# Patient Record
Sex: Female | Born: 1935 | Race: Black or African American | Hispanic: No | Marital: Single | Smoking: Never smoker
Health system: Southern US, Community
[De-identification: ages and names within clinical notes are randomized; demographics above are authoritative.]

## PROBLEM LIST (undated history)

## (undated) DIAGNOSIS — E1129 Type 2 diabetes mellitus with other diabetic kidney complication: Secondary | ICD-10-CM

## (undated) DIAGNOSIS — M1990 Unspecified osteoarthritis, unspecified site: Secondary | ICD-10-CM

## (undated) DIAGNOSIS — R0989 Other specified symptoms and signs involving the circulatory and respiratory systems: Secondary | ICD-10-CM

## (undated) DIAGNOSIS — R0600 Dyspnea, unspecified: Secondary | ICD-10-CM

## (undated) DIAGNOSIS — I2510 Atherosclerotic heart disease of native coronary artery without angina pectoris: Secondary | ICD-10-CM

## (undated) DIAGNOSIS — D631 Anemia in chronic kidney disease: Secondary | ICD-10-CM

## (undated) DIAGNOSIS — N189 Chronic kidney disease, unspecified: Secondary | ICD-10-CM

## (undated) DIAGNOSIS — G629 Polyneuropathy, unspecified: Secondary | ICD-10-CM

## (undated) DIAGNOSIS — R260 Ataxic gait: Secondary | ICD-10-CM

## (undated) DIAGNOSIS — I119 Hypertensive heart disease without heart failure: Secondary | ICD-10-CM

## (undated) DIAGNOSIS — F0390 Unspecified dementia without behavioral disturbance: Secondary | ICD-10-CM

## (undated) DIAGNOSIS — N186 End stage renal disease: Secondary | ICD-10-CM

## (undated) DIAGNOSIS — Z8489 Family history of other specified conditions: Secondary | ICD-10-CM

## (undated) DIAGNOSIS — I5032 Chronic diastolic (congestive) heart failure: Secondary | ICD-10-CM

## (undated) DIAGNOSIS — I10 Essential (primary) hypertension: Secondary | ICD-10-CM

## (undated) DIAGNOSIS — E785 Hyperlipidemia, unspecified: Secondary | ICD-10-CM

## (undated) DIAGNOSIS — Z992 Dependence on renal dialysis: Secondary | ICD-10-CM

## (undated) DIAGNOSIS — D509 Iron deficiency anemia, unspecified: Secondary | ICD-10-CM

## (undated) HISTORY — PX: EYE SURGERY: SHX253

## (undated) HISTORY — DX: Iron deficiency anemia, unspecified: D50.9

## (undated) HISTORY — DX: Essential (primary) hypertension: I10

## (undated) HISTORY — PX: CATARACT EXTRACTION: SUR2

## (undated) HISTORY — PX: FEMUR FRACTURE SURGERY: SHX633

## (undated) HISTORY — DX: Polyneuropathy, unspecified: G62.9

---

## 2001-04-16 ENCOUNTER — Ambulatory Visit (HOSPITAL_COMMUNITY): Admission: RE | Admit: 2001-04-16 | Discharge: 2001-04-16 | Payer: Self-pay | Admitting: Internal Medicine

## 2001-04-16 ENCOUNTER — Encounter: Payer: Self-pay | Admitting: Internal Medicine

## 2003-08-04 ENCOUNTER — Ambulatory Visit (HOSPITAL_COMMUNITY): Admission: RE | Admit: 2003-08-04 | Discharge: 2003-08-04 | Payer: Self-pay | Admitting: Internal Medicine

## 2004-01-03 ENCOUNTER — Ambulatory Visit (HOSPITAL_COMMUNITY): Admission: RE | Admit: 2004-01-03 | Discharge: 2004-01-03 | Payer: Self-pay | Admitting: Internal Medicine

## 2005-01-02 ENCOUNTER — Emergency Department (HOSPITAL_COMMUNITY): Admission: EM | Admit: 2005-01-02 | Discharge: 2005-01-02 | Payer: Self-pay | Admitting: Emergency Medicine

## 2005-05-08 ENCOUNTER — Ambulatory Visit (HOSPITAL_COMMUNITY): Admission: RE | Admit: 2005-05-08 | Discharge: 2005-05-08 | Payer: Self-pay | Admitting: Internal Medicine

## 2005-06-04 ENCOUNTER — Ambulatory Visit (HOSPITAL_COMMUNITY): Admission: RE | Admit: 2005-06-04 | Discharge: 2005-06-04 | Payer: Self-pay | Admitting: Internal Medicine

## 2005-09-05 ENCOUNTER — Ambulatory Visit (HOSPITAL_COMMUNITY): Admission: RE | Admit: 2005-09-05 | Discharge: 2005-09-05 | Payer: Self-pay | Admitting: Internal Medicine

## 2006-03-31 HISTORY — PX: CORONARY ARTERY BYPASS GRAFT: SHX141

## 2006-04-23 ENCOUNTER — Ambulatory Visit (HOSPITAL_COMMUNITY): Admission: RE | Admit: 2006-04-23 | Discharge: 2006-04-23 | Payer: Self-pay | Admitting: Internal Medicine

## 2006-08-03 ENCOUNTER — Emergency Department (HOSPITAL_COMMUNITY): Admission: EM | Admit: 2006-08-03 | Discharge: 2006-08-03 | Payer: Self-pay | Admitting: Emergency Medicine

## 2006-09-03 ENCOUNTER — Encounter (HOSPITAL_COMMUNITY): Admission: RE | Admit: 2006-09-03 | Discharge: 2006-10-03 | Payer: Self-pay | Admitting: Orthopaedic Surgery

## 2006-10-05 ENCOUNTER — Encounter (HOSPITAL_COMMUNITY): Admission: RE | Admit: 2006-10-05 | Discharge: 2006-11-04 | Payer: Self-pay | Admitting: Orthopaedic Surgery

## 2006-11-23 ENCOUNTER — Encounter (HOSPITAL_COMMUNITY): Admission: RE | Admit: 2006-11-23 | Discharge: 2006-12-23 | Payer: Self-pay | Admitting: Orthopaedic Surgery

## 2007-01-11 ENCOUNTER — Ambulatory Visit (HOSPITAL_COMMUNITY): Admission: RE | Admit: 2007-01-11 | Discharge: 2007-01-11 | Payer: Self-pay | Admitting: *Deleted

## 2007-01-20 ENCOUNTER — Ambulatory Visit: Payer: Self-pay | Admitting: Thoracic Surgery (Cardiothoracic Vascular Surgery)

## 2007-01-22 ENCOUNTER — Ambulatory Visit: Payer: Self-pay | Admitting: Surgery

## 2007-01-22 ENCOUNTER — Inpatient Hospital Stay (HOSPITAL_COMMUNITY): Admission: AD | Admit: 2007-01-22 | Discharge: 2007-01-26 | Payer: Self-pay | Admitting: *Deleted

## 2007-02-19 ENCOUNTER — Encounter: Admission: RE | Admit: 2007-02-19 | Discharge: 2007-02-19 | Payer: Self-pay | Admitting: Cardiothoracic Surgery

## 2007-02-19 ENCOUNTER — Ambulatory Visit: Payer: Self-pay | Admitting: Cardiothoracic Surgery

## 2007-03-01 ENCOUNTER — Encounter (HOSPITAL_COMMUNITY): Admission: RE | Admit: 2007-03-01 | Discharge: 2007-03-31 | Payer: Self-pay | Admitting: *Deleted

## 2007-03-15 ENCOUNTER — Ambulatory Visit (HOSPITAL_COMMUNITY): Admission: RE | Admit: 2007-03-15 | Discharge: 2007-03-15 | Payer: Self-pay | Admitting: Internal Medicine

## 2007-03-29 ENCOUNTER — Encounter (HOSPITAL_COMMUNITY): Admission: RE | Admit: 2007-03-29 | Discharge: 2007-03-31 | Payer: Self-pay | Admitting: Orthopaedic Surgery

## 2007-04-02 ENCOUNTER — Encounter (HOSPITAL_COMMUNITY): Admission: RE | Admit: 2007-04-02 | Discharge: 2007-05-02 | Payer: Self-pay | Admitting: *Deleted

## 2007-04-05 ENCOUNTER — Encounter (HOSPITAL_COMMUNITY): Admission: RE | Admit: 2007-04-05 | Discharge: 2007-05-05 | Payer: Self-pay | Admitting: Orthopaedic Surgery

## 2007-05-03 ENCOUNTER — Encounter (HOSPITAL_COMMUNITY): Admission: RE | Admit: 2007-05-03 | Discharge: 2007-06-02 | Payer: Self-pay | Admitting: *Deleted

## 2007-05-11 ENCOUNTER — Encounter (HOSPITAL_COMMUNITY): Admission: RE | Admit: 2007-05-11 | Discharge: 2007-06-10 | Payer: Self-pay | Admitting: Orthopaedic Surgery

## 2007-06-14 ENCOUNTER — Encounter (HOSPITAL_COMMUNITY): Admission: RE | Admit: 2007-06-14 | Discharge: 2007-07-14 | Payer: Self-pay | Admitting: Orthopaedic Surgery

## 2007-07-21 ENCOUNTER — Encounter (HOSPITAL_COMMUNITY): Admission: RE | Admit: 2007-07-21 | Discharge: 2007-08-20 | Payer: Self-pay | Admitting: Orthopaedic Surgery

## 2008-04-20 ENCOUNTER — Ambulatory Visit (HOSPITAL_COMMUNITY): Admission: RE | Admit: 2008-04-20 | Discharge: 2008-04-20 | Payer: Self-pay | Admitting: Internal Medicine

## 2008-06-12 ENCOUNTER — Ambulatory Visit (HOSPITAL_COMMUNITY): Admission: RE | Admit: 2008-06-12 | Discharge: 2008-06-12 | Payer: Self-pay | Admitting: Nephrology

## 2009-01-30 ENCOUNTER — Ambulatory Visit (HOSPITAL_COMMUNITY): Admission: RE | Admit: 2009-01-30 | Discharge: 2009-01-30 | Payer: Self-pay | Admitting: Cardiology

## 2009-07-23 ENCOUNTER — Ambulatory Visit (HOSPITAL_COMMUNITY): Admission: RE | Admit: 2009-07-23 | Discharge: 2009-07-23 | Payer: Self-pay | Admitting: Internal Medicine

## 2009-08-23 ENCOUNTER — Ambulatory Visit (HOSPITAL_COMMUNITY): Admission: RE | Admit: 2009-08-23 | Discharge: 2009-08-23 | Payer: Self-pay | Admitting: Internal Medicine

## 2009-09-06 ENCOUNTER — Ambulatory Visit (HOSPITAL_COMMUNITY): Payer: Self-pay | Admitting: Oncology

## 2009-09-06 ENCOUNTER — Encounter (HOSPITAL_COMMUNITY): Admission: RE | Admit: 2009-09-06 | Discharge: 2009-10-06 | Payer: Self-pay | Admitting: Oncology

## 2009-10-15 ENCOUNTER — Encounter (HOSPITAL_COMMUNITY): Admission: RE | Admit: 2009-10-15 | Discharge: 2009-11-14 | Payer: Self-pay | Admitting: Oncology

## 2009-10-22 ENCOUNTER — Ambulatory Visit (HOSPITAL_COMMUNITY): Payer: Self-pay | Admitting: Oncology

## 2009-12-12 ENCOUNTER — Ambulatory Visit (HOSPITAL_COMMUNITY): Payer: Self-pay | Admitting: Oncology

## 2010-01-02 ENCOUNTER — Encounter (HOSPITAL_COMMUNITY)
Admission: RE | Admit: 2010-01-02 | Discharge: 2010-02-01 | Payer: Self-pay | Source: Home / Self Care | Admitting: Oncology

## 2010-02-04 ENCOUNTER — Encounter (HOSPITAL_COMMUNITY)
Admission: RE | Admit: 2010-02-04 | Discharge: 2010-03-06 | Payer: Self-pay | Source: Home / Self Care | Admitting: Oncology

## 2010-02-04 ENCOUNTER — Ambulatory Visit (HOSPITAL_COMMUNITY): Payer: Self-pay | Admitting: Oncology

## 2010-02-18 ENCOUNTER — Encounter (HOSPITAL_COMMUNITY)
Admission: RE | Admit: 2010-02-18 | Discharge: 2010-03-20 | Payer: Self-pay | Source: Home / Self Care | Attending: Nephrology | Admitting: Nephrology

## 2010-02-18 ENCOUNTER — Encounter: Payer: Self-pay | Admitting: Cardiovascular Disease

## 2010-02-19 ENCOUNTER — Ambulatory Visit (HOSPITAL_COMMUNITY): Payer: Self-pay | Admitting: Nephrology

## 2010-02-20 ENCOUNTER — Encounter: Payer: Self-pay | Admitting: Cardiovascular Disease

## 2010-02-20 ENCOUNTER — Ambulatory Visit: Payer: Self-pay

## 2010-03-21 ENCOUNTER — Ambulatory Visit (HOSPITAL_COMMUNITY): Payer: Self-pay | Admitting: Oncology

## 2010-03-21 ENCOUNTER — Encounter (HOSPITAL_COMMUNITY)
Admission: RE | Admit: 2010-03-21 | Discharge: 2010-04-20 | Payer: Self-pay | Source: Home / Self Care | Attending: Oncology | Admitting: Oncology

## 2010-04-17 LAB — CBC
HCT: 32.6 % — ABNORMAL LOW (ref 36.0–46.0)
Hemoglobin: 10.5 g/dL — ABNORMAL LOW (ref 12.0–15.0)
MCH: 26.3 pg (ref 26.0–34.0)
MCHC: 32.2 g/dL (ref 30.0–36.0)
MCV: 81.7 fL (ref 78.0–100.0)
Platelets: 181 10*3/uL (ref 150–400)
RBC: 3.99 MIL/uL (ref 3.87–5.11)
RDW: 18.1 % — ABNORMAL HIGH (ref 11.5–15.5)
WBC: 5.4 10*3/uL (ref 4.0–10.5)

## 2010-04-21 ENCOUNTER — Encounter: Payer: Self-pay | Admitting: Internal Medicine

## 2010-04-29 ENCOUNTER — Encounter (HOSPITAL_COMMUNITY)
Admission: RE | Admit: 2010-04-29 | Discharge: 2010-04-30 | Payer: Self-pay | Source: Home / Self Care | Attending: Oncology | Admitting: Oncology

## 2010-04-30 NOTE — Miscellaneous (Signed)
Summary: Orders Update  Clinical Lists Changes  Problems: Added new problem of CHRONIC KIDNEY DISEASE UNSPECIFIED (ICD-585.9) Orders: Added new Test order of Renal Artery Duplex (Renal Artery Duplex) - Signed

## 2010-05-13 ENCOUNTER — Encounter (HOSPITAL_COMMUNITY): Payer: Medicare Other | Admitting: Oncology

## 2010-05-13 ENCOUNTER — Encounter (HOSPITAL_COMMUNITY): Payer: Medicare Other | Attending: Oncology

## 2010-05-13 DIAGNOSIS — I10 Essential (primary) hypertension: Secondary | ICD-10-CM | POA: Insufficient documentation

## 2010-05-13 DIAGNOSIS — E119 Type 2 diabetes mellitus without complications: Secondary | ICD-10-CM | POA: Insufficient documentation

## 2010-05-13 DIAGNOSIS — E7800 Pure hypercholesterolemia, unspecified: Secondary | ICD-10-CM | POA: Insufficient documentation

## 2010-05-13 DIAGNOSIS — D638 Anemia in other chronic diseases classified elsewhere: Secondary | ICD-10-CM

## 2010-05-13 DIAGNOSIS — Z79899 Other long term (current) drug therapy: Secondary | ICD-10-CM | POA: Insufficient documentation

## 2010-05-27 ENCOUNTER — Ambulatory Visit (HOSPITAL_COMMUNITY): Payer: Medicare Other

## 2010-05-27 DIAGNOSIS — E119 Type 2 diabetes mellitus without complications: Secondary | ICD-10-CM

## 2010-05-27 DIAGNOSIS — I10 Essential (primary) hypertension: Secondary | ICD-10-CM

## 2010-05-27 DIAGNOSIS — D638 Anemia in other chronic diseases classified elsewhere: Secondary | ICD-10-CM

## 2010-06-10 ENCOUNTER — Ambulatory Visit (HOSPITAL_COMMUNITY): Payer: Medicare Other

## 2010-06-10 DIAGNOSIS — E119 Type 2 diabetes mellitus without complications: Secondary | ICD-10-CM

## 2010-06-10 DIAGNOSIS — D638 Anemia in other chronic diseases classified elsewhere: Secondary | ICD-10-CM

## 2010-06-10 LAB — CBC
HCT: 30.4 % — ABNORMAL LOW (ref 36.0–46.0)
MCHC: 31.9 g/dL (ref 30.0–36.0)
MCV: 80.9 fL (ref 78.0–100.0)
RDW: 18 % — ABNORMAL HIGH (ref 11.5–15.5)

## 2010-06-11 LAB — CBC
MCH: 25.1 pg — ABNORMAL LOW (ref 26.0–34.0)
MCHC: 31.6 g/dL (ref 30.0–36.0)
Platelets: 195 10*3/uL (ref 150–400)
RBC: 3.88 MIL/uL (ref 3.87–5.11)
RDW: 19.7 % — ABNORMAL HIGH (ref 11.5–15.5)

## 2010-06-13 LAB — RETICULOCYTES
RBC.: 4.24 MIL/uL (ref 3.87–5.11)
Retic Count, Absolute: 63.6 10*3/uL (ref 19.0–186.0)
Retic Ct Pct: 1.5 % (ref 0.4–3.1)

## 2010-06-13 LAB — CBC
Platelets: 264 10*3/uL (ref 150–400)
RDW: 19.4 % — ABNORMAL HIGH (ref 11.5–15.5)
WBC: 6 10*3/uL (ref 4.0–10.5)

## 2010-06-14 LAB — RETICULOCYTES
RBC.: 3.87 MIL/uL (ref 3.87–5.11)
Retic Ct Pct: 0.9 % (ref 0.4–3.1)

## 2010-06-14 LAB — CBC
MCHC: 31.8 g/dL (ref 30.0–36.0)
RDW: 16.8 % — ABNORMAL HIGH (ref 11.5–15.5)

## 2010-06-15 LAB — CBC
MCH: 26.2 pg (ref 26.0–34.0)
MCHC: 31.7 g/dL (ref 30.0–36.0)
MCV: 82.6 fL (ref 78.0–100.0)
Platelets: 219 10*3/uL (ref 150–400)

## 2010-06-15 LAB — RETICULOCYTES: Retic Ct Pct: 1.3 % (ref 0.4–3.1)

## 2010-06-17 LAB — CBC
HCT: 26.7 % — ABNORMAL LOW (ref 36.0–46.0)
Hemoglobin: 8.7 g/dL — ABNORMAL LOW (ref 12.0–15.0)
MCHC: 32.5 g/dL (ref 30.0–36.0)
MCV: 82.8 fL (ref 78.0–100.0)
RBC: 3.23 MIL/uL — ABNORMAL LOW (ref 3.87–5.11)
RDW: 16.1 % — ABNORMAL HIGH (ref 11.5–15.5)

## 2010-06-17 LAB — COMPREHENSIVE METABOLIC PANEL
BUN: 21 mg/dL (ref 6–23)
CO2: 27 mEq/L (ref 19–32)
Calcium: 8.9 mg/dL (ref 8.4–10.5)
Creatinine, Ser: 1.56 mg/dL — ABNORMAL HIGH (ref 0.4–1.2)
GFR calc non Af Amer: 32 mL/min — ABNORMAL LOW (ref >60)
Glucose, Bld: 192 mg/dL — ABNORMAL HIGH (ref 70–99)
Sodium: 138 mEq/L (ref 135–145)
Total Protein: 6.5 g/dL (ref 6.0–8.3)

## 2010-06-17 LAB — PROTEIN ELECTROPHORESIS, SERUM
Alpha-1-Globulin: 5.1 % — ABNORMAL HIGH (ref 2.9–4.9)
Alpha-2-Globulin: 8.9 % (ref 7.1–11.8)
Beta Globulin: 7.5 % — ABNORMAL HIGH (ref 4.7–7.2)
M-Spike, %: NOT DETECTED g/dL
Total Protein ELP: 6.8 g/dL (ref 6.0–8.3)

## 2010-06-17 LAB — RETICULOCYTES
RBC.: 3.23 MIL/uL — ABNORMAL LOW (ref 3.87–5.11)
Retic Count, Absolute: 35.5 10*3/uL (ref 19.0–186.0)
Retic Ct Pct: 1.1 % (ref 0.4–3.1)

## 2010-06-17 LAB — CREATININE CLEARANCE, URINE, 24 HOUR
Collection Interval-CRCL: 24 hours
Creatinine Clearance: 71 mL/min — ABNORMAL LOW (ref 75–115)
Creatinine, 24H Ur: 1593 mg/d (ref 700–1800)
Creatinine: 1.56 mg/dL — ABNORMAL HIGH (ref 0.4–1.2)

## 2010-06-17 LAB — PROTEIN, URINE, 24 HOUR: Collection Interval-UPROT: 24 hours

## 2010-06-24 ENCOUNTER — Ambulatory Visit (HOSPITAL_COMMUNITY): Payer: Medicare Other

## 2010-07-08 ENCOUNTER — Encounter (HOSPITAL_COMMUNITY): Payer: Medicare Other

## 2010-07-08 ENCOUNTER — Encounter (HOSPITAL_COMMUNITY): Payer: Medicare Other | Attending: Oncology

## 2010-07-08 DIAGNOSIS — I10 Essential (primary) hypertension: Secondary | ICD-10-CM | POA: Insufficient documentation

## 2010-07-08 DIAGNOSIS — E7800 Pure hypercholesterolemia, unspecified: Secondary | ICD-10-CM | POA: Insufficient documentation

## 2010-07-08 DIAGNOSIS — Z79899 Other long term (current) drug therapy: Secondary | ICD-10-CM | POA: Insufficient documentation

## 2010-07-08 DIAGNOSIS — D638 Anemia in other chronic diseases classified elsewhere: Secondary | ICD-10-CM | POA: Insufficient documentation

## 2010-07-08 DIAGNOSIS — D649 Anemia, unspecified: Secondary | ICD-10-CM

## 2010-07-08 DIAGNOSIS — E119 Type 2 diabetes mellitus without complications: Secondary | ICD-10-CM | POA: Insufficient documentation

## 2010-08-05 ENCOUNTER — Other Ambulatory Visit (HOSPITAL_COMMUNITY): Payer: Medicare Other

## 2010-08-05 ENCOUNTER — Ambulatory Visit (HOSPITAL_COMMUNITY): Payer: Medicare Other

## 2010-08-13 NOTE — Assessment & Plan Note (Signed)
OFFICE VISIT   LLANA, ZHOU D  DOB:  Jan 14, 1931                                        February 19, 2007  CHART #:  [id number]   SUBJECTIVE:  The patient presents today for followup.  She is status  post coronary artery bypass grafting x5 on 01/23/2007, performed by Dr.  Roxan Hockey.  She has done well since her discharge home.  She has no  specific complaints today.  She is progressing with her activity and is  scheduled to start cardiac rehab the first week in December.  She has  seen Dr. Mathis Bud in followup, and no changes were made in her  medication.   PHYSICAL EXAMINATION:  Vital signs:  Blood pressure 145/81, pulse is 80,  respirations 18, O2 sat 97% on room air.  Sternal and leg incisions are  healing well without erythema or drainage.  Heart:  Regular rate and  rhythm without murmurs, rubs, or gallops.  Lungs:  Clear to  auscultation.  Extremities:  No edema.   Chest x-ray shows no significant abnormalities with resolved effusions.   ASSESSMENT AND PLAN:  The patient is status post coronary artery bypass  grafting on 01/23/2007 and is recovering nicely.  She was seen by Dr.  Prescott Gum today in followup, and has been counseled on continued sternal  precautions until the first of January.  She may drive and lift up to 15  pounds at this point.  She will followup in 2 to 3 months with Dr.  Mathis Bud as scheduled, and will see [place] back on a p.r.n. basis.   Ivin Poot, M.D.  Electronically Signed   GC/MEDQ  D:  02/19/2007  T:  02/20/2007  Job:  [id number]   cc:   [facility] Office  Mathis Bud, MD  Tesfaye D. Legrand Rams, MD

## 2010-08-13 NOTE — Discharge Summary (Signed)
[name], [name]             ACCOUNT NO.:  [id number]   MEDICAL RECORD NO.:  [id number]          PATIENT TYPE:  INP   LOCATION:  2017                         FACILITY:  [facility]   PHYSICIAN:  Revonda Standard. Roxan Hockey, M.D.DATE OF BIRTH:  12-20-1930   DATE OF ADMISSION:  01/20/2007  DATE OF DISCHARGE:  01/26/2007                               DISCHARGE SUMMARY   HISTORY OF PRESENT ILLNESS:  The patient is a 77 year old female with  multiple medical problems who has recently been evaluated by Dr.  Mathis Bud for coronary artery disease.  A year ago she had normal left  ventricular size and function on echocardiogram.  More recently, she had  a Persantine Myoview which revealed a high risk with mild to moderate  ischemia in the anterior anterolateral walls.  Ejection fraction was  diminished and calculated at 37%.  This was a significant change from  her previous scan.  She also is known to have peripheral artery disease  and has severe occlusive disease in the bilateral posterior tibial  arteries, and a high-grade stenosis of the left anterior tibial artery.  Due to these findings, she was recommended to proceed with cardiac  catheterization and was admitted this hospitalization for that  procedure.   ALLERGIES:  NO KNOWN ALLERGIES.   MEDICATIONS PRIOR TO ADMISSION:  Include the following:  1. Tramadol b.i.d. p.r.n.  2. Metformin 500 b.i.d.  3. Aspirin 81 mg daily.  4. Glipizide 2.5 mg daily.  5. Imodium p.r.n.  6. Aleve p.r.n.  7. Gabapentin 300 mg q.h.s. p.r.n.  8. Allegra 180 mg p.r.n.  9. Lovastatin 20 mg at bedtime.  10.Enablex 15 mg daily.  11.Motrin p.r.n.  12.Hyzaar 100/25 mg daily.   PAST MEDICAL HISTORY:  Includes:  1. Diabetes mellitus.  2. Hypertension.  3. Hyperlipidemia.  4. Peripheral neuropathy.  5. Peripheral atrial occlusive disease.  6. Extracranial cerebrovascular occlusive disease.   For family history, social history, review of systems, and physical  exam, please see the history and physical done at the time of admission.   HOSPITAL COURSE:  The patient was admitted.  She was taken to the  cardiac catheterization lab and found to have severe 3-vessel coronary  disease with preserved left ventricular systolic function.  Cardiac  surgical consultation was then obtained with Modesto Charon, M.D.,  who evaluated the patient and studies denies recommended proceeding with  surgical revascularization.   PROCEDURE:  On January 22, 2007, the patient was taken to the operating  room.  At which time she underwent the following procedure:  Coronary  artery bypass grafting x5.  The following grafts were placed:  1. Left internal mammary artery to the LAD.  2. Sequential saphenous vein graft to the obtuse marginal 1 and obtuse      marginal 2.  3. Saphenous vein graft to the diagonal coronary artery.  4. A saphenous vein graft to the distal right coronary artery.   The patient tolerated the procedure well and was taken to the surgical  intensive care unit in stable condition.   POSTOPERATIVE HOSPITAL COURSE:  The patient has overall done quite well.  She has maintained stable hemodynamics.  She has been neurologically  intact.  All routine lines, monitors, drainage devices were discontinued  in the standard fashion.  She initially was treated with the Glucomander  and Lantus insulin protocols.  She has subsequently been converted back  to her oral regimen with adequate control.  She has maintained normal  sinus rhythm without cardiac dysrhythmias or ectopy.  She does have a  moderate postoperative acute blood loss anemia.  Her most recent  hematocrit is 29% on January 25, 2007.  Electrolytes, BUN, and  creatinine are within normal limits.  She has required some potassium  supplementation.  Her most recent BUN and creatinine dated January 26, 2007 are 24 and 1.33 respectively.  Oxygen has been weaned and she  maintained adequate  saturations on room air.  Her incisions are all  healing well without evidence of infection.  She is tolerating cardiac  rehabilitation phase with modality using standard protocols.  Overall,  she is felt to be stable for discharge on today's date, January 26, 2007.   CONDITION ON DISCHARGE:  Stable and improving.   FINAL DIAGNOSES:  1. Severe 3-vessel coronary artery disease, now status post surgical      revascularization as described.  2. Diabetes mellitus.  3. Hypertension.  4. Hyperlipidemia.  5. Peripheral neuropathy.  6. Peripheral atrial occlusive disease.  7. Extracranial cerebrovascular occlusive disease.   INSTRUCTIONS:  The patient will receive written instructions regarding  medications, activity, diet, wound care, and follow up.  Follow up will  include Dr. Prescott Gum for Dr. Roxan Hockey on February 19, 2007.  Additionally, she is instructed to have an appointment at the cardiology  office in 2 weeks.   MEDICATIONS AT TIME OF DISCHARGE:  1. Glipizide XL 2.5 mg p.o. daily.  2. Metformin 500 mg p.o. twice daily.  3. Lovastatin 20 mg daily.  4. Gabapentin 300 mg daily.  5. Amlodipine 5 mg daily.  6. Detrol LA 4 mg daily.  7. Motrin IV p.r.n.  8. Glucose tablets p.r.n.  9. Aspirin 81 mg daily.  10.Hyzaar 50/12.5 mg daily.  11.Aleve 200 mg two tablets daily p.r.n.  12.Lopressor 25 mg twice daily.  13.Catapres patch 0.2 topically q.7 days.  14.Lasix 40 mg daily for 5 days.  15.K-Dur 40 mEq daily for 5 days.  16.For pain, oxycodone 5 mg one to two tablets every 4 to 6 hours as      needed.      [name], P.A.-C.      Revonda Standard Roxan Hockey, M.D.  Electronically Signed    WEG/MEDQ  D:  01/26/2007  T:  01/26/2007  Job:  [id number]   cc:   Tesfaye D. Legrand Rams, MD  Leslye Peer, MD

## 2010-08-13 NOTE — Op Note (Signed)
NAMENARDIA, GAJEWSKI             ACCOUNT NO.:  [id number]   MEDICAL RECORD NO.:  [id number]          PATIENT TYPE:  INP   LOCATION:  2314                         FACILITY:  [facility]   PHYSICIAN:  Leda Quail, M.D.        DATE OF BIRTH:  18-Jan-1931   DATE OF PROCEDURE:  01/22/2007  DATE OF DISCHARGE:                               OPERATIVE REPORT   Ms. Goe is an elderly female with a significant coronary artery  disease, scheduled for coronary artery bypass grafting.  TEE will be  used to assess left ventricular function as well as mitral valve  function during the coronary artery bypass grafting.   After induction of general anesthesia, the airway was secured with an  oral endotracheal tube.  An oral gastric tube was placed to drain  gastric contents and then removed.  The transesophageal transducer was  heavily lubricated, passed blindly down the oropharynx with no  resistance.  It remained in the neutral, unflexed position throughout  the procedure.  At the completion of the case the probe was removed.  There was no evidence oral or pharyngeal damage.  The prebypass  examination revealed the left ventricle to be overall slightly  thickened.  There was a global hypokinesis but no segmental defects  detected.  Left atrium was normal size.  The atrial appendage was clean.  The atrial septum was intact.  Mitral valve had two leaflets, opened and  closed appropriately, slightly myxomatous in appearance.  Color Doppler  did reveal a 1+ central mitral regurgitant pattern.  The aortic valve  had three leaflets.  No calcification was noted.  All leaflets were  noted to be opening and closing appropriately.  Color Doppler revealed  no aortic insufficiency.  The right heart exam was essentially normal  with Swan-Ganz catheter noted across the tricuspid valve, which was  exhibiting trace tricuspid insufficiency.  The patient was placed on  cardiopulmonary bypass and underwent coronary artery  bypass grafting at  the completion of the bypass period.  The left ventricle was noted to be  slightly sluggish.  This improved with time after bypass to the  prebypass state of just being generally globally hypokinetic.  Again,  post bypass there was no left ventricular segmental defect detected and  the valvular examinations were unchanged from the prebypass period.           ______________________________  Leda Quail, M.D.     LK/MEDQ  D:  01/22/2007  T:  01/23/2007  Job:  [id number]

## 2010-08-13 NOTE — Consult Note (Signed)
[name], CREPPEL             ACCOUNT NO.:  [id number]   MEDICAL RECORD NO.:  [id number]          PATIENT TYPE:  INP   LOCATION:  2314                         FACILITY:  [facility]   PHYSICIAN:  Revonda Standard. Roxan Hockey, M.D.DATE OF BIRTH:  [birth year]   DATE OF CONSULTATION:  01/21/2007  DATE OF DISCHARGE:                                 CONSULTATION   REASON FOR CONSULTATION:  Severe three-vessel coronary disease.   HISTORY OF PRESENT ILLNESS:  [name] is a 77 year old woman with  multiple cardiac risk factors including diabetes, hypertension,  hyperlipidemia and peripheral vascular disease.  She was recently having  exertional chest pain.  She described this is a dull, aching pain  substernally that occurred with exertion.  She also would get short of  breath when she had the chest pain, occasionally would be mildly sweaty  but no nausea or vomiting, did resolve with rest.  She had markedly cut  back her level of exertion and essentially avoided walking any  significant distance because of this discomfort, and she was referred to  Dr. Mliss Sax, and a Persantine Myoview scan was done.  It showed a  mild to moderate perfusion defect in the apical basal anterior mid  anterior and apical anterior walls as well as basal anterior lateral and  mid anterolateral walls.  Ejection fraction was 37% post stress.  The  patient today had cardiac catheterization which showed severe three-  vessel coronary disease with a multiple 90-95% stenoses in her LAD as  well as the takeoff of first diagonal 95% in obtuse marginal 1, total  occlusion of her circumflex with filling of her distal obtuse marginals  via right to left collaterals.  There also was a 95% proximal right  stenosis.  Proximal coronaries were heavily calcified.  Her ejection  fraction was normal with some mild apical hypokinesis but overall  ejection fraction of approximately 75%.  The patient currently is pain  free.   PAST  MEDICAL HISTORY:  Significant for:  1. Type 2 diabetes non-insulin-dependent.  2. Peripheral vascular disease.  3. Hypertension.  4. Hyperlipidemia.  5. Peripheral neuropathy.   Her current medications:  1. Ultram p.r.n.  2. Glipizide 2.5 mg p.o. daily.  3. Metformin 500 mg p.o. b.i.d.  4. Lovastatin 20 mg p.o. daily.  5. Gabapentin 300 mg every night.  6. Amlodipine 5 mg daily.  7. Detrol LA 4 mg daily.  8. Motrin IB 200 mg as needed.  9. Aspirin 81 mg daily.  10.Hyzaar 50/12.5 daily.  She also uses Aleve daily.   She has no known drug allergies.   FAMILY HISTORY:  Significant for hypertension and vascular disease.   SOCIAL HISTORY:  The patient lives alone, but she does have a nephew in  town who is very close her.  She does not have any other family in the  area.   REVIEW OF SYSTEMS:  Chest pain and shortness of breath on exertion.  She  does have three-pillow orthopnea which she has had for many years.  No  paroxysmal nocturnal dyspnea.  She has to urinate frequently.  She  does  not note any pain, discoloration of the urine or blood in her urine.  She has not noted any bloody or tarry stools or any significant  indigestion recently.  She denies cough.  All other systems are  negative.   PHYSICAL EXAMINATION:  Mr. Recalde is a 77 year old, African-American  female in no acute distress.  Her blood pressure is 165/80, pulse 72 and  regular, respirations are 16.  In general, she is well developed and well nourished and in no acute  distress.  NEUROLOGICALLY:  She is alert and oriented x3, appropriate and grossly  intact.  Her HEENT exam:  She has a soft tissue tumor on her forehead.  Good  dentition.  NECK:  Is supple without thyromegaly, adenopathy, bruits or jugular  venous distention.  CARDIAC EXAM:  Has a 2/6 systolic murmur.  Regular rate and rhythm and a  normal S1-S2.  Her lungs are clear with equal breath sounds bilaterally.  ABDOMEN:  Soft and nontender with  positive bowel sounds.  EXTREMITIES:  Without clubbing, cyanosis or edema.  She has 2+ radial  pulses bilaterally.  I am not able to palpate pedal pulses on either  side.  She has no significant peripheral edema.   LABORATORY DATA:  Cardiac catheterization as dictated.  Peripheral  artery angiography revealed significant stenoses with posterior tibial  occlusion bilaterally and significant anterior tibial and peroneal  disease bilaterally.  There was no significant disease above the knees.  She has carotid duplex on October 8 which showed 0-49% plaque in the  right internal carotid.  There was no plaque in the left internal  carotid.  Her white count was 6, hematocrit was 29 with a hemoglobin of  9.8, platelet count was 229.  PT 12.6, PTT 29.  BUN and creatinine 19  and 1.25.  Her creatinine was 1.22 on  September 08, 2005.  Urinalysis did  show small amount of glucose, trace bilirubin, small leukocyte esterase.  There were casts and white blood cells and many bacteria.  She has been  treated with Cipro for that.  We will repeat her urinalysis today.   IMPRESSION:  [name] is a 77 year old African-American female with  severe three-vessel disease with exertional chest pain and a markedly  positive Persantine Myoview.  Coronary artery bypass grafting is  indicated for survival benefit as well as relief of symptoms and  hopefully return to more active lifestyle.  She does have other issues,  a UTI which has been treated as well as anemia and a questionable lung  nodule on chest x-ray.  The anemia and possible lung nodule both can be  worked up postoperatively.  Neither would change her need for coronary  bypass grafting, and there is not sufficient time to correct the anemia  prior to surgery given the critical severity of her coronary lesions.  I  have discussed in detail with Ms. Bowlby and her nephew the  indications, risks, benefits and alternatives.  They understand that the  risks  include but are not limited to death, stroke, MI, DVT, PE,  bleeding, possible need for transfusions, infection as well as other  organ system dysfunction including respiratory, renal, lymphatic or GI  complications.  She understands and accepts these risks and agrees to  proceed.   We also discussed expected outcomes, expected length of stay and overall  recovery, and she will plan to go home with her nephew initially after  the surgery until she can be independent once  again.   Again, we will plan surgery first case tomorrow morning.      Revonda Standard Roxan Hockey, M.D.  Electronically Signed     SCH/MEDQ  D:  01/21/2007  T:  01/22/2007  Job:  [id number]   cc:   Leslye Peer, MD  Tesfaye D. Legrand Rams, MD

## 2010-08-13 NOTE — Op Note (Signed)
[name], [name]             ACCOUNT NO.:  [id number]   MEDICAL RECORD NO.:  [id number]          PATIENT TYPE:  INP   LOCATION:  2314                         FACILITY:  [facility]   PHYSICIAN:  Revonda Standard. Roxan Hockey, M.D.DATE OF BIRTH:  12/07/1930   DATE OF PROCEDURE:  01/22/2007  DATE OF DISCHARGE:                               OPERATIVE REPORT   PREOPERATIVE DIAGNOSIS:  Severe three-vessel disease with exertional  angina.   POSTOPERATIVE DIAGNOSIS:  Severe three-vessel disease with exertional  angina.   PROCEDURE:  Median sternotomy extracorporeal circulation coronary bypass  grafting x5, (left internal mammary artery to the left anterior  descending, saphenous vein graft to first diagonal, sequential saphenous  vein graft to obtuse marginal 1 and 2, saphenous vein graft distal right  coronary) and endoscopic vein harvest, both thighs.   SURGEON:  Revonda Standard. Roxan Hockey, M.D.   ASSISTANT:  Ivin Poot, M.D.   SECOND ASSISTANT:  [name], P.A.-C.   ANESTHESIA:  General.   FINDINGS:  Diffusely diseased vessels but all suitable for bypass  grafting.  Saphenous vein from the thighs of acceptable quality.  Below  the knee on the right saphenous vein was extremely small and not usable.  Transesophageal echocardiography revealed mild global hypokinesis with  1+ mitral insufficiency.  Both pre and post bypass.   CLINICAL NOTE:  [name] is a 77 year old woman with exertional  angina.  She had a positive Persantine Cardiolite.  She also has  significant cardiac risk factors including diabetes, hyperlipidemia,  hypertension, and peripheral arterial disease.  At catheterization she  had severe three-vessel coronary disease.  Her circumflex was totally  occluded to 90% stenoses in the LAD and 95% stenosis in the proximal  right coronary. Ejection fraction was well-preserved in contrast to her  ejection fraction by Persantine Cardiolite which was only 37%.  The  patient  was advised to undergo coronary bypass grafting.  Indications,  risks, benefits, and alternatives were discussed in detail with the  patient. Her nephew was present during the discussion. She understood,  accepted risks and agreed to proceed.   OPERATIVE NOTE:  [name] was brought to the preop holding area on  January 22, 2007.  There lines were placed by anesthesia for monitoring  arterial, central venous, and pulmonary arterial pressure.  Intravenous  antibiotics were administered.  She was taken to the operating room,  anesthetized and intubated.  A Foley catheter was placed.  Transesophageal echocardiography was performed which showed mild global  hypokinesis.  There was mild mitral insufficiency.  The chest, abdomen  and legs were prepped and draped in usual fashion.  Incision in the  medial aspect of the right leg at the level of the knee and the greater  saphenous vein was harvested endoscopically from the right groin to mid  calf.  Below-the-knee bridged incisions had to be made, but the vessel  became very small after removing the vessel from the leg and inspecting  the segment of saphenous vein below the knee was not usable conduit.  Therefore,  additional vein was harvested from the lower left thigh,  again  endoscopically.  Simultaneously a median sternotomy was performed  and there was sternal osteoporosis.  The left internal mammary artery  was harvested using standard technique. There was a good-quality vessel.  Five thousand units of heparin was administered during the vessel  harvest, the remaining full heparin dose was given prior to opening the  pericardium.   The pericardium was opened.  The ascending aorta was inspected.  There  was no evidence of atherosclerotic disease.  The aorta was cannulated  via concentric two Ethilon pledgeted pursestring sutures.  A dual stage  venous cannula was placed via pursestring suture in the atrial  appendage.  Cardiopulmonary  bypass instituted and the patient cooled to  32 degrees Celsius.  The coronary arteries were inspected and  anastomotic sites chosen.  The conduits were inspected and cut to  length.  A foam pad was placed in the pericardium to protect the left  phrenic nerve.  A temperature probe was placed in the myocardial septum  and a cardioplegic cannula placed in the ascending aorta.   The aorta was crossclamped, left ventricle was emptied via aortic root  vent.  Cardiac arrest was achieved with a combination of cold blood  cardioplegia and topical iced saline.  Total of 900 mL of cardioplegia  was administered.  Myocardial septal temperature felt at 9 degrees  Celsius.  The following distal anastomoses were performed:   First, a reverse saphenous vein graft was placed end-to-side to the  distal right coronary.  This was a 1.5 mm good quality target.  The vein  was of good quality.  The anastomosis was performed with running 7-0  Prolene suture.  There was excellent flow through the graft.  Cardioplegia was administered.  There was good hemostasis.   Next, a reverse saphenous vein graft sequentially to obtuse marginal 1  and 2.  Obtuse marginal 1 was an intramyocardial vessel, was identified  and arteriotomy was made.  There was plaque distal to the site of the  anastomosis.  The probe would not pass.  Therefore, the anastomosis was  not performed that site.  This area was oversewn.  The vessel was  dissected out more distally, opened and was a 1.5 mm vessel.  A side-to-  side anastomosis was performed at that site with a running 7-0 Prolene  suture.  End-to-side anastomosis was performed to obtuse marginal 2,  which was a 1.5 mm, diffusely diseased, fair-quality target and there  was good flow through the graft.  Cardioplegia was administered.  There  was good hemostasis.   Next, a reverse saphenous vein graft was placed end-to-side to the first  diagonal branch of the LAD.  This was also 1.5  mm diameter fair quality  target which was diffusely diseased with extensive plaquing. Vein graft  anastomosed end-to-side with running 7-0 Prolene suture.   Next, the left internal mammary artery was brought through a window in  the pericardium.  The distal end was beveled.  It was anastomosed end-to-  side to the distal LAD.  The LAD also had diffuse plaquing but a 1 mm  probe did pass to the apex.  The mammary was 2 mm good-quality conduit.  The anastomosis was performed with a running 8-0 Prolene suture.  At  completion of the mammary to LAD anastomosis, bulldog clamps briefly  removed to inspect for hemostasis.  Immediate rapid septal rewarming was  noted.  Bulldog clamps were placed.  The mammary pedicle was tacked to  the epicardial surface of  the heart with 6-0 Prolene sutures.   Additional cardioplegia was administered.  The vein grafts were cut to  length.  The proximal vein graft anastomoses then performed to 4.5 mm  punch aortotomies with running 6-0 Prolene sutures while under  crossclamp.  At completion of all proximal anastomoses, the patient was  placed in the Trendelenburg position.  Lidocaine was administered.  Bulldog clamp was again removed from the left mammary artery.  The  aortic root was de-aired and aortic crossclamp was removed.  Total  crossclamp time was 75 minutes.   The patient required a single defibrillation with 20 joules and was in  sinus bradycardia.  All proximal and distal anastomoses were inspected  for hemostasis and the epicardial pacing wires were placed on the right  ventricle and right atrium while the patient is being rewarmed.  When  she had rewarmed to a core temperature of 37 degrees Celsius, she was  weaned from cardiopulmonary bypass on the first attempt without  difficulty.  The total bypass time was 104 minutes.  The initial cardiac  index was greater than 2 liters per minute per meter squared.   Test dose protamine was administered  and was well tolerated.  The atrial  and aortic cannulae removed.  The remaining protamine was administered  without incident.  Chest irrigated with 1 L of warm normal saline and 1  gram of vancomycin.  Hemostasis was achieved.  Left pleural and two  mediastinal chest tubes were placed in separate subcostal incisions.  The pericardium was reapproximated with interrupted 3-0 silk sutures and  came together easily without tension.  Sternum was closed with  combination of single and double heavy gauge stainless steel wires.  Pectoralis fascia, subcutaneous tissue and skin were closed in standard  fashion.  All sponge, needle and instrument counts were correct at the  end of the procedure.  The patient did have a transient drop in her  cardiac output without other hemodynamic change after closure of the  chest.  Transesophageal echocardiography showed no significant wall  motion changes.  The patient responded to volume administration and  initiation of low-dose dopamine infusion.      Revonda Standard Roxan Hockey, M.D.  Electronically Signed     SCH/MEDQ  D:  01/22/2007  T:  01/24/2007  Job:  [id number]   cc:   Leslye Peer, MD  Tesfaye D. Legrand Rams, MD

## 2010-08-13 NOTE — Cardiovascular Report (Signed)
[name], [name]             ACCOUNT NO.:  [id number]   MEDICAL RECORD NO.:  [id number]          PATIENT TYPE:  AMB   LOCATION:  SDS                          FACILITY:  [facility]   PHYSICIAN:  Richard A. Rollene Fare, M.D.DATE OF BIRTH:  Mar 03, 1931   DATE OF PROCEDURE:  01/21/2007  DATE OF DISCHARGE:                            CARDIAC CATHETERIZATION   PROCEDURE:  Retrograde central aortic catheterization, selective  coronary angiography via Judkins technique, pre and post intracoronary  nitroglycerin administration, LV angiogram in RAO and LAO projection,  left subclavian subselective LIMA injection, abdominal aortic angiogram  with midstream PA projection, bilateral iliac angiography with the PA  projection, bilateral lower extremity runoff using DSA at step table  imaging, right CFA 6-French StarClose closure device successfully  deployed.   PROCEDURE:  The patient was brought to the second floor CP lab in a  postabsorptive state after 5 mg Valium p.o. premedication.  Baseline  creatinine was 1.25, BUN 19.  She was hydrated preoperatively.  UTI on  outpatient laboratory was noted with asymptomatic pyuria, and the  patient had a course of Cipro.  She was also noted to have significant  anemia with an H&H of 9.8/29.2 and normal platelet count.  Workup for  this is in progress.  She has a negative Hemoccult stool the a.m. of the  procedure, and there is no history of GI bleeding, although she said she  has had a chronic history of anemia.   Informed consent was obtained to proceed with combined coronary and  peripheral angiography.   INDICATIONS:  [name] is a 77 year old African American single woman  with no children who is a nonsmoker.  She does have a history of  hypertension, diabetes, hyperlipidemia, peripheral neuropathy,  peripheral arterial disease, and exertional chest discomfort.  Her  Persantine Myoview was felt to be high risk with anterior, anterolateral  ischemia, and EF dropped to 37%.  EF on 2-D echo was 53%, at rest August  of 2007 with no significant valvular disease.  She has a history of  bilateral claudication and peripheral neuropathy and ABIs of 10 in the  past, had been compatible with of posterior tibial occlusion  bilaterally.   Carotid Dopplers last year showed minor right carotid disease.  She has  been treated for hyperlipidemia, systemic hypertension, and has a normal  baseline EKG.  Her angina is typical substernal chest pain with exertion  associated with shortness of breath.  The patient lives alone but does  have a close friend near by.  She had been on Motrin as an outpatient  for arthritis.  Of note is the fact that  outpatient preoperative chest  x-ray showed a vague density on the lateral projection of the left lung  and some prominent hilar bilaterally without definite adenopathy.  It  was felt that repeat chest x-ray and possible CT should be followed up,  and this is in progress.   The patient was brought to the second floor PV lab in the postabsorptive  state after 5 mg Valium p.o.  She was hydrated preoperatively.  She was  given 2 mg  of Versed for sedation in the laboratory.  The right groin  was prepped and draped in the usual manner.  1% Xylocaine was used for  local anesthesia.  The RCFA was entered with single anterior puncture  using 18 thin-wall needle and a 6-French short sidearm sheath were  inserted without difficulty.  Diagnostic coronary angiography was done  with 6-French 4-cm taper preformed Cordis coronary and pigtail  catheters.  Intracoronary nitroglycerin was given through the left  coronary artery, 200 mcg, with repeat injections obtained.  LV angiogram  was done in the RAO and LAO projection.  25 mL and 14 mL per second, 20  mL 12 mL, respectively.  Pullback pressure in the CA was performed and  showed no gradient across the aortic valve.  Subselective LIMA and left  subclavian injection  was done with the right coronary catheter.  Abdominal aortic angiogram was then done in the midstream PA projection,  DSA at 25 mL, 20 mL per second.  A second injection was done above the  level of the iliac bifurcation.  Bilateral lower extremity runoff was  then done with DSA imaging at 70 mL, 7 mL per second, with step table  imaging down to the foot bilaterally.  Right iliac angiogram was done by  hand injection through the side-arm sheath when the catheter was  removed.  This demonstrated good puncture into the RCFA.  StarClose  nitinol clip closure was performed on RCFA successfully in the  laboratory.  The patient was transferred to the holding area for  postoperative care in stable condition.  She tolerated the procedure  well.  Glucophage was held coming into the hospital, and her Motrin is  being held.  Cozaar was held on the day of the procedure as well.   PRESSURES:  LV:  165/6; LVEDP 18 mmHg.   CA:  165/80 mmHg.   There was no gradient across the aortic valve on catheter pullback.   The LIMA is a large vessel, widely patent, and there was no subclavian  stenosis with good antegrade vertebral flow, although it is tortuous  proximally.   LV angiogram in the RAO projection showed mild hypokinesis of the  anteroapical and inferoapical areas that was fairly extensive, but wall  motion was preserved.  In the LAO projection, there was hypokinesis of  the antero and posteroapical segment.  There was no significant mitral  regurgitation.  EF was greater than 50%.  There was angiographic LVH  present.   The fluoroscopy demonstrated 2-3+ extensive eggshell calcification  throughout the proximal third of the LAD and 2+ calcification of the  proximal right coronary artery.  There was mild mitral annular  calcification.   The main left coronary was normal.   The left anterior descending had 40% narrowing very proximally in the  area of calcification with 70-80% narrowing at the  level of the first  diagonal branch involving its origin.  This was fairly focal with  decreased dye density.  There was a second lesion in the mid-LAD at the  area of small second diagonal branch that had 85% stenosis and another  area of 85-90% stenosis just beyond this at the junction of the distal  third of the LAD.  The LAD then coursed to the apex of the heart and  bifurcated.   The first diagonal was long, relatively thin but moderate size, with a  90% stenosis arising after the first septal perforator branch.  The  second diagonal was a  small vessel from the mid-LAD and had a 95%  proximal stenosis.   The circumflex artery gave off a large bifurcating OM1 that had a 95%  proximal stenosis and an 85% proximal/midstenosis before bifurcation.  The circumflex was then 100% occluded after the left atrial branch with  no significant antegrade flow.   The right coronary artery demonstrated 95% proximal stenosis beyond the  proximal bend and a tandem 75% stenosis.  This occurred beyond the large  acute marginal RV branch that supplied grade 3 collaterals to  trifurcating mid and distal circumflex.  The remainder of the right  coronary had irregularities with high-grade stenosis.  The PDA was  intact.  The bifurcating PLA was intact.   From the standpoint of her coronary disease, she has significant  calcific three-vessel disease with wall motion abnormalities but  preserved motion.  She appears to be a candidate for multivessel CABG in  this setting.   PERIPHERAL ANGIOGRAPHY:  Abdominal angiogram reveals patent celiac and  SMA axis.  The renal arteries are single and normal bilaterally.  The  infrarenal  abdominal aorta is his widely patent and smooth with no  aneurysm or stenosis.  The IMA was intact.   The common, external, and hypogastric arteries were widely patent,  smooth, and normal.  The SFA profunda junctions bilaterally were normal.   The right SFA had 50% narrowing in  the proximal third.  There was 40%  narrowing of the left SFA with irregularity but no significant stenosis  in a large vessel.  The popliteals were intact bilaterally.   The right posterior tibial was totally occluded.  The right peroneal was  intact with irregularities throughout.  The RAT had tandem 50% proximal,  90% mid, and segmental 95% mid to distal stenosis.   The left popliteal was intact.  The left posterior tibial was totally  occluded.  The LAD had 90, 95, 95 and 90% tandem stenoses from the  proximal third down to the distal third of the vessel.  The peroneal had  95% stenosis.  There was flow predominately through the LAT and peroneal  to the feet bilaterally with bilateral posterior tibial occlusion.   From the standpoint of her peripheral vascular disease, she has  extensive tibial disease.  There is no critical limb ischemia.  I would  recommend medical therapy including antiplatelet therapy and possible  Pletal therapy for this.  We recommend continuation of statins.  If she  does have multivessel CABG, she would be a candidate to add Plavix to  her regimen.  At present, we will continue aspirin.   We will obtain CVTS consultation for purpose of consideration for  multivessel CABG in this setting.  She will need x-ray of her chest to  rule out any hilar or left-sided masses.  She also needs further  evaluation of her significant anemia.  As mentioned, she has been on  nonsteroidal Motrin which we will hold, and laboratories are in  progress.  She may need associated GI and possible hematology  consultation.   CATHETERIZATION DIAGNOSES:  1. Exertional angina, typical.  2. Severe calcific three-vessel coronary artery disease, as outlined      above.  3. Segmental wall motion abnormalities, well-preserved ejection      fraction.  4. Peripheral arterial disease, high-grade bilateral tibial disease      with bilateral posterior tibial occlusion and high grade  tandem      bilateral anterior tibial stenoses.  5. Hyperlipidemia.  6.  Systemic hypertension.  7. Adult-onset diabetes mellitus.  8. Peripheral neuropathy.  9. Anemia, evaluation in progress.  10.Possible left lung lesion.      Richard A. Rollene Fare, M.D.  Electronically Signed     RAW/MEDQ  D:  01/21/2007  T:  01/22/2007  Job:  [id number]   cc:   Leslye Peer, MD  Tesfaye D. Legrand Rams, MD

## 2010-08-15 ENCOUNTER — Encounter (HOSPITAL_COMMUNITY): Payer: Medicare Other | Attending: Oncology

## 2010-08-15 ENCOUNTER — Encounter (HOSPITAL_COMMUNITY): Payer: Medicare Other

## 2010-08-15 ENCOUNTER — Other Ambulatory Visit (HOSPITAL_COMMUNITY): Payer: Self-pay | Admitting: Oncology

## 2010-08-15 DIAGNOSIS — E7800 Pure hypercholesterolemia, unspecified: Secondary | ICD-10-CM | POA: Insufficient documentation

## 2010-08-15 DIAGNOSIS — E119 Type 2 diabetes mellitus without complications: Secondary | ICD-10-CM | POA: Insufficient documentation

## 2010-08-15 DIAGNOSIS — Z79899 Other long term (current) drug therapy: Secondary | ICD-10-CM | POA: Insufficient documentation

## 2010-08-15 DIAGNOSIS — D638 Anemia in other chronic diseases classified elsewhere: Secondary | ICD-10-CM

## 2010-08-15 DIAGNOSIS — I10 Essential (primary) hypertension: Secondary | ICD-10-CM | POA: Insufficient documentation

## 2010-08-15 LAB — CBC
Platelets: 193 10*3/uL (ref 150–400)
RBC: 3.96 MIL/uL (ref 3.87–5.11)
RDW: 15.2 % (ref 11.5–15.5)
WBC: 6.5 10*3/uL (ref 4.0–10.5)

## 2010-09-12 ENCOUNTER — Encounter (HOSPITAL_COMMUNITY): Payer: Medicare Other

## 2010-09-12 ENCOUNTER — Encounter (HOSPITAL_COMMUNITY): Payer: Medicare Other | Attending: Oncology

## 2010-09-12 ENCOUNTER — Other Ambulatory Visit (HOSPITAL_COMMUNITY): Payer: Self-pay | Admitting: Oncology

## 2010-09-12 DIAGNOSIS — D638 Anemia in other chronic diseases classified elsewhere: Secondary | ICD-10-CM | POA: Insufficient documentation

## 2010-09-12 DIAGNOSIS — E119 Type 2 diabetes mellitus without complications: Secondary | ICD-10-CM | POA: Insufficient documentation

## 2010-09-12 DIAGNOSIS — I10 Essential (primary) hypertension: Secondary | ICD-10-CM | POA: Insufficient documentation

## 2010-09-12 DIAGNOSIS — Z79899 Other long term (current) drug therapy: Secondary | ICD-10-CM | POA: Insufficient documentation

## 2010-09-12 DIAGNOSIS — E7800 Pure hypercholesterolemia, unspecified: Secondary | ICD-10-CM | POA: Insufficient documentation

## 2010-09-12 LAB — CBC
HCT: 30 % — ABNORMAL LOW (ref 36.0–46.0)
Hemoglobin: 9.9 g/dL — ABNORMAL LOW (ref 12.0–15.0)
MCHC: 33 g/dL (ref 30.0–36.0)
WBC: 4.9 10*3/uL (ref 4.0–10.5)

## 2010-10-05 ENCOUNTER — Encounter (HOSPITAL_COMMUNITY): Payer: Self-pay | Admitting: Oncology

## 2010-10-05 ENCOUNTER — Other Ambulatory Visit (HOSPITAL_COMMUNITY): Payer: Self-pay | Admitting: Oncology

## 2010-10-05 DIAGNOSIS — D631 Anemia in chronic kidney disease: Secondary | ICD-10-CM

## 2010-10-05 HISTORY — DX: Anemia in chronic kidney disease: D63.1

## 2010-10-10 ENCOUNTER — Encounter (HOSPITAL_COMMUNITY): Payer: Medicare Other | Attending: Oncology

## 2010-10-10 DIAGNOSIS — D638 Anemia in other chronic diseases classified elsewhere: Secondary | ICD-10-CM

## 2010-10-10 DIAGNOSIS — N189 Chronic kidney disease, unspecified: Secondary | ICD-10-CM

## 2010-10-10 LAB — CBC
HCT: 30.8 % — ABNORMAL LOW (ref 36.0–46.0)
Hemoglobin: 10.3 g/dL — ABNORMAL LOW (ref 12.0–15.0)
MCH: 29.2 pg (ref 26.0–34.0)
MCV: 87.3 fL (ref 78.0–100.0)
RBC: 3.53 MIL/uL — ABNORMAL LOW (ref 3.87–5.11)
WBC: 5.4 10*3/uL (ref 4.0–10.5)

## 2010-10-10 MED ORDER — EPOETIN ALFA 20000 UNIT/ML IJ SOLN
INTRAMUSCULAR | Status: AC
  Filled 2010-10-10: qty 1

## 2010-10-10 MED ORDER — EPOETIN ALFA 40000 UNIT/ML IJ SOLN
INTRAMUSCULAR | Status: AC
  Filled 2010-10-10: qty 1

## 2010-10-10 MED ORDER — EPOETIN ALFA 10000 UNIT/ML IJ SOLN
60000.0000 [IU] | Freq: Once | INTRAMUSCULAR | Status: AC
  Administered 2010-10-10: 60000 [IU] via SUBCUTANEOUS
  Filled 2010-10-10: qty 6

## 2010-10-10 NOTE — Progress Notes (Signed)
[name] presented for labwork. Labs per MD order drawn via Peripheral Line 23 gauge needle inserted in rt ac   Procedure without incident Patient tolerated procedure well.

## 2010-10-10 NOTE — Progress Notes (Signed)
[name] presents today for injection per MD orders. Procrit 60000 units administered SQ in left Abdomen. Administration without incident. Patient tolerated well.

## 2010-10-10 NOTE — Progress Notes (Signed)
Addended by: Kurtis Bushman A on: 10/10/2010 12:32 PM   Modules accepted: Orders

## 2010-10-31 ENCOUNTER — Ambulatory Visit (HOSPITAL_COMMUNITY): Payer: Medicare Other

## 2010-11-04 ENCOUNTER — Encounter (HOSPITAL_COMMUNITY): Payer: Medicare Other | Attending: Oncology

## 2010-11-04 DIAGNOSIS — D638 Anemia in other chronic diseases classified elsewhere: Secondary | ICD-10-CM | POA: Insufficient documentation

## 2010-11-04 LAB — CBC
HCT: 31.8 % — ABNORMAL LOW (ref 36.0–46.0)
Hemoglobin: 10.3 g/dL — ABNORMAL LOW (ref 12.0–15.0)
MCHC: 32.4 g/dL (ref 30.0–36.0)
MCV: 87.4 fL (ref 78.0–100.0)
RDW: 13.5 % (ref 11.5–15.5)

## 2010-11-04 MED ORDER — EPOETIN ALFA 10000 UNIT/ML IJ SOLN
60000.0000 [IU] | Freq: Once | INTRAMUSCULAR | Status: DC
  Filled 2010-11-04: qty 6

## 2010-11-04 MED ORDER — EPOETIN ALFA 40000 UNIT/ML IJ SOLN
INTRAMUSCULAR | Status: AC
  Administered 2010-11-04: 40000 [IU]
  Filled 2010-11-04: qty 1

## 2010-11-04 MED ORDER — EPOETIN ALFA 20000 UNIT/ML IJ SOLN
INTRAMUSCULAR | Status: AC
  Administered 2010-11-04: 20000 [IU]
  Filled 2010-11-04: qty 1

## 2010-11-04 NOTE — Progress Notes (Signed)
[name] presents today for injection per MD orders. Procrit 60,000 units administered SQ in right Abdomen. Administration without incident. Patient tolerated well.

## 2010-11-04 NOTE — Progress Notes (Signed)
Scanned patient put it didn't work.

## 2010-11-11 ENCOUNTER — Encounter (HOSPITAL_COMMUNITY): Payer: Medicare Other | Admitting: Oncology

## 2010-11-11 ENCOUNTER — Ambulatory Visit (HOSPITAL_COMMUNITY): Payer: Medicare Other

## 2010-11-25 ENCOUNTER — Ambulatory Visit (HOSPITAL_COMMUNITY): Payer: Medicare Other

## 2010-12-10 ENCOUNTER — Encounter (HOSPITAL_COMMUNITY): Payer: Self-pay | Admitting: Oncology

## 2010-12-10 ENCOUNTER — Encounter (HOSPITAL_COMMUNITY): Payer: Medicare Other | Attending: Oncology | Admitting: Oncology

## 2010-12-10 DIAGNOSIS — C9200 Acute myeloblastic leukemia, not having achieved remission: Secondary | ICD-10-CM

## 2010-12-10 DIAGNOSIS — D638 Anemia in other chronic diseases classified elsewhere: Secondary | ICD-10-CM | POA: Insufficient documentation

## 2010-12-10 LAB — CBC
Hemoglobin: 10.4 g/dL — ABNORMAL LOW (ref 12.0–15.0)
RBC: 3.63 MIL/uL — ABNORMAL LOW (ref 3.87–5.11)
WBC: 7.5 10*3/uL (ref 4.0–10.5)

## 2010-12-10 MED ORDER — EPOETIN ALFA 10000 UNIT/ML IJ SOLN: 60000.0000 [IU] | Freq: Once | INTRAMUSCULAR | Status: DC

## 2010-12-10 MED ORDER — EPOETIN ALFA 40000 UNIT/ML IJ SOLN
INTRAMUSCULAR | Status: AC
  Administered 2010-12-10: 40000 [IU] via SUBCUTANEOUS
  Filled 2010-12-10: qty 1

## 2010-12-10 MED ORDER — EPOETIN ALFA 20000 UNIT/ML IJ SOLN
INTRAMUSCULAR | Status: AC
  Administered 2010-12-10: 20000 [IU] via SUBCUTANEOUS
  Filled 2010-12-10: qty 1

## 2010-12-10 NOTE — Progress Notes (Signed)
[name] presents today for injection per MD orders. Procrit 60,000 units  administered SQ in right and left upper abdomen in divided dosages. . Administration without incident. Patient tolerated well.

## 2010-12-10 NOTE — Progress Notes (Signed)
[name], [place] [address]  1. Anemia of chronic disease  CBC, CBC, epoetin alfa (EPOGEN,PROCRIT) injection 60,000 Units    CURRENT THERAPY:Procrit injection 60,000 Units every 21 days  INTERVAL HISTORY: [name] 77 y.o. female returns for  regular  visit for followup of anemia of chronic disease.  The patient denies any complaints today.  She reports that she recently went on vacation to [place], [place].  The patient explains that her appetite is strong.  She consumes at least three meals per day.  She admits that she has lost some weight be she associates that weight loss with water weight.  She admits to some blood on the toilet paper after a bowel movement occasionally.  She denies any blood in stool, black tarry stool, hemoptysis, cough, fevers, chills, night sweats, decrease in appetite, or urinary complaints.  She denies any abdominal pain or discomfort.  The patient explains that she is able to perform any task she wishes.  Her last mammogram was a Bi-rads 1.  She reports that she will have one performed in the near future again for screening purposes.  The patient notes at the end of our visit that she occasionally get dizzy and lightheaded during positional changes.  I spent time with the patient going over orthostatic hypotension, pathophysiology and etiology.  She understands that she must be cautious when changing positions.    I also spent time with the patient going over hemoglobin and its role in the body.    Past Medical History  Diagnosis Date  . Anemia of chronic disease 10/05/2010  . Diabetes mellitus   . Hypertension   . High cholesterol   . Neuropathy     has CHRONIC KIDNEY DISEASE UNSPECIFIED and Anemia of chronic disease on her problem list.      has no known allergies.  [name] had no medications administered during this visit.  Past Surgical History  Procedure Date  . Heart bypass surgery   . Left eye  surgery     Denies any headaches, dizziness, double vision, fevers, chills, night sweats, nausea, vomiting, diarrhea, constipation, chest pain, heart palpitations, shortness of breath, blood in stool, black tarry stool, urinary pain, urinary burning, urinary frequency, hematuria.   PHYSICAL EXAMINATION  ECOG PERFORMANCE STATUS: 1 - Symptomatic but completely ambulatory  Filed Vitals:   12/10/10 1020  BP: 159/66  Pulse: 85  Temp: 98.5 F (36.9 C)    GENERAL:alert, no distress, well nourished, well developed, comfortable, cooperative and smiling SKIN: skin color, texture, turgor are normal HEAD: Normocephalic EYES: normal EARS: External ears normal OROPHARYNX:mucous membranes are moist  NECK: trachea midline LYMPH:  no hepatosplenomegaly BREAST:not examined LUNGS: clear to auscultation and percussion HEART: regular rate & rhythm, no gallops, S1 normal and S2 normal, with a [id number] systolic ejection murmur heard best at the right and left sternal border. ABDOMEN:abdomen soft, non-tender, normal bowel sounds, no masses or organomegaly and no hepatosplenomegaly BACK: No CVA tenderness EXTREMITIES:less then 2 second capillary refill, no joint deformities, effusion, or inflammation, no skin discoloration, no clubbing, no cyanosis, positive findings:  edema 2+ pitting edema of LE B/L  NEURO: alert & oriented x 3 with fluent speech, no focal motor/sensory deficits, gait normal  LABORATORY DATA: CBC    Component Value Date/Time   WBC 5.6 11/04/2010 1155   RBC 3.64* 11/04/2010 1155   HGB 10.3* 11/04/2010 1155   HCT 31.8* 11/04/2010 1155   PLT 225 11/04/2010 1155  MCV 87.4 11/04/2010 1155   MCH 28.3 11/04/2010 1155   MCHC 32.4 11/04/2010 1155   RDW 13.5 11/04/2010 1155    ASSESSMENT:  1. Anemia of chronic disease, receiving Procrit 60,000 U SQ every 21 days 2. LE edema B/L 3. Orthostatic hypotension 4. Systolic ejection murmur, followed by cardiology   PLAN:  1. I personally reviewed and  went over laboratory results with the patient. 2. Patient education regarding the role of Hgb in the body. 3. Patient education regarding orthostatic hypotension 4. Lab work every 21 days with Procrit injections: CBC 5. Procrit 60,000 U SQ today and every 21 days 6. Return in 6 months for follow-up. 7. Mammogram in the near future. 8. Patient education regarding LE edema.  She knows to keep her feet elevated when she is resting.   All questions were answered. The patient knows to call the clinic with any problems, questions or concerns. We can certainly see the patient much sooner if necessary.  More than 50% of the time spent with the patient was utilized for counseling.  [name]

## 2010-12-10 NOTE — Patient Instructions (Signed)
[facility]  Discharge Instructions  RECOMMENDATIONS MADE BY THE CONSULTANT AND ANY TEST RESULTS WILL BE SENT TO YOUR REFERRING DOCTOR.   EXAM FINDINGS BY MD TODAY AND SIGNS AND SYMPTOMS TO REPORT TO CLINIC OR PRIMARY MD: WILL CONTINUE INJECTIONS LABS AND INJECTIONS EVERY 21 DAYS   MEDICATIONS PRESCRIBED: NONE   INSTRUCTIONS GIVEN AND DISCUSSED: Other : REPORT SHORTNESS OF BREATH , LOWER EXTREMITY SWELLING, ETC.   SPECIAL INSTRUCTIONS/FOLLOW-UP: Return to Clinic on [facility] and Other:  RETURN TO SEE PA IN 6 MONTHS   I acknowledge that I have been informed and understand all the instructions given to me and received a copy. I do not have any more questions at this time, but understand that I may call the Specialty Clinic at [facility] at [phone] during business hours should I have any further questions or need assistance in obtaining follow-up care.    __________________________________________  _____________  __________ Signature of Patient or Authorized Representative            Date                   Time    __________________________________________ Nurse's Signature

## 2010-12-30 ENCOUNTER — Encounter (HOSPITAL_COMMUNITY): Payer: Medicare Other | Attending: Oncology

## 2010-12-30 DIAGNOSIS — N189 Chronic kidney disease, unspecified: Secondary | ICD-10-CM | POA: Insufficient documentation

## 2010-12-30 DIAGNOSIS — D638 Anemia in other chronic diseases classified elsewhere: Secondary | ICD-10-CM

## 2010-12-30 DIAGNOSIS — D631 Anemia in chronic kidney disease: Secondary | ICD-10-CM | POA: Insufficient documentation

## 2010-12-30 LAB — CBC
HCT: 31.2 % — ABNORMAL LOW (ref 36.0–46.0)
Hemoglobin: 10.2 g/dL — ABNORMAL LOW (ref 12.0–15.0)
MCV: 87.4 fL (ref 78.0–100.0)
RBC: 3.57 MIL/uL — ABNORMAL LOW (ref 3.87–5.11)
WBC: 5 10*3/uL (ref 4.0–10.5)

## 2010-12-30 MED ORDER — EPOETIN ALFA 10000 UNIT/ML IJ SOLN: 60000.0000 [IU] | Freq: Once | INTRAMUSCULAR | Status: DC

## 2010-12-30 MED ORDER — EPOETIN ALFA 40000 UNIT/ML IJ SOLN
INTRAMUSCULAR | Status: AC
  Administered 2010-12-30: 60000 [IU] via SUBCUTANEOUS
  Filled 2010-12-30: qty 1

## 2010-12-30 MED ORDER — EPOETIN ALFA 20000 UNIT/ML IJ SOLN
INTRAMUSCULAR | Status: AC
  Filled 2010-12-30: qty 1

## 2010-12-30 NOTE — Progress Notes (Signed)
Vss.   Specimen obtained from right ac for cbc. Tolerated well.  Procrit 40,000 units given subcutaneously to lower right abd.

## 2011-01-08 LAB — POCT I-STAT 3, ART BLOOD GAS (G3+)
Acid-Base Excess: 2
Bicarbonate: 25.4 — ABNORMAL HIGH
Bicarbonate: 26.1 — ABNORMAL HIGH
O2 Saturation: 98
O2 Saturation: 98
Operator id: 3291
Patient temperature: 37.2
TCO2: 21
TCO2: 26
TCO2: 27
pCO2 arterial: 34.7 — ABNORMAL LOW
pCO2 arterial: 41
pH, Arterial: 7.412 — ABNORMAL HIGH
pH, Arterial: 7.507 — ABNORMAL HIGH
pO2, Arterial: 104 — ABNORMAL HIGH
pO2, Arterial: 258 — ABNORMAL HIGH
pO2, Arterial: 311 — ABNORMAL HIGH

## 2011-01-08 LAB — POCT I-STAT 4, (NA,K, GLUC, HGB,HCT)
Glucose, Bld: 142 — ABNORMAL HIGH
Glucose, Bld: 214 — ABNORMAL HIGH
HCT: 23 — ABNORMAL LOW
HCT: 25 — ABNORMAL LOW
Hemoglobin: 11.2 — ABNORMAL LOW
Hemoglobin: 6.8 — CL
Hemoglobin: 7.8 — CL
Hemoglobin: 8.5 — ABNORMAL LOW
Operator id: 128731
Operator id: 3291
Operator id: 3291
Operator id: 3291
Operator id: 3291
Potassium: 4.3
Sodium: 139
Sodium: 139
Sodium: 141

## 2011-01-08 LAB — BASIC METABOLIC PANEL
BUN: 13
BUN: 16
BUN: 18
BUN: 19
BUN: 23
BUN: 24 — ABNORMAL HIGH
CO2: 23
CO2: 26
CO2: 28
CO2: 29
Calcium: 7.1 — ABNORMAL LOW
Calcium: 8 — ABNORMAL LOW
Calcium: 8.1 — ABNORMAL LOW
Calcium: 8.2 — ABNORMAL LOW
Calcium: 8.3 — ABNORMAL LOW
Calcium: 8.7
Chloride: 104
Chloride: 109
Chloride: 109
Creatinine, Ser: 1.18
Creatinine, Ser: 1.35 — ABNORMAL HIGH
Creatinine, Ser: 1.38 — ABNORMAL HIGH
GFR calc Af Amer: 45 — ABNORMAL LOW
GFR calc Af Amer: 47 — ABNORMAL LOW
GFR calc Af Amer: 54 — ABNORMAL LOW
GFR calc non Af Amer: 37 — ABNORMAL LOW
GFR calc non Af Amer: 38 — ABNORMAL LOW
GFR calc non Af Amer: 39 — ABNORMAL LOW
GFR calc non Af Amer: 40 — ABNORMAL LOW
GFR calc non Af Amer: 45 — ABNORMAL LOW
GFR calc non Af Amer: 49 — ABNORMAL LOW
Glucose, Bld: 111 — ABNORMAL HIGH
Glucose, Bld: 141 — ABNORMAL HIGH
Glucose, Bld: 157 — ABNORMAL HIGH
Glucose, Bld: 170 — ABNORMAL HIGH
Glucose, Bld: 40 — ABNORMAL LOW
Glucose, Bld: 67 — ABNORMAL LOW
Potassium: 3.2 — ABNORMAL LOW
Potassium: 3.4 — ABNORMAL LOW
Potassium: 3.6
Potassium: 3.9
Potassium: 4.2
Potassium: 4.2
Sodium: 137
Sodium: 139
Sodium: 140
Sodium: 140

## 2011-01-08 LAB — CBC
HCT: 29 — ABNORMAL LOW
HCT: 30.8 — ABNORMAL LOW
HCT: 31.9 — ABNORMAL LOW
Hemoglobin: 10.3 — ABNORMAL LOW
Hemoglobin: 10.7 — ABNORMAL LOW
Hemoglobin: 11.1 — ABNORMAL LOW
Hemoglobin: 11.3 — ABNORMAL LOW
Hemoglobin: 9.4 — ABNORMAL LOW
Hemoglobin: 9.6 — ABNORMAL LOW
MCHC: 32.8
MCHC: 32.8
MCHC: 32.9
MCHC: 32.9
MCHC: 33
MCHC: 33.5
MCHC: 33.5
MCHC: 33.6
MCV: 85
MCV: 87.1
MCV: 88.7
Platelets: 101 — ABNORMAL LOW
Platelets: 106 — ABNORMAL LOW
Platelets: 83 — ABNORMAL LOW
Platelets: 96 — ABNORMAL LOW
RBC: 3.21 — ABNORMAL LOW
RBC: 3.33 — ABNORMAL LOW
RBC: 3.54 — ABNORMAL LOW
RBC: 3.7 — ABNORMAL LOW
RBC: 3.75 — ABNORMAL LOW
RBC: 3.9
RBC: 3.97
RDW: 15.8 — ABNORMAL HIGH
RDW: 15.9 — ABNORMAL HIGH
RDW: 16.2 — ABNORMAL HIGH
WBC: 11.6 — ABNORMAL HIGH
WBC: 14.8 — ABNORMAL HIGH
WBC: 6.5

## 2011-01-08 LAB — I-STAT EC8
Bicarbonate: 20.4
HCT: 33 — ABNORMAL LOW
Hemoglobin: 11.2 — ABNORMAL LOW
Operator id: 298181
Sodium: 141
TCO2: 21

## 2011-01-08 LAB — BLOOD GAS, ARTERIAL
Acid-Base Excess: 1.8
Patient temperature: 98.6
TCO2: 27.1
pCO2 arterial: 40.6

## 2011-01-08 LAB — IRON AND TIBC
Iron: 61
UIBC: 241

## 2011-01-08 LAB — TYPE AND SCREEN
ABO/RH(D): B POS
Antibody Screen: NEGATIVE

## 2011-01-08 LAB — URINALYSIS, ROUTINE W REFLEX MICROSCOPIC
Bilirubin Urine: NEGATIVE
Nitrite: NEGATIVE
Specific Gravity, Urine: 1.013
pH: 6

## 2011-01-08 LAB — ABO/RH: ABO/RH(D): B POS

## 2011-01-08 LAB — I-STAT 8, (EC8 V) (CONVERTED LAB)
BUN: 17
Bicarbonate: 21.8
Chloride: 107
Glucose, Bld: 147 — ABNORMAL HIGH
HCT: 33 — ABNORMAL LOW
Operator id: 148471
pCO2, Ven: 37.1 — ABNORMAL LOW
pH, Ven: 7.377 — ABNORMAL HIGH

## 2011-01-08 LAB — HEMOGLOBIN A1C
Hgb A1c MFr Bld: 6.7 — ABNORMAL HIGH
Mean Plasma Glucose: 161

## 2011-01-08 LAB — CREATININE, SERUM
Creatinine, Ser: 0.98
Creatinine, Ser: 1.43 — ABNORMAL HIGH
GFR calc Af Amer: 44 — ABNORMAL LOW
GFR calc non Af Amer: 56 — ABNORMAL LOW

## 2011-01-08 LAB — POCT I-STAT 3, VENOUS BLOOD GAS (G3P V)
O2 Saturation: 82
TCO2: 26
pCO2, Ven: 38.7 — ABNORMAL LOW
pO2, Ven: 46 — ABNORMAL HIGH

## 2011-01-08 LAB — APTT
aPTT: 30
aPTT: 39 — ABNORMAL HIGH

## 2011-01-08 LAB — OCCULT BLOOD X 1 CARD TO LAB, STOOL: Fecal Occult Bld: NEGATIVE

## 2011-01-08 LAB — MAGNESIUM
Magnesium: 1.7
Magnesium: 2.2

## 2011-01-08 LAB — DIFFERENTIAL
Basophils Relative: 1
Monocytes Absolute: 0.5
Monocytes Relative: 9
Neutro Abs: 3.4

## 2011-01-08 LAB — COMPREHENSIVE METABOLIC PANEL
BUN: 20
CO2: 28
Calcium: 8.8
Chloride: 104
Creatinine, Ser: 1.35 — ABNORMAL HIGH
GFR calc Af Amer: 47 — ABNORMAL LOW
GFR calc non Af Amer: 38 — ABNORMAL LOW
Glucose, Bld: 191 — ABNORMAL HIGH
Total Bilirubin: 0.4

## 2011-01-08 LAB — VITAMIN B12: Vitamin B-12: 324 (ref 211–911)

## 2011-01-08 LAB — HEMOGLOBIN AND HEMATOCRIT, BLOOD
HCT: 24.5 — ABNORMAL LOW
Hemoglobin: 8.1 — ABNORMAL LOW

## 2011-01-08 LAB — PROTIME-INR: INR: 1.7 — ABNORMAL HIGH

## 2011-01-08 LAB — CEA: CEA: 2.2

## 2011-01-08 LAB — RETICULOCYTES
RBC.: 3.84 — ABNORMAL LOW
Retic Count, Absolute: 30.7

## 2011-01-08 LAB — PLATELET COUNT: Platelets: 121 — ABNORMAL LOW

## 2011-01-20 ENCOUNTER — Encounter (HOSPITAL_BASED_OUTPATIENT_CLINIC_OR_DEPARTMENT_OTHER): Payer: Medicare Other

## 2011-01-20 DIAGNOSIS — D638 Anemia in other chronic diseases classified elsewhere: Secondary | ICD-10-CM

## 2011-01-20 LAB — CBC
MCHC: 32.7 g/dL (ref 30.0–36.0)
Platelets: 206 10*3/uL (ref 150–400)
RDW: 14 % (ref 11.5–15.5)
WBC: 5.1 10*3/uL (ref 4.0–10.5)

## 2011-01-20 MED ORDER — EPOETIN ALFA 40000 UNIT/ML IJ SOLN
INTRAMUSCULAR | Status: AC
  Administered 2011-01-20: 40000 [IU] via SUBCUTANEOUS
  Filled 2011-01-20: qty 1

## 2011-01-20 MED ORDER — EPOETIN ALFA 20000 UNIT/ML IJ SOLN
INTRAMUSCULAR | Status: AC
  Administered 2011-01-20: 20000 [IU] via SUBCUTANEOUS
  Filled 2011-01-20: qty 1

## 2011-01-20 NOTE — Progress Notes (Signed)
[name] presented for labwork. Labs per MD order drawn via Peripheral Line 25 gauge needle inserted in  Right arm Good blood return present. Procedure without incident.  Needle removed intact. Patient tolerated procedure well.

## 2011-01-20 NOTE — Progress Notes (Signed)
[name] presents today for injection per MD orders. Procrit 60000 units administered SQ in right Abdomen. Administration without incident. Patient tolerated well.

## 2011-02-10 ENCOUNTER — Encounter (HOSPITAL_COMMUNITY): Payer: Medicare Other | Attending: Oncology

## 2011-02-10 DIAGNOSIS — D638 Anemia in other chronic diseases classified elsewhere: Secondary | ICD-10-CM

## 2011-02-10 LAB — CBC
Hemoglobin: 9.8 g/dL — ABNORMAL LOW (ref 12.0–15.0)
Platelets: 201 10*3/uL (ref 150–400)
RBC: 3.41 MIL/uL — ABNORMAL LOW (ref 3.87–5.11)
WBC: 4.8 10*3/uL (ref 4.0–10.5)

## 2011-02-10 MED ORDER — EPOETIN ALFA 10000 UNIT/ML IJ SOLN
60000.0000 [IU] | Freq: Once | INTRAMUSCULAR | Status: DC
  Filled 2011-02-10: qty 6

## 2011-02-10 MED ORDER — EPOETIN ALFA 20000 UNIT/ML IJ SOLN
INTRAMUSCULAR | Status: AC
  Administered 2011-02-10: 20000 [IU]
  Filled 2011-02-10: qty 1

## 2011-02-10 MED ORDER — EPOETIN ALFA 40000 UNIT/ML IJ SOLN
INTRAMUSCULAR | Status: AC
  Administered 2011-02-10: 40000 [IU]
  Filled 2011-02-10: qty 1

## 2011-02-10 NOTE — Progress Notes (Signed)
[name] presented for labwork. Labs per MD order drawn via  vp to rt ac x 1 stick  Good blood return present. Procedure without incident.  Needle removed intact. Patient tolerated procedure well.

## 2011-02-10 NOTE — Progress Notes (Signed)
[name] presents today for injection per MD orders. Procrit 60,000 administered SQ in right Abdomen. Administration without incident. Patient tolerated well.

## 2011-03-03 ENCOUNTER — Encounter (HOSPITAL_COMMUNITY): Payer: Medicare Other | Attending: Oncology

## 2011-03-03 DIAGNOSIS — N189 Chronic kidney disease, unspecified: Secondary | ICD-10-CM | POA: Insufficient documentation

## 2011-03-03 DIAGNOSIS — D638 Anemia in other chronic diseases classified elsewhere: Secondary | ICD-10-CM

## 2011-03-03 LAB — CBC
HCT: 31.7 % — ABNORMAL LOW (ref 36.0–46.0)
Hemoglobin: 10.3 g/dL — ABNORMAL LOW (ref 12.0–15.0)
MCV: 87.6 fL (ref 78.0–100.0)
RBC: 3.62 MIL/uL — ABNORMAL LOW (ref 3.87–5.11)
RDW: 14.1 % (ref 11.5–15.5)
WBC: 5.8 10*3/uL (ref 4.0–10.5)

## 2011-03-03 MED ORDER — EPOETIN ALFA 10000 UNIT/ML IJ SOLN
60000.0000 [IU] | Freq: Once | INTRAMUSCULAR | Status: DC
  Filled 2011-03-03: qty 6

## 2011-03-03 MED ORDER — EPOETIN ALFA 20000 UNIT/ML IJ SOLN
INTRAMUSCULAR | Status: AC
  Filled 2011-03-03: qty 1

## 2011-03-03 MED ORDER — EPOETIN ALFA 40000 UNIT/ML IJ SOLN
INTRAMUSCULAR | Status: AC
  Administered 2011-03-03: 60000 [IU] via SUBCUTANEOUS
  Filled 2011-03-03: qty 1

## 2011-03-03 NOTE — Progress Notes (Signed)
Addended by: Phylliss Bob on: 03/03/2011 03:51 PM   Modules accepted: Orders

## 2011-03-03 NOTE — Progress Notes (Signed)
[name] presented for labwork. Labs per MD order drawn via Peripheral Line 25 gauge needle inserted in rt arm  Good blood return present. Procedure without incident.  Needle removed intact. Patient tolerated procedure well.

## 2011-03-03 NOTE — Progress Notes (Signed)
[name] presents today for injection per MD orders. Procrit 60000 units administered SQ in left Abdomen. Administration without incident. Patient tolerated well.

## 2011-03-21 ENCOUNTER — Ambulatory Visit (HOSPITAL_COMMUNITY): Payer: Medicare Other

## 2011-04-24 ENCOUNTER — Encounter (HOSPITAL_COMMUNITY): Payer: Medicare Other | Attending: Oncology

## 2011-04-24 DIAGNOSIS — N189 Chronic kidney disease, unspecified: Secondary | ICD-10-CM

## 2011-04-24 DIAGNOSIS — D638 Anemia in other chronic diseases classified elsewhere: Secondary | ICD-10-CM | POA: Insufficient documentation

## 2011-04-24 LAB — CBC
HCT: 28.9 % — ABNORMAL LOW (ref 36.0–46.0)
Hemoglobin: 9.6 g/dL — ABNORMAL LOW (ref 12.0–15.0)
MCH: 28 pg (ref 26.0–34.0)
MCV: 84.3 fL (ref 78.0–100.0)
RBC: 3.43 MIL/uL — ABNORMAL LOW (ref 3.87–5.11)

## 2011-04-24 LAB — DIFFERENTIAL
Eosinophils Absolute: 0.1 10*3/uL (ref 0.0–0.7)
Lymphs Abs: 1.2 10*3/uL (ref 0.7–4.0)
Monocytes Absolute: 0.3 10*3/uL (ref 0.1–1.0)
Monocytes Relative: 5 % (ref 3–12)
Neutrophils Relative %: 76 % (ref 43–77)

## 2011-04-24 MED ORDER — EPOETIN ALFA 40000 UNIT/ML IJ SOLN
INTRAMUSCULAR | Status: AC
  Administered 2011-04-24: 60000 [IU]
  Filled 2011-04-24: qty 2

## 2011-04-24 MED ORDER — EPOETIN ALFA 10000 UNIT/ML IJ SOLN
60000.0000 [IU] | Freq: Once | INTRAMUSCULAR | Status: DC
  Filled 2011-04-24: qty 6

## 2011-04-24 NOTE — Progress Notes (Signed)
Addended by: Josephina Shih A on: 04/24/2011 12:02 PM   Modules accepted: Orders

## 2011-04-24 NOTE — Progress Notes (Signed)
[name] presents today for injection per MD orders. Procrit 60,000units administered SQ in right Abdomen. Administration without incident. Patient tolerated well.

## 2011-05-26 ENCOUNTER — Encounter (HOSPITAL_COMMUNITY): Payer: Medicare Other | Attending: Oncology

## 2011-05-26 DIAGNOSIS — N189 Chronic kidney disease, unspecified: Secondary | ICD-10-CM

## 2011-05-26 DIAGNOSIS — D638 Anemia in other chronic diseases classified elsewhere: Secondary | ICD-10-CM

## 2011-05-26 MED ORDER — EPOETIN ALFA 20000 UNIT/ML IJ SOLN
INTRAMUSCULAR | Status: AC
  Administered 2011-05-26: 40000 [IU] via SUBCUTANEOUS
  Filled 2011-05-26: qty 1

## 2011-05-26 MED ORDER — EPOETIN ALFA 40000 UNIT/ML IJ SOLN
INTRAMUSCULAR | Status: AC
  Filled 2011-05-26: qty 1

## 2011-05-26 MED ORDER — EPOETIN ALFA 10000 UNIT/ML IJ SOLN
60000.0000 [IU] | Freq: Once | INTRAMUSCULAR | Status: DC
  Filled 2011-05-26: qty 6

## 2011-05-26 NOTE — Progress Notes (Signed)
[name] presents today for injection per MD orders. Procrit 60000 units administered SQ in right Abdomen. Administration without incident. Patient tolerated well.

## 2011-06-13 ENCOUNTER — Encounter (HOSPITAL_COMMUNITY): Payer: Self-pay | Admitting: Oncology

## 2011-06-13 ENCOUNTER — Encounter (HOSPITAL_COMMUNITY): Payer: Medicare Other | Attending: Oncology | Admitting: Oncology

## 2011-06-13 VITALS — BP 129/55 | HR 73 | Temp 98.3°F | Ht 64.0 in | Wt 166.5 lb

## 2011-06-13 DIAGNOSIS — N189 Chronic kidney disease, unspecified: Secondary | ICD-10-CM | POA: Insufficient documentation

## 2011-06-13 DIAGNOSIS — E119 Type 2 diabetes mellitus without complications: Secondary | ICD-10-CM

## 2011-06-13 DIAGNOSIS — D638 Anemia in other chronic diseases classified elsewhere: Secondary | ICD-10-CM

## 2011-06-13 LAB — CBC
MCH: 28.1 pg (ref 26.0–34.0)
MCHC: 32.4 g/dL (ref 30.0–36.0)
MCV: 86.8 fL (ref 78.0–100.0)
Platelets: 200 10*3/uL (ref 150–400)
RBC: 3.34 MIL/uL — ABNORMAL LOW (ref 3.87–5.11)

## 2011-06-13 MED ORDER — EPOETIN ALFA 10000 UNIT/ML IJ SOLN
60000.0000 [IU] | Freq: Once | INTRAMUSCULAR | Status: AC
  Administered 2011-06-13: 60000 [IU] via SUBCUTANEOUS
  Filled 2011-06-13: qty 6

## 2011-06-13 NOTE — Progress Notes (Signed)
[name] presented for labwork. Labs per MD order drawn via Peripheral Line 23 gauge needle inserted in right antecubital.  Good blood return present. Procedure without incident.  Needle removed intact. Patient tolerated procedure well.  [name] presents today for injection per MD orders. Procrit 20,000 units plus 40,000 units administered SQ in right Abdomen. Administration without incident. Patient tolerated well.

## 2011-06-13 NOTE — Patient Instructions (Signed)
[facility]  Discharge Instructions  RECOMMENDATIONS MADE BY THE CONSULTANT AND ANY TEST RESULTS WILL BE SENT TO YOUR REFERRING DOCTOR.   Lab work today. Procrit injection 60,000 units today. We will schedule lab work and Procrit injections every 21 days. We will have you come back in 6 months to see the doctor.   I acknowledge that I have been informed and understand all the instructions given to me and received a copy. I do not have any more questions at this time, but understand that I may call the Specialty Clinic at [facility] at [phone] during business hours should I have any further questions or need assistance in obtaining follow-up care.    __________________________________________  _____________  __________ Signature of Patient or Authorized Representative            Date                   Time    __________________________________________ Nurse's Signature

## 2011-06-13 NOTE — Progress Notes (Signed)
Stacey Fire, MD, MD [address] [place] [address]  1. CHRONIC KIDNEY DISEASE UNSPECIFIED   2. Anemia of chronic disease     CURRENT THERAPY: Procrit 60,000 units every 21 days  INTERVAL HISTORY: [name] 78 y.o. female returns for  regular  visit for followup of anemia of chronic disease.  The patient reports the she is having some left knee pain.  She explains that it has been bothering her for the past three weeks.  She is taking hydrocodone for pain, but it is not very effective.  She will follow-up with her PCP regarding the knee, she may require some fluid removal from the joint.   The patient reports that she has been experiencing some easy fatigability.  Our last Hgb was from 04/24/2011 when her hgb was 9.6.  We will check it today with a CBC.  I personally reviewed and went over laboratory results with the patient.  She will get her procrit injection today and CBC.  She will then have her Procrit injection every 21 days.    ROS: No TIA's or unusual headaches, no dysphagia.  No prolonged cough. No dyspnea or chest pain on exertion.  No abdominal pain, change in bowel habits, black or bloody stools.  No urinary tract symptoms.  No abnormal vaginal bleeding, discharge or unexpected pelvic pain. No new breast lumps, breast pain or nipple discharge.     Past Medical History  Diagnosis Date  . Anemia of chronic disease 10/05/2010  . Diabetes mellitus   . Hypertension   . High cholesterol   . Neuropathy     has CHRONIC KIDNEY DISEASE UNSPECIFIED and Anemia of chronic disease on her problem list.      has no known allergies.  [name] does not currently have medications on file.  Past Surgical History  Procedure Date  . Heart bypass surgery   . Left eye surgery     Denies any headaches, dizziness, double vision, fevers, chills, night sweats, nausea, vomiting, diarrhea, constipation, chest pain, heart palpitations, shortness of breath, blood in  stool, black tarry stool, urinary pain, urinary burning, urinary frequency, hematuria.   PHYSICAL EXAMINATION  ECOG PERFORMANCE STATUS: 1 - Symptomatic but completely ambulatory  Filed Vitals:   06/13/11 1000  BP: 129/55  Pulse: 73  Temp: 98.3 F (36.8 C)    GENERAL:alert, no distress, well nourished, well developed, comfortable, cooperative and smiling SKIN: skin color, texture, turgor are normal, no rashes or significant lesions HEAD: Normocephalic, No masses, lesions, tenderness or abnormalities EYES: haziness of eyes EARS: External ears normal OROPHARYNX:mucous membranes are moist  NECK: supple, trachea midline LYMPH:  no palpable lymphadenopathy BREAST:not examined LUNGS: clear to auscultation and percussion HEART: regular rate & rhythm, no gallops, S1 normal, S2 normal and 1/6 systolic ejection murmur heard best at left sternal border.  ABDOMEN:abdomen soft, non-tender and normal bowel sounds BACK: Back symmetric, no curvature. EXTREMITIES:less then 2 second capillary refill, no joint deformities, effusion, or inflammation, no skin discoloration, no clubbing, no cyanosis, positive findings:  edema 2+ B/L LE edema L>R  NEURO: alert & oriented x 3 with fluent speech, no focal motor/sensory deficits, gait normal   LABORATORY DATA: CBC    Component Value Date/Time   WBC 6.6 04/24/2011 1202   RBC 3.43* 04/24/2011 1202   HGB 9.6* 04/24/2011 1202   HCT 28.9* 04/24/2011 1202   PLT 215 04/24/2011 1202   MCV 84.3 04/24/2011 1202   MCH 28.0 04/24/2011 1202   MCHC  33.2 04/24/2011 1202   RDW 14.7 04/24/2011 1202   LYMPHSABS 1.2 04/24/2011 1202   MONOABS 0.3 04/24/2011 1202   EOSABS 0.1 04/24/2011 1202   BASOSABS 0.0 04/24/2011 1202     ASSESSMENT:  1. Anemia of chronic disease, receiving Procrit 60,000 U SQ every 21 days  2. LE edema B/L, L>R  3. Orthostatic hypotension  4. Systolic ejection murmur, followed by cardiology   PLAN:  1. Procrit injection today.  Supportive therapy  plan built to reflect 60,000 units of Procrit every 21 days. 2. Lab work today: CBC 3. Lab work with every other procrit injection: CBC 4. Follow-up with PCP regardng left knee pain.  She may have an effusion. 5. Procrit 60,000 units every 21 days. 6. Return in 6 months for follow-up.  All questions were answered. The patient knows to call the clinic with any problems, questions or concerns. We can certainly see the patient much sooner if necessary.  The patient and plan discussed with Everardo All, MD and he is in agreement with the aforementioned.   Dylan Monforte

## 2011-06-16 ENCOUNTER — Ambulatory Visit (HOSPITAL_COMMUNITY): Payer: Medicare Other

## 2011-07-04 ENCOUNTER — Ambulatory Visit (HOSPITAL_COMMUNITY): Payer: Medicare Other

## 2011-07-18 ENCOUNTER — Encounter (HOSPITAL_COMMUNITY): Payer: Self-pay

## 2011-07-18 ENCOUNTER — Ambulatory Visit (HOSPITAL_COMMUNITY)
Admission: RE | Admit: 2011-07-18 | Discharge: 2011-07-18 | Disposition: A | Discharge status: Home / Self Care | Payer: Medicare HMO | Source: Ambulatory Visit | Attending: Cardiovascular Disease | Admitting: Cardiovascular Disease

## 2011-07-18 ENCOUNTER — Other Ambulatory Visit (HOSPITAL_COMMUNITY): Payer: Self-pay | Admitting: Cardiovascular Disease

## 2011-07-18 ENCOUNTER — Encounter (HOSPITAL_COMMUNITY)
Admission: RE | Admit: 2011-07-18 | Discharge: 2011-07-18 | Disposition: A | Discharge status: Home / Self Care | Payer: Medicare HMO | Source: Ambulatory Visit | Attending: Cardiovascular Disease | Admitting: Cardiovascular Disease

## 2011-07-18 DIAGNOSIS — R7989 Other specified abnormal findings of blood chemistry: Secondary | ICD-10-CM

## 2011-07-18 DIAGNOSIS — R0602 Shortness of breath: Secondary | ICD-10-CM

## 2011-07-18 MED ORDER — TECHNETIUM TO 99M ALBUMIN AGGREGATED
3.0000 | Freq: Once | INTRAVENOUS | Status: AC | PRN
  Administered 2011-07-18: 3.3 via INTRAVENOUS

## 2011-09-01 ENCOUNTER — Other Ambulatory Visit (HOSPITAL_COMMUNITY): Payer: Self-pay | Admitting: Oncology

## 2011-12-15 ENCOUNTER — Ambulatory Visit (HOSPITAL_COMMUNITY): Payer: Medicare Other | Admitting: Oncology

## 2011-12-15 ENCOUNTER — Encounter (HOSPITAL_COMMUNITY): Payer: Self-pay | Admitting: Oncology

## 2012-06-11 ENCOUNTER — Other Ambulatory Visit (HOSPITAL_COMMUNITY): Payer: Self-pay | Admitting: Oncology

## 2012-08-12 ENCOUNTER — Encounter (INDEPENDENT_AMBULATORY_CARE_PROVIDER_SITE_OTHER): Payer: Medicare Other

## 2012-08-12 DIAGNOSIS — N184 Chronic kidney disease, stage 4 (severe): Secondary | ICD-10-CM

## 2012-08-18 ENCOUNTER — Encounter (HOSPITAL_COMMUNITY): Payer: Medicare Other | Attending: Nephrology

## 2012-08-18 ENCOUNTER — Other Ambulatory Visit: Payer: Self-pay | Admitting: *Deleted

## 2012-08-18 ENCOUNTER — Encounter (HOSPITAL_COMMUNITY): Payer: Self-pay

## 2012-08-18 VITALS — BP 175/48 | HR 74 | Temp 98.6°F | Resp 16

## 2012-08-18 DIAGNOSIS — D509 Iron deficiency anemia, unspecified: Secondary | ICD-10-CM

## 2012-08-18 HISTORY — DX: Iron deficiency anemia, unspecified: D50.9

## 2012-08-18 MED ORDER — SODIUM CHLORIDE 0.9 % IJ SOLN
10.0000 mL | INTRAMUSCULAR | Status: DC | PRN
  Administered 2012-08-18: 10 mL via INTRAVENOUS

## 2012-08-18 MED ORDER — CILOSTAZOL 100 MG PO TABS: 100.0000 mg | ORAL_TABLET | Freq: Two times a day (BID) | ORAL | Status: DC

## 2012-08-18 MED ORDER — ROSUVASTATIN CALCIUM 5 MG PO TABS: 5.0000 mg | ORAL_TABLET | Freq: Every day | ORAL | Status: DC

## 2012-08-18 MED ORDER — FERUMOXYTOL INJECTION 510 MG/17 ML
510.0000 mg | Freq: Once | INTRAVENOUS | Status: AC
  Administered 2012-08-18: 510 mg via INTRAVENOUS
  Filled 2012-08-18: qty 17

## 2012-08-18 MED ORDER — EZETIMIBE 10 MG PO TABS: 10.0000 mg | ORAL_TABLET | Freq: Every day | ORAL | Status: DC

## 2012-08-18 NOTE — Progress Notes (Signed)
Tolerated IV fereheme 510 mg in right ac well.

## 2012-08-25 ENCOUNTER — Other Ambulatory Visit (HOSPITAL_COMMUNITY): Payer: Self-pay | Admitting: Nephrology

## 2012-08-25 DIAGNOSIS — I701 Atherosclerosis of renal artery: Secondary | ICD-10-CM

## 2012-08-27 ENCOUNTER — Other Ambulatory Visit (HOSPITAL_COMMUNITY): Payer: Medicare Other

## 2012-09-01 ENCOUNTER — Ambulatory Visit (HOSPITAL_COMMUNITY): Payer: Medicare Other

## 2012-09-02 ENCOUNTER — Ambulatory Visit (HOSPITAL_COMMUNITY)
Admission: RE | Admit: 2012-09-02 | Discharge: 2012-09-02 | Disposition: A | Discharge status: Home / Self Care | Payer: Medicare Other | Source: Ambulatory Visit | Attending: Nephrology | Admitting: Nephrology

## 2012-09-02 DIAGNOSIS — N179 Acute kidney failure, unspecified: Secondary | ICD-10-CM | POA: Insufficient documentation

## 2012-09-02 DIAGNOSIS — M48061 Spinal stenosis, lumbar region without neurogenic claudication: Secondary | ICD-10-CM | POA: Insufficient documentation

## 2012-09-02 DIAGNOSIS — M51379 Other intervertebral disc degeneration, lumbosacral region without mention of lumbar back pain or lower extremity pain: Secondary | ICD-10-CM | POA: Insufficient documentation

## 2012-09-02 DIAGNOSIS — I517 Cardiomegaly: Secondary | ICD-10-CM | POA: Insufficient documentation

## 2012-09-02 DIAGNOSIS — M7989 Other specified soft tissue disorders: Secondary | ICD-10-CM | POA: Insufficient documentation

## 2012-09-02 DIAGNOSIS — M5137 Other intervertebral disc degeneration, lumbosacral region: Secondary | ICD-10-CM | POA: Insufficient documentation

## 2012-09-02 DIAGNOSIS — I701 Atherosclerosis of renal artery: Secondary | ICD-10-CM

## 2012-09-02 DIAGNOSIS — K829 Disease of gallbladder, unspecified: Secondary | ICD-10-CM | POA: Insufficient documentation

## 2012-09-17 ENCOUNTER — Ambulatory Visit: Payer: Medicare Other | Admitting: Internal Medicine

## 2012-09-22 ENCOUNTER — Ambulatory Visit: Payer: Medicare Other | Admitting: Internal Medicine

## 2012-09-22 ENCOUNTER — Other Ambulatory Visit: Payer: Self-pay | Admitting: *Deleted

## 2012-09-22 MED ORDER — ROSUVASTATIN CALCIUM 5 MG PO TABS: 5.0000 mg | ORAL_TABLET | Freq: Every day | ORAL | Status: DC

## 2012-09-22 MED ORDER — EZETIMIBE 10 MG PO TABS: 10.0000 mg | ORAL_TABLET | Freq: Every day | ORAL | Status: DC

## 2012-10-20 ENCOUNTER — Other Ambulatory Visit: Payer: Self-pay | Admitting: *Deleted

## 2012-10-20 MED ORDER — ROSUVASTATIN CALCIUM 5 MG PO TABS: 5.0000 mg | ORAL_TABLET | Freq: Every day | ORAL | Status: DC

## 2012-11-22 ENCOUNTER — Other Ambulatory Visit: Payer: Self-pay | Admitting: *Deleted

## 2012-12-23 ENCOUNTER — Other Ambulatory Visit: Payer: Self-pay | Admitting: *Deleted

## 2012-12-23 MED ORDER — CILOSTAZOL 100 MG PO TABS: ORAL_TABLET | ORAL | Status: DC

## 2012-12-23 NOTE — Telephone Encounter (Signed)
Rx was sent to pharmacy electronically. 

## 2013-01-01 ENCOUNTER — Emergency Department (HOSPITAL_COMMUNITY): Payer: Medicare Other

## 2013-01-01 ENCOUNTER — Encounter (HOSPITAL_COMMUNITY): Payer: Self-pay | Admitting: *Deleted

## 2013-01-01 ENCOUNTER — Other Ambulatory Visit: Payer: Self-pay

## 2013-01-01 ENCOUNTER — Inpatient Hospital Stay (HOSPITAL_COMMUNITY)
Admission: EM | Admit: 2013-01-01 | Discharge: 2013-01-05 | DRG: 684 | Disposition: A | Discharge status: Home / Self Care | Payer: Medicare Other | Attending: Internal Medicine | Admitting: Internal Medicine

## 2013-01-01 DIAGNOSIS — I10 Essential (primary) hypertension: Secondary | ICD-10-CM

## 2013-01-01 DIAGNOSIS — Z9181 History of falling: Secondary | ICD-10-CM

## 2013-01-01 DIAGNOSIS — N289 Disorder of kidney and ureter, unspecified: Secondary | ICD-10-CM

## 2013-01-01 DIAGNOSIS — M25561 Pain in right knee: Secondary | ICD-10-CM

## 2013-01-01 DIAGNOSIS — E559 Vitamin D deficiency, unspecified: Secondary | ICD-10-CM | POA: Diagnosis present

## 2013-01-01 DIAGNOSIS — N179 Acute kidney failure, unspecified: Principal | ICD-10-CM | POA: Diagnosis present

## 2013-01-01 DIAGNOSIS — Z833 Family history of diabetes mellitus: Secondary | ICD-10-CM

## 2013-01-01 DIAGNOSIS — E669 Obesity, unspecified: Secondary | ICD-10-CM | POA: Diagnosis present

## 2013-01-01 DIAGNOSIS — Z6825 Body mass index (BMI) 25.0-25.9, adult: Secondary | ICD-10-CM

## 2013-01-01 DIAGNOSIS — N183 Chronic kidney disease, stage 3 unspecified: Secondary | ICD-10-CM | POA: Diagnosis present

## 2013-01-01 DIAGNOSIS — M1990 Unspecified osteoarthritis, unspecified site: Secondary | ICD-10-CM

## 2013-01-01 DIAGNOSIS — E7800 Pure hypercholesterolemia, unspecified: Secondary | ICD-10-CM | POA: Diagnosis present

## 2013-01-01 DIAGNOSIS — G589 Mononeuropathy, unspecified: Secondary | ICD-10-CM | POA: Diagnosis present

## 2013-01-01 DIAGNOSIS — I129 Hypertensive chronic kidney disease with stage 1 through stage 4 chronic kidney disease, or unspecified chronic kidney disease: Secondary | ICD-10-CM | POA: Diagnosis present

## 2013-01-01 DIAGNOSIS — W19XXXA Unspecified fall, initial encounter: Secondary | ICD-10-CM | POA: Diagnosis present

## 2013-01-01 DIAGNOSIS — N189 Chronic kidney disease, unspecified: Secondary | ICD-10-CM

## 2013-01-01 DIAGNOSIS — D631 Anemia in chronic kidney disease: Secondary | ICD-10-CM | POA: Diagnosis present

## 2013-01-01 DIAGNOSIS — D649 Anemia, unspecified: Secondary | ICD-10-CM

## 2013-01-01 DIAGNOSIS — I2510 Atherosclerotic heart disease of native coronary artery without angina pectoris: Secondary | ICD-10-CM | POA: Diagnosis present

## 2013-01-01 DIAGNOSIS — D509 Iron deficiency anemia, unspecified: Secondary | ICD-10-CM | POA: Diagnosis present

## 2013-01-01 DIAGNOSIS — E875 Hyperkalemia: Secondary | ICD-10-CM | POA: Diagnosis present

## 2013-01-01 DIAGNOSIS — M25569 Pain in unspecified knee: Secondary | ICD-10-CM | POA: Diagnosis present

## 2013-01-01 DIAGNOSIS — N2581 Secondary hyperparathyroidism of renal origin: Secondary | ICD-10-CM | POA: Diagnosis present

## 2013-01-01 DIAGNOSIS — E119 Type 2 diabetes mellitus without complications: Secondary | ICD-10-CM | POA: Diagnosis present

## 2013-01-01 DIAGNOSIS — Z951 Presence of aortocoronary bypass graft: Secondary | ICD-10-CM

## 2013-01-01 DIAGNOSIS — E785 Hyperlipidemia, unspecified: Secondary | ICD-10-CM | POA: Diagnosis present

## 2013-01-01 DIAGNOSIS — Z993 Dependence on wheelchair: Secondary | ICD-10-CM

## 2013-01-01 HISTORY — DX: Unspecified osteoarthritis, unspecified site: M19.90

## 2013-01-01 LAB — COMPREHENSIVE METABOLIC PANEL
CO2: 20 mEq/L (ref 19–32)
Calcium: 8.8 mg/dL (ref 8.4–10.5)
Creatinine, Ser: 5.91 mg/dL — ABNORMAL HIGH (ref 0.50–1.10)
GFR calc Af Amer: 7 mL/min — ABNORMAL LOW (ref >90)
GFR calc non Af Amer: 6 mL/min — ABNORMAL LOW (ref >90)
Glucose, Bld: 134 mg/dL — ABNORMAL HIGH (ref 70–99)
Sodium: 133 mEq/L — ABNORMAL LOW (ref 135–145)
Total Protein: 6.6 g/dL (ref 6.0–8.3)

## 2013-01-01 LAB — HEPATIC FUNCTION PANEL
ALT: 17 U/L (ref 0–35)
AST: 16 U/L (ref 0–37)
Albumin: 3.2 g/dL — ABNORMAL LOW (ref 3.5–5.2)
Total Bilirubin: 0.2 mg/dL — ABNORMAL LOW (ref 0.3–1.2)

## 2013-01-01 LAB — CK: Total CK: 187 U/L — ABNORMAL HIGH (ref 7–177)

## 2013-01-01 LAB — CBC WITH DIFFERENTIAL/PLATELET
Basophils Relative: 0 % (ref 0–1)
HCT: 24.5 % — ABNORMAL LOW (ref 36.0–46.0)
Hemoglobin: 8.2 g/dL — ABNORMAL LOW (ref 12.0–15.0)
Lymphocytes Relative: 29 % (ref 12–46)
Lymphs Abs: 1.5 10*3/uL (ref 0.7–4.0)
Monocytes Absolute: 0.3 10*3/uL (ref 0.1–1.0)
Monocytes Relative: 6 % (ref 3–12)
Neutro Abs: 3.3 10*3/uL (ref 1.7–7.7)
Neutrophils Relative %: 62 % (ref 43–77)
RBC: 2.79 MIL/uL — ABNORMAL LOW (ref 3.87–5.11)
WBC: 5.3 10*3/uL (ref 4.0–10.5)

## 2013-01-01 LAB — URINE MICROSCOPIC-ADD ON

## 2013-01-01 LAB — GLUCOSE, CAPILLARY: Glucose-Capillary: 177 mg/dL — ABNORMAL HIGH (ref 70–99)

## 2013-01-01 LAB — URINALYSIS, ROUTINE W REFLEX MICROSCOPIC
Hgb urine dipstick: NEGATIVE
Specific Gravity, Urine: 1.015 (ref 1.005–1.030)
Urobilinogen, UA: 0.2 mg/dL (ref 0.0–1.0)
pH: 5.5 (ref 5.0–8.0)

## 2013-01-01 LAB — TROPONIN I: Troponin I: <0.3 ng/mL (ref <0.30)

## 2013-01-01 MED ORDER — ONDANSETRON HCL 4 MG/2ML IJ SOLN: 4.0000 mg | INTRAMUSCULAR | Status: DC | PRN

## 2013-01-01 MED ORDER — ASPIRIN EC 81 MG PO TBEC
81.0000 mg | DELAYED_RELEASE_TABLET | Freq: Every day | ORAL | Status: DC
  Administered 2013-01-01 – 2013-01-05 (×5): 81 mg via ORAL
  Filled 2013-01-01 (×5): qty 1

## 2013-01-01 MED ORDER — SORBITOL 70 % SOLN: 30.0000 mL | Freq: Every day | Status: DC | PRN

## 2013-01-01 MED ORDER — INSULIN ASPART 100 UNIT/ML ~~LOC~~ SOLN: 0.0000 [IU] | Freq: Every day | SUBCUTANEOUS | Status: DC

## 2013-01-01 MED ORDER — OXYCODONE HCL 5 MG PO TABS
5.0000 mg | ORAL_TABLET | ORAL | Status: DC | PRN
  Administered 2013-01-04: 5 mg via ORAL
  Filled 2013-01-01: qty 1

## 2013-01-01 MED ORDER — CILOSTAZOL 100 MG PO TABS
ORAL_TABLET | ORAL | Status: AC
  Filled 2013-01-01: qty 1

## 2013-01-01 MED ORDER — ATORVASTATIN CALCIUM 10 MG PO TABS
10.0000 mg | ORAL_TABLET | Freq: Every day | ORAL | Status: DC
  Administered 2013-01-02 – 2013-01-04 (×3): 10 mg via ORAL
  Filled 2013-01-01 (×3): qty 1

## 2013-01-01 MED ORDER — AMLODIPINE BESYLATE 5 MG PO TABS
10.0000 mg | ORAL_TABLET | Freq: Every morning | ORAL | Status: DC
  Administered 2013-01-02 – 2013-01-05 (×4): 10 mg via ORAL
  Filled 2013-01-01 (×4): qty 2

## 2013-01-01 MED ORDER — ACETAMINOPHEN 325 MG PO TABS
650.0000 mg | ORAL_TABLET | ORAL | Status: DC | PRN
  Filled 2013-01-01: qty 2

## 2013-01-01 MED ORDER — CILOSTAZOL 100 MG PO TABS
100.0000 mg | ORAL_TABLET | Freq: Two times a day (BID) | ORAL | Status: DC
  Administered 2013-01-02 – 2013-01-05 (×8): 100 mg via ORAL
  Filled 2013-01-01 (×8): qty 1

## 2013-01-01 MED ORDER — INSULIN ASPART 100 UNIT/ML ~~LOC~~ SOLN
0.0000 [IU] | Freq: Three times a day (TID) | SUBCUTANEOUS | Status: DC
  Administered 2013-01-02: 2 [IU] via SUBCUTANEOUS
  Administered 2013-01-03: 1 [IU] via SUBCUTANEOUS
  Administered 2013-01-03: 3 [IU] via SUBCUTANEOUS
  Administered 2013-01-04 – 2013-01-05 (×3): 1 [IU] via SUBCUTANEOUS

## 2013-01-01 MED ORDER — HEPARIN SODIUM (PORCINE) 5000 UNIT/ML IJ SOLN
5000.0000 [IU] | Freq: Three times a day (TID) | INTRAMUSCULAR | Status: DC
  Administered 2013-01-01 – 2013-01-05 (×11): 5000 [IU] via SUBCUTANEOUS
  Filled 2013-01-01 (×11): qty 1

## 2013-01-01 MED ORDER — GLIPIZIDE ER 5 MG PO TB24
5.0000 mg | ORAL_TABLET | Freq: Every day | ORAL | Status: DC
  Administered 2013-01-02 – 2013-01-05 (×4): 5 mg via ORAL
  Filled 2013-01-01 (×5): qty 1

## 2013-01-01 NOTE — ED Notes (Signed)
Family at bedside, discussed plan of care and updated to current status.  Family verbalized understanding of information given

## 2013-01-01 NOTE — ED Provider Notes (Signed)
CSN: [id number]     Arrival date & time 01/01/13  1409 History  This chart was scribed for Nat Christen, MD by Elby Beck, ED Scribe. This patient was seen in room APA12/APA12 and the patient's care was started at 3:49 PM.    Chief Complaint  Patient presents with  . Knee Pain  . Fall    The history is provided by the patient. No language interpreter was used.    HPI Comments: [name] is a 79 y.o. Female with a history of DM, HTN and anemia who presents to the Emergency Department complaining of constant, moderate right knee pain with associated swelling and bruising onset after a fall that occurred 6 days ago. She states that she had a second fall 4 days ago which worsened this pain. Pt states that she is able to walk without assistance at baseline. She believes she has had a staggered gait recently, making her prone to falls, due to experiencing dizziness from a suspected inner ear problem. Pt states that since the falls, she has been using a wheelchair. Relatives state that pt lives alone in an apartment. Relatives also state that pt's appetite has been decreased for over a month, and she has been losing an unknown amount of weight recently. Pt denies head injury, LOC or any other symptoms.  PCP- Dr. Rosita Fire   Past Medical History  Diagnosis Date  . Anemia of chronic disease 10/05/2010  . Diabetes mellitus   . Hypertension   . High cholesterol   . Neuropathy   . Anemia, iron deficiency 08/18/2012   Past Surgical History  Procedure Laterality Date  . Heart bypass surgery    . Left eye surgery     Family History  Problem Relation Age of Onset  . Diabetes Father   . Cancer Sister    History  Substance Use Topics  . Smoking status: Never Smoker   . Smokeless tobacco: Never Used  . Alcohol Use: Yes     Comment: in past drank brandy   OB History   Grav Para Term Preterm Abortions TAB SAB Ect Mult Living                 Review of Systems A complete 10  system review of systems was obtained and all systems are negative except as noted in the HPI and PMH.   Allergies  Review of patient's allergies indicates no known allergies.  Home Medications   Current Outpatient Rx  Name  Route  Sig  Dispense  Refill  . cilostazol (PLETAL) 100 MG tablet      Take 1 tablet (100mg ) by mouth twice daily. Please schedule office visit for future refills.   30 tablet   0     PATIENT NEED OFFICE VISIT   . enalapril (VASOTEC) 20 MG tablet   Oral   Take 20 mg by mouth 2 (two) times daily.           Marland Kitchen gabapentin (NEURONTIN) 300 MG capsule   Oral   Take 300 mg by mouth daily.           Marland Kitchen glucose 4 GM chewable tablet   Oral   Chew 16 g by mouth as needed.           . Multiple Vitamin (DAILY VITE) TABS   Oral   Take 1 tablet by mouth daily.         . rosuvastatin (CRESTOR) 5 MG tablet   Oral  Take 1 tablet (5 mg total) by mouth daily. Need appointment   30 tablet   0     PATIENT NEED OFFICE VISIT    Triage Vitals: BP 153/50  Pulse 78  Temp(Src) 98.1 F (36.7 C) (Oral)  Resp 18  Ht 5\' 6"  (1.676 m)  Wt 160 lb (72.576 kg)  BMI 25.84 kg/m2  SpO2 99%  Physical Exam  Nursing note and vitals reviewed. Constitutional: She is oriented to person, place, and time. She appears well-developed and well-nourished.  HENT:  Head: Normocephalic and atraumatic.  Eyes: Conjunctivae and EOM are normal. Pupils are equal, round, and reactive to light.  Neck: Normal range of motion. Neck supple.  Cardiovascular: Normal rate, regular rhythm and normal heart sounds.   Pulmonary/Chest: Effort normal and breath sounds normal.  Abdominal: Soft. Bowel sounds are normal.  Musculoskeletal: Normal range of motion.  Tenderness in right distal posterior thigh as well as the anterior proximal tibia.   Neurological: She is alert and oriented to person, place, and time.  Skin: Skin is warm and dry.  Psychiatric: She has a normal mood and affect.    ED  Course  Procedures (including critical care time)  DIAGNOSTIC STUDIES: Oxygen Saturation is 99% on RA, normal by my interpretation.    COORDINATION OF CARE: 3:56 PM- Discussed plan to obtain blood work, a urine sample, CXR and an X-ray of pt's right knee. Pt advised of plan for treatment and pt agrees.  Labs Review Labs Reviewed  COMPREHENSIVE METABOLIC PANEL - Abnormal; Notable for the following:    Sodium 133 (*)    Potassium 5.7 (*)    Glucose, Bld 134 (*)    BUN 106 (*)    Creatinine, Ser 5.91 (*)    Albumin 3.2 (*)    Total Bilirubin 0.2 (*)    GFR calc non Af Amer 6 (*)    GFR calc Af Amer 7 (*)    All other components within normal limits  CBC WITH DIFFERENTIAL - Abnormal; Notable for the following:    RBC 2.79 (*)    Hemoglobin 8.2 (*)    HCT 24.5 (*)    All other components within normal limits  URINALYSIS, ROUTINE W REFLEX MICROSCOPIC - Abnormal; Notable for the following:    Leukocytes, UA TRACE (*)    All other components within normal limits  URINE MICROSCOPIC-ADD ON - Abnormal; Notable for the following:    Squamous Epithelial / LPF MANY (*)    Bacteria, UA FEW (*)    All other components within normal limits  URINE CULTURE  TROPONIN I   Imaging Review Dg Chest 2 View  01/01/2013   CLINICAL DATA:  Shortness of breath and fall.  EXAM: CHEST - 2 VIEW  COMPARISON:  07/18/2011  FINDINGS: Stable mild cardiac enlargement status post prior CABG. There is no evidence of pulmonary edema, consolidation, pneumothorax, nodule or pleural fluid. The bony thorax shows no significant abnormalities.  IMPRESSION: No active disease.  Stable mild cardiomegaly.   Electronically Signed   By: Aletta Edouard M.D.   On: 01/01/2013 16:30   Dg Knee Complete 4 Views Right  01/01/2013   CLINICAL DATA:  Fall with right knee pain.  EXAM: RIGHT KNEE - COMPLETE 4+ VIEW  COMPARISON:  None.  FINDINGS: No acute fracture, dislocation or joint effusion is identified. Proliferative changes are seen  involving the patella. Chondrocalcinosis is seen involving medial and lateral joint spaces. Extensive vascular calcifications are identified related to diabetes. No  bony lesions or destruction are present.  IMPRESSION: No acute fracture. Degenerative changes are present.   Electronically Signed   By: Aletta Edouard M.D.   On: 01/01/2013 15:54    Date: 01/01/2013  Rate: 73  Rhythm: normal sinus rhythm  QRS Axis: normal  Intervals: normal  ST/T Wave abnormalities: normal  Conduction Disutrbances: none  Narrative Interpretation: unremarkable   MDM   1. Anemia   2. Right knee pain   3. Renal insufficiency    BUN and creatinine are now elevated along with a hemoglobin of 8.2      Admit to workup etiology of renal insufficiency and anemia  I personally performed the services described in this documentation, which was scribed in my presence. The recorded information has been reviewed and is accurate.    Nat Christen, MD 01/01/13 [phone]

## 2013-01-01 NOTE — H&P (Signed)
[name] History and Physical  [name]  [id number]  DOB: 1931/02/13   DOA: 01/01/2013   PCP:   Stacey Fire, MD   Chief Complaint:  Recurrent falls  HPI: [name] is a 79 y.o. female.   Elderly African American lady with a history, diabetes hypertension and of chronic kidney reports a history of chronic left current falls typically associated with her osteoarthritis, but fell while on vacation 6 days ago, hit her right knee, and has been having recurrent gait instability and  Pain since then. On returning to [place] her family insisted that she come to the emergency room for evaluation. Her creatinine was found to be markedly elevated above baseline.  She reports use of Aleve twice in the past 2 days but not prior to the onset of her falling.  She reports that she requested Aricept from her primary care physician over the past month because she felt her memory wasn't good enough  She is status post CABG in 2008  Rewiew of Systems:   All systems negative except as marked bold or noted in the HPI;  Constitutional:    malaise, fever and chills. ;  Eyes:   eye pain, redness and discharge. ;  ENMT:   ear pain, hoarseness, nasal congestion, sinus pressure and sore throat. ;  Cardiovascular:    chest pain, palpitations, diaphoresis, dyspnea on exertion and peripheral edema, episodic.  Respiratory:   cough, hemoptysis, wheezing and stridor. ;  Gastrointestinal:  nausea, vomiting, diarrhea, constipation, abdominal pain, melena, blood in stool, hematemesis, jaundice and rectal bleeding. unusual weight loss..   Genitourinary:    frequency, dysuria, incontinence,flank pain and hematuria; Musculoskeletal:   back pain with long standing and neck pain.  righ knee pain after fall; chrnoic left knee swelling;  chrnoic right shoulder pain Skin: .  pruritus, rash, abrasions, bruising and skin lesion.; ulcerations Neuro:    headache, lightheadedness past few days and  neck stiffness.  weakness, altered level of consciousness, altered mental status, extremity weakness, burning feet, involuntary movement, seizure and syncope.  Psych:    anxiety, depression, insomnia, tearfulness, panic attacks, hallucinations, paranoia, suicidal or homicidal ideation    Past Medical History  Diagnosis Date  . Anemia of chronic disease 10/05/2010  . Diabetes mellitus   . Hypertension   . High cholesterol   . Neuropathy   . Anemia, iron deficiency 08/18/2012  . Arthritis   . Chronic kidney disease   . Shortness of breath     Past Surgical History  Procedure Laterality Date  . Heart bypass surgery    . Left eye surgery    . Eye surgery      Medications:  HOME MEDS: Prior to Admission medications   Medication Sig Start Date End Date Taking? Authorizing Provider  amLODipine (NORVASC) 10 MG tablet Take 10 mg by mouth every morning. For BLOOD PRESSURE   Yes Historical Provider, MD  cilostazol (PLETAL) 100 MG tablet Take 100 mg by mouth 2 (two) times daily. For CIRCULATION or AS DIRECTED by your physician   Yes Historical Provider, MD  donepezil (ARICEPT) 10 MG tablet Take 10 mg by mouth at bedtime. For MEMORY   Yes Historical Provider, MD  enalapril (VASOTEC) 20 MG tablet Take 20 mg by mouth 2 (two) times daily. For BLOOD PRESSURE   Yes Historical Provider, MD  ferrous sulfate 325 (65 FE) MG tablet Take 325 mg by mouth 2 (two) times daily. As SUPPLEMENT   Yes Historical Provider, MD  gabapentin (NEURONTIN) 300 MG capsule Take 300 mg by mouth at bedtime. For NEUROPATHY/NERVE PAIN   Yes Historical Provider, MD  glipiZIDE (GLUCOTROL XL) 5 MG 24 hr tablet Take 5 mg by mouth daily. For BLOOD SUGARS/DIABETES   Yes Historical Provider, MD  HYDROcodone-acetaminophen (NORCO/VICODIN) 5-325 MG per tablet Take 1 tablet by mouth 2 (two) times daily. For PAIN   Yes Historical Provider, MD  linagliptin (TRADJENTA) 5 MG TABS tablet Take 5 mg by mouth every morning. For BLOOD  SUGARS/DIABETES   Yes Historical Provider, MD  Multiple Vitamin (DAILY VITE) TABS Take 1 tablet by mouth daily.   Yes Historical Provider, MD  rosuvastatin (CRESTOR) 5 MG tablet Take 5 mg by mouth every morning. For CHOLESTEROL   Yes Historical Provider, MD  torsemide (DEMADEX) 10 MG tablet Take 10-20 mg by mouth 2 (two) times daily. Take two tablets in the morning and one tablet at bedtime for FLUID   Yes Historical Provider, MD  glucose 4 GM chewable tablet Chew 16 g by mouth as needed for low blood sugar.     Historical Provider, MD     Allergies:  No Known Allergies  Social History:   reports that she has never smoked. She has never used smokeless tobacco. She reports that she does not drink alcohol or use illicit drugs.  Family History: Family History  Problem Relation Age of Onset  . Diabetes Father   . Cancer Sister   . Aneurysm Mother      Physical Exam: Filed Vitals:   01/01/13 1425 01/01/13 1716 01/01/13 2010  BP: 153/50 126/57 111/75  Pulse: 78 74 80  Temp: 98.1 F (36.7 C)  98.4 F (36.9 C)  TempSrc: Oral  Oral  Resp: 18 18 20   Height: 5\' 6"  (1.676 m)  5\' 6"  (1.676 m)  Weight: 72.576 kg (160 lb)  72 kg (158 lb 11.7 oz)  SpO2: 99% 100% 100%   Blood pressure 111/75, pulse 80, temperature 98.4 F (36.9 C), temperature source Oral, resp. rate 20, height 5\' 6"  (1.676 m), weight 72 kg (158 lb 11.7 oz), SpO2 100.00%. Body mass index is 25.63 kg/(m^2).   GEN:  Pleasant elderly African American lady lying bed, complaining that the bandage around her R. knee makes the pain worse ; cooperative with exam PSYCH:  alert and oriented x4;  neither anxious nor depressed; affect is appropriate. HEENT: Mucous membranes pink and anicteric; PERRLA; EOM intact; left corneal scar;  Breasts:: Not examined CHEST WALL: No tenderness; CHEST: Normal respiration, clear to auscultation bilaterally HEART: Regular rate and rhythm; high-pitched 3/6 systolic murmur left upper sternal  border ABDOMEN: Obese, soft non-tender; no masses, no organomegaly, normal abdominal bowel sounds;  Rectal Exam: Not done EXTREMITIES:  arthritic left knee with large effusion ; arthritic right knee with overlying bruise and abrasion;  no edema;  Genitalia: not examined PULSES: 2+ and symmetric SKIN: Normal hydration no rash or ulceration CNS: Cranial nerves 2-12 grossly intact no focal lateralizing neurologic deficit   Labs on Admission:  Basic Metabolic Panel:  Recent Labs Lab 01/01/13 1656  NA 133*  K 5.7*  CL 102  CO2 20  GLUCOSE 134*  BUN 106*  CREATININE 5.91*  CALCIUM 8.8   Liver Function Tests:  Recent Labs Lab 01/01/13 1656  AST 15  ALT 17  ALKPHOS 113  BILITOT 0.2*  PROT 6.6  ALBUMIN 3.2*   No results found for this basename: LIPASE, AMYLASE,  in the last 168 hours No results found  for this basename: AMMONIA,  in the last 168 hours CBC:  Recent Labs Lab 01/01/13 1656  WBC 5.3  NEUTROABS 3.3  HGB 8.2*  HCT 24.5*  MCV 87.8  PLT 167   Cardiac Enzymes:  Recent Labs Lab 01/01/13 1656  TROPONINI <0.30   BNP: No components found with this basename: POCBNP,  D-dimer: No components found with this basename: D-DIMER,  CBG: No results found for this basename: GLUCAP,  in the last 168 hours  Radiological Exams on Admission: Dg Chest 2 View  01/01/2013   CLINICAL DATA:  Shortness of breath and fall.  EXAM: CHEST - 2 VIEW  COMPARISON:  07/18/2011  FINDINGS: Stable mild cardiac enlargement status post prior CABG. There is no evidence of pulmonary edema, consolidation, pneumothorax, nodule or pleural fluid. The bony thorax shows no significant abnormalities.  IMPRESSION: No active disease.  Stable mild cardiomegaly.   Electronically Signed   By: Aletta Edouard M.D.   On: 01/01/2013 16:30   Dg Knee Complete 4 Views Right  01/01/2013   CLINICAL DATA:  Fall with right knee pain.  EXAM: RIGHT KNEE - COMPLETE 4+ VIEW  COMPARISON:  None.  FINDINGS: No acute  fracture, dislocation or joint effusion is identified. Proliferative changes are seen involving the patella. Chondrocalcinosis is seen involving medial and lateral joint spaces. Extensive vascular calcifications are identified related to diabetes. No bony lesions or destruction are present.  IMPRESSION: No acute fracture. Degenerative changes are present.   Electronically Signed   By: Aletta Edouard M.D.   On: 01/01/2013 15:54    EKG: Independently reviewed.  normal sinus rhythm no ST segment changes    Assessment/Plan   Active Problems:   Acute on chronic renal failure, hyperkalemia   Anemia in chronic renal disease   Osteoarthritis   Right knee pain   Diabetes mellitus, type 2   HTN (hypertension)   Other and unspecified hyperlipidemia   Hx of CABG   PLAN:  admit for hydration and evaluation by nephrologist in morn Sliding scale insulin diabetes Check serum B12 since it is borderline few years ago Pain management    Other plans as per orders.  Code Status:  full code  Family Communication:  Plans discuss with patientnofamily at bedside    Skippy [place]hef[place] Nocturnist [name] Pager [phone]   01/01/2013, 9:13 PM

## 2013-01-01 NOTE — ED Notes (Addendum)
[place] forward walking last Sunday onto R knee.  [place] again w/same mechanism of injury on Monday.  Has been more short of breath recently and reports having more falls at home.  Post patellar bruising and fluid collection w/tenderness to palpation.  Reports cramping in hands and legs.  Reports feeling like her blood sugar drops at night when she gets up and she feel disoriented.

## 2013-01-02 ENCOUNTER — Inpatient Hospital Stay (HOSPITAL_COMMUNITY): Payer: Medicare Other

## 2013-01-02 LAB — GLUCOSE, CAPILLARY
Glucose-Capillary: 114 mg/dL — ABNORMAL HIGH (ref 70–99)
Glucose-Capillary: 111 mg/dL — ABNORMAL HIGH (ref 70–99)

## 2013-01-02 LAB — VITAMIN B12: Vitamin B-12: 364 pg/mL (ref 211–911)

## 2013-01-02 LAB — CBC
HCT: 24.5 % — ABNORMAL LOW (ref 36.0–46.0)
Platelets: 180 10*3/uL (ref 150–400)
RDW: 14.2 % (ref 11.5–15.5)
WBC: 4.7 10*3/uL (ref 4.0–10.5)

## 2013-01-02 LAB — BASIC METABOLIC PANEL
BUN: 99 mg/dL — ABNORMAL HIGH (ref 6–23)
CO2: 21 mEq/L (ref 19–32)
Calcium: 9.1 mg/dL (ref 8.4–10.5)
Chloride: 108 mEq/L (ref 96–112)
Creatinine, Ser: 5.06 mg/dL — ABNORMAL HIGH (ref 0.50–1.10)
GFR calc Af Amer: 9 mL/min — ABNORMAL LOW (ref >90)
GFR calc non Af Amer: 7 mL/min — ABNORMAL LOW (ref >90)
Glucose, Bld: 123 mg/dL — ABNORMAL HIGH (ref 70–99)
Potassium: 6.2 mEq/L — ABNORMAL HIGH (ref 3.5–5.1)
Sodium: 140 mEq/L (ref 135–145)

## 2013-01-02 LAB — TSH: TSH: 0.332 u[IU]/mL — ABNORMAL LOW (ref 0.350–4.500)

## 2013-01-02 MED ORDER — SODIUM CHLORIDE 0.9 % IV SOLN
INTRAVENOUS | Status: DC
  Administered 2013-01-02 – 2013-01-05 (×8): via INTRAVENOUS

## 2013-01-02 MED ORDER — SODIUM POLYSTYRENE SULFONATE 15 GM/60ML PO SUSP
30.0000 g | Freq: Once | ORAL | Status: AC
  Administered 2013-01-02: 30 g via ORAL
  Filled 2013-01-02: qty 120

## 2013-01-02 NOTE — Progress Notes (Signed)
Hypoglycemic Event  CBG: 59  Treatment: 15 GM carbohydrate snack  Symptoms: None  Follow-up CBG: Time:1700 CBG Result:111  Possible Reasons for Event: medications and inadequate food intake  Comments/MD notified:    Oren Bracket L  Remember to initiate Hypoglycemia Order Set & complete

## 2013-01-02 NOTE — Progress Notes (Signed)
Pt.'s potassium went up to 6.2.  I notified Dr. Legrand Rams and he ordered stat telemetry, 12 lead EKG, and kaexylate.  EKG was taken, telemetry monitor was placed and CCMD was notified.  Kaexylate was given.  Will continue to monitor pt. Syliva Overman

## 2013-01-02 NOTE — Consult Note (Signed)
Reason for Consult: Hyperkalemia and worsening of renal failure Referring Physician: Dr. [name] is an 79 y.o. female.  HPI: She is a patient was history of hypertension, diabetes, coronary artery disease and history of chronic renal failure presently came with complaints of knee pain after falling down. Patient says that she was feeling somewhat weak and also had unstable gait for some time. About a week ago she fell down and had her knee. Since then she became wheelchair bound. Presently patient denies any nausea no vomiting and appetite is good. Patient also denies any difficulty breathing. Patient says that she has problem with her kidneys but doesn't remember the extent of her kidney failure.  Past Medical History  Diagnosis Date  . Anemia of chronic disease 10/05/2010  . Diabetes mellitus   . Hypertension   . High cholesterol   . Neuropathy   . Anemia, iron deficiency 08/18/2012  . Arthritis   . Chronic kidney disease   . Shortness of breath     Past Surgical History  Procedure Laterality Date  . Heart bypass surgery    . Left eye surgery    . Eye surgery    . Coronary artery bypass graft      Family History  Problem Relation Age of Onset  . Diabetes Father   . Cancer Sister   . Aneurysm Mother     Social History:  reports that she has never smoked. She has never used smokeless tobacco. She reports that she does not drink alcohol or use illicit drugs.  Allergies: No Known Allergies  Medications: I have reviewed the patient's current medications.  Results for orders placed during the hospital encounter of 01/01/13 (from the past 48 hour(s))  URINALYSIS, ROUTINE W REFLEX MICROSCOPIC     Status: Abnormal   Collection Time    01/01/13  3:00 PM      Result Value Range   Color, Urine YELLOW  YELLOW   APPearance CLEAR  CLEAR   Specific Gravity, Urine 1.015  1.005 - 1.030   pH 5.5  5.0 - 8.0   Glucose, UA NEGATIVE  NEGATIVE mg/dL   Hgb urine dipstick  NEGATIVE  NEGATIVE   Bilirubin Urine NEGATIVE  NEGATIVE   Ketones, ur NEGATIVE  NEGATIVE mg/dL   Protein, ur NEGATIVE  NEGATIVE mg/dL   Urobilinogen, UA 0.2  0.0 - 1.0 mg/dL   Nitrite NEGATIVE  NEGATIVE   Leukocytes, UA TRACE (*) NEGATIVE  URINE MICROSCOPIC-ADD ON     Status: Abnormal   Collection Time    01/01/13  3:00 PM      Result Value Range   Squamous Epithelial / LPF MANY (*) RARE   WBC, UA 3-6  <3 WBC/hpf   RBC / HPF 0-2  <3 RBC/hpf   Bacteria, UA FEW (*) RARE  COMPREHENSIVE METABOLIC PANEL     Status: Abnormal   Collection Time    01/01/13  4:56 PM      Result Value Range   Sodium 133 (*) 135 - 145 mEq/L   Potassium 5.7 (*) 3.5 - 5.1 mEq/L   Chloride 102  96 - 112 mEq/L   CO2 20  19 - 32 mEq/L   Glucose, Bld 134 (*) 70 - 99 mg/dL   BUN 106 (*) 6 - 23 mg/dL   Creatinine, Ser 5.91 (*) 0.50 - 1.10 mg/dL   Calcium 8.8  8.4 - 10.5 mg/dL   Total Protein 6.6  6.0 - 8.3 g/dL  Albumin 3.2 (*) 3.5 - 5.2 g/dL   AST 15  0 - 37 U/L   ALT 17  0 - 35 U/L   Alkaline Phosphatase 113  39 - 117 U/L   Total Bilirubin 0.2 (*) 0.3 - 1.2 mg/dL   GFR calc non Af Amer 6 (*) >90 mL/min   GFR calc Af Amer 7 (*) >90 mL/min   Comment: (NOTE)     The eGFR has been calculated using the CKD EPI equation.     This calculation has not been validated in all clinical situations.     eGFR's persistently <90 mL/min signify possible Chronic Kidney     Disease.  CBC WITH DIFFERENTIAL     Status: Abnormal   Collection Time    01/01/13  4:56 PM      Result Value Range   WBC 5.3  4.0 - 10.5 K/uL   RBC 2.79 (*) 3.87 - 5.11 MIL/uL   Hemoglobin 8.2 (*) 12.0 - 15.0 g/dL   HCT 24.5 (*) 36.0 - 46.0 %   MCV 87.8  78.0 - 100.0 fL   MCH 29.4  26.0 - 34.0 pg   MCHC 33.5  30.0 - 36.0 g/dL   RDW 14.1  11.5 - 15.5 %   Platelets 167  150 - 400 K/uL   Neutrophils Relative % 62  43 - 77 %   Neutro Abs 3.3  1.7 - 7.7 K/uL   Lymphocytes Relative 29  12 - 46 %   Lymphs Abs 1.5  0.7 - 4.0 K/uL   Monocytes  Relative 6  3 - 12 %   Monocytes Absolute 0.3  0.1 - 1.0 K/uL   Eosinophils Relative 3  0 - 5 %   Eosinophils Absolute 0.2  0.0 - 0.7 K/uL   Basophils Relative 0  0 - 1 %   Basophils Absolute 0.0  0.0 - 0.1 K/uL  TROPONIN I     Status: None   Collection Time    01/01/13  4:56 PM      Result Value Range   Troponin I <0.30  <0.30 ng/mL   Comment:            Due to the release kinetics of cTnI,     a negative result within the first hours     of the onset of symptoms does not rule out     myocardial infarction with certainty.     If myocardial infarction is still suspected,     repeat the test at appropriate intervals.  PHOSPHORUS     Status: Abnormal   Collection Time    01/01/13  4:56 PM      Result Value Range   Phosphorus 6.1 (*) 2.3 - 4.6 mg/dL  URIC ACID     Status: Abnormal   Collection Time    01/01/13  4:56 PM      Result Value Range   Uric Acid, Serum 7.8 (*) 2.4 - 7.0 mg/dL  CK     Status: Abnormal   Collection Time    01/01/13  4:56 PM      Result Value Range   Total CK 187 (*) 7 - 177 U/L  HEPATIC FUNCTION PANEL     Status: Abnormal   Collection Time    01/01/13  4:56 PM      Result Value Range   Total Protein 6.7  6.0 - 8.3 g/dL   Albumin 3.2 (*) 3.5 - 5.2 g/dL   AST 16  0 - 37 U/L   ALT 17  0 - 35 U/L   Alkaline Phosphatase 114  39 - 117 U/L   Total Bilirubin 0.2 (*) 0.3 - 1.2 mg/dL   Bilirubin, Direct 0.1  0.0 - 0.3 mg/dL   Indirect Bilirubin 0.1 (*) 0.3 - 0.9 mg/dL  MAGNESIUM     Status: None   Collection Time    01/01/13  4:56 PM      Result Value Range   Magnesium 2.2  1.5 - 2.5 mg/dL  GLUCOSE, CAPILLARY     Status: Abnormal   Collection Time    01/01/13 11:05 PM      Result Value Range   Glucose-Capillary 177 (*) 70 - 99 mg/dL  BASIC METABOLIC PANEL     Status: Abnormal   Collection Time    01/02/13  6:41 AM      Result Value Range   Sodium 140  135 - 145 mEq/L   Comment: DELTA CHECK NOTED   Potassium 6.2 (*) 3.5 - 5.1 mEq/L   Chloride  108  96 - 112 mEq/L   CO2 21  19 - 32 mEq/L   Glucose, Bld 123 (*) 70 - 99 mg/dL   BUN 99 (*) 6 - 23 mg/dL   Creatinine, Ser 5.06 (*) 0.50 - 1.10 mg/dL   Calcium 9.1  8.4 - 10.5 mg/dL   GFR calc non Af Amer 7 (*) >90 mL/min   GFR calc Af Amer 9 (*) >90 mL/min   Comment: (NOTE)     The eGFR has been calculated using the CKD EPI equation.     This calculation has not been validated in all clinical situations.     eGFR's persistently <90 mL/min signify possible Chronic Kidney     Disease.  CBC     Status: Abnormal   Collection Time    01/02/13  6:41 AM      Result Value Range   WBC 4.7  4.0 - 10.5 K/uL   RBC 2.77 (*) 3.87 - 5.11 MIL/uL   Hemoglobin 8.0 (*) 12.0 - 15.0 g/dL   HCT 24.5 (*) 36.0 - 46.0 %   MCV 88.4  78.0 - 100.0 fL   MCH 28.9  26.0 - 34.0 pg   MCHC 32.7  30.0 - 36.0 g/dL   RDW 14.2  11.5 - 15.5 %   Platelets 180  150 - 400 K/uL  GLUCOSE, CAPILLARY     Status: Abnormal   Collection Time    01/02/13  7:45 AM      Result Value Range   Glucose-Capillary 114 (*) 70 - 99 mg/dL   Comment 1 Notify RN     Comment 2 Documented in Chart      Dg Chest 2 View  01/01/2013   CLINICAL DATA:  Shortness of breath and fall.  EXAM: CHEST - 2 VIEW  COMPARISON:  07/18/2011  FINDINGS: Stable mild cardiac enlargement status post prior CABG. There is no evidence of pulmonary edema, consolidation, pneumothorax, nodule or pleural fluid. The bony thorax shows no significant abnormalities.  IMPRESSION: No active disease.  Stable mild cardiomegaly.   Electronically Signed   By: Aletta Edouard M.D.   On: 01/01/2013 16:30   Dg Knee Complete 4 Views Right  01/01/2013   CLINICAL DATA:  Fall with right knee pain.  EXAM: RIGHT KNEE - COMPLETE 4+ VIEW  COMPARISON:  None.  FINDINGS: No acute fracture, dislocation or joint effusion is identified. Proliferative changes are seen involving the  patella. Chondrocalcinosis is seen involving medial and lateral joint spaces. Extensive vascular calcifications are  identified related to diabetes. No bony lesions or destruction are present.  IMPRESSION: No acute fracture. Degenerative changes are present.   Electronically Signed   By: Aletta Edouard M.D.   On: 01/01/2013 15:54    Review of Systems  Constitutional: Positive for malaise/fatigue.  Respiratory: Negative for cough and shortness of breath.   Cardiovascular: Negative for orthopnea and leg swelling.  Gastrointestinal: Negative for nausea, vomiting and diarrhea.  Musculoskeletal: Positive for joint pain and falls.  Neurological: Positive for weakness. Negative for dizziness.   Blood pressure 125/41, pulse 70, temperature 98.4 F (36.9 C), temperature source Oral, resp. rate 16, height 5\' 6"  (1.676 m), weight 71.215 kg (157 lb), SpO2 100.00%. Physical Exam  Constitutional: She is oriented to person, place, and time.  Eyes: No scleral icterus.  Neck: No JVD present.  Cardiovascular: Normal rate, regular rhythm and normal heart sounds.   No murmur heard. Respiratory: She has no wheezes. She has no rales.  GI: She exhibits no distension. There is no tenderness. There is no rebound.  Musculoskeletal: She exhibits no edema.  Neurological: She is alert and oriented to person, place, and time.    Assessment/Plan: Problem #1 renal failure presently her acute on chronic her BUN and creatinine seems to be slightly better. Patient presently does not have any uremic sign and  symptoms. Problem #2 hyperkalemia. Her potassium is 6.2 seems to worsening. Problem #3 history of anemia. This could be iron deficiency and anemia of chronic disease. Problem #4 history of CAD  status post CABG Problem #5 history of right knee pain secondary to fall. Problem #6 history of diabetes Problem #7 history of osteoarthritis Problem #8 history of hypertension Plan: Increase her IV fluid to 135 cc per hour We'll give it to Kayoxalate 30 g one dose. We'll check her basic metabolic panel, phosphorus, intact PTH and 25  vitamin D. We'll check Iron studies. We'll check ultrasound of the kidneys. [name] S 01/02/2013, 9:30 AM

## 2013-01-02 NOTE — Progress Notes (Signed)
Subjective: Patient was admitted yesterday due to generalized weakness and recurrent fall. She was found to have acute on chronic renal failure with hyperkalemia.  Objective: Vital signs in last 24 hours: Temp:  [98.1 F (36.7 C)-98.4 F (36.9 C)] 98.4 F (36.9 C) (10/05 0500) Pulse Rate:  [70-80] 70 (10/05 0500) Resp:  [16-20] 16 (10/05 0500) BP: (111-153)/(41-75) 125/41 mmHg (10/05 0500) SpO2:  [99 %-100 %] 100 % (10/05 0500) Weight:  [71.215 kg (157 lb)-72.576 kg (160 lb)] 71.215 kg (157 lb) (10/05 0500) Weight change:  Last BM Date: 01/01/13  Intake/Output from previous day: 10/04 0701 - 10/05 0700 In: -  Out: 300 [Urine:300]  PHYSICAL EXAM General appearance: alert and slowed mentation Resp: clear to auscultation bilaterally Cardio: S1, S2 normal GI: soft, non-tender; bowel sounds normal; no masses,  no organomegaly Extremities: tenderness of the knees  Lab Results:    @labtest @ [facility] No results found for this basename: PHART, PCO2, PO2ART, TCO2, HCO3,  in the last 72 hours CULTURES No results found for this or any previous visit (from the past 240 hour(s)). Studies/Results: Dg Chest 2 View  01/01/2013   CLINICAL DATA:  Shortness of breath and fall.  EXAM: CHEST - 2 VIEW  COMPARISON:  07/18/2011  FINDINGS: Stable mild cardiac enlargement status post prior CABG. There is no evidence of pulmonary edema, consolidation, pneumothorax, nodule or pleural fluid. The bony thorax shows no significant abnormalities.  IMPRESSION: No active disease.  Stable mild cardiomegaly.   Electronically Signed   By: Aletta Edouard M.D.   On: 01/01/2013 16:30   Dg Knee Complete 4 Views Right  01/01/2013   CLINICAL DATA:  Fall with right knee pain.  EXAM: RIGHT KNEE - COMPLETE 4+ VIEW  COMPARISON:  None.  FINDINGS: No acute fracture, dislocation or joint effusion is identified. Proliferative changes are seen involving the patella. Chondrocalcinosis is seen involving medial and lateral joint  spaces. Extensive vascular calcifications are identified related to diabetes. No bony lesions or destruction are present.  IMPRESSION: No acute fracture. Degenerative changes are present.   Electronically Signed   By: Aletta Edouard M.D.   On: 01/01/2013 15:54    Medications: I have reviewed the patient's current medications.  Assesment: Active Problems:   Acute on chronic renal failure   Anemia in chronic renal disease   Osteoarthritis   Right knee pain   Diabetes mellitus, type 2   HTN (hypertension)   Other and unspecified hyperlipidemia   Hx of CABG    Plan: Medications reviewed Continue IV fluid Nephrology consult appreciated  Will monitor BMp    LOS: 1 day   Jenesa Foresta 01/02/2013, 9:51 AM

## 2013-01-03 LAB — PHOSPHORUS: Phosphorus: 4.4 mg/dL (ref 2.3–4.6)

## 2013-01-03 LAB — GLUCOSE, CAPILLARY
Glucose-Capillary: 124 mg/dL — ABNORMAL HIGH (ref 70–99)
Glucose-Capillary: 202 mg/dL — ABNORMAL HIGH (ref 70–99)
Glucose-Capillary: 117 mg/dL — ABNORMAL HIGH (ref 70–99)

## 2013-01-03 LAB — CBC
MCV: 87.8 fL (ref 78.0–100.0)
Platelets: 156 10*3/uL (ref 150–400)
RBC: 2.71 MIL/uL — ABNORMAL LOW (ref 3.87–5.11)
RDW: 14.2 % (ref 11.5–15.5)
WBC: 4.8 10*3/uL (ref 4.0–10.5)

## 2013-01-03 LAB — BASIC METABOLIC PANEL
BUN: 68 mg/dL — ABNORMAL HIGH (ref 6–23)
CO2: 21 mEq/L (ref 19–32)
Calcium: 8.6 mg/dL (ref 8.4–10.5)
GFR calc Af Amer: 14 mL/min — ABNORMAL LOW (ref >90)
GFR calc non Af Amer: 12 mL/min — ABNORMAL LOW (ref >90)
Sodium: 144 mEq/L (ref 135–145)

## 2013-01-03 LAB — PTH, INTACT AND CALCIUM: Calcium, Total (PTH): 8.6 mg/dL (ref 8.4–10.5)

## 2013-01-03 LAB — FERRITIN: Ferritin: 474 ng/mL — ABNORMAL HIGH (ref 10–291)

## 2013-01-03 LAB — URINE CULTURE

## 2013-01-03 LAB — VITAMIN D 25 HYDROXY (VIT D DEFICIENCY, FRACTURES): Vit D, 25-Hydroxy: 13 ng/mL — ABNORMAL LOW (ref 30–89)

## 2013-01-03 NOTE — Progress Notes (Signed)
Subjective: Interval History: has no complaint of nausea or vomiting. Patient presently denies any difficulty in breathing. Overall she says that she's feeling much better..  Objective: Vital signs in last 24 hours: Temp:  [98.2 F (36.8 C)-98.4 F (36.9 C)] 98.2 F (36.8 C) (10/06 0658) Pulse Rate:  [73-82] 76 (10/06 0658) Resp:  [17-19] 19 (10/06 0658) BP: (127-165)/(43-63) 134/62 mmHg (10/06 0658) SpO2:  [98 %-100 %] 100 % (10/06 0658) Weight change:   Intake/Output from previous day: 10/05 0701 - 10/06 0700 In: 3110 [P.O.:440; I.V.:2670] Out: 1200 [Urine:1200] Intake/Output this shift:    General appearance: alert, cooperative and no distress Resp: clear to auscultation bilaterally Cardio: regular rate and rhythm, S1, S2 normal, no murmur, click, rub or gallop GI: soft, non-tender; bowel sounds normal; no masses,  no organomegaly Extremities: extremities normal, atraumatic, no cyanosis or edema  Lab Results:  Recent Labs  01/02/13 0641 01/03/13 0631  WBC 4.7 4.8  HGB 8.0* 8.0*  HCT 24.5* 23.8*  PLT 180 156   BMET:  Recent Labs  01/02/13 0641 01/03/13 0631  NA 140 144  K 6.2* 4.6  CL 108 115*  CO2 21 21  GLUCOSE 123* 113*  BUN 99* 68*  CREATININE 5.06* 3.41*  CALCIUM 9.1 8.6   No results found for this basename: PTH,  in the last 72 hours Iron Studies: No results found for this basename: IRON, TIBC, TRANSFERRIN, FERRITIN,  in the last 72 hours  Studies/Results: Dg Chest 2 View  01/01/2013   CLINICAL DATA:  Shortness of breath and fall.  EXAM: CHEST - 2 VIEW  COMPARISON:  07/18/2011  FINDINGS: Stable mild cardiac enlargement status post prior CABG. There is no evidence of pulmonary edema, consolidation, pneumothorax, nodule or pleural fluid. The bony thorax shows no significant abnormalities.  IMPRESSION: No active disease.  Stable mild cardiomegaly.   Electronically Signed   By: Aletta Edouard M.D.   On: 01/01/2013 16:30   US Renal  01/02/2013    *RADIOLOGY REPORT*  Clinical Data: Worsening renal failure  RENAL/URINARY TRACT ULTRASOUND COMPLETE  Comparison:  Abdominal MRA - 09/02/2012  Findings:  Right Kidney:  Normal cortical thickness, echogenicity and size, measuring 8.7 cm in length.  No focal renal lesions.  No echogenic renal stones.  No urinary obstruction.  Left Kidney:  Normal cortical thickness, echogenicity and size, measuring 8.3 cm in length.  Note is made of an 0.9 x 0.9 x 0.9 cm anechoic lesion within the superior pole of the left kidney which are too small to accurately characterize though favored to represent a renal cyst.  No echogenic renal stones.  No urinary obstruction.  Bladder:  Normal given degree distension.  IMPRESSION: No explanation for patient's worsening renal failure. Specifically, no evidence of urinary obstruction.   Original Report Authenticated By: Jake Seats, MD   Dg Knee Complete 4 Views Right  01/01/2013   CLINICAL DATA:  Fall with right knee pain.  EXAM: RIGHT KNEE - COMPLETE 4+ VIEW  COMPARISON:  None.  FINDINGS: No acute fracture, dislocation or joint effusion is identified. Proliferative changes are seen involving the patella. Chondrocalcinosis is seen involving medial and lateral joint spaces. Extensive vascular calcifications are identified related to diabetes. No bony lesions or destruction are present.  IMPRESSION: No acute fracture. Degenerative changes are present.   Electronically Signed   By: Aletta Edouard M.D.   On: 01/01/2013 15:54    I have reviewed the patient's current medications.  Assessment/Plan: Problem #1 acute kidney  injury superimposed on chronic presently her BUN and creatinine seems to be improving. Patient doesn't have any uremic sinus symptoms. Her ultrasound showed bilaterally small kidneys. Presently patient seems to have underlying chronic renal failure at her baseline creatinine is not known. Problem #2 hyperkalemia her potassium is normal. Problem #3 hypertension her blood  pressure seems to be reasonably controlled Problem #4 history of diabetes  problem #5 anemia most likely iron deficiency anemia. Presently her vital studies are pending. Problem #6 history of coronary artery disease presently she is a symptomatic. Problem #7 metabolic bone disease her calcium and phosphorus is was in range. Problem #8 history of arthritis. Plan: We'll continue with hydration. Check her basic metabolic panel in the morning and follow her iron studies.    LOS: 2 days   Charnita Trudel S 01/03/2013,8:04 AM

## 2013-01-03 NOTE — Clinical Social Work Note (Signed)
CSW referred for transportation resources as it was reported pt had difficulty getting her medications. Pt indicates she lives in an apartment on second floor and has to go up and down stairs which is getting difficult for her. Her nephew is speaking with the complex to see about arranging a first floor apartment. She states [facility] brings her medications to her home. She no longer has a vehicle, so pt is having a harder time getting to MD appointments. CSW provided list of transportation services. Pt aware she can contact [facility] with several days notice in order to be picked up for appointments. No other needs reported. CSW will sign off.  Benay Pike, [place]

## 2013-01-03 NOTE — Evaluation (Signed)
Physical Therapy Evaluation Patient Details Name: [name] MRN: [id number] DOB: 11/23/1930 Today's Date: 01/03/2013 Time: [id number] PT Time Calculation (min): 38 min  PT Assessment / Plan / Recommendation History of Present Illness   Pt is a very active 79 yo female who normally uses a cane to ambulate.  She was on vacation and fell twice in two days.  She is now being referred to therapy to improve her safety in ambulation.  Clinical Impression  Pt demonstrated safety and I with ambulating with a rolling walker.  We will assess her ambulation with a cane and her ability to stair climb tomorrow.    PT Assessment  Patient needs continued PT services    Follow Up Recommendations  Home health PT    Does the patient have the potential to tolerate intense rehabilitation    N/A  Barriers to Discharge   Pt lives on the second floor of an apartment complex    Equipment Recommendations  Rolling walker with 5" wheels    Recommendations for Other Services   none  Frequency Min 5X/week    Precautions / Restrictions Precautions Precautions: Fall Precaution Comments: Pt states that she [place] and fell twice in two days while walking with her cane.  States the first time she blamed in on her shoe the second on the slick floor. Restrictions Weight Bearing Restrictions: No   Pertinent Vitals/Pain 0/10      Mobility  Bed Mobility Bed Mobility: Supine to Sit Supine to Sit: 7: Independent Transfers Transfers: Sit to Stand Sit to Stand: 7: Independent Ambulation/Gait Ambulation/Gait Assistance: 6: Modified independent (Device/Increase time) Ambulation Distance (Feet): 300 Feet Assistive device: Rolling walker Gait Pattern: Within Functional Limits Gait velocity: normal Stairs: No    Exercises General Exercises - Lower Extremity Gluteal Sets: Strengthening;Both;10 reps Long Arc Quad: Strengthening;10 reps;Both Hip ABduction/ADduction:  Strengthening;Both;10 reps   PT Diagnosis: Generalized weakness;Other (comment) (decreased balance)  PT Problem List: Decreased strength;Decreased activity tolerance PT Treatment Interventions: Stair training;Gait training;Therapeutic exercise     PT Goals(Current goals can be found in the care plan section) Acute Rehab PT Goals Patient Stated Goal: Go home and not to fall PT Goal Formulation: With patient Time For Goal Achievement: 01/05/13 Potential to Achieve Goals: Good  Visit Information  Last PT Received On: 01/03/13       Prior [place] expects to be discharged to:: Private residence Living Arrangements: Alone Type of Home: Apartment Home Access: Stairs to enter [employer] of Steps: second level apartment Entrance Stairs-Rails: Right Home Equipment: [place] - single point Prior Function Level of Independence: Independent with assistive device(s) Communication Communication: No difficulties Dominant Hand: Right    Cognition  Cognition Arousal/Alertness: Awake/alert Behavior During Therapy: WFL for tasks assessed/performed Overall Cognitive Status: Within Functional Limits for tasks assessed    Extremity/Trunk Assessment Lower Extremity Assessment Lower Extremity Assessment: LLE deficits/detail LLE Deficits / Details: hip generally 2+/5; knee 3+/5;    Balance Balance Balance Assessed: Yes Static Standing Balance Single Leg Stance - Right Leg: 0 Single Leg Stance - Left Leg: 0  End of Session PT - End of Session Equipment Utilized During Treatment: Gait belt Activity Tolerance: Patient tolerated treatment well Patient left: in chair;with call bell/phone within reach;with family/visitor present Nurse Communication: Mobility status  GP     RUSSELL,CINDY 01/03/2013, 12:36 PM

## 2013-01-03 NOTE — Progress Notes (Signed)
Utilization Review Complete  

## 2013-01-03 NOTE — Care Management Note (Addendum)
    Page 1 of 1   01/05/2013     9:30:31 AM   CARE MANAGEMENT NOTE 01/05/2013  Patient:  [name], [name]   Account Number:  [id number]  Date Initiated:  01/03/2013  Documentation initiated by:  Claretha Cooper  Subjective/Objective Assessment:   Pt states she lives alone. Pt states she worked with PT who indicated [facility] or outpt PT. She is concerned with stairs since she has to climb to 2nd floor. Will follow.     Action/Plan:   Anticipated DC Date:  01/05/2013   Anticipated DC Plan:  [place]  CM consult      Choice offered to / List presented to:             Status of service:  Completed, signed off Medicare Important Message given?  YES (If response is "NO", the following Medicare IM given date fields will be blank) Date Medicare IM given:  01/05/2013 Date Additional Medicare IM given:    Discharge Disposition:  [place]  Per UR Regulation:    If discussed at Long Length of Stay Meetings, dates discussed:    Comments:  01/05/13 Claretha Cooper RN BSN CM 8:50 Pt signed IM and it was explained. Copy left. Pt stated she did want to go to a facility but unsure which one. CSW made aware. At 9:10 nurse tech came to [facility] with daughter's name and telephone number. She was upset that her mother called her, said she had to go home today and justed signed something. CM reviewed IM with daughter. CM gave the telephone to [place], [place] who discussed placement with daughter. Daughter requested pt's RN to call with an update. CM advised her RN, Maretta Bees, that the daughter requested her to call.  01/04/13 Claretha Cooper RN BSN CM Pt considering SNF for [facility].CSW assisting with placement  01/03/13 1500 [place]

## 2013-01-03 NOTE — Progress Notes (Signed)
Inpatient Diabetes Program Recommendations  AACE/ADA: New Consensus Statement on Inpatient Glycemic Control (2013)  Target Ranges:  Prepandial:   less than 140 mg/dL      Peak postprandial:   less than 180 mg/dL (1-2 hours)      Critically ill patients:  140 - 180 mg/dL   Results for GRISSEL, PARSHALL (MRN ID:[id number]) as of 01/03/2013 09:26  Ref. Range 01/01/2013 23:05 01/02/2013 07:45 01/02/2013 11:45 01/02/2013 16:36 01/02/2013 17:16 01/02/2013 21:00 01/03/2013 07:49  Glucose-Capillary Latest Range: 70-99 mg/dL 177 (H) 114 (H) 170 (H) 59 (L) 111 (H) 147 (H) 124 (H)    Inpatient Diabetes Program Recommendations Oral Agents: Please consider discontinuing glipizide while inpatient.   Note: Patient experienced hypoglycemia yesterday with blood glucose of 59 mg/dl at 16:36. Please discontinue Glipizide while inpatient and continue to manage inpatient glycemic control with Novolog sensitive correction scale.  Will continue to follow.  Thanks, Barnie Alderman, RN, MSN, CCRN Diabetes Coordinator Inpatient Diabetes Program [phone] (Team Pager) [phone] ([facility] office) [phone] [place] [facility] office)

## 2013-01-03 NOTE — Progress Notes (Signed)
INITIAL NUTRITION ASSESSMENT  DOCUMENTATION CODES Per approved criteria  -Not Applicable   INTERVENTION: Recommend check Methylmalonic Acid and Homocysteine  NUTRITION DIAGNOSIS: None at this time   Goal: Pt to meet >/= 90% of their estimated nutrition needs   Monitor:  Po intake, labs and wt trends  Reason for Assessment: Malnutrition Screen Score=2  79 y.o. female   ASSESSMENT:  Pt lives alone. S/p fall during vacation and c/o right knee pain. Modest wt loss of 10#, 6% over past 18 months.Lives alone. Good appetite and po intake 75-100% meals.  She does not meet criteria for malnutrition at this time. BMP and Iron pending.   Vitamin B12 Results for JERIS, PALADINO (MRN ID:[id number]) as of 01/03/2013 12:01  Ref. Range 01/21/2007 11:20 01/01/2013 16:56  Vitamin B-12 Latest Range: 211-911 pg/mL 324 364   IVF's NS @135  ml/hr.  Patient Active Problem List   Diagnosis Date Noted  . Acute on chronic renal failure 01/01/2013  . Anemia in chronic renal disease 01/01/2013  . Osteoarthritis 01/01/2013  . Right knee pain 01/01/2013  . Diabetes mellitus, type 2 01/01/2013  . HTN (hypertension) 01/01/2013  . Other and unspecified hyperlipidemia 01/01/2013  . Hx of CABG 01/01/2013  . Anemia, iron deficiency 08/18/2012  . Anemia of chronic disease 10/05/2010  . CHRONIC KIDNEY DISEASE UNSPECIFIED 02/18/2010    Height: Ht Readings from Last 1 Encounters:  01/01/13 5\' 6"  (1.676 m)    Weight: Wt Readings from Last 1 Encounters:  01/02/13 157 lb (71.215 kg)    Ideal Body Weight: 130# (59 kg)  % Ideal Body Weight: 121%  Wt Readings from Last 10 Encounters:  01/02/13 157 lb (71.215 kg)  06/13/11 166 lb 8 oz (75.524 kg)  12/10/10 167 lb 6.4 oz (75.932 kg)    Usual Body Weight: 166-167#  % Usual Body Weight: 94%  BMI:  Body mass index is 25.35 kg/(m^2).overweight  Estimated Nutritional Needs: Kcal: 1475-1770 Protein: 70-80 gr Fluid: 1200 ml per MD  Skin: No issues  noted  Diet Order: Renal 80/90-2-05-02-1198 ml  (po- 50%)  EDUCATION NEEDS: -Education not appropriate at this time   Intake/Output Summary (Last 24 hours) at 01/03/13 1125 Last data filed at 01/03/13 0700  Gross per 24 hour  Intake   3110 ml  Output   1000 ml  Net   2110 ml    Last BM: 01/02/13  Labs:   Recent Labs Lab 01/01/13 1656 01/02/13 0641 01/03/13 0631  NA 133* 140 144  K 5.7* 6.2* 4.6  CL 102 108 115*  CO2 20 21 21   BUN 106* 99* 68*  CREATININE 5.91* 5.06* 3.41*  CALCIUM 8.8 9.1 8.6  MG 2.2  --   --   PHOS 6.1*  --  4.4  GLUCOSE 134* 123* 113*    CBG (last 3)   Recent Labs  01/02/13 1716 01/02/13 2100 01/03/13 0749  GLUCAP 111* 147* 124*    Scheduled Meds: . amLODipine  10 mg Oral q morning - 10a  . aspirin EC  81 mg Oral Daily  . atorvastatin  10 mg Oral q1800  . cilostazol  100 mg Oral BID  . glipiZIDE  5 mg Oral Q breakfast  . heparin  5,000 Units Subcutaneous Q8H  . insulin aspart  0-5 Units Subcutaneous QHS  . insulin aspart  0-9 Units Subcutaneous TID WC    Continuous Infusions: . sodium chloride 135 mL/hr at 01/03/13 [id number]    Past Medical History  Diagnosis Date  .  Anemia of chronic disease 10/05/2010  . Diabetes mellitus   . Hypertension   . High cholesterol   . Neuropathy   . Anemia, iron deficiency 08/18/2012  . Arthritis   . Chronic kidney disease   . Shortness of breath     Past Surgical History  Procedure Laterality Date  . Heart bypass surgery    . Left eye surgery    . Eye surgery    . Coronary artery bypass graft      Colman Cater MS,RD,LDN,CSG Office: [phone] Pager: [phone]

## 2013-01-03 NOTE — Progress Notes (Signed)
Subjective: Patient feels better. She is receiving Iv fluid and being followed by nephrology.  Objective: Vital signs in last 24 hours: Temp:  [98.2 F (36.8 C)-98.4 F (36.9 C)] 98.2 F (36.8 C) (10/06 0658) Pulse Rate:  [73-82] 76 (10/06 0658) Resp:  [17-19] 19 (10/06 0658) BP: (127-165)/(43-63) 134/62 mmHg (10/06 0658) SpO2:  [98 %-100 %] 100 % (10/06 0658) Weight change:  Last BM Date: 01/02/13  Intake/Output from previous day: 10/05 0701 - 10/06 0700 In: 3110 [P.O.:440; I.V.:2670] Out: 1200 [Urine:1200]  PHYSICAL EXAM General appearance: alert and slowed mentation Resp: clear to auscultation bilaterally Cardio: S1, S2 normal GI: soft, non-tender; bowel sounds normal; no masses,  no organomegaly Extremities: tenderness of the knees  Lab Results:    @labtest @ [facility] No results found for this basename: PHART, PCO2, PO2ART, TCO2, HCO3,  in the last 72 hours CULTURES Recent Results (from the past 240 hour(s))  URINE CULTURE     Status: None   Collection Time    01/01/13  3:00 PM      Result Value Range Status   Specimen Description URINE, CLEAN CATCH   Final   Special Requests NONE   Final   Culture  Setup Time     Final   Value: 01/01/2013 22:03     Performed at [place]     Final   Value: 45,000 COLONIES/ML     Performed at [facility]   Culture     Final   Value: [place]     Performed at [facility]   Report Status PENDING   Incomplete   Studies/Results: Dg Chest 2 View  01/01/2013   CLINICAL DATA:  Shortness of breath and fall.  EXAM: CHEST - 2 VIEW  COMPARISON:  07/18/2011  FINDINGS: Stable mild cardiac enlargement status post prior CABG. There is no evidence of pulmonary edema, consolidation, pneumothorax, nodule or pleural fluid. The bony thorax shows no significant abnormalities.  IMPRESSION: No active disease.  Stable mild cardiomegaly.   Electronically Signed   By: Aletta Edouard M.D.   On:  01/01/2013 16:30   US Renal  01/02/2013   *RADIOLOGY REPORT*  Clinical Data: Worsening renal failure  RENAL/URINARY TRACT ULTRASOUND COMPLETE  Comparison:  Abdominal MRA - 09/02/2012  Findings:  Right Kidney:  Normal cortical thickness, echogenicity and size, measuring 8.7 cm in length.  No focal renal lesions.  No echogenic renal stones.  No urinary obstruction.  Left Kidney:  Normal cortical thickness, echogenicity and size, measuring 8.3 cm in length.  Note is made of an 0.9 x 0.9 x 0.9 cm anechoic lesion within the superior pole of the left kidney which are too small to accurately characterize though favored to represent a renal cyst.  No echogenic renal stones.  No urinary obstruction.  Bladder:  Normal given degree distension.  IMPRESSION: No explanation for patient's worsening renal failure. Specifically, no evidence of urinary obstruction.   Original Report Authenticated By: Jake Seats, MD   Dg Knee Complete 4 Views Right  01/01/2013   CLINICAL DATA:  Fall with right knee pain.  EXAM: RIGHT KNEE - COMPLETE 4+ VIEW  COMPARISON:  None.  FINDINGS: No acute fracture, dislocation or joint effusion is identified. Proliferative changes are seen involving the patella. Chondrocalcinosis is seen involving medial and lateral joint spaces. Extensive vascular calcifications are identified related to diabetes. No bony lesions or destruction are present.  IMPRESSION: No acute fracture. Degenerative changes are  present.   Electronically Signed   By: Aletta Edouard M.D.   On: 01/01/2013 15:54    Medications: I have reviewed the patient's current medications.  Assesment: Active Problems:   Acute on chronic renal failure   Anemia in chronic renal disease   Osteoarthritis   Right knee pain   Diabetes mellitus, type 2   HTN (hypertension)   Other and unspecified hyperlipidemia   Hx of CABG    Plan: Medications reviewed Continue IV fluid Physical therapy Will monitor BMp    LOS: 2 days    [place] 01/03/2013, 7:55 AM

## 2013-01-03 NOTE — Clinical Documentation Improvement (Signed)
THIS DOCUMENT IS NOT A PERMANENT PART OF THE MEDICAL RECORD  Please update your documentation with the medical record to reflect your response to this query. If you need help knowing how to do this please call [phone].  01/03/13   Dear Dr. Fanta[facility],  A review of the patient medical record has revealed the following indicators.  Based on your clinical judgment, please clarify and document in a progress note and/or discharge summary the clinical condition associated with the following supporting information:  Admitted with acute and chronic renal failure Labs: BUN/CR/GFR: 10/4 = 106/5.91/6 10/5 = 99/5.06/7 10/6 = 68/3.41/12  Treatments Monitoring BMP I&O monitoring Daily weights    Possible Clinical Conditions?   CKD Stage I -  GFR > OR = 90 CKD Stage II - GFR 60-80 CKD Stage III - GFR 30-59 CKD Stage IV - GFR 15-29 CKD Stage V - GFR < 15 ESRD (End Stage Renal Disease) Other condition_____________ Cannot Clinically determine   You may use possible, probable, or suspect with inpatient documentation. possible, probable, suspected diagnoses MUST be documented at the time of discharge  Reviewed: Dr. Theador Hawthorne added CKD 3/4 to his note 10/8 0434  Thank You,  Estella Husk RN Clinical Documentation Specialist: [phone] [place]

## 2013-01-04 LAB — BASIC METABOLIC PANEL
BUN: 43 mg/dL — ABNORMAL HIGH (ref 6–23)
Creatinine, Ser: 2.51 mg/dL — ABNORMAL HIGH (ref 0.50–1.10)
GFR calc Af Amer: 20 mL/min — ABNORMAL LOW (ref >90)
GFR calc non Af Amer: 17 mL/min — ABNORMAL LOW (ref >90)
Glucose, Bld: 111 mg/dL — ABNORMAL HIGH (ref 70–99)

## 2013-01-04 LAB — GLUCOSE, CAPILLARY: Glucose-Capillary: 141 mg/dL — ABNORMAL HIGH (ref 70–99)

## 2013-01-04 MED ORDER — VITAMIN D (ERGOCALCIFEROL) 1.25 MG (50000 UNIT) PO CAPS
50000.0000 [IU] | ORAL_CAPSULE | ORAL | Status: DC
  Administered 2013-01-04: 50000 [IU] via ORAL
  Filled 2013-01-04: qty 1

## 2013-01-04 MED ORDER — DARBEPOETIN ALFA-POLYSORBATE 40 MCG/0.4ML IJ SOLN
20.0000 ug | INTRAMUSCULAR | Status: DC
  Administered 2013-01-04: 20 ug via SUBCUTANEOUS
  Filled 2013-01-04: qty 0.4

## 2013-01-04 MED ORDER — CALCITRIOL 0.25 MCG PO CAPS
0.5000 ug | ORAL_CAPSULE | Freq: Every day | ORAL | Status: DC
  Administered 2013-01-04 – 2013-01-05 (×2): 0.5 ug via ORAL
  Filled 2013-01-04 (×2): qty 2

## 2013-01-04 NOTE — Clinical Social Work Placement (Signed)
Clinical Social Work Department CLINICAL SOCIAL WORK PLACEMENT NOTE 01/04/2013  Patient:  [name], [name]  Account Number:  [id number] Admit date:  01/01/2013  Clinical Social Worker:  Benay Pike, LCSW  Date/time:  01/04/2013 01:55 PM  Clinical Social Work is seeking post-discharge placement for this patient at the following level of care:   [place]   (*CSW will update this form in Epic as items are completed)   01/04/2013  Patient/family provided with [facility] Department of Clinical Social Work's list of facilities offering this level of care within the geographic area requested by the patient (or if unable, by the patient's family).  01/04/2013  Patient/family informed of their freedom to choose among providers that offer the needed level of care, that participate in Medicare, Medicaid or managed care program needed by the patient, have an available bed and are willing to accept the patient.  01/04/2013  Patient/family informed of [facility]' ownership interest in [facility], as well as of the fact that they are under no obligation to receive care at this facility.  PASARR submitted to EDS on 01/04/2013 PASARR number received from EDS on 01/04/2013  FL2 transmitted to all facilities in geographic area requested by pt/family on  01/04/2013 FL2 transmitted to all facilities within larger geographic area on   Patient informed that his/her managed care company has contracts with or will negotiate with  certain facilities, including the following:     Patient/family informed of bed offers received:   Patient chooses bed at  Physician recommends and patient chooses bed at    Patient to be transferred to  on   Patient to be transferred to facility by   The following physician request were entered in Epic:   Additional Comments:  Benay Pike, [place]

## 2013-01-04 NOTE — Clinical Social Work Psychosocial (Signed)
Clinical Social Work Department BRIEF PSYCHOSOCIAL ASSESSMENT 01/04/2013  Patient:  [name], [name]     Account Number:  [id number]     Admit date:  01/01/2013  Clinical Social Worker:  Wyatt Haste  Date/Time:  01/04/2013 01:55 PM  Referred by:  CSW  Date Referred:  01/04/2013 Referred for  SNF Placement   Other Referral:   Interview type:  Patient Other interview type:    PSYCHOSOCIAL DATA Living Status:  ALONE Admitted from facility:   Level of care:   Primary support name:  Nephew Primary support relationship to patient:  FAMILY Degree of support available:   adequate per pt    CURRENT CONCERNS Current Concerns  Post-Acute Placement   Other Concerns:    SOCIAL WORK ASSESSMENT / PLAN CSW met with pt again to complete assessment as PT is now recommending SNF. Pt lives alone in a second story apartment. She reports she has difficulty on the steps some days and limits her leaving the apartment in order to avoid them. Her nephew will be back in town today and plans to speak with complex regarding moving to first floor. Pt was planning on going home, but did not do as well today with therapy due to weakness in knees. Pt is open to considering SNF. CSW discussed placement process, including copays. She is agreeable to SNF in [place] only at this point. SNF list provided.   Assessment/plan status:  Psychosocial Support/Ongoing Assessment of Needs Other assessment/ plan:   Information/referral to community resources:   SNF list    PATIENT'S/FAMILY'S RESPONSE TO PLAN OF CARE: Pt reports positive feelings regarding [facility] for rehab prior to returning home. CSW will initiate bed search and return with offers when available.       Benay Pike, [place]

## 2013-01-04 NOTE — Progress Notes (Signed)
Subjective: Interval History: has no complaint of nausea or vomiting. Patient presently denies any difficulty in breathing. She complains of feeling weak but no dizyness ir light headedness.  Objective: Vital signs in last 24 hours: Temp:  [97.7 F (36.5 C)-98.5 F (36.9 C)] 98.1 F (36.7 C) (10/07 0547) Pulse Rate:  [71-75] 71 (10/07 0547) Resp:  [18] 18 (10/07 0547) BP: (140-176)/(46-74) 176/74 mmHg (10/07 0552) SpO2:  [100 %] 100 % (10/07 0547) Weight:  [73.936 kg (163 lb)] 73.936 kg (163 lb) (10/07 0552) Weight change:   Intake/Output from previous day: 10/06 0701 - 10/07 0700 In: 3687 [P.O.:600; I.V.:3087] Out: 1575 [Urine:1575] Intake/Output this shift:    General appearance: alert, cooperative and no distress Resp: clear to auscultation bilaterally Cardio: regular rate and rhythm, S1, S2 normal, no murmur, click, rub or gallop GI: soft, non-tender; bowel sounds normal; no masses,  no organomegaly Extremities: extremities normal, atraumatic, no cyanosis or edema  Lab Results:  Recent Labs  01/02/13 0641 01/03/13 0631  WBC 4.7 4.8  HGB 8.0* 8.0*  HCT 24.5* 23.8*  PLT 180 156   BMET:   Recent Labs  01/03/13 0631 01/04/13 0630  NA 144 144  K 4.6 4.4  CL 115* 116*  CO2 21 21  GLUCOSE 113* 111*  BUN 68* 43*  CREATININE 3.41* 2.51*  CALCIUM 8.6 9.0    Recent Labs  01/02/13 1036  PTH 245.0*   Iron Studies:   Recent Labs  01/02/13 1959  IRON 112  TIBC 265  FERRITIN 474*    Studies/Results: US Renal  01/02/2013   *RADIOLOGY REPORT*  Clinical Data: Worsening renal failure  RENAL/URINARY TRACT ULTRASOUND COMPLETE  Comparison:  Abdominal MRA - 09/02/2012  Findings:  Right Kidney:  Normal cortical thickness, echogenicity and size, measuring 8.7 cm in length.  No focal renal lesions.  No echogenic renal stones.  No urinary obstruction.  Left Kidney:  Normal cortical thickness, echogenicity and size, measuring 8.3 cm in length.  Note is made of an 0.9 x  0.9 x 0.9 cm anechoic lesion within the superior pole of the left kidney which are too small to accurately characterize though favored to represent a renal cyst.  No echogenic renal stones.  No urinary obstruction.  Bladder:  Normal given degree distension.  IMPRESSION: No explanation for patient's worsening renal failure. Specifically, no evidence of urinary obstruction.   Original Report Authenticated By: Jake Seats, MD    I have reviewed the patient's current medications.  Assessment/Plan: Problem #1 acute kidney injury superimposed on chronic presently her BUN and creatinine is progresivly imkproving. Patient doesn't have any uremic sinus symptoms.  Problem #2 hyperkalemia her potassium is normal. Problem #3 hypertension her blood pressure seems to be reasonably controlled Problem #4 history of diabetes  problem #5 anemia most likely secondary to chronic diseases.his iron saturation and ferratin is normal Problem #6 history of coronary artery disease presently she is a symptomatic. Problem #7 metabolic bone disease her calcium and phosphorus is was in range; but patient with elvatted PTH and vitamin D deficiency Problem #8 history of arthritis. Plan: We'll continue with hydration.           We will start patient on Rocaltrol 0.5 microgram po once a day           Epogen 4000 [place] sq every 2 weeks           Ergocalciferol 50,000 IU po once a week  Basic metabolic panel in am    LOS: 3 days   Bea Duren S 01/04/2013,8:06 AM

## 2013-01-04 NOTE — Progress Notes (Signed)
Physical Therapy Treatment Patient Details Name: [name] MRN: [id number] DOB: 1930/09/17 Today's Date: 01/04/2013 Time: [id number] PT Time Calculation (min): 46 min  PT Assessment / Plan / Recommendation  History of Present Illness     PT Comments   Pt is displaying increased weakness in both knees this AM.  Her left knee is edemetous...quadriceps strength is 3+/5.  Pt is unable to climb steps at this point.  She lives on the 2nd floor of an apartment building (15 steps).  There is no elevator available.  I am quite concerned about her ability to manage at home at the time of d/c.  She has no assistance and her functional ability is lower than seen during evaluation yesterday.  It would be my recommendation that pt go to SNF at d/c to increase functional independence, strength and mobility to maximize ability to return home.  Follow Up Recommendations  SNF     Does the patient have the potential to tolerate intense rehabilitation     Barriers to Discharge        Equipment Recommendations       Recommendations for Other Services    Frequency     Progress towards PT Goals Progress towards PT goals: Not progressing toward goals - comment (increased weakness/instability)  Plan Discharge plan needs to be updated    Precautions / Restrictions     Pertinent Vitals/Pain     Mobility  Transfers Sit to Stand: 4: Min assist;With upper extremity assist;From bed Details for Transfer Assistance: pt needed assistance to shift weight anteriorly in order to stand...knee weakness inhibited stance Ambulation/Gait Ambulation/Gait Assistance: 4: Min assist Ambulation Distance (Feet): 110 Feet Assistive device: Rolling walker Gait Pattern: Decreased stance time - left;Decreased hip/knee flexion - left;Decreased hip/knee flexion - right;Trunk flexed Gait velocity: more labored gait today General Gait Details: an attempt was made to have pt ambulate with a cane, but she was quite unstable  due to general knee weakness (left worse than right)..we continued to do gait training with a walker, cues for increased thoracic extension Stairs: No (pt unable due to fatigue, knee weakness) Wheelchair Mobility Wheelchair Mobility: No    Exercises General Exercises - Lower Extremity Quad Sets: AROM;Both;10 reps;Seated Long Arc Quad: AROM;Both;10 reps;Seated   PT Diagnosis:    PT Problem List:   PT Treatment Interventions:     PT Goals (current goals can now be found in the care plan section)    Visit Information  Last PT Received On: 01/04/13    Subjective Data      Cognition  Cognition Arousal/Alertness: Awake/alert Behavior During Therapy: [facility] for tasks assessed/performed Overall Cognitive Status: Within Functional Limits for tasks assessed    Balance     End of Session PT - End of Session Equipment Utilized During Treatment: Gait belt Activity Tolerance: Patient limited by fatigue Patient left: in chair;with call bell/phone within reach   GP     Demetrios Isaacs L 01/04/2013, 12:00 PM

## 2013-01-04 NOTE — Progress Notes (Signed)
Subjective: Patient is improving. Her renal function gradually improving but remained very weak.  Objective: Vital signs in last 24 hours: Temp:  [97.7 F (36.5 C)-98.5 F (36.9 C)] 98.1 F (36.7 C) (10/07 0547) Pulse Rate:  [71-75] 71 (10/07 0547) Resp:  [18] 18 (10/07 0547) BP: (140-176)/(46-74) 176/74 mmHg (10/07 0552) SpO2:  [100 %] 100 % (10/07 0547) Weight:  [73.936 kg (163 lb)] 73.936 kg (163 lb) (10/07 0552) Weight change:  Last BM Date: 01/03/13  Intake/Output from previous day: 10/06 0701 - 10/07 0700 In: 3687 [P.O.:600; I.V.:3087] Out: 1575 [Urine:1575]  PHYSICAL EXAM General appearance: alert and slowed mentation Resp: clear to auscultation bilaterally Cardio: S1, S2 normal GI: soft, non-tender; bowel sounds normal; no masses,  no organomegaly Extremities: tenderness of the knees  Lab Results:    @labtest @ [facility] No results found for this basename: PHART, PCO2, PO2ART, TCO2, HCO3,  in the last 72 hours CULTURES Recent Results (from the past 240 hour(s))  URINE CULTURE     Status: None   Collection Time    01/01/13  3:00 PM      Result Value Range Status   Specimen Description URINE, CLEAN CATCH   Final   Special Requests NONE   Final   Culture  Setup Time     Final   Value: 01/01/2013 22:03     Performed at [place]     Final   Value: 45,000 COLONIES/ML     Performed at [facility]   Culture     Final   Value: CITROBACTER FREUNDII     Performed at [facility]   Report Status 01/03/2013 FINAL   Final   Organism ID, Bacteria CITROBACTER FREUNDII   Final   Studies/Results: US Renal  01/02/2013   *RADIOLOGY REPORT*  Clinical Data: Worsening renal failure  RENAL/URINARY TRACT ULTRASOUND COMPLETE  Comparison:  Abdominal MRA - 09/02/2012  Findings:  Right Kidney:  Normal cortical thickness, echogenicity and size, measuring 8.7 cm in length.  No focal renal lesions.  No echogenic renal stones.  No urinary  obstruction.  Left Kidney:  Normal cortical thickness, echogenicity and size, measuring 8.3 cm in length.  Note is made of an 0.9 x 0.9 x 0.9 cm anechoic lesion within the superior pole of the left kidney which are too small to accurately characterize though favored to represent a renal cyst.  No echogenic renal stones.  No urinary obstruction.  Bladder:  Normal given degree distension.  IMPRESSION: No explanation for patient's worsening renal failure. Specifically, no evidence of urinary obstruction.   Original Report Authenticated By: Jake Seats, MD    Medications: I have reviewed the patient's current medications.  Assesment: Active Problems:   Acute on chronic renal failure   Anemia in chronic renal disease   Osteoarthritis   Right knee pain   Diabetes mellitus, type 2   HTN (hypertension)   Other and unspecified hyperlipidemia   Hx of CABG    Plan: Medications reviewed Continue IV fluid Physical therapy Discussed with nephrologist and recommend to continue to rehydrate.    LOS: 3 days   Talha Iser 01/04/2013, 8:35 AM

## 2013-01-05 LAB — BASIC METABOLIC PANEL
BUN: 27 mg/dL — ABNORMAL HIGH (ref 6–23)
CO2: 19 mEq/L (ref 19–32)
Chloride: 114 mEq/L — ABNORMAL HIGH (ref 96–112)
Creatinine, Ser: 2.08 mg/dL — ABNORMAL HIGH (ref 0.50–1.10)
Glucose, Bld: 110 mg/dL — ABNORMAL HIGH (ref 70–99)
Potassium: 4.2 mEq/L (ref 3.5–5.1)

## 2013-01-05 LAB — GLUCOSE, CAPILLARY
Glucose-Capillary: 94 mg/dL (ref 70–99)
Glucose-Capillary: 148 mg/dL — ABNORMAL HIGH (ref 70–99)

## 2013-01-05 MED ORDER — HYDROCODONE-ACETAMINOPHEN 5-325 MG PO TABS: 1.0000 | ORAL_TABLET | Freq: Two times a day (BID) | ORAL | Status: DC

## 2013-01-05 MED ORDER — SODIUM BICARBONATE 650 MG PO TABS
650.0000 mg | ORAL_TABLET | Freq: Three times a day (TID) | ORAL | Status: DC
  Administered 2013-01-05: 650 mg via ORAL
  Filled 2013-01-05: qty 1

## 2013-01-05 MED ORDER — VITAMIN D (ERGOCALCIFEROL) 1.25 MG (50000 UNIT) PO CAPS: 50000.0000 [IU] | ORAL_CAPSULE | ORAL | Status: DC

## 2013-01-05 NOTE — Progress Notes (Signed)
[name]  MRN: ID:[id number]  DOB/AGE: 08-15-1930 79 y.o.  Primary Care Physician:FANTA,TESFAYE, MD  Admit date: 01/01/2013  Chief Complaint:  Chief Complaint  Patient presents with  . Knee Pain  . Fall    S-Pt presented on  01/01/2013 with  Chief Complaint  Patient presents with  . Knee Pain  . Fall  .    Pt today feels better  Meds . amLODipine  10 mg Oral q morning - 10a  . aspirin EC  81 mg Oral Daily  . atorvastatin  10 mg Oral q1800  . calcitRIOL  0.5 mcg Oral Daily  . cilostazol  100 mg Oral BID  . darbepoetin  20 mcg Subcutaneous Q7 days  . glipiZIDE  5 mg Oral Q breakfast  . heparin  5,000 Units Subcutaneous Q8H  . insulin aspart  0-5 Units Subcutaneous QHS  . insulin aspart  0-9 Units Subcutaneous TID WC  . Vitamin D (Ergocalciferol)  50,000 Units Oral Q7 days          Physical Exam: Vital signs in last 24 hours: Temp:  [98.2 F (36.8 C)-98.6 F (37 C)] 98.4 F (36.9 C) (10/08 0450) Pulse Rate:  [67-102] 102 (10/08 0500) Resp:  [18-24] 18 (10/08 0500) BP: (124-198)/(58-73) 164/71 mmHg (10/08 0926) SpO2:  [97 %-100 %] 100 % (10/08 0450) Weight:  [163 lb 2.3 oz (74 kg)] 163 lb 2.3 oz (74 kg) (10/08 0500) Weight change: 2.3 oz (0.064 kg) Last BM Date: 01/04/13  Intake/Output from previous day: 10/07 0701 - 10/08 0700 In: 2676.8 [P.O.:960; I.V.:1716.8] Out: 1650 [Urine:1650]     Physical Exam: General- pt is awake,alert, oriented to time place and person Resp- No acute REsp distress, CTA B/L NO Rhonchi CVS- S1S2 regular in rate and rhythm GIT- BS+, soft, NT, ND EXT- NO LE Edema, Cyanosis   Lab Results: CBC  Recent Labs  01/03/13 0631  WBC 4.8  HGB 8.0*  HCT 23.8*  PLT 156    BMET  Recent Labs  01/04/13 0630 01/05/13 0551  NA 144 141  K 4.4 4.2  CL 116* 114*  CO2 21 19  GLUCOSE 111* 110*  BUN 43* 27*  CREATININE 2.51* 2.08*  CALCIUM 9.0 8.7   Trend Creat 2014 5.91==>3.341==>2.51==>2.08 2011 1.5 2008  1.35     MICRO Recent Results (from the past 240 hour(s))  URINE CULTURE     Status: None   Collection Time    01/01/13  3:00 PM      Result Value Range Status   Specimen Description URINE, CLEAN CATCH   Final   Special Requests NONE   Final   Culture  Setup Time     Final   Value: 01/01/2013 22:03     Performed at [place]     Final   Value: 45,000 COLONIES/ML     Performed at [facility]   Culture     Final   Value: CITROBACTER FREUNDII     Performed at [facility]   Report Status 01/03/2013 FINAL   Final   Organism ID, Bacteria CITROBACTER FREUNDII   Final      Lab Results  Component Value Date   PTH 245.0* 01/02/2013   CALCIUM 8.7 01/05/2013   PHOS 4.4 01/03/2013       Impression: 1)Renal  AKI secondary to Prerenal/ ATN                AKI on CKD  AKI now improving               CKD stage 3/4.               CKD since 2008               CKD secondary to DM/ HTN/ Age asso Decline                Progression of CKD marked with AKI                 2)HTN Target Organ damage  CKD  Medication- On Calcium Channel Blockers    3)Anemia HGb  NOT at goal (9--11) On ESA  4)CKD Mineral-Bone Disorder PTH elevated. Secondary Hyperparathyroidism present. Phosphorus at goal.   5)DM - being followed By Primary team   6)Electrolytes Normokalemic NOrmonatremic   7)Acid base Co2 not  at goal     Plan:  Will reduce IVF Will start PO BIcarb Will follow Bmet       Gordana Kewley S 01/05/2013, 10:18 AM

## 2013-01-05 NOTE — Discharge Summary (Signed)
Physician Discharge Summary  Patient ID: [name] MRN: [id number] DOB/AGE: 77/15/1932 77 y.o. Primary Care Physician:Christalynn Boise, MD Admit date: 01/01/2013 Discharge date: 01/05/2013    Discharge Diagnoses:  Active Problems:   Acute on chronic renal failure   Anemia in chronic renal disease   Osteoarthritis   Right knee pain   Diabetes mellitus, type 2   HTN (hypertension)   Other and unspecified hyperlipidemia   Hx of CABG     Medication List    STOP taking these medications       ezetimibe 10 MG tablet  Commonly known as:  ZETIA     torsemide 10 MG tablet  Commonly known as:  DEMADEX      TAKE these medications       amLODipine 10 MG tablet  Commonly known as:  NORVASC  Take 10 mg by mouth every morning. For BLOOD PRESSURE     cilostazol 100 MG tablet  Commonly known as:  PLETAL  Take 100 mg by mouth 2 (two) times daily. For CIRCULATION or AS DIRECTED by your physician     DAILY VITE Tabs  Take 1 tablet by mouth daily.     donepezil 10 MG tablet  Commonly known as:  ARICEPT  Take 10 mg by mouth at bedtime. For MEMORY     enalapril 20 MG tablet  Commonly known as:  VASOTEC  Take 20 mg by mouth 2 (two) times daily. For BLOOD PRESSURE     ferrous sulfate 325 (65 FE) MG tablet  Take 325 mg by mouth 2 (two) times daily. As SUPPLEMENT     gabapentin 300 MG capsule  Commonly known as:  NEURONTIN  Take 300 mg by mouth at bedtime. For NEUROPATHY/NERVE PAIN     glipiZIDE 5 MG 24 hr tablet  Commonly known as:  GLUCOTROL XL  Take 5 mg by mouth daily. For BLOOD SUGARS/DIABETES     glucose 4 GM chewable tablet  Chew 16 g by mouth as needed for low blood sugar.     HYDROcodone-acetaminophen 5-325 MG per tablet  Commonly known as:  NORCO/VICODIN  Take 1 tablet by mouth 2 (two) times daily. For PAIN     linagliptin 5 MG Tabs tablet  Commonly known as:  TRADJENTA  Take 5 mg by mouth every morning. For BLOOD SUGARS/DIABETES     rosuvastatin 5 MG  tablet  Commonly known as:  CRESTOR  Take 5 mg by mouth every morning. For CHOLESTEROL     Vitamin D (Ergocalciferol) 50000 UNITS Caps capsule  Commonly known as:  DRISDOL  Take 1 capsule (50,000 Units total) by mouth every 7 (seven) days.        Discharged Condition: improved    Consults: Nephrology  Significant Diagnostic Studies: Dg Chest 2 View  01/01/2013   CLINICAL DATA:  Shortness of breath and fall.  EXAM: CHEST - 2 VIEW  COMPARISON:  07/18/2011  FINDINGS: Stable mild cardiac enlargement status post prior CABG. There is no evidence of pulmonary edema, consolidation, pneumothorax, nodule or pleural fluid. The bony thorax shows no significant abnormalities.  IMPRESSION: No active disease.  Stable mild cardiomegaly.   Electronically Signed   By: Aletta Edouard M.D.   On: 01/01/2013 16:30   US Renal  01/02/2013   *RADIOLOGY REPORT*  Clinical Data: Worsening renal failure  RENAL/URINARY TRACT ULTRASOUND COMPLETE  Comparison:  Abdominal MRA - 09/02/2012  Findings:  Right Kidney:  Normal cortical thickness, echogenicity and size, measuring 8.7 cm in length.  No focal renal lesions.  No echogenic renal stones.  No urinary obstruction.  Left Kidney:  Normal cortical thickness, echogenicity and size, measuring 8.3 cm in length.  Note is made of an 0.9 x 0.9 x 0.9 cm anechoic lesion within the superior pole of the left kidney which are too small to accurately characterize though favored to represent a renal cyst.  No echogenic renal stones.  No urinary obstruction.  Bladder:  Normal given degree distension.  IMPRESSION: No explanation for patient's worsening renal failure. Specifically, no evidence of urinary obstruction.   Original Report Authenticated By: Jake Seats, MD   Dg Knee Complete 4 Views Right  01/01/2013   CLINICAL DATA:  Fall with right knee pain.  EXAM: RIGHT KNEE - COMPLETE 4+ VIEW  COMPARISON:  None.  FINDINGS: No acute fracture, dislocation or joint effusion is identified.  Proliferative changes are seen involving the patella. Chondrocalcinosis is seen involving medial and lateral joint spaces. Extensive vascular calcifications are identified related to diabetes. No bony lesions or destruction are present.  IMPRESSION: No acute fracture. Degenerative changes are present.   Electronically Signed   By: Aletta Edouard M.D.   On: 01/01/2013 15:54    Lab Results: Basic Metabolic Panel:  Recent Labs  01/03/13 0631 01/04/13 0630 01/05/13 0551  NA 144 144 141  K 4.6 4.4 4.2  CL 115* 116* 114*  CO2 21 21 19   GLUCOSE 113* 111* 110*  BUN 68* 43* 27*  CREATININE 3.41* 2.51* 2.08*  CALCIUM 8.6 9.0 8.7  PHOS 4.4  --   --    Liver Function Tests: No results found for this basename: AST, ALT, ALKPHOS, BILITOT, PROT, ALBUMIN,  in the last 72 hours   CBC:  Recent Labs  01/03/13 0631  WBC 4.8  HGB 8.0*  HCT 23.8*  MCV 87.8  PLT 156    Recent Results (from the past 240 hour(s))  URINE CULTURE     Status: None   Collection Time    01/01/13  3:00 PM      Result Value Range Status   Specimen Description URINE, CLEAN CATCH   Final   Special Requests NONE   Final   Culture  Setup Time     Final   Value: 01/01/2013 22:03     Performed at [facility]     Final   Value: 45,000 COLONIES/ML     Performed at [facility]   Culture     Final   Value: CITROBACTER FREUNDII     Performed at [facility]   Report Status 01/03/2013 FINAL   Final   Organism ID, [place]   Final     Hospital Course:  This is a 79 years old female patient with history of multiple medical illnesses who was admitted due to accidental fall and generalized weakness. She was found to have acut on chronic renal failure. She was hydrated improved and discharged.  Discharge Exam: Blood pressure 198/73, pulse 102, temperature 98.4 F (36.9 C), temperature source Oral, resp. rate 18, height 5\' 6"  (1.676 m), weight 74 kg (163 lb  2.3 oz), SpO2 100.00%.   Disposition:  home        Follow-up Information   Follow up with [facility], MD In 1 week.   Specialty:  Internal Medicine   Contact information:   [place] [address] [phone]       Signed: Rosita Fire  01/05/2013,  7:25 AM

## 2013-01-05 NOTE — Progress Notes (Signed)
Pt being transported to [facility] by her nephew. All of her nephew's questions were answered, including follow up with nephrologist, appt was made. IV d/c without complications. Pt d/c via wheelchair, accompanied by NT and her family member. Marry Guan

## 2013-01-05 NOTE — Clinical Social Work Note (Signed)
Pt d/c today to SNF. CSW presented bed offers and pt chooses [facility]. Daughter, Holley Raring also aware. Pt's nephew is coming in from out of town today and will transport pt. D/C summary faxed.  Benay Pike, [place]

## 2013-01-05 NOTE — Clinical Social Work Placement (Signed)
Clinical Social Work Department CLINICAL SOCIAL WORK PLACEMENT NOTE 01/05/2013  Patient:  [name], [name]  Account Number:  [id number] Admit date:  01/01/2013  Clinical Social Worker:  Benay Pike, LCSW  Date/time:  01/04/2013 01:55 PM  Clinical Social Work is seeking post-discharge placement for this patient at the following level of care:   [place]   (*CSW will update this form in Epic as items are completed)   01/04/2013  Patient/family provided with [place] Department of Clinical Social Work's list of facilities offering this level of care within the geographic area requested by the patient (or if unable, by the patient's family).  01/04/2013  Patient/family informed of their freedom to choose among providers that offer the needed level of care, that participate in Medicare, Medicaid or managed care program needed by the patient, have an available bed and are willing to accept the patient.  01/04/2013  Patient/family informed of [facility]' ownership interest in [facility], as well as of the fact that they are under no obligation to receive care at this facility.  PASARR submitted to EDS on 01/04/2013 PASARR number received from EDS on 01/04/2013  FL2 transmitted to all facilities in geographic area requested by pt/family on  01/04/2013 FL2 transmitted to all facilities within larger geographic area on   Patient informed that his/her managed care company has contracts with or will negotiate with  certain facilities, including the following:     Patient/family informed of bed offers received:  01/05/2013 Patient chooses bed at [facility] Physician recommends and patient chooses bed at  [facility]  Patient to be transferred to [facility] on  01/05/2013 Patient to be transferred to facility by family  The following physician request were entered in Epic:   Additional Comments:  Benay Pike, [place]

## 2013-01-13 ENCOUNTER — Encounter (HOSPITAL_COMMUNITY)
Admission: RE | Admit: 2013-01-13 | Discharge: 2013-01-13 | Disposition: A | Discharge status: Home / Self Care | Payer: Medicare Other | Source: Ambulatory Visit | Attending: Internal Medicine | Admitting: Internal Medicine

## 2013-01-13 DIAGNOSIS — D649 Anemia, unspecified: Secondary | ICD-10-CM | POA: Insufficient documentation

## 2013-01-13 LAB — HEMOGLOBIN AND HEMATOCRIT, BLOOD
HCT: 22.7 % — ABNORMAL LOW (ref 36.0–46.0)
Hemoglobin: 7.5 g/dL — ABNORMAL LOW (ref 12.0–15.0)

## 2013-01-13 NOTE — Progress Notes (Signed)
Results for NOELENE, RINES (MRN ID:[id number]) as of 01/13/2013 10:58  Ref. Range 01/13/2013 10:15  Hemoglobin Latest Range: 12.0-15.0 g/dL 7.5 (L)  HCT Latest Range: 36.0-46.0 % 22.7 (L)

## 2013-01-14 ENCOUNTER — Encounter (HOSPITAL_COMMUNITY)
Admission: RE | Admit: 2013-01-14 | Discharge: 2013-01-14 | Disposition: A | Discharge status: Home / Self Care | Payer: Medicare Other | Source: Ambulatory Visit | Attending: Internal Medicine | Admitting: Internal Medicine

## 2013-01-14 MED ORDER — SODIUM CHLORIDE 0.9 % IV SOLN
INTRAVENOUS | Status: DC
  Administered 2013-01-14: 500 mL via INTRAVENOUS

## 2013-01-15 LAB — TYPE AND SCREEN
Antibody Screen: NEGATIVE
Unit division: 0

## 2013-03-14 ENCOUNTER — Other Ambulatory Visit (HOSPITAL_COMMUNITY): Payer: Self-pay | Admitting: Internal Medicine

## 2013-03-14 DIAGNOSIS — M7989 Other specified soft tissue disorders: Secondary | ICD-10-CM

## 2013-03-14 DIAGNOSIS — M79605 Pain in left leg: Secondary | ICD-10-CM

## 2013-03-15 ENCOUNTER — Ambulatory Visit (HOSPITAL_COMMUNITY): Payer: Medicare Other

## 2013-03-18 ENCOUNTER — Ambulatory Visit (HOSPITAL_COMMUNITY)
Admission: RE | Admit: 2013-03-18 | Discharge: 2013-03-18 | Disposition: A | Discharge status: Home / Self Care | Payer: Medicare Other | Source: Ambulatory Visit | Attending: Internal Medicine | Admitting: Internal Medicine

## 2013-03-18 DIAGNOSIS — M79605 Pain in left leg: Secondary | ICD-10-CM

## 2013-03-18 DIAGNOSIS — M7989 Other specified soft tissue disorders: Secondary | ICD-10-CM

## 2013-03-18 DIAGNOSIS — M79609 Pain in unspecified limb: Secondary | ICD-10-CM | POA: Insufficient documentation

## 2013-08-24 ENCOUNTER — Encounter (HOSPITAL_COMMUNITY): Payer: Self-pay | Admitting: Emergency Medicine

## 2013-08-24 ENCOUNTER — Inpatient Hospital Stay (HOSPITAL_COMMUNITY)
Admission: EM | Admit: 2013-08-24 | Discharge: 2013-08-27 | DRG: 683 | Disposition: A | Discharge status: Skilled Nursing Facility | Payer: Medicare Other | Attending: Internal Medicine | Admitting: Internal Medicine

## 2013-08-24 DIAGNOSIS — Z951 Presence of aortocoronary bypass graft: Secondary | ICD-10-CM

## 2013-08-24 DIAGNOSIS — N179 Acute kidney failure, unspecified: Principal | ICD-10-CM

## 2013-08-24 DIAGNOSIS — Z833 Family history of diabetes mellitus: Secondary | ICD-10-CM

## 2013-08-24 DIAGNOSIS — I739 Peripheral vascular disease, unspecified: Secondary | ICD-10-CM | POA: Diagnosis present

## 2013-08-24 DIAGNOSIS — N184 Chronic kidney disease, stage 4 (severe): Secondary | ICD-10-CM | POA: Diagnosis present

## 2013-08-24 DIAGNOSIS — E1165 Type 2 diabetes mellitus with hyperglycemia: Secondary | ICD-10-CM

## 2013-08-24 DIAGNOSIS — E875 Hyperkalemia: Secondary | ICD-10-CM

## 2013-08-24 DIAGNOSIS — D631 Anemia in chronic kidney disease: Secondary | ICD-10-CM

## 2013-08-24 DIAGNOSIS — E119 Type 2 diabetes mellitus without complications: Secondary | ICD-10-CM

## 2013-08-24 DIAGNOSIS — M129 Arthropathy, unspecified: Secondary | ICD-10-CM | POA: Diagnosis present

## 2013-08-24 DIAGNOSIS — N039 Chronic nephritic syndrome with unspecified morphologic changes: Secondary | ICD-10-CM

## 2013-08-24 DIAGNOSIS — I129 Hypertensive chronic kidney disease with stage 1 through stage 4 chronic kidney disease, or unspecified chronic kidney disease: Secondary | ICD-10-CM | POA: Diagnosis present

## 2013-08-24 DIAGNOSIS — E785 Hyperlipidemia, unspecified: Secondary | ICD-10-CM | POA: Diagnosis present

## 2013-08-24 DIAGNOSIS — N189 Chronic kidney disease, unspecified: Secondary | ICD-10-CM

## 2013-08-24 DIAGNOSIS — E7800 Pure hypercholesterolemia, unspecified: Secondary | ICD-10-CM | POA: Diagnosis present

## 2013-08-24 DIAGNOSIS — I509 Heart failure, unspecified: Secondary | ICD-10-CM | POA: Diagnosis present

## 2013-08-24 DIAGNOSIS — F0390 Unspecified dementia without behavioral disturbance: Secondary | ICD-10-CM

## 2013-08-24 DIAGNOSIS — N2581 Secondary hyperparathyroidism of renal origin: Secondary | ICD-10-CM | POA: Diagnosis present

## 2013-08-24 DIAGNOSIS — N390 Urinary tract infection, site not specified: Secondary | ICD-10-CM | POA: Diagnosis present

## 2013-08-24 DIAGNOSIS — I10 Essential (primary) hypertension: Secondary | ICD-10-CM

## 2013-08-24 DIAGNOSIS — Z79899 Other long term (current) drug therapy: Secondary | ICD-10-CM

## 2013-08-24 DIAGNOSIS — D509 Iron deficiency anemia, unspecified: Secondary | ICD-10-CM | POA: Diagnosis present

## 2013-08-24 DIAGNOSIS — IMO0001 Reserved for inherently not codable concepts without codable children: Secondary | ICD-10-CM | POA: Diagnosis present

## 2013-08-24 DIAGNOSIS — N19 Unspecified kidney failure: Secondary | ICD-10-CM

## 2013-08-24 DIAGNOSIS — E871 Hypo-osmolality and hyponatremia: Secondary | ICD-10-CM | POA: Diagnosis present

## 2013-08-24 DIAGNOSIS — I2510 Atherosclerotic heart disease of native coronary artery without angina pectoris: Secondary | ICD-10-CM | POA: Diagnosis present

## 2013-08-24 DIAGNOSIS — D696 Thrombocytopenia, unspecified: Secondary | ICD-10-CM | POA: Diagnosis present

## 2013-08-24 DIAGNOSIS — R296 Repeated falls: Secondary | ICD-10-CM

## 2013-08-24 DIAGNOSIS — Z9181 History of falling: Secondary | ICD-10-CM

## 2013-08-24 LAB — BASIC METABOLIC PANEL
BUN: 85 mg/dL — ABNORMAL HIGH (ref 6–23)
CHLORIDE: 96 meq/L (ref 96–112)
CO2: 23 mEq/L (ref 19–32)
Calcium: 8.7 mg/dL (ref 8.4–10.5)
Creatinine, Ser: 6.87 mg/dL — ABNORMAL HIGH (ref 0.50–1.10)
GFR calc Af Amer: 6 mL/min — ABNORMAL LOW (ref >90)
GFR, EST NON AFRICAN AMERICAN: 5 mL/min — AB (ref >90)
Glucose, Bld: 96 mg/dL (ref 70–99)
Potassium: 5.7 mEq/L — ABNORMAL HIGH (ref 3.7–5.3)
SODIUM: 135 meq/L — AB (ref 137–147)

## 2013-08-24 LAB — CBC WITH DIFFERENTIAL/PLATELET
Basophils Absolute: 0 10*3/uL (ref 0.0–0.1)
Basophils Relative: 0 % (ref 0–1)
EOS ABS: 0.1 10*3/uL (ref 0.0–0.7)
Eosinophils Relative: 1 % (ref 0–5)
HCT: 24.8 % — ABNORMAL LOW (ref 36.0–46.0)
HEMOGLOBIN: 8 g/dL — AB (ref 12.0–15.0)
Lymphocytes Relative: 9 % — ABNORMAL LOW (ref 12–46)
Lymphs Abs: 0.5 10*3/uL — ABNORMAL LOW (ref 0.7–4.0)
MCH: 29 pg (ref 26.0–34.0)
MCHC: 32.3 g/dL (ref 30.0–36.0)
MCV: 89.9 fL (ref 78.0–100.0)
Monocytes Absolute: 0.6 10*3/uL (ref 0.1–1.0)
Monocytes Relative: 10 % (ref 3–12)
NEUTROS PCT: 80 % — AB (ref 43–77)
Neutro Abs: 4.9 10*3/uL (ref 1.7–7.7)
PLATELETS: 147 10*3/uL — AB (ref 150–400)
RBC: 2.76 MIL/uL — ABNORMAL LOW (ref 3.87–5.11)
RDW: 13.6 % (ref 11.5–15.5)
WBC: 6.1 10*3/uL (ref 4.0–10.5)

## 2013-08-24 LAB — URIC ACID: URIC ACID, SERUM: 8.7 mg/dL — AB (ref 2.4–7.0)

## 2013-08-24 LAB — URINALYSIS, ROUTINE W REFLEX MICROSCOPIC
Bilirubin Urine: NEGATIVE
Glucose, UA: NEGATIVE mg/dL
HGB URINE DIPSTICK: NEGATIVE
Ketones, ur: NEGATIVE mg/dL
Nitrite: NEGATIVE
PH: 5.5 (ref 5.0–8.0)
Protein, ur: NEGATIVE mg/dL
SPECIFIC GRAVITY, URINE: 1.01 (ref 1.005–1.030)
Urobilinogen, UA: 0.2 mg/dL (ref 0.0–1.0)

## 2013-08-24 LAB — URINE MICROSCOPIC-ADD ON

## 2013-08-24 LAB — MAGNESIUM: MAGNESIUM: 2.3 mg/dL (ref 1.5–2.5)

## 2013-08-24 LAB — GLUCOSE, CAPILLARY: Glucose-Capillary: 151 mg/dL — ABNORMAL HIGH (ref 70–99)

## 2013-08-24 MED ORDER — ACETAMINOPHEN 325 MG PO TABS: 650.0000 mg | ORAL_TABLET | Freq: Four times a day (QID) | ORAL | Status: DC | PRN

## 2013-08-24 MED ORDER — INSULIN ASPART 100 UNIT/ML ~~LOC~~ SOLN: 0.0000 [IU] | Freq: Every day | SUBCUTANEOUS | Status: DC

## 2013-08-24 MED ORDER — ACETAMINOPHEN 650 MG RE SUPP: 650.0000 mg | Freq: Four times a day (QID) | RECTAL | Status: DC | PRN

## 2013-08-24 MED ORDER — INSULIN ASPART 100 UNIT/ML ~~LOC~~ SOLN: 0.0000 [IU] | Freq: Three times a day (TID) | SUBCUTANEOUS | Status: DC

## 2013-08-24 MED ORDER — PIOGLITAZONE HCL 15 MG PO TABS
30.0000 mg | ORAL_TABLET | Freq: Every day | ORAL | Status: DC
  Administered 2013-08-25: 30 mg via ORAL
  Filled 2013-08-24 (×3): qty 1

## 2013-08-24 MED ORDER — CILOSTAZOL 100 MG PO TABS
100.0000 mg | ORAL_TABLET | Freq: Two times a day (BID) | ORAL | Status: DC
  Administered 2013-08-24 – 2013-08-27 (×6): 100 mg via ORAL
  Filled 2013-08-24 (×10): qty 1

## 2013-08-24 MED ORDER — DONEPEZIL HCL 5 MG PO TABS
10.0000 mg | ORAL_TABLET | Freq: Every day | ORAL | Status: DC
Start: 2013-08-24 — End: 2013-08-27
  Administered 2013-08-24 – 2013-08-26 (×3): 10 mg via ORAL
  Filled 2013-08-24 (×3): qty 2

## 2013-08-24 MED ORDER — ONDANSETRON HCL 4 MG PO TABS: 4.0000 mg | ORAL_TABLET | Freq: Four times a day (QID) | ORAL | Status: DC | PRN

## 2013-08-24 MED ORDER — DEXTROSE 5 % IV SOLN
INTRAVENOUS | Status: AC
  Filled 2013-08-24: qty 10

## 2013-08-24 MED ORDER — SODIUM CHLORIDE 0.9 % IV SOLN
INTRAVENOUS | Status: DC
Start: 2013-08-24 — End: 2013-08-25
  Administered 2013-08-24 – 2013-08-25 (×2): via INTRAVENOUS

## 2013-08-24 MED ORDER — AMLODIPINE BESYLATE 5 MG PO TABS
10.0000 mg | ORAL_TABLET | Freq: Every morning | ORAL | Status: DC
  Administered 2013-08-25 – 2013-08-27 (×3): 10 mg via ORAL
  Filled 2013-08-24 (×3): qty 2

## 2013-08-24 MED ORDER — LINAGLIPTIN 5 MG PO TABS
5.0000 mg | ORAL_TABLET | Freq: Every morning | ORAL | Status: DC
  Administered 2013-08-25: 5 mg via ORAL
  Filled 2013-08-24: qty 1

## 2013-08-24 MED ORDER — ONDANSETRON HCL 4 MG/2ML IJ SOLN: 4.0000 mg | Freq: Four times a day (QID) | INTRAMUSCULAR | Status: DC | PRN

## 2013-08-24 MED ORDER — FERROUS SULFATE 325 (65 FE) MG PO TABS
325.0000 mg | ORAL_TABLET | Freq: Two times a day (BID) | ORAL | Status: DC
  Administered 2013-08-24 – 2013-08-26 (×5): 325 mg via ORAL
  Filled 2013-08-24 (×5): qty 1

## 2013-08-24 MED ORDER — SODIUM BICARBONATE 650 MG PO TABS
650.0000 mg | ORAL_TABLET | Freq: Two times a day (BID) | ORAL | Status: DC
  Administered 2013-08-24 – 2013-08-27 (×6): 650 mg via ORAL
  Filled 2013-08-24 (×6): qty 1

## 2013-08-24 MED ORDER — CILOSTAZOL 100 MG PO TABS
ORAL_TABLET | ORAL | Status: AC
  Filled 2013-08-24: qty 1

## 2013-08-24 MED ORDER — ACETAMINOPHEN-CODEINE #3 300-30 MG PO TABS: 1.0000 | ORAL_TABLET | Freq: Three times a day (TID) | ORAL | Status: DC | PRN

## 2013-08-24 MED ORDER — HEPARIN SODIUM (PORCINE) 5000 UNIT/ML IJ SOLN
5000.0000 [IU] | Freq: Three times a day (TID) | INTRAMUSCULAR | Status: DC
  Administered 2013-08-24 – 2013-08-27 (×10): 5000 [IU] via SUBCUTANEOUS
  Filled 2013-08-24 (×10): qty 1

## 2013-08-24 MED ORDER — CEFTRIAXONE SODIUM 1 G IJ SOLR
1.0000 g | INTRAMUSCULAR | Status: DC
  Administered 2013-08-24 – 2013-08-25 (×2): 1 g via INTRAVENOUS
  Filled 2013-08-24 (×2): qty 10

## 2013-08-24 MED ORDER — ATORVASTATIN CALCIUM 10 MG PO TABS
10.0000 mg | ORAL_TABLET | Freq: Every day | ORAL | Status: DC
  Administered 2013-08-24 – 2013-08-26 (×3): 10 mg via ORAL
  Filled 2013-08-24 (×3): qty 1

## 2013-08-24 MED ORDER — SODIUM CHLORIDE 0.9 % IJ SOLN
3.0000 mL | Freq: Two times a day (BID) | INTRAMUSCULAR | Status: DC
  Administered 2013-08-24 – 2013-08-27 (×5): 3 mL via INTRAVENOUS

## 2013-08-24 MED ORDER — SODIUM POLYSTYRENE SULFONATE 15 GM/60ML PO SUSP
30.0000 g | Freq: Once | ORAL | Status: AC
  Administered 2013-08-24: 30 g via ORAL
  Filled 2013-08-24: qty 120

## 2013-08-24 MED ORDER — VITAMIN D (ERGOCALCIFEROL) 1.25 MG (50000 UNIT) PO CAPS
50000.0000 [IU] | ORAL_CAPSULE | ORAL | Status: DC
  Administered 2013-08-25: 50000 [IU] via ORAL
  Filled 2013-08-24: qty 1

## 2013-08-24 MED ORDER — GABAPENTIN 300 MG PO CAPS
300.0000 mg | ORAL_CAPSULE | Freq: Every day | ORAL | Status: DC
  Administered 2013-08-24 – 2013-08-26 (×3): 300 mg via ORAL
  Filled 2013-08-24 (×3): qty 1

## 2013-08-24 NOTE — ED Provider Notes (Signed)
CSN: [id number]     Arrival date & time 08/24/13  1112 History  This chart was scribed for Nat Christen, MD by Elby Beck, ED Scribe. This patient was seen in room APA06/APA06 and the patient's care was started at 1:59 PM.   Chief Complaint  Patient presents with  . Weakness    The history is provided by the patient. No language interpreter was used.   LEVEL 5 CAVEAT (Confusion)  HPI Comments: [name] is a 80 y.o. Female with a history of DM, HTN and chronic kidney disease who presents to the Emergency Department complaining of generalized weakness over the past few days. She also states that she feels "jerky". She states that she saw her Cardiologist yesterday and had blood work obtained. There was a concern that her creatinine was abnormal and dialysis is being considered for her. She states that she lives alone at home.    Past Medical History  Diagnosis Date  . Anemia of chronic disease 10/05/2010  . Diabetes mellitus   . Hypertension   . High cholesterol   . Neuropathy   . Anemia, iron deficiency 08/18/2012  . Arthritis   . Chronic kidney disease   . Shortness of breath    Past Surgical History  Procedure Laterality Date  . Heart bypass surgery    . Left eye surgery    . Eye surgery    . Coronary artery bypass graft     Family History  Problem Relation Age of Onset  . Diabetes Father   . Cancer Sister   . Aneurysm Mother    History  Substance Use Topics  . Smoking status: Never Smoker   . Smokeless tobacco: Never Used  . Alcohol Use: No   OB History   Grav Para Term Preterm Abortions TAB SAB Ect Mult Living                 Review of Systems A complete 10 system review of systems was obtained and all systems are negative except as noted in the HPI and PMH.   Allergies  Review of patient's allergies indicates no known allergies.  Home Medications   Prior to Admission medications   Medication Sig Start Date End Date Taking? Authorizing Provider   acetaminophen-codeine (TYLENOL #3) 300-30 MG per tablet Take 1 tablet by mouth every 8 (eight) hours as needed for moderate pain.   Yes Historical Provider, MD  amLODipine (NORVASC) 10 MG tablet Take 10 mg by mouth every morning. For BLOOD PRESSURE   Yes Historical Provider, MD  cilostazol (PLETAL) 100 MG tablet Take 100 mg by mouth 2 (two) times daily. For CIRCULATION or AS DIRECTED by your physician   Yes Historical Provider, MD  donepezil (ARICEPT) 10 MG tablet Take 10 mg by mouth at bedtime. For MEMORY   Yes Historical Provider, MD  enalapril (VASOTEC) 20 MG tablet Take 20 mg by mouth daily. For BLOOD PRESSURE   Yes Historical Provider, MD  ferrous sulfate 325 (65 FE) MG tablet Take 325 mg by mouth 2 (two) times daily. As SUPPLEMENT   Yes Historical Provider, MD  gabapentin (NEURONTIN) 300 MG capsule Take 300 mg by mouth at bedtime. For NEUROPATHY/NERVE PAIN   Yes Historical Provider, MD  glipiZIDE (GLUCOTROL XL) 5 MG 24 hr tablet Take 5 mg by mouth daily. For BLOOD SUGARS/DIABETES   Yes Historical Provider, MD  linagliptin (TRADJENTA) 5 MG TABS tablet Take 5 mg by mouth every morning. For BLOOD SUGARS/DIABETES  Yes Historical Provider, MD  pioglitazone (ACTOS) 30 MG tablet Take 30 mg by mouth daily.   Yes Historical Provider, MD  rosuvastatin (CRESTOR) 5 MG tablet Take 5 mg by mouth every morning. For CHOLESTEROL   Yes Historical Provider, MD  sodium bicarbonate 650 MG tablet Take 650 mg by mouth 2 (two) times daily.   Yes Historical Provider, MD  torsemide (DEMADEX) 20 MG tablet Take 40 mg by mouth 2 (two) times daily.   Yes Historical Provider, MD  Vitamin D, Ergocalciferol, (DRISDOL) 50000 UNITS CAPS capsule Take 1 capsule (50,000 Units total) by mouth every 7 (seven) days. 01/05/13  Yes Rosita Fire, MD   Triage Vitals: BP 134/84  Pulse 72  Temp(Src) 98.7 F (37.1 C) (Oral)  Resp 18  Ht 5\' 6"  (1.676 m)  Wt 180 lb (81.647 kg)  BMI 29.07 kg/m2  SpO2 96%  Physical Exam  Nursing  note and vitals reviewed. Constitutional: She is oriented to person, place, and time. She appears well-developed and well-nourished.  HENT:  Head: Normocephalic and atraumatic.  Eyes: Conjunctivae and EOM are normal. Pupils are equal, round, and reactive to light.  Neck: Normal range of motion. Neck supple.  Cardiovascular: Normal rate, regular rhythm and normal heart sounds.   Pulmonary/Chest: Effort normal and breath sounds normal.  Abdominal: Soft. Bowel sounds are normal.  Musculoskeletal: Normal range of motion.  Neurological: She is alert and oriented to person, place, and time.  Skin: Skin is warm and dry.  Psychiatric: She has a normal mood and affect. Her behavior is normal.    ED Course  Procedures (including critical care time)  DIAGNOSTIC STUDIES: Oxygen Saturation is 96% on RA, normal by my interpretation.    COORDINATION OF CARE: 2:02 PM- Discussed pt's case with her Nephrologist who advised a family discussion on putting the pt on dialysis. Pt advised of plan for treatment and pt agrees.  Results for orders placed during the hospital encounter of 08/24/13  CBC WITH DIFFERENTIAL      Result Value Ref Range   WBC 6.1  4.0 - 10.5 K/uL   RBC 2.76 (*) 3.87 - 5.11 MIL/uL   Hemoglobin 8.0 (*) 12.0 - 15.0 g/dL   HCT 24.8 (*) 36.0 - 46.0 %   MCV 89.9  78.0 - 100.0 fL   MCH 29.0  26.0 - 34.0 pg   MCHC 32.3  30.0 - 36.0 g/dL   RDW 13.6  11.5 - 15.5 %   Platelets 147 (*) 150 - 400 K/uL   Neutrophils Relative % 80 (*) 43 - 77 %   Neutro Abs 4.9  1.7 - 7.7 K/uL   Lymphocytes Relative 9 (*) 12 - 46 %   Lymphs Abs 0.5 (*) 0.7 - 4.0 K/uL   Monocytes Relative 10  3 - 12 %   Monocytes Absolute 0.6  0.1 - 1.0 K/uL   Eosinophils Relative 1  0 - 5 %   Eosinophils Absolute 0.1  0.0 - 0.7 K/uL   Basophils Relative 0  0 - 1 %   Basophils Absolute 0.0  0.0 - 0.1 K/uL  BASIC METABOLIC PANEL      Result Value Ref Range   Sodium 135 (*) 137 - 147 mEq/L   Potassium 5.7 (*) 3.7 - 5.3  mEq/L   Chloride 96  96 - 112 mEq/L   CO2 23  19 - 32 mEq/L   Glucose, Bld 96  70 - 99 mg/dL   BUN 85 (*) 6 - 23  mg/dL   Creatinine, Ser 6.87 (*) 0.50 - 1.10 mg/dL   Calcium 8.7  8.4 - 10.5 mg/dL   GFR calc non Af Amer 5 (*) >90 mL/min   GFR calc Af Amer 6 (*) >90 mL/min  URINALYSIS, ROUTINE W REFLEX MICROSCOPIC      Result Value Ref Range   Color, Urine YELLOW  YELLOW   APPearance HAZY (*) CLEAR   Specific Gravity, Urine 1.010  1.005 - 1.030   pH 5.5  5.0 - 8.0   Glucose, UA NEGATIVE  NEGATIVE mg/dL   Hgb urine dipstick NEGATIVE  NEGATIVE   Bilirubin Urine NEGATIVE  NEGATIVE   Ketones, ur NEGATIVE  NEGATIVE mg/dL   Protein, ur NEGATIVE  NEGATIVE mg/dL   Urobilinogen, UA 0.2  0.0 - 1.0 mg/dL   Nitrite NEGATIVE  NEGATIVE   Leukocytes, UA TRACE (*) NEGATIVE  URINE MICROSCOPIC-ADD ON      Result Value Ref Range   Squamous Epithelial / LPF FEW (*) RARE   WBC, UA 0-2  <3 WBC/hpf   Bacteria, UA FEW (*) RARE   Imaging Review No results found.   EKG Interpretation None      MDM   Final diagnoses:  Renal failure    Care was discussed with local nephrologist. There is a question as to whether the patient will receive dialysis or not. Her nephew will be arriving tomorrow from [place] to further discuss this issue. Creatinine today is 6.8. Admit to general medicine.   I personally performed the services described in this documentation, which was scribed in my presence. The recorded information has been reviewed and is accurate.    Nat Christen, MD 08/24/13 [phone]

## 2013-08-24 NOTE — ED Notes (Signed)
RN at bedside

## 2013-08-24 NOTE — ED Notes (Addendum)
Generalized weakness with recent falls. States generalized weakness since Monday. Pt also states she has fallen twice today. Pain to right knee since falling this morning and pain to right upper arm. A/O VSS.

## 2013-08-24 NOTE — Consult Note (Signed)
[name] MRN: [id number] DOB/AGE: Sep 25, 1930 80 y.o. Primary Care Physician:KIM, [name], MD Admit date: 08/24/2013 Chief Complaint:  Chief Complaint  Patient presents with  . Weakness   HPI: Pt is 80 year old african [place] female with past medical hx of CKd , DM who presented to ER with c/o weakness.  HPI dates back to last week when pt was started feeling progressively weak. Pt also gave hx of 3-4  Falls. NO hx of syncope No c/o head trauma Pt says " My knees gave out" Pt c/o malaise Pt c/o lethargy Pt c/o change in taste Pt c/o decreased appetite  NO c/o chest pain    Past Medical History  Diagnosis Date  . Anemia of chronic disease 10/05/2010  . Diabetes mellitus   . Hypertension   . High cholesterol   . Neuropathy   . Anemia, iron deficiency 08/18/2012  . Arthritis   . Chronic kidney disease   . Shortness of breath         Family History  Problem Relation Age of Onset  . Diabetes Father   . Cancer Sister   . Aneurysm Mother     Social History:  reports that she has never smoked. She has never used smokeless tobacco. She reports that she does not drink alcohol or use illicit drugs.   Allergies: No Known Allergies  Medications Prior to Admission  Medication Sig Dispense Refill  . acetaminophen-codeine (TYLENOL #3) 300-30 MG per tablet Take 1 tablet by mouth every 8 (eight) hours as needed for moderate pain.      Marland Kitchen amLODipine (NORVASC) 10 MG tablet Take 10 mg by mouth every morning. For BLOOD PRESSURE      . cilostazol (PLETAL) 100 MG tablet Take 100 mg by mouth 2 (two) times daily. For CIRCULATION or AS DIRECTED by your physician      . donepezil (ARICEPT) 10 MG tablet Take 10 mg by mouth at bedtime. For MEMORY      . enalapril (VASOTEC) 20 MG tablet Take 20 mg by mouth daily. For BLOOD PRESSURE      . ferrous sulfate 325 (65 FE) MG tablet Take 325 mg by mouth 2 (two) times daily. As SUPPLEMENT      . gabapentin (NEURONTIN) 300 MG capsule Take 300  mg by mouth at bedtime. For NEUROPATHY/NERVE PAIN      . glipiZIDE (GLUCOTROL XL) 5 MG 24 hr tablet Take 5 mg by mouth daily. For BLOOD SUGARS/DIABETES      . linagliptin (TRADJENTA) 5 MG TABS tablet Take 5 mg by mouth every morning. For BLOOD SUGARS/DIABETES      . pioglitazone (ACTOS) 30 MG tablet Take 30 mg by mouth daily.      . rosuvastatin (CRESTOR) 5 MG tablet Take 5 mg by mouth every morning. For CHOLESTEROL      . sodium bicarbonate 650 MG tablet Take 650 mg by mouth 2 (two) times daily.      Marland Kitchen torsemide (DEMADEX) 20 MG tablet Take 40 mg by mouth 2 (two) times daily.      . Vitamin D, Ergocalciferol, (DRISDOL) 50000 UNITS CAPS capsule Take 1 capsule (50,000 Units total) by mouth every 7 (seven) days.  30 capsule  3       [id number] from the symptoms mentioned above,there are no other symptoms referable to all systems reviewed.  Derrill Memo ON 08/25/2013] amLODipine  10 mg Oral q morning - 10a  . atorvastatin  10 mg Oral q1800  .  cefTRIAXone (ROCEPHIN)  IV  1 g Intravenous Q24H  . cilostazol  100 mg Oral BID  . donepezil  10 mg Oral QHS  . ferrous sulfate  325 mg Oral BID  . gabapentin  300 mg Oral QHS  . heparin  5,000 Units Subcutaneous 3 times per day  . [START ON 08/25/2013] insulin aspart  0-15 Units Subcutaneous TID WC  . insulin aspart  0-5 Units Subcutaneous QHS  . [START ON 08/25/2013] linagliptin  5 mg Oral q morning - 10a  . pioglitazone  30 mg Oral Daily  . sodium bicarbonate  650 mg Oral BID  . sodium chloride  3 mL Intravenous Q12H  . [START ON 08/25/2013] Vitamin D (Ergocalciferol)  50,000 Units Oral Q7 days     Physical Exam: Vital signs in last 24 hours: Temp:  [98.6 F (37 C)-98.7 F (37.1 C)] 98.6 F (37 C) (05/27 1742) Pulse Rate:  [72-144] 144 (05/27 1742) Resp:  [9-21] 21 (05/27 1630) BP: (101-148)/(51-84) 148/80 mmHg (05/27 1742) SpO2:  [94 %-99 %] 99 % (05/27 1742) Weight:  [180 lb (81.647 kg)-192 lb 7.4 oz (87.3 kg)] 192 lb 7.4 oz (87.3 kg) (05/27  1742) Weight change:  Last BM Date: 08/23/13  Intake/Output from previous day:       Physical Exam: General- pt is awake,alert, oriented to time place and person Resp- No acute REsp distress,  Rhonchi+ CVS- S1S2 regular in rate and rhythm, NO rubs  GIT- BS+, soft, NT, ND EXT- 1+ LE Edema, NO Cyanosis CNS- CN 2-12 grossly intact. Moving all 4 extremities            Asterexis Present  Psych- normal mood and affect    Lab Results: CBC  Recent Labs  08/24/13 1156  WBC 6.1  HGB 8.0*  HCT 24.8*  PLT 147*    BMET  Recent Labs  08/24/13 1156  NA 135*  K 5.7*  CL 96  CO2 23  GLUCOSE 96  BUN 85*  CREATININE 6.87*  CALCIUM 8.7    Trend Creat  2015   3.52( as outpt)==>6.87 2014    2.5--5.9 2008    1.43      MICRO No results found for this or any previous visit (from the past 240 hour(s)).    Lab Results  Component Value Date   PTH 245.0* 01/02/2013   CALCIUM 8.7 08/24/2013   PHOS 4.4 01/03/2013      Impression: 1)Renal  AKI on CKD vs CKD progression               AKI sec to intravascular volume depletion.               CKD stage 5.               CKD since 2008               CKD secondary to DM/ age associated decline                Progression of CKD as expected with DM                 Proteinura  Present.                 Pt now had uremic symptoms and signs                Malaise +Change in taste+ change in appetite + Asterexis+Hyperkalemia   2)HTN Medication-  On Diuretics-Lasix as outpt  On Calcium Channel Blockers    3)Anemia HGb not  at goal (9--11) Pt was reluctant to start ESA as that required shots every few weeks.  4)CKD Mineral-Bone Disorder PTH elevated . Secondary Hyperparathyroidism present . Phosphorus at goal.   5)CNS- hx of dementia Primary MD following  6)Electrolytes  Hyperkalemic  Hyponatremic Sec to near ESRD and inability to get rid of free water   7)Acid base Co2 at goal On PO bicarb      Plan:  Will hold off diuretics an agree with gentle hydration to see if it helps.  Will manage hyperkalemia medically for now  I had extensive discussion with pt , pt sister about need to initiate renal replacement therapy. Pt is not sure.Pt and the family are awaiting the pt nephew to arrive from [place] before they make decision. Will wait till am, if pt in agreement -will ask for permacath.and will also see if any improvement in GFR.           Janel Beane S Domenico Achord 08/24/2013, 7:32 PM

## 2013-08-24 NOTE — H&P (Signed)
[facility] History and Physical  [name] [id number] DOB: [birth year] DOA: 08/24/2013  Referring physician: Dr. Lacinda Axon PCP: Jani Gravel, MD   Chief Complaint:  Weakness with frequent falls for past 4-5 days   HPI:  80 year old female with history of chronic kidney disease stage IV (creatinine around 2 about 8 months back), hypertension, CAD with history of CABG, anemia of chronic disease, diabetes mellitus on oral hypoglycemic, peripheral neuropathy, arthritis and mild dementia who presented to the hospital with frequent falls for past 4-5 days. He should reports being generalized weakness for almost one week . She also reports poor appetite and drinking very little fluids. Patient lives alone and is independent with most of her ADLs. She reports cooking for herself whenever possible. Patient reports falling down 3 days back landing on her knees but denied standing any injury or loss of consciousness. She fell down 3 times since this morning and had difficulty getting up. She again did not sustain any injury or loss of consciousness. She reports she has been feeling increasingly sleepy for last 4-5 days. Patient denies headache, dizziness, fever, chills, nausea , vomiting, chest pain, palpitations, SOB, abdominal pain, bowel or urinary symptoms. Denies change in weight . She denies any changes in her medications. She reports her blood glucose to be stable (checks occasionally). He denies any recent illness or sick contacts. Patient reports taking ibuprofen off and on for pain. As per the ED physician she visited her nephrologist yesterday and was found to have elevated BUN and creatinine during routine labs as well and her nephrologist at all the ED today have her admitted.  Course in the ED Patient's vitals were stable. Blood work showed hemoglobin of 8 (at baseline) hematocrit of 24.8, platelets of 147. Chemistry showed sodium of 135, potassium of 5.7, chloride of 96, CO2 of 23,  BUN of 85 and creatinine of 6.87. UA was suggestive of UTI. Given findings of acute kidney injury and hypokalemia prior hospitalists called for admission to telemetry.   Review of Systems:  Constitutional: Denies fever, chills, diaphoresis, appetite change and fatigue.  HEENT: Denies photophobia, eye pain, redness, hearing loss, ear pain, congestion, sore throat, rhinorrhea, sneezing, mouth sores, trouble swallowing, neck pain,  Respiratory: Denies SOB, DOE, cough, chest tightness,  and wheezing.   Cardiovascular: Denies chest pain, palpitations and leg swelling.  Gastrointestinal: Denies nausea, vomiting, abdominal pain, diarrhea, constipation, blood in stool and abdominal distention.  Genitourinary: Denies dysuria, urgency, frequency, hematuria, flank pain and difficulty urinating.  Endocrine: Denies: hot or cold intolerance,polyuria, polydipsia. Musculoskeletal: Denies myalgias, back pain, joint swelling, arthralgias and gait problem.  Skin: Denies pallor, rash and wound.  Neurological: Generalized weakness and increased sleepiness , Denies dizziness, seizures, syncope, weakness, light-headedness, numbness and headaches.  Psychiatric/Behavioral: Denies confusion,  and agitation   Past Medical History  Diagnosis Date  . Anemia of chronic disease 10/05/2010  . Diabetes mellitus   . Hypertension   . High cholesterol   . Neuropathy   . Anemia, iron deficiency 08/18/2012  . Arthritis   . Chronic kidney disease   . Shortness of breath    Past Surgical History  Procedure Laterality Date  . Heart bypass surgery    . Left eye surgery    . Eye surgery    . Coronary artery bypass graft     Social History:  reports that she has never smoked. She has never used smokeless tobacco. She reports that she does not drink alcohol or use illicit drugs.  No Known Allergies  Family History  Problem Relation Age of Onset  . Diabetes Father   . Cancer Sister   . Aneurysm Mother     Prior to  Admission medications   Medication Sig Start Date End Date Taking? Authorizing Provider  acetaminophen-codeine (TYLENOL #3) 300-30 MG per tablet Take 1 tablet by mouth every 8 (eight) hours as needed for moderate pain.   Yes Historical Provider, MD  amLODipine (NORVASC) 10 MG tablet Take 10 mg by mouth every morning. For BLOOD PRESSURE   Yes Historical Provider, MD  cilostazol (PLETAL) 100 MG tablet Take 100 mg by mouth 2 (two) times daily. For CIRCULATION or AS DIRECTED by your physician   Yes Historical Provider, MD  donepezil (ARICEPT) 10 MG tablet Take 10 mg by mouth at bedtime. For MEMORY   Yes Historical Provider, MD  enalapril (VASOTEC) 20 MG tablet Take 20 mg by mouth daily. For BLOOD PRESSURE   Yes Historical Provider, MD  ferrous sulfate 325 (65 FE) MG tablet Take 325 mg by mouth 2 (two) times daily. As SUPPLEMENT   Yes Historical Provider, MD  gabapentin (NEURONTIN) 300 MG capsule Take 300 mg by mouth at bedtime. For NEUROPATHY/NERVE PAIN   Yes Historical Provider, MD  glipiZIDE (GLUCOTROL XL) 5 MG 24 hr tablet Take 5 mg by mouth daily. For BLOOD SUGARS/DIABETES   Yes Historical Provider, MD  linagliptin (TRADJENTA) 5 MG TABS tablet Take 5 mg by mouth every morning. For BLOOD SUGARS/DIABETES   Yes Historical Provider, MD  pioglitazone (ACTOS) 30 MG tablet Take 30 mg by mouth daily.   Yes Historical Provider, MD  rosuvastatin (CRESTOR) 5 MG tablet Take 5 mg by mouth every morning. For CHOLESTEROL   Yes Historical Provider, MD  sodium bicarbonate 650 MG tablet Take 650 mg by mouth 2 (two) times daily.   Yes Historical Provider, MD  torsemide (DEMADEX) 20 MG tablet Take 40 mg by mouth 2 (two) times daily.   Yes Historical Provider, MD  Vitamin D, Ergocalciferol, (DRISDOL) 50000 UNITS CAPS capsule Take 1 capsule (50,000 Units total) by mouth every 7 (seven) days. 01/05/13  Yes Rosita Fire, MD     Physical Exam:  Filed Vitals:   08/24/13 1400 08/24/13 1448 08/24/13 1500 08/24/13 1600   BP: 128/54 115/52 137/64 128/52  Pulse:  75    Temp:      TempSrc:      Resp: 12 16 9 10   Height:      Weight:      SpO2:  94%      Constitutional: Vital signs reviewed.  Patient is an elderly female in no acute distress. HEENT: no pallor, no icterus, dry oral mucosa Cardiovascular: RRR, S1 normal, S2 normal, no MRG Chest: CTAB, no wheezes, rales, or rhonchi Abdominal: Soft. Non-tender, non-distended, bowel sounds are normal,  Ext: warm, no edema Neurological: A&O x3, non focal, flapping tremors+  Labs on Admission:  Basic Metabolic Panel:  Recent Labs Lab 08/24/13 1156  NA 135*  K 5.7*  CL 96  CO2 23  GLUCOSE 96  BUN 85*  CREATININE 6.87*  CALCIUM 8.7   Liver Function Tests: No results found for this basename: AST, ALT, ALKPHOS, BILITOT, PROT, ALBUMIN,  in the last 168 hours No results found for this basename: LIPASE, AMYLASE,  in the last 168 hours No results found for this basename: AMMONIA,  in the last 168 hours CBC:  Recent Labs Lab 08/24/13 1156  WBC 6.1  NEUTROABS 4.9  HGB  8.0*  HCT 24.8*  MCV 89.9  PLT 147*   Cardiac Enzymes: No results found for this basename: CKTOTAL, CKMB, CKMBINDEX, TROPONINI,  in the last 168 hours BNP: No components found with this basename: POCBNP,  CBG: No results found for this basename: GLUCAP,  in the last 168 hours  Radiological Exams on Admission: No results found.  EKG: Pending  Assessment/Plan  Principal Problem:   Acute on chronic kidney disease, stage 4 Admit to telemetry. Patient has mild uremic symptoms. -Symptoms likely prerenal with dehydration . Possible ATN with concomitant use of ACEi, torsemide , NSAIDs and underlying UTI also favorable. -Discontinue torsemide and lisinopril. Will place her on gentle IV hydration with normal saline. Monitor strict I/O. neuro checks given uremic symptoms. -Patient following with Dr. Hinda Lenis as outpatient. She reports that he had discussed options of dialysis with  her and she wanted to wait another month to see for improvement in her renal function. -Check PTH and uric acid. Continue vitamin D. -renal consult in am.  Active Problems:  Hyperkalemia Has PVCs on monitor. Check EKG and mg. Will order kayexalate. Monitor lytes in am.   Frequent falls PT evaluation. May benefit from skilled nursing   Diabetes mellitus Hold glipizide. Resume actos and trajenta. Check A1C, monitor fsg. continue SSI  UTI We'll place her on empiric Rocephin. Follow culture.    Anemia in chronic renal disease/iron deficiency Hemoglobin at baseline. Continue iron supplement    HTN (hypertension) Blood pressure stable. Hold torsemide and lisinopril. I would place her on when necessary hydralazine.  Hyperlipidemia Will place her on Lipitor    CAD with Hx of CABG statin  Peripheral vascular disease Continue cilostazol    Dementia Continue Namenda     Diet:diabetic  DVT prophylaxis: sq heparin   Code Status:full code Family Communication: discussed with friend  at bedside Disposition Plan: pending further w/up and PT eval  [name] [facility] Pager [phone]  Total time spent on admission :70 minutes  If 7PM-7AM, please contact night-coverage [email or web address] Password [facility] 08/24/2013, 4:58 PM

## 2013-08-25 DIAGNOSIS — D631 Anemia in chronic kidney disease: Secondary | ICD-10-CM

## 2013-08-25 DIAGNOSIS — E119 Type 2 diabetes mellitus without complications: Secondary | ICD-10-CM

## 2013-08-25 DIAGNOSIS — D696 Thrombocytopenia, unspecified: Secondary | ICD-10-CM | POA: Diagnosis present

## 2013-08-25 DIAGNOSIS — Z9181 History of falling: Secondary | ICD-10-CM

## 2013-08-25 DIAGNOSIS — Z951 Presence of aortocoronary bypass graft: Secondary | ICD-10-CM

## 2013-08-25 DIAGNOSIS — N189 Chronic kidney disease, unspecified: Secondary | ICD-10-CM

## 2013-08-25 DIAGNOSIS — N039 Chronic nephritic syndrome with unspecified morphologic changes: Secondary | ICD-10-CM

## 2013-08-25 DIAGNOSIS — I319 Disease of pericardium, unspecified: Secondary | ICD-10-CM

## 2013-08-25 DIAGNOSIS — I10 Essential (primary) hypertension: Secondary | ICD-10-CM

## 2013-08-25 LAB — CBC
HCT: 25.2 % — ABNORMAL LOW (ref 36.0–46.0)
Hemoglobin: 7.8 g/dL — ABNORMAL LOW (ref 12.0–15.0)
MCH: 27.9 pg (ref 26.0–34.0)
MCHC: 31 g/dL (ref 30.0–36.0)
MCV: 90 fL (ref 78.0–100.0)
PLATELETS: 134 10*3/uL — AB (ref 150–400)
RBC: 2.8 MIL/uL — AB (ref 3.87–5.11)
RDW: 13.5 % (ref 11.5–15.5)
WBC: 4.1 10*3/uL (ref 4.0–10.5)

## 2013-08-25 LAB — GLUCOSE, CAPILLARY
GLUCOSE-CAPILLARY: 150 mg/dL — AB (ref 70–99)
GLUCOSE-CAPILLARY: 218 mg/dL — AB (ref 70–99)
Glucose-Capillary: 116 mg/dL — ABNORMAL HIGH (ref 70–99)
Glucose-Capillary: 182 mg/dL — ABNORMAL HIGH (ref 70–99)

## 2013-08-25 LAB — BASIC METABOLIC PANEL
BUN: 86 mg/dL — ABNORMAL HIGH (ref 6–23)
CALCIUM: 8.3 mg/dL — AB (ref 8.4–10.5)
CO2: 23 mEq/L (ref 19–32)
Chloride: 97 mEq/L (ref 96–112)
Creatinine, Ser: 6.49 mg/dL — ABNORMAL HIGH (ref 0.50–1.10)
GFR calc Af Amer: 6 mL/min — ABNORMAL LOW (ref >90)
GFR, EST NON AFRICAN AMERICAN: 5 mL/min — AB (ref >90)
Glucose, Bld: 118 mg/dL — ABNORMAL HIGH (ref 70–99)
Potassium: 5 mEq/L (ref 3.7–5.3)
SODIUM: 138 meq/L (ref 137–147)

## 2013-08-25 LAB — HEMOGLOBIN A1C
Hgb A1c MFr Bld: 6.7 % — ABNORMAL HIGH (ref <5.7)
Mean Plasma Glucose: 146 mg/dL — ABNORMAL HIGH (ref <117)

## 2013-08-25 LAB — PARATHYROID HORMONE, INTACT (NO CA): PTH: 355.9 pg/mL — ABNORMAL HIGH (ref 14.0–72.0)

## 2013-08-25 MED ORDER — INSULIN ASPART 100 UNIT/ML ~~LOC~~ SOLN
0.0000 [IU] | Freq: Three times a day (TID) | SUBCUTANEOUS | Status: DC
  Administered 2013-08-25: 1 [IU] via SUBCUTANEOUS
  Administered 2013-08-25 – 2013-08-27 (×4): 2 [IU] via SUBCUTANEOUS

## 2013-08-25 MED ORDER — FUROSEMIDE 10 MG/ML IJ SOLN
160.0000 mg | Freq: Two times a day (BID) | INTRAVENOUS | Status: DC
  Administered 2013-08-25: 160 mg via INTRAVENOUS
  Filled 2013-08-25 (×2): qty 16

## 2013-08-25 MED ORDER — POLYETHYLENE GLYCOL 3350 17 G PO PACK
17.0000 g | PACK | Freq: Every day | ORAL | Status: DC
  Administered 2013-08-25 – 2013-08-26 (×2): 17 g via ORAL
  Filled 2013-08-25 (×3): qty 1

## 2013-08-25 MED ORDER — CILOSTAZOL 100 MG PO TABS
ORAL_TABLET | ORAL | Status: AC
  Filled 2013-08-25: qty 1

## 2013-08-25 MED ORDER — BISACODYL 10 MG RE SUPP: 10.0000 mg | Freq: Every day | RECTAL | Status: DC | PRN

## 2013-08-25 MED ORDER — INSULIN ASPART 100 UNIT/ML ~~LOC~~ SOLN
0.0000 [IU] | Freq: Every day | SUBCUTANEOUS | Status: DC
  Administered 2013-08-25: 2 [IU] via SUBCUTANEOUS

## 2013-08-25 NOTE — Progress Notes (Signed)
UR chart review completed.  

## 2013-08-25 NOTE — Evaluation (Signed)
Physical Therapy Evaluation Patient Details Name: [name] MRN: [id number] DOB: 02/28/1931 Today's Date: 08/25/2013   History of Present Illness  80 year old female with history of chronic kidney disease stage IV (creatinine around 2 about 8 months back), hypertension, CAD with history of CABG, anemia of chronic disease, diabetes mellitus on oral hypoglycemic, peripheral neuropathy, arthritis and mild dementia who presented to the hospital with frequent falls for past 4-5 days. He should reports being generalized weakness for almost one week . She also reports poor appetite and drinking very little fluids. Patient lives alone and is independent with most of her ADLs. She reports cooking for herself whenever possible. Patient reports falling down 3 days back landing on her knees but denied standing any injury or loss of consciousness. She fell down 3 times since this morning and had difficulty getting up. She again did not sustain any injury or loss of consciousness. She reports she has been feeling increasingly sleepy for last 4-5 days.  She had increased BUN aqnd creatine at admission.  Clinical Impression  Pt lives alone normally uses a cane but has a walker available.  Pt will not be safe returning to home I recommend SNF.    Follow Up Recommendations SNF    Equipment Recommendations  None recommended by PT       Precautions / Restrictions Precautions Precautions: Fall Restrictions Weight Bearing Restrictions: No      Mobility  Bed Mobility Overal bed mobility: Modified Independent                Transfers Overall transfer level: Needs assistance   Transfers: Stand Pivot Transfers;Sit to/from Stand Sit to Stand: Min assist Stand pivot transfers: Min guard          Ambulation/Gait Ambulation/Gait assistance: Min guard Ambulation Distance (Feet): 5 Feet Assistive device: Rolling walker (2 wheeled) Gait Pattern/deviations: Decreased step length -  right;Decreased step length - left Gait velocity: LE shaky with ambulation Gait velocity interpretation: Below normal speed for age/gender              Pertinent Vitals/Pain None stated    Home Living Family/patient expects to be discharged to:: Skilled nursing facility Living Arrangements: Alone                    Prior Function Level of Independence: Independent with assistive device(s)               Hand Dominance   Dominant Hand: Right    Extremity/Trunk Assessment               Lower Extremity Assessment: Generalized weakness         Communication   Communication: No difficulties  Cognition Arousal/Alertness: Awake/alert   Overall Cognitive Status: Within Functional Limits for tasks assessed                         Exercises General Exercises - Lower Extremity Hip ABduction/ADduction: Standing;Both;10 reps Hip Flexion/Marching: Both;5 reps;Standing Mini-Sqauts: 10 reps      Assessment/Plan    PT Assessment Patient needs continued PT services  PT Diagnosis Difficulty walking;Generalized weakness   PT Problem List Decreased strength;Decreased mobility  PT Treatment Interventions Gait training;Therapeutic exercise   PT Goals (Current goals can be found in the Care Plan section)      Frequency Min 3X/week   Barriers to discharge  lives alone         End of Session Equipment Utilized  During Treatment: Gait belt Activity Tolerance: Patient tolerated treatment well Patient left: in bed;with call bell/phone within reach;with family/visitor present;with chair alarm set           Time: 1330-1350 PT Time Calculation (min): 20 min   Charges:   PT Evaluation $Initial PT Evaluation Tier I: 1 Procedure     PT G Codes:          Leeroy Cha 08/25/2013, 1:50 PM

## 2013-08-25 NOTE — Progress Notes (Signed)
[facility] PROGRESS NOTE  [name] [id number] DOB: Oct 30, 1930 DOA: 08/24/2013 PCP: Jani Gravel, MD    Code Status: Full code Family Communication: Family not available Disposition Plan: Discharge to home when clinically improved   Consultants:  Nephrology  Procedures:  None  Antibiotics:  None  HPI/Subjective: The patient is sitting up in bed. She denies nausea, vomiting, or abdominal pain. She has no chest pain or chest congestion.  Objective: Filed Vitals:   08/25/13 1418  BP: 138/72  Pulse: 76  Temp: 97.5 F (36.4 C)  Resp: 20    Intake/Output Summary (Last 24 hours) at 08/25/13 1832 Last data filed at 08/25/13 1500  Gross per 24 hour  Intake 1202.5 ml  Output    600 ml  Net  602.5 ml   Filed Weights   08/24/13 1114 08/24/13 1742  Weight: 81.647 kg (180 lb) 87.3 kg (192 lb 7.4 oz)    Exam:   General:  Alert elderly 80 year old African-American woman in no acute distress.  Cardiovascular: S1, S2, with a 2/6 systolic murmur and questionable rub.  Respiratory: Decreased breath sounds in the bases and clear anteriorly.  Abdomen: Positive bowel sounds, soft, nontender, nondistended.  Musculoskeletal/extremities: No acute hot joints. 2+ bilateral lower extremity edema.  Neurologic: She is alert and oriented x2. Cranial nerves II through XII are grossly intact. No asterixis.  Data Reviewed: Basic Metabolic Panel:  Recent Labs Lab 08/24/13 1156 08/25/13 0554  NA 135* 138  K 5.7* 5.0  CL 96 97  CO2 23 23  GLUCOSE 96 118*  BUN 85* 86*  CREATININE 6.87* 6.49*  CALCIUM 8.7 8.3*  MG 2.3  --    Liver Function Tests: No results found for this basename: AST, ALT, ALKPHOS, BILITOT, PROT, ALBUMIN,  in the last 168 hours No results found for this basename: LIPASE, AMYLASE,  in the last 168 hours No results found for this basename: AMMONIA,  in the last 168 hours CBC:  Recent Labs Lab 08/24/13 1156 08/25/13 0554  WBC 6.1 4.1   NEUTROABS 4.9  --   HGB 8.0* 7.8*  HCT 24.8* 25.2*  MCV 89.9 90.0  PLT 147* 134*   Cardiac Enzymes: No results found for this basename: CKTOTAL, CKMB, CKMBINDEX, TROPONINI,  in the last 168 hours BNP (last 3 results) No results found for this basename: PROBNP,  in the last 8760 hours CBG:  Recent Labs Lab 08/24/13 2057 08/25/13 0750 08/25/13 1119 08/25/13 1625  GLUCAP 151* 116* 150* 182*    No results found for this or any previous visit (from the past 240 hour(s)).   Studies: No results found.  Scheduled Meds: . amLODipine  10 mg Oral q morning - 10a  . atorvastatin  10 mg Oral q1800  . cefTRIAXone (ROCEPHIN)  IV  1 g Intravenous Q24H  . cilostazol  100 mg Oral BID  . donepezil  10 mg Oral QHS  . ferrous sulfate  325 mg Oral BID  . furosemide  160 mg Intravenous BID  . gabapentin  300 mg Oral QHS  . heparin  5,000 Units Subcutaneous 3 times per day  . insulin aspart  0-5 Units Subcutaneous QHS  . insulin aspart  0-9 Units Subcutaneous TID WC  . linagliptin  5 mg Oral q morning - 10a  . pioglitazone  30 mg Oral Daily  . polyethylene glycol  17 g Oral Daily  . sodium bicarbonate  650 mg Oral BID  . sodium chloride  3 mL Intravenous Q12H  .  Vitamin D (Ergocalciferol)  50,000 Units Oral Q7 days   Continuous Infusions:    Assessment/plan: Principal Problem:   Acute on chronic kidney disease, stage 4 Active Problems:   Anemia in chronic renal disease   Diabetes mellitus, type 2   HTN (hypertension)   Other and unspecified hyperlipidemia   Hx of CABG   Dementia   Frequent falls   Uremia   Hyperkalemia   Thrombocytopenia, unspecified  1. Continue IV Lasix and discontinuation of IV fluids per nephrology. Further recommendations regarding long-term management of her renal failure per nephrology. 2. We'll discontinue oral hypoglycemic agents in the setting of worsening renal function. We'll treat diabetes with sliding-scale NovoLog only. We'll order hemoglobin  A1c. 3. We'll order a 2-D echocardiogram to evaluate for possible rub and [id number] systolic heart murmur. 4. We'll discontinue Rocephin as there appears to be no evidence of a urinary tract infection. 5. Ferritin and TIBC pending. We'll order a vitamin B12 level to further assess for anemia. Will Hemoccult her stools. We'll consider transfusing packed red blood cells if her hemoglobin drops further. 6. We'll order TSH and vitamin B12 to assess for thrombocytopenia. 7. Physical therapy consult given history of falls at home.  Time spent: 35 minutes.     Rexene Alberts  [facility] Pager [phone]. If 7PM-7AM, please contact night-coverage at [email or web address], password [facility] 08/25/2013, 6:32 PM  LOS: 1 day

## 2013-08-25 NOTE — Clinical Social Work Placement (Signed)
Clinical Social Work Department CLINICAL SOCIAL WORK PLACEMENT NOTE 08/25/2013  Patient:  RITAMARIE, HAUGER  Account Number:  [id number] Admit date:  08/24/2013  Clinical Social Worker:  Benay Pike, LCSW  Date/time:  08/25/2013 03:02 PM  Clinical Social Work is seeking post-discharge placement for this patient at the following level of care:   SKILLED NURSING   (*CSW will update this form in Epic as items are completed)   08/25/2013  Patient/family provided with [place] Department of Clinical Social Work's list of facilities offering this level of care within the geographic area requested by the patient (or if unable, by the patient's family).  08/25/2013  Patient/family informed of their freedom to choose among providers that offer the needed level of care, that participate in Medicare, Medicaid or managed care program needed by the patient, have an available bed and are willing to accept the patient.  08/25/2013  Patient/family informed of [facility]' ownership interest in [facility], as well as of the fact that they are under no obligation to receive care at this facility.  PASARR submitted to [facility] on  PASARR number received from [place] on   FL2 transmitted to all facilities in geographic area requested by pt/family on  08/25/2013 FL2 transmitted to all facilities within larger geographic area on   Patient informed that his/her managed care company has contracts with or will negotiate with  certain facilities, including the following:     Patient/family informed of bed offers received:   Patient chooses bed at  Physician recommends and patient chooses bed at    Patient to be transferred to  on   Patient to be transferred to facility by   The following physician request were entered in Epic:   Additional Comments: Pt has existing pasarr.  Benay Pike, [place]

## 2013-08-25 NOTE — Progress Notes (Addendum)
Subjective: Interval History: has no complaint of difficulty in breathing. Patient appetite is okay. She doesn't have any nausea vomiting. She complains of some weakness..  Objective: Vital signs in last 24 hours: Temp:  [97.7 F (36.5 C)-98.6 F (37 C)] 97.7 F (36.5 C) (05/28 0524) Pulse Rate:  [44-144] 60 (05/28 0725) Resp:  [9-21] 20 (05/28 0524) BP: (101-168)/(51-80) 163/75 mmHg (05/28 0524) SpO2:  [94 %-99 %] 95 % (05/28 0524) Weight:  [87.3 kg (192 lb 7.4 oz)] 87.3 kg (192 lb 7.4 oz) (05/27 1742) Weight change:   Intake/Output from previous day: 05/27 0701 - 05/28 0700 In: 1202.5 [P.O.:290; I.V.:862.5; IV Piggyback:50] Out: 200 [Urine:200] Intake/Output this shift: Total I/O In: 120 [P.O.:120] Out: 300 [Urine:300]  Generally she alert in no apparent distress Chest decrease breath sound and inspiratory whezzing Heart RRR +M Abdomen +bowl sound Extremities 2+ edema  Lab Results:  Recent Labs  08/24/13 1156 08/25/13 0554  WBC 6.1 4.1  HGB 8.0* 7.8*  HCT 24.8* 25.2*  PLT 147* 134*   BMET:  Recent Labs  08/24/13 1156 08/25/13 0554  NA 135* 138  K 5.7* 5.0  CL 96 97  CO2 23 23  GLUCOSE 96 118*  BUN 85* 86*  CREATININE 6.87* 6.49*  CALCIUM 8.7 8.3*   No results found for this basename: PTH,  in the last 72 hours Iron Studies: No results found for this basename: IRON, TIBC, TRANSFERRIN, FERRITIN,  in the last 72 hours  Studies/Results: No results found.  I have reviewed the patient's current medications.  Assessment/Plan: Problem #1 acute kidney injury superimposed on chronic. Her BUN is 86 creatinine 6.49 renal function seems to be improving. Problem #2 hyperkalemia potassium has improved Problem #3 anemia: Possibly secondary to chronic renal failure however anemia of iron deficiency cannot ruled out. Problem #4 CHF: Patient is still with significant edema. Problem #5 history of hypertension: Her blood pressure is reasonably controlled Problem #6  diabetes Problem #7 coronary artery disease status post CABG presently patient doesn't have any chest pain.  Plan: We'll DC IV fluid Iron studies in the morning. We'll use Lasix 160 mg IV twice a day We'll check her basic metabolic panel in the morning including her phosphorus.    LOS: 1 day   Duston Smolenski S Earnest Thalman 08/25/2013,11:48 AM

## 2013-08-25 NOTE — Clinical Social Work Psychosocial (Signed)
Clinical Social Work Department BRIEF PSYCHOSOCIAL ASSESSMENT 08/25/2013  Patient:  [name], [name]     Account Number:  [id number]     Admit date:  08/24/2013  Clinical Social Worker:  Wyatt Haste  Date/Time:  08/25/2013 03:04 PM  Referred by:  CSW  Date Referred:  08/25/2013 Referred for  SNF Placement   Other Referral:   Interview type:  Patient Other interview type:    PSYCHOSOCIAL DATA Living Status:  ALONE Admitted from facility:   Level of care:   Primary support name:  Charlotte/Sandra Primary support relationship to patient:  FAMILY Degree of support available:   supportive per pt    CURRENT CONCERNS Current Concerns  Post-Acute Placement   Other Concerns:    SOCIAL WORK ASSESSMENT / PLAN CSW met with pt at bedside. Pt alert and oriented and reports she lives alone. She has a sister, Katharine Look who lives in [place]. Pt's friend, Baldo Ash lives here. Pt describes them as her best support. She came to hospital due to several falls. Pt reports she typically does well at home by herself. She uses a walker outside of the home, and sometimes furniture walks at home. Pt was placed at [facility] last fall and stayed one month. She states since that time she has not driven, but relies on friends to take her where she needs to go. PT evaluated pt today and recommendation is for SNF. CSW discussed placement process and provided SNF list. She states this is the best option for her right now since she does not have anyone at home to help her. Requests [facility] if possible.   Assessment/plan status:  Psychosocial Support/Ongoing Assessment of Needs Other assessment/ plan:   Information/referral to community resources:   SNF list    PATIENT'S/FAMILY'S RESPONSE TO PLAN OF CARE: Pt agreeable to ST SNF for rehab. CSW will initiate bed search and follow up with bed offers when available.       Benay Pike, [place]

## 2013-08-25 NOTE — Progress Notes (Signed)
  Echocardiogram 2D Echocardiogram has been performed.  [name] 08/25/2013, 3:43 PM

## 2013-08-26 LAB — PHOSPHORUS: Phosphorus: 6.4 mg/dL — ABNORMAL HIGH (ref 2.3–4.6)

## 2013-08-26 LAB — BASIC METABOLIC PANEL
BUN: 84 mg/dL — AB (ref 6–23)
CHLORIDE: 100 meq/L (ref 96–112)
CO2: 23 mEq/L (ref 19–32)
Calcium: 8.5 mg/dL (ref 8.4–10.5)
Creatinine, Ser: 6.45 mg/dL — ABNORMAL HIGH (ref 0.50–1.10)
GFR calc non Af Amer: 5 mL/min — ABNORMAL LOW (ref >90)
GFR, EST AFRICAN AMERICAN: 6 mL/min — AB (ref >90)
Glucose, Bld: 125 mg/dL — ABNORMAL HIGH (ref 70–99)
POTASSIUM: 4.7 meq/L (ref 3.7–5.3)
Sodium: 140 mEq/L (ref 137–147)

## 2013-08-26 LAB — GLUCOSE, CAPILLARY
GLUCOSE-CAPILLARY: 114 mg/dL — AB (ref 70–99)
GLUCOSE-CAPILLARY: 161 mg/dL — AB (ref 70–99)
Glucose-Capillary: 195 mg/dL — ABNORMAL HIGH (ref 70–99)
Glucose-Capillary: 175 mg/dL — ABNORMAL HIGH (ref 70–99)

## 2013-08-26 LAB — CBC
HEMATOCRIT: 23.4 % — AB (ref 36.0–46.0)
HEMOGLOBIN: 7.2 g/dL — AB (ref 12.0–15.0)
MCH: 27.7 pg (ref 26.0–34.0)
MCHC: 30.8 g/dL (ref 30.0–36.0)
MCV: 90 fL (ref 78.0–100.0)
Platelets: 131 10*3/uL — ABNORMAL LOW (ref 150–400)
RBC: 2.6 MIL/uL — ABNORMAL LOW (ref 3.87–5.11)
RDW: 13.5 % (ref 11.5–15.5)
WBC: 4.2 10*3/uL (ref 4.0–10.5)

## 2013-08-26 LAB — IRON AND TIBC
Iron: 31 ug/dL — ABNORMAL LOW (ref 42–135)
Saturation Ratios: 13 % — ABNORMAL LOW (ref 20–55)
TIBC: 242 ug/dL — ABNORMAL LOW (ref 250–470)
UIBC: 211 ug/dL (ref 125–400)

## 2013-08-26 LAB — FERRITIN: Ferritin: 303 ng/mL — ABNORMAL HIGH (ref 10–291)

## 2013-08-26 LAB — HEMOGLOBIN A1C
Hgb A1c MFr Bld: 6.4 % — ABNORMAL HIGH (ref <5.7)
MEAN PLASMA GLUCOSE: 137 mg/dL — AB (ref <117)

## 2013-08-26 LAB — VITAMIN B12: VITAMIN B 12: 361 pg/mL (ref 211–911)

## 2013-08-26 LAB — TSH: TSH: 1.03 u[IU]/mL (ref 0.350–4.500)

## 2013-08-26 MED ORDER — TORSEMIDE 20 MG PO TABS
50.0000 mg | ORAL_TABLET | Freq: Every day | ORAL | Status: DC
  Administered 2013-08-26 – 2013-08-27 (×2): 50 mg via ORAL
  Filled 2013-08-26 (×2): qty 3

## 2013-08-26 MED ORDER — SEVELAMER CARBONATE 800 MG PO TABS
800.0000 mg | ORAL_TABLET | Freq: Three times a day (TID) | ORAL | Status: DC
  Administered 2013-08-26 – 2013-08-27 (×4): 800 mg via ORAL
  Filled 2013-08-26 (×4): qty 1

## 2013-08-26 MED ORDER — CALCITRIOL 0.25 MCG PO CAPS
0.2500 ug | ORAL_CAPSULE | Freq: Every day | ORAL | Status: DC
  Administered 2013-08-26 – 2013-08-27 (×2): 0.25 ug via ORAL
  Filled 2013-08-26 (×2): qty 1

## 2013-08-26 MED ORDER — LINAGLIPTIN 5 MG PO TABS
5.0000 mg | ORAL_TABLET | Freq: Every morning | ORAL | Status: DC
  Administered 2013-08-27: 5 mg via ORAL
  Filled 2013-08-26: qty 1

## 2013-08-26 NOTE — Care Management Note (Signed)
    Page 1 of 1   08/26/2013     1:22:04 PM CARE MANAGEMENT NOTE 08/26/2013  Patient:  [name], [name]   Account Number:  [id number]  Date Initiated:  08/26/2013  Documentation initiated by:  Theophilus Kinds  Subjective/Objective Assessment:   Pt admitted from home with renal failure. Pt lives alone and has a nephew that lives out of town and is very active in the care of the pt.     Action/Plan:   PT recommends SNF at discharge and pt is agreeable. CSW will arrange discharge to facility when medically stable.   Anticipated DC Date:  08/29/2013   Anticipated DC Plan:  SKILLED NURSING FACILITY  In-house referral  Clinical Social Worker      DC Planning Services  CM consult      Choice offered to / List presented to:             Status of service:  Completed, signed off Medicare Important Message given?  YES (If response is "NO", the following Medicare IM given date fields will be blank) Date Medicare IM given:  08/26/2013 Date Additional Medicare IM given:    Discharge Disposition:  [place]  Per UR Regulation:    If discussed at Long Length of Stay Meetings, dates discussed:    Comments:  08/26/13 [place], RN BSN CM

## 2013-08-26 NOTE — Clinical Social Work Note (Addendum)
CSW presented bed offers and pt accepts [facility]. Facility notified. Pt reports she discussed rehab with her family yesterday and they are also supportive of this decision. Awaiting stability for d/c. Possible d/c this weekend. Will notify [facility]. CSW to continue to follow.  Benay Pike, [place]

## 2013-08-26 NOTE — Progress Notes (Signed)
Shift Summary 7p-7a:  Pt remained alert and oriented. Pt rested well throughout the shift.  Pt assisted to BR as needed.  Safety/falls protocol maintained, no injuries this shift. No complaints of pain. Pt had an episode of tachycardia while bathing/ambulating; around 2340 pt had a rhythm change from ST/SR to Afib, pt remained symptomatic, 12 lead EKG completed. Tolerating PO intake well. Fingerstick blood sugars monitored and managed. PIV remains patent with no s/s of phlebitis or infiltration. No changes noted in skin integrity. Occult blood still pending, as pt did not have a BM this shift. No concerns at this time, will continue plan of care.   Danella Deis, RN 08/26/2013 6:01 AM

## 2013-08-26 NOTE — Progress Notes (Signed)
[facility] PROGRESS NOTE  [name] [id number] DOB: 04-May-1930 DOA: 08/24/2013 PCP: Jani Gravel, MD Assessment/plan:  Acute on chronic kidney disease, stage 4  -Appreciate renal assistance, no signs of uremia at this time, per Dr. Lowanda Foster indications for emergent dialysis at this time -Creatinine stable/continuing downward trend -Lasix changed to Demadex per renal, follow -Dr. Lowanda Foster to follow and advise when okay to DC to rehabilitation Active Problems:  Uncontrolled Diabetes mellitus, type 2  -Resume Trajenta, continue sliding scale insulin. A1c 6.7 -Continue holding glipizide HTN (hypertension)  -Good blood pressure control, continue Norvasc Other and unspecified hyperlipidemia  -Continue statin Hx of CABG/CAD -She is chest pain free, continue outpatient medications  Dementia  -Continue Aricept Frequent falls  -PT OT following, to SNF when medically ready -TSH within normal limits Hyperkalemia  -Resolved Thrombocytopenia, unspecified -Vitamin B12 and TSH within normal limits -No gross bleeding Anemia in chronic renal disease -Hemoglobin stable-7.2 no gross bleeding follow and transfuse as appropriate  -Continue supplemental iron, ? Needs aranesp-Per renal        Code Status: Full code Family Communication: Family not available Disposition Plan: Discharge to home when clinically improved   Consultants:  Nephrology  Procedures:  None  Antibiotics:  None  HPI/Subjective: pt sitting up in chair. She denies nausea, vomiting, or abdominal pain. Denies chest pain and no shortness of breath  Objective: Filed Vitals:   08/26/13 0221  BP: 121/68  Pulse: 65  Temp: 97.7 F (36.5 C)  Resp: 16    Intake/Output Summary (Last 24 hours) at 08/26/13 1232 Last data filed at 08/26/13 1005  Gross per 24 hour  Intake    769 ml  Output   1300 ml  Net   -531 ml   Filed Weights   08/24/13 1114 08/24/13 1742  Weight: 81.647 kg (180 lb) 87.3  kg (192 lb 7.4 oz)    Exam:   General:  Alert elderly African-American female in no acute distress, answers appropriately.  Cardiovascular: S1, S2, with a 2/6 systolic murmur and questionable rub.  Respiratory: Decreased breath sounds in the bases and clear anteriorly.  Abdomen: Positive bowel sounds, soft, nontender, nondistended.  Musculoskeletal/extremities: No acute hot joints. 1+ bilateral lower extremity edema.  Neurologic: She is alert and oriented x2. Cranial nerves II through XII are grossly intact. No asterixis.  Data Reviewed: Basic Metabolic Panel:  Recent Labs Lab 08/24/13 1156 08/25/13 0554 08/26/13 0521  NA 135* 138 140  K 5.7* 5.0 4.7  CL 96 97 100  CO2 23 23 23   GLUCOSE 96 118* 125*  BUN 85* 86* 84*  CREATININE 6.87* 6.49* 6.45*  CALCIUM 8.7 8.3* 8.5  MG 2.3  --   --   PHOS  --   --  6.4*   Liver Function Tests: No results found for this basename: AST, ALT, ALKPHOS, BILITOT, PROT, ALBUMIN,  in the last 168 hours No results found for this basename: LIPASE, AMYLASE,  in the last 168 hours No results found for this basename: AMMONIA,  in the last 168 hours CBC:  Recent Labs Lab 08/24/13 1156 08/25/13 0554 08/26/13 0521  WBC 6.1 4.1 4.2  NEUTROABS 4.9  --   --   HGB 8.0* 7.8* 7.2*  HCT 24.8* 25.2* 23.4*  MCV 89.9 90.0 90.0  PLT 147* 134* 131*   Cardiac Enzymes: No results found for this basename: CKTOTAL, CKMB, CKMBINDEX, TROPONINI,  in the last 168 hours BNP (last 3 results) No results found for this basename: PROBNP,  in the last 8760 hours CBG:  Recent Labs Lab 08/25/13 1119 08/25/13 1625 08/25/13 2024 08/26/13 0732 08/26/13 1122  GLUCAP 150* 182* 218* 114* 195*    No results found for this or any previous visit (from the past 240 hour(s)).   Studies: No results found.  Scheduled Meds: . amLODipine  10 mg Oral q morning - 10a  . atorvastatin  10 mg Oral q1800  . calcitRIOL  0.25 mcg Oral Daily  . cilostazol  100 mg Oral  BID  . donepezil  10 mg Oral QHS  . ferrous sulfate  325 mg Oral BID  . gabapentin  300 mg Oral QHS  . heparin  5,000 Units Subcutaneous 3 times per day  . insulin aspart  0-5 Units Subcutaneous QHS  . insulin aspart  0-9 Units Subcutaneous TID WC  . polyethylene glycol  17 g Oral Daily  . sevelamer carbonate  800 mg Oral TID WC  . sodium bicarbonate  650 mg Oral BID  . sodium chloride  3 mL Intravenous Q12H  . torsemide  50 mg Oral Daily  . Vitamin D (Ergocalciferol)  50,000 Units Oral Q7 days   Continuous Infusions:     Principal Problem:   Acute on chronic kidney disease, stage 4 Active Problems:   Anemia in chronic renal disease   Diabetes mellitus, type 2   HTN (hypertension)   Other and unspecified hyperlipidemia   Hx of CABG   Dementia   Frequent falls   Uremia   Hyperkalemia   Thrombocytopenia, unspecified   Time spent: 25 minutes.     [facility] Pager [phone]. If 7PM-7AM, please contact night-coverage at [email or web address], password [facility] 08/26/2013, 12:32 PM  LOS: 2 days

## 2013-08-26 NOTE — Progress Notes (Signed)
Subjective: Interval History: Patient offers no complaint. Claims to be much better and no difficulty in breathing. She does not have any nausea or vomiting.  Objective: Vital signs in last 24 hours: Temp:  [97.4 F (36.3 C)-97.7 F (36.5 C)] 97.7 F (36.5 C) (05/29 0221) Pulse Rate:  [65-108] 65 (05/29 0221) Resp:  [16-20] 16 (05/29 0221) BP: (121-156)/(63-79) 121/68 mmHg (05/29 0221) SpO2:  [96 %-98 %] 98 % (05/29 0221) Weight change:   Intake/Output from previous day: 05/28 0701 - 05/29 0700 In: 646 [P.O.:580; IV Piggyback:66] Out: 1100 [Urine:1100] Intake/Output this shift:    Generally she alert in no apparent distress Chest decrease breath sound and inspiratory whezzing Heart RRR +M Abdomen +bowl sound Extremities 1+ edema  Lab Results:  Recent Labs  08/25/13 0554 08/26/13 0521  WBC 4.1 4.2  HGB 7.8* 7.2*  HCT 25.2* 23.4*  PLT 134* 131*   BMET:   Recent Labs  08/25/13 0554 08/26/13 0521  NA 138 140  K 5.0 4.7  CL 97 100  CO2 23 23  GLUCOSE 118* 125*  BUN 86* 84*  CREATININE 6.49* 6.45*  CALCIUM 8.3* 8.5    Recent Labs  08/24/13 1156  PTH 355.9*   Iron Studies: No results found for this basename: IRON, TIBC, TRANSFERRIN, FERRITIN,  in the last 72 hours  Studies/Results: No results found.  I have reviewed the patient's current medications.  Assessment/Plan: Problem #1 acute kidney injury superimposed on chronic. Her BUN is 84 creatinine 6.45 renal function is stable . She does not have uremic sign and symptoms. Problem #2 hyperkalemia potassium has corrected Problem #3 anemia: Possibly secondary to chronic renal failure however anemia of iron deficiency cannot ruled out. Problem #4 CHF: Patient is on lasix and none oliguric and feeling much better Problem #5 history of hypertension: Her blood pressure is reasonably controlled Problem #6 diabetes Problem #7 coronary artery disease status post CABG presently patient doesn't have any chest  pain.  Problem#8 Metabolic bone disease: Her calcium is in range but her phosphorus and PTH is high Plan: We'll start patient on Renvela 800 mg po tid with meals and 1 with snack Rocalrol .25 microgram po once a day We'll d/c lasix and start on Demadex 50 mg po once a day We'll check her basic metabolic panel in the morning including her phosphorus.    LOS: 2 days   [name] S [name] 08/26/2013,7:53 AM

## 2013-08-26 NOTE — Clinical Social Work Placement (Signed)
Clinical Social Work Department CLINICAL SOCIAL WORK PLACEMENT NOTE 08/26/2013  Patient:  [name], [name]  Account Number:  [id number] Admit date:  08/24/2013  Clinical Social Worker:  Benay Pike, LCSW  Date/time:  08/25/2013 03:02 PM  Clinical Social Work is seeking post-discharge placement for this patient at the following level of care:   SKILLED NURSING   (*CSW will update this form in Epic as items are completed)   08/25/2013  Patient/family provided with [facility] Department of Clinical Social Work's list of facilities offering this level of care within the geographic area requested by the patient (or if unable, by the patient's family).  08/25/2013  Patient/family informed of their freedom to choose among providers that offer the needed level of care, that participate in Medicare, Medicaid or managed care program needed by the patient, have an available bed and are willing to accept the patient.  08/25/2013  Patient/family informed of [facility]' ownership interest in [facility], as well as of the fact that they are under no obligation to receive care at this facility.  PASARR submitted to [facility] on  PASARR number received from [facility] on   FL2 transmitted to all facilities in geographic area requested by pt/family on  08/25/2013 FL2 transmitted to all facilities within larger geographic area on   Patient informed that his/her managed care company has contracts with or will negotiate with  certain facilities, including the following:     Patient/family informed of bed offers received:  08/26/2013 Patient chooses bed at [facility] Physician recommends and patient chooses bed at  [facility]  Patient to be transferred to  on   Patient to be transferred to facility by   The following physician request were entered in Epic:   Additional Comments: Pt has existing pasarr.  Benay Pike, [place]

## 2013-08-27 ENCOUNTER — Inpatient Hospital Stay
Admission: RE | Admit: 2013-08-27 | Discharge: 2013-09-05 | Disposition: A | Discharge status: Home / Self Care | Payer: Medicare Other | Source: Ambulatory Visit | Attending: Internal Medicine | Admitting: Internal Medicine

## 2013-08-27 DIAGNOSIS — F0390 Unspecified dementia without behavioral disturbance: Secondary | ICD-10-CM

## 2013-08-27 LAB — BASIC METABOLIC PANEL
BUN: 82 mg/dL — ABNORMAL HIGH (ref 6–23)
CHLORIDE: 99 meq/L (ref 96–112)
CO2: 25 meq/L (ref 19–32)
Calcium: 8.6 mg/dL (ref 8.4–10.5)
Creatinine, Ser: 6.37 mg/dL — ABNORMAL HIGH (ref 0.50–1.10)
GFR calc Af Amer: 6 mL/min — ABNORMAL LOW (ref >90)
GFR, EST NON AFRICAN AMERICAN: 6 mL/min — AB (ref >90)
GLUCOSE: 119 mg/dL — AB (ref 70–99)
POTASSIUM: 4.6 meq/L (ref 3.7–5.3)
SODIUM: 138 meq/L (ref 137–147)

## 2013-08-27 LAB — GLUCOSE, CAPILLARY
Glucose-Capillary: 117 mg/dL — ABNORMAL HIGH (ref 70–99)
Glucose-Capillary: 197 mg/dL — ABNORMAL HIGH (ref 70–99)

## 2013-08-27 LAB — HEMOGLOBIN AND HEMATOCRIT, BLOOD
HCT: 26.3 % — ABNORMAL LOW (ref 36.0–46.0)
HEMOGLOBIN: 8.6 g/dL — AB (ref 12.0–15.0)

## 2013-08-27 MED ORDER — ACETAMINOPHEN-CODEINE #3 300-30 MG PO TABS: 1.0000 | ORAL_TABLET | Freq: Three times a day (TID) | ORAL | Status: DC | PRN

## 2013-08-27 MED ORDER — CALCITRIOL 0.25 MCG PO CAPS: 0.2500 ug | ORAL_CAPSULE | Freq: Every day | ORAL | Status: DC

## 2013-08-27 MED ORDER — DARBEPOETIN ALFA-POLYSORBATE 60 MCG/0.3ML IJ SOLN
60.0000 ug | INTRAMUSCULAR | Status: DC
  Filled 2013-08-27: qty 0.3

## 2013-08-27 MED ORDER — TORSEMIDE 10 MG PO TABS: 50.0000 mg | ORAL_TABLET | Freq: Every day | ORAL | Status: DC

## 2013-08-27 MED ORDER — SEVELAMER CARBONATE 800 MG PO TABS: 800.0000 mg | ORAL_TABLET | Freq: Three times a day (TID) | ORAL | Status: DC

## 2013-08-27 MED ORDER — POLYSACCHARIDE IRON COMPLEX 150 MG PO CAPS
150.0000 mg | ORAL_CAPSULE | Freq: Two times a day (BID) | ORAL | Status: DC
  Administered 2013-08-27: 150 mg via ORAL
  Filled 2013-08-27: qty 1

## 2013-08-27 NOTE — Progress Notes (Signed)
Reported to Zenovia Jordan, LPN at [facility].

## 2013-08-27 NOTE — Discharge Summary (Signed)
Physician Discharge Summary  [name] [id number] DOB: Nov 21, 1930 DOA: 08/24/2013  PCP: Jani Gravel, MD  Admit date: 08/24/2013 Discharge date: 08/27/2013  Time spent: 45 minutes  Recommendations for Outpatient Follow-up:  1. Discharge to [facility].  2. Check BMET and CBC on 08/30/13 with results sent to Dr. Lowanda Foster   Discharge Diagnoses:  Principal Problem:   Acute on chronic kidney disease, stage 4 Active Problems:   Anemia in chronic renal disease   Diabetes mellitus, type 2   HTN (hypertension)   Other and unspecified hyperlipidemia   Hx of CABG   Dementia   Frequent falls   Uremia   Hyperkalemia   Thrombocytopenia, unspecified   Discharge Condition: improved  Diet recommendation: low salt, low carb  Filed Weights   08/24/13 1114 08/24/13 1742  Weight: 81.647 kg (180 lb) 87.3 kg (192 lb 7.4 oz)    History of present illness:  80 year old female with history of chronic kidney disease stage IV (creatinine around 2 about 8 months back), hypertension, CAD with history of CABG, anemia of chronic disease, diabetes mellitus on oral hypoglycemic, peripheral neuropathy, arthritis and mild dementia who presented to the hospital with frequent falls for past 4-5 days. He should reports being generalized weakness for almost one week . She also reports poor appetite and drinking very little fluids. Patient lives alone and is independent with most of her ADLs. She reports cooking for herself whenever possible. Patient reports falling down 3 days back landing on her knees but denied standing any injury or loss of consciousness. She fell down 3 times since this morning and had difficulty getting up. She again did not sustain any injury or loss of consciousness. She reports she has been feeling increasingly sleepy for last 4-5 days.  Patient denies headache, dizziness, fever, chills, nausea , vomiting, chest pain, palpitations, SOB, abdominal pain, bowel or urinary  symptoms. Denies change in weight . She denies any changes in her medications. She reports her blood glucose to be stable (checks occasionally). He denies any recent illness or sick contacts. Patient reports taking ibuprofen off and on for pain.  As per the ED physician she visited her nephrologist yesterday and was found to have elevated BUN and creatinine during routine labs as well and her nephrologist at all the ED today have her admitted.  Course in the ED  Patient's vitals were stable. Blood work showed hemoglobin of 8 (at baseline) hematocrit of 24.8, platelets of 147. Chemistry showed sodium of 135, potassium of 5.7, chloride of 96, CO2 of 23, BUN of 85 and creatinine of 6.87. UA was suggestive of UTI.  Given findings of acute kidney injury and hypokalemia prior hospitalists called for admission to telemetry.   Hospital Course:  This patient was admitted to the hospital with acute on chronic renal failure. She was having generalized weakness and frequent falls. She was seen by nephrology and started on intravenous fluids as well as diuretics. This was transitioned to oral Demadex. Her renal function is slowly improving. Creatinine is still markedly elevated at 6.3, but is trending down. It is felt that she may likely need dialysis in the near future. She'll be followed by nephrology in the next one week with repeat labs on 08/30/13. If needed, arrangements for AV fistula will be made as an outpatient.  The patient was also noted to have significant anemia, this is felt be related to her renal disease. Hemoglobin on discharge is 8.6. She does not have any evidence of  bleeding. She did receive 1 dose of Aranesp in the hospital. Blood sugars have been stable, her prednisone has been discontinued due to her renal failure. She's currently on sliding scale insulin and tradjenta. This was to be further adjusted in the outpatient setting. She'll be discharged to skilled nursing facility for further  treatments.  Procedures:  Echo: - Moderate LV basal septal hypertrophy with LVEF 0000000, grade 2 diastolic dysfunction. Moderate left atrial enlargement. MAC with mildly thickened mitral leaflets and moderate, eccentric mitral regurgitation. Very mild aortic stenosis. Moderate tricuspid regurgitation with PASP 54 mm mercury. Moderate, posterior pericardial effusion.   Consultations:  Nephrology  Discharge Exam: Filed Vitals:   08/27/13 0413  BP: 137/76  Pulse: 69  Temp: 97.2 F (36.2 C)  Resp: 20    General: NAD Cardiovascular: S1, S2 RRR, 1+ edema b/l Respiratory: CTA B  Discharge Instructions You were cared for by a hospitalist during your hospital stay. If you have any questions about your discharge medications or the care you received while you were in the hospital after you are discharged, you can call the unit and asked to speak with the hospitalist on call if the hospitalist that took care of you is not available. Once you are discharged, your primary care physician will handle any further medical issues. Please note that NO REFILLS for any discharge medications will be authorized once you are discharged, as it is imperative that you return to your primary care physician (or establish a relationship with a primary care physician if you do not have one) for your aftercare needs so that they can reassess your need for medications and monitor your lab values.  Discharge Instructions   Call MD for:  difficulty breathing, headache or visual disturbances    Complete by:  As directed      Diet - low sodium heart healthy    Complete by:  As directed      Increase activity slowly    Complete by:  As directed             Medication List    STOP taking these medications       enalapril 20 MG tablet  Commonly known as:  VASOTEC     glipiZIDE 5 MG 24 hr tablet  Commonly known as:  GLUCOTROL XL     pioglitazone 30 MG tablet  Commonly known as:  ACTOS      TAKE these  medications       acetaminophen-codeine 300-30 MG per tablet  Commonly known as:  TYLENOL #3  Take 1 tablet by mouth every 8 (eight) hours as needed for moderate pain.     amLODipine 10 MG tablet  Commonly known as:  NORVASC  Take 10 mg by mouth every morning. For BLOOD PRESSURE     calcitRIOL 0.25 MCG capsule  Commonly known as:  ROCALTROL  Take 1 capsule (0.25 mcg total) by mouth daily.     cilostazol 100 MG tablet  Commonly known as:  PLETAL  Take 100 mg by mouth 2 (two) times daily. For CIRCULATION or AS DIRECTED by your physician     donepezil 10 MG tablet  Commonly known as:  ARICEPT  Take 10 mg by mouth at bedtime. For MEMORY     ferrous sulfate 325 (65 FE) MG tablet  Take 325 mg by mouth 2 (two) times daily. As SUPPLEMENT     gabapentin 300 MG capsule  Commonly known as:  NEURONTIN  Take 300 mg by  mouth at bedtime. For NEUROPATHY/NERVE PAIN     linagliptin 5 MG Tabs tablet  Commonly known as:  TRADJENTA  Take 5 mg by mouth every morning. For BLOOD SUGARS/DIABETES     rosuvastatin 5 MG tablet  Commonly known as:  CRESTOR  Take 5 mg by mouth every morning. For CHOLESTEROL     sevelamer carbonate 800 MG tablet  Commonly known as:  RENVELA  Take 1 tablet (800 mg total) by mouth 3 (three) times daily with meals.     sodium bicarbonate 650 MG tablet  Take 650 mg by mouth 2 (two) times daily.     torsemide 10 MG tablet  Commonly known as:  DEMADEX  Take 5 tablets (50 mg total) by mouth daily.     Vitamin D (Ergocalciferol) 50000 UNITS Caps capsule  Commonly known as:  DRISDOL  Take 1 capsule (50,000 Units total) by mouth every 7 (seven) days.       No Known Allergies     Follow-up Information   Follow up with Jani Gravel, MD.   Specialty:  Internal Medicine   Contact information:   [address] [place] [address] [phone]        The results of significant diagnostics from this hospitalization (including imaging,  microbiology, ancillary and laboratory) are listed below for reference.    Significant Diagnostic Studies: No results found.  Microbiology: No results found for this or any previous visit (from the past 240 hour(s)).   Labs: Basic Metabolic Panel:  Recent Labs Lab 08/24/13 1156 08/25/13 0554 08/26/13 0521 08/27/13 0548  NA 135* 138 140 138  K 5.7* 5.0 4.7 4.6  CL 96 97 100 99  CO2 23 23 23 25   GLUCOSE 96 118* 125* 119*  BUN 85* 86* 84* 82*  CREATININE 6.87* 6.49* 6.45* 6.37*  CALCIUM 8.7 8.3* 8.5 8.6  MG 2.3  --   --   --   PHOS  --   --  6.4*  --    Liver Function Tests: No results found for this basename: AST, ALT, ALKPHOS, BILITOT, PROT, ALBUMIN,  in the last 168 hours No results found for this basename: LIPASE, AMYLASE,  in the last 168 hours No results found for this basename: AMMONIA,  in the last 168 hours CBC:  Recent Labs Lab 08/24/13 1156 08/25/13 0554 08/26/13 0521 08/27/13 1255  WBC 6.1 4.1 4.2  --   NEUTROABS 4.9  --   --   --   HGB 8.0* 7.8* 7.2* 8.6*  HCT 24.8* 25.2* 23.4* 26.3*  MCV 89.9 90.0 90.0  --   PLT 147* 134* 131*  --    Cardiac Enzymes: No results found for this basename: CKTOTAL, CKMB, CKMBINDEX, TROPONINI,  in the last 168 hours BNP: BNP (last 3 results) No results found for this basename: PROBNP,  in the last 8760 hours CBG:  Recent Labs Lab 08/26/13 1122 08/26/13 1712 08/26/13 2058 08/27/13 0739 08/27/13 1142  GLUCAP 195* 175* 161* 117* 197*       Signed:  Kaya Klausing  [facility] 08/27/2013, 1:30 PM

## 2013-08-27 NOTE — Discharge Instructions (Signed)
Chronic Kidney Disease Chronic kidney disease occurs when the kidneys are damaged over a long period. The kidneys are two organs that lie on either side of the spine between the middle of the back and the front of the abdomen. The kidneys:   Remove wastes and extra water from the blood.   Produce important hormones. These help keep bones strong, regulate blood pressure, and help create red blood cells.   Balance the fluids and chemicals in the blood and tissues. A small amount of kidney damage may not cause problems, but a large amount of damage may make it difficult or impossible for the kidneys to work the way they should. If steps are not taken to slow down the kidney damage or stop it from getting worse, the kidneys may stop working permanently. Most of the time, chronic kidney disease does not go away. However, it can often be controlled, and those with the disease can usually live normal lives. CAUSES  The most common causes of chronic kidney disease are diabetes and high blood pressure (hypertension). Chronic kidney disease may also be caused by:   Diseases that cause kidneys' filters to become inflamed.   Diseases that affect the immune system.   Genetic diseases.   Medicines that damage the kidneys, such as anti-inflammatory medicines.  Poisoning or exposure to toxic substances.   A reoccurring kidney or urinary infection.   A problem with urine flow. This may be caused by:   Cancer.   Kidney stones.   An enlarged prostate in males. SYMPTOMS  Because the kidney damage in chronic kidney disease occurs slowly, symptoms develop slowly and may not be obvious until the kidney damage becomes severe. A person may have a kidney disease for years without showing any symptoms. Symptoms can include:   Swelling (edema) of the legs, ankles, or feet.   Tiredness (lethargy).   Nausea or vomiting.   Confusion.   Problems with urination, such as:   Decreased urine  production.   Frequent urination, especially at night.   Frequent accidents in children who are potty trained.   Muscle twitches and cramps.   Shortness of breath.  Weakness.   Persistent itchiness.   Loss of appetite.  Metallic taste in the mouth.  Trouble sleeping.  Slowed development in children.  Short stature in children. DIAGNOSIS  Chronic kidney disease may be detected and diagnosed by tests, including blood, urine, imaging, or kidney biopsy tests.  TREATMENT  Most chronic kidney diseases cannot be cured. Treatment usually involves relieving symptoms and preventing or slowing the progression of the disease. Treatment may include:   A special diet. You may need to avoid alcohol and foods thatare salty and high in potassium.   Medicines. These may:   Lower blood pressure.   Relieve anemia.   Relieve swelling.   Protect the bones. HOME CARE INSTRUCTIONS   Follow your prescribed diet.   Only take over-the-counter or prescription medicines as directed by your caregiver.  Do not take any new medicines (prescription, over-the-counter, or nutritional supplements) unless approved by your caregiver. Many medicines can worsen your kidney damage or need to have the dose adjusted.   Quit smoking if you are a smoker. Talk to your caregiver about a smoking cessation program.   Keep all follow-up appointments as directed by your caregiver. SEEK IMMEDIATE MEDICAL CARE IF:  Your symptoms get worse or you develop new symptoms.   You develop symptoms of end-stage kidney disease. These include:   Headaches.  Abnormally dark or light skin.   Numbness in the hands or feet.   Easy bruising.   Frequent hiccups.   Menstruation stops.   You have a fever.   You have decreased urine production.   You havepain or bleeding when urinating. MAKE SURE YOU:  Understand these instructions.  Will watch your condition.  Will get help right  away if you are not doing well or get worse. FOR MORE INFORMATION  American Association of Kidney Patients: BombTimer.gl National Kidney Foundation: www.kidney.[place]: https://mathis.com/ [facility]: www.lifeoptions.org and www.kidneyschool.org Document Released: 12/25/2007 Document Revised: 03/03/2012 Document Reviewed: 11/14/2011 [facility] Patient Information 2014 [place], [place].

## 2013-08-27 NOTE — Progress Notes (Signed)
Subjective: Interval History: She states that she is feeling much better. Her appetite is good and no nausea or vomiting  Objective: Vital signs in last 24 hours: Temp:  [97.2 F (36.2 C)-98.2 F (36.8 C)] 97.2 F (36.2 C) (05/30 0413) Pulse Rate:  [69-80] 69 (05/30 0413) Resp:  [18-20] 20 (05/30 0413) BP: (110-137)/(69-79) 137/76 mmHg (05/30 0413) SpO2:  [96 %-98 %] 98 % (05/30 0413) Weight change:   Intake/Output from previous day: 05/29 0701 - 05/30 0700 In: 533 [P.O.:530; I.V.:3] Out: 1500 [Urine:1500] Intake/Output this shift: Total I/O In: 120 [P.O.:120] Out: -   Generally she alert in no apparent distress Chest decrease breath sound no rales or fhonchi Heart RRR  No s3 Abdomen +bowl sound Extremities Trace  edema  Lab Results:  Recent Labs  08/25/13 0554 08/26/13 0521  WBC 4.1 4.2  HGB 7.8* 7.2*  HCT 25.2* 23.4*  PLT 134* 131*   BMET:   Recent Labs  08/26/13 0521 08/27/13 0548  NA 140 138  K 4.7 4.6  CL 100 99  CO2 23 25  GLUCOSE 125* 119*  BUN 84* 82*  CREATININE 6.45* 6.37*  CALCIUM 8.5 8.6    Recent Labs  08/24/13 1156  PTH 355.9*   Iron Studies:   Recent Labs  08/26/13 0521  IRON 31*  TIBC 242*  FERRITIN 303*    Studies/Results: No results found.  I have reviewed the patient's current medications.  Assessment/Plan: Problem #1 acute kidney injury superimposed on chronic. Her BUN is 82 creatinine 6.37 renal function is stable  And no significant change. Patient is asymptomatic Problem #2 hyperkalemia: potassium is normal Problem #3 anemia: Possibly secondary to chronic renal failure . Her iron saturation is low but feraiin is high Problem #4 CHF: Patient is on lasix and none oliguric and feeling much better. She has 1500 cc of urine Problem #5 history of hypertension: Her blood pressure is reasonably controlled Problem #6 diabetes Problem #7 coronary artery disease status post CABG p  Problem#8 Metabolic bone disease: Her  calcium is in range but her phosphorus and PTH is high. Presently she is started on binder  Plan: Presently patient does not need dialysis but needs fistula  Placement which can be done as out patient if discharged. I will see her in a week and make decision . Change her iron to nu-iron 150 mg po bid Start patient on ARESAP 60 microgram sq per week We'll check her basic metabolic panel in the morning including her phosphorus.    LOS: 3 days   Tovah Slavick S Heru Montz 08/27/2013,8:46 AM

## 2013-08-27 NOTE — Progress Notes (Signed)
Discharge to [facility] in stable condition via w/c with staff.

## 2013-08-29 ENCOUNTER — Non-Acute Institutional Stay (SKILLED_NURSING_FACILITY): Payer: Medicare Other | Admitting: Internal Medicine

## 2013-08-29 DIAGNOSIS — E1149 Type 2 diabetes mellitus with other diabetic neurological complication: Secondary | ICD-10-CM

## 2013-08-29 DIAGNOSIS — E1129 Type 2 diabetes mellitus with other diabetic kidney complication: Secondary | ICD-10-CM

## 2013-08-29 DIAGNOSIS — I5033 Acute on chronic diastolic (congestive) heart failure: Secondary | ICD-10-CM

## 2013-08-29 DIAGNOSIS — E1165 Type 2 diabetes mellitus with hyperglycemia: Principal | ICD-10-CM

## 2013-08-29 DIAGNOSIS — N184 Chronic kidney disease, stage 4 (severe): Secondary | ICD-10-CM

## 2013-08-29 DIAGNOSIS — R269 Unspecified abnormalities of gait and mobility: Secondary | ICD-10-CM

## 2013-08-29 LAB — GLUCOSE, CAPILLARY
GLUCOSE-CAPILLARY: 187 mg/dL — AB (ref 70–99)
GLUCOSE-CAPILLARY: 164 mg/dL — AB (ref 70–99)
Glucose-Capillary: 145 mg/dL — ABNORMAL HIGH (ref 70–99)
Glucose-Capillary: 106 mg/dL — ABNORMAL HIGH (ref 70–99)
Glucose-Capillary: 164 mg/dL — ABNORMAL HIGH (ref 70–99)
Glucose-Capillary: 122 mg/dL — ABNORMAL HIGH (ref 70–99)

## 2013-08-30 LAB — GLUCOSE, CAPILLARY
Glucose-Capillary: 119 mg/dL — ABNORMAL HIGH (ref 70–99)
Glucose-Capillary: 129 mg/dL — ABNORMAL HIGH (ref 70–99)

## 2013-08-30 NOTE — Progress Notes (Addendum)
Patient ID: [name], female   DOB: [birth year], 80 y.o.   MRN: [id number]                  HISTORY & PHYSICAL  DATE:  08/29/2013    FACILITY: [facility]    LEVEL OF CARE:   SNF   HISTORY OF PRESENT ILLNESS:  This is an 80 year-old woman who was admitted to hospital from 08/24/2013 through 08/27/2013.    Her last creatinine appears to have been in October 2014, at which time this was 2.08.  The patient tells me she was in hospital at that point for frequent falls.    Once again, she was admitted on this occasion with frequent falls for 4-5 days and generalized weakness.   She states that she can fall at any point.  She simply does not have her balance.  She also describes orthostasis.  She says she has been seen by her ophthalmologist recently and is not aware of any changes that were made.  She does not lose consciousness.  She complains of right shoulder pain, but no other injuries.    She was admitted to hospital with a creatinine of 6.87, a BUN of 85.  A urinalysis was suggestive of a UTI.  She was admitted to hospital with acute-on-chronic renal failure.  She was managed by intravenous fluids and then restarted on diuretics, transitioned to oral Demadex.  Her renal function is slowly improving with a discharge creatinine at 6.37, a BUN of 82.    She also had an admission hemoglobin of 7.2, had one dose of Aranesp, and her discharge hemoglobin was 8.6.  Iron studies showed an iron of 31, a TIBC of 232, and a saturation of 13%.  Her ferritin was 303.    Echocardiogram showed an EF of 0000000, grade 2 diastolic dysfunction, moderate left atrial enlargement, very mild aortic stenosis, moderate tricuspid regurgitation with a pulmonary artery systolic pressure of 54, moderate posterior pericardial effusion.    PAST MEDICAL HISTORY/PROBLEM LIST:    Acute-on-chronic renal failure stage IV.    Anemia of chronic renal disease.    Type 2 diabetes.    Hyperlipidemia.     History of CABG, patient states in 2009.      Mild dementia.    Gait ataxia with frequent falls.    Uremia.    Hyperkalemia.    Unspecified thrombocytopenia.    CURRENT MEDICATIONS:  Discharge medications include:      Amlodipine 10 mg daily.    Calcitriol 0.25 daily.    Pletal 100 b.i.d.    Aricept 10 mg daily.    Ferrous sulfate 325 b.i.d.    Neurontin 300 at bedtime.    Tradjenta 5 mg every morning.    Crestor 5 mg every morning.     Renvela 800 three times a day.    Sodium bicarb 650 b.i.d.    Demadex 10 mg, five tablets (50 mg) daily.    Vitamin D 50,000 U every seven days.    SOCIAL HISTORY:   HOUSING:  The patient states she lives on her own at [facility], which is independent apartments.   FUNCTIONAL STATUS:  Her exact functional status is unclear.  She states she did not do her outside activities, although it is not really clear to me who was doing this.  She states, "friends".    FAMILY HISTORY:  She does not carry any family history of diabetes.   CHILDREN:  She  had one daughter who died of cancer.    REVIEW OF SYSTEMS:   CHEST/RESPIRATORY:  Clear air entry bilaterally.  She is not complaining of shortness of breath.   CARDIAC:   No clear chest pain or palpitations.   GI:  No nausea, vomiting or diarrhea.   GU:  No urinary difficulties.   NEUROLOGICAL:    She describes non-syncopal falls, weakness in her legs, unsteadiness of her gait, numbness in her hands.    PHYSICAL EXAMINATION:   VITAL SIGNS:   O2 SATURATIONS:  96%.   PULSE:  71 and regular.   RESPIRATIONS:  18 and unlabored.   BLOOD PRESSURE:  Supine blood pressure was 140/60.  Standing, this was 130/60.  She clearly has orthostatic symptoms, but not much of an orthostatic drop.   GENERAL APPEARANCE:  The patient is pleasant, cooperative, alert, and conversational.   CHEST/RESPIRATORY:  A few bibasilar crackles.   CARDIOVASCULAR:  CARDIAC:  2/6 midsystolic murmur heard all over  the precordium.  It does not radiate.  There is no S3, no S4.  JVP is not elevated.  She does not have coccyx edema.  However, she has 3+ edema up into her mid calf area, even into her abdominal flanks.   GASTROINTESTINAL:  LIVER/SPLEEN/KIDNEYS:  No liver, no spleen.  No tenderness.    GENITOURINARY:  BLADDER:   No suprapubic fullness or masses.  No CVA tenderness.   NEUROLOGICAL:   She does not have pronator drift.  She does have asterixis in the right arm greater than left.  She has profound lower extremity weakness, barely 3+/5 hip flexor, abductor, 4/5 dorsi- and plantar flexion.   BALANCE/GAIT:  She can bring herself to a standing position.  However, her gait is wide-based and extremely unsteady.   PSYCHIATRIC:   MENTAL STATUS:   The patient is alert, conversational, can provide most of her history.  She is not orientated to the year.  I suspect the dementia as mild is probably correct.    ASSESSMENT/PLAN:  Type 2 diabetes with complications including chronic renal failure which is stage IV, severe peripheral neuropathy.    Chronic renal failure stage IV, acute-on-chronic.  She is clearly fluid volume-expanded.  We will need to watch her weight.  Weight yesterday was 196, today at 201. She is very close to dialysis.    History of coronary artery disease.  Status post CABG.  There is no evidence that this is active.    Diastolic heart failure.  Echocardiogram during this admission showed grade 2 diastolic relaxation, mild AS and mild TR.  She does have pulmonary hypertension, however.    Dementia.  I think this is mild.  A Lillia Corporal will be in order since this patient is likely to be considered for returning to a very independent setting.     gait ataxia.  It is not really surprising to see why this patient falls.  I presume she was using a walker at home.  Her balance is impaired, wide-based.  She does have orthostatic hypotension.  However, she is even more symptomatic than my testing at  the bedside would suggest.    There is a lot that will need to be done for this patient.  She will probably need consideration of Aranesp.  She already received one dose in the hospital.  She was not discharged on this here.    I am a bit surprised she was not referred for a shunt placement.  The patient states she would  agree to dialysis, although I am not really certain in her setting whether this is going to be logistically feasible.  Her blood sugars are reasonably stable.  I will cut her down to twice a day monitoring.  A hemoglobin A1c is in order if one was not done during this hospitalization.    CPT CODE: 16109

## 2013-08-31 ENCOUNTER — Non-Acute Institutional Stay (SKILLED_NURSING_FACILITY): Payer: Medicare Other | Admitting: Internal Medicine

## 2013-08-31 ENCOUNTER — Other Ambulatory Visit: Payer: Self-pay

## 2013-08-31 DIAGNOSIS — N184 Chronic kidney disease, stage 4 (severe): Secondary | ICD-10-CM

## 2013-08-31 DIAGNOSIS — R269 Unspecified abnormalities of gait and mobility: Secondary | ICD-10-CM | POA: Insufficient documentation

## 2013-08-31 LAB — GLUCOSE, CAPILLARY
GLUCOSE-CAPILLARY: 187 mg/dL — AB (ref 70–99)
Glucose-Capillary: 123 mg/dL — ABNORMAL HIGH (ref 70–99)

## 2013-09-01 ENCOUNTER — Telehealth: Payer: Self-pay | Admitting: Vascular Surgery

## 2013-09-01 ENCOUNTER — Encounter: Payer: Self-pay | Admitting: Vascular Surgery

## 2013-09-01 LAB — GLUCOSE, CAPILLARY
GLUCOSE-CAPILLARY: 141 mg/dL — AB (ref 70–99)
GLUCOSE-CAPILLARY: 175 mg/dL — AB (ref 70–99)

## 2013-09-01 NOTE — Telephone Encounter (Addendum)
Message copied by Doristine Section on Thu Sep 01, 2013  9:21 AM ------      Message from: Michaele Offer S      Created: Wed Aug 31, 2013  4:13 PM      Regarding: needs vasc study/ appt       Dr. Rhona Leavens office would like this pt. to have an appt. with any MD to schedule appt. for permanent dialysis access.  Please schedule for vein mapping and office visit within the next 2-3 weeks. ------  l/m for [name] notifying him of an appt. for [name] on 09-02-13 at 1 pm

## 2013-09-02 ENCOUNTER — Encounter (HOSPITAL_COMMUNITY): Payer: Self-pay | Admitting: *Deleted

## 2013-09-02 ENCOUNTER — Ambulatory Visit: Payer: Medicare Other | Admitting: Vascular Surgery

## 2013-09-02 ENCOUNTER — Encounter (HOSPITAL_COMMUNITY): Payer: Medicare Other

## 2013-09-02 ENCOUNTER — Other Ambulatory Visit: Payer: Self-pay

## 2013-09-02 DIAGNOSIS — N186 End stage renal disease: Secondary | ICD-10-CM

## 2013-09-02 DIAGNOSIS — Z01810 Encounter for preprocedural cardiovascular examination: Secondary | ICD-10-CM

## 2013-09-02 LAB — GLUCOSE, CAPILLARY
GLUCOSE-CAPILLARY: 159 mg/dL — AB (ref 70–99)
Glucose-Capillary: 146 mg/dL — ABNORMAL HIGH (ref 70–99)

## 2013-09-03 LAB — GLUCOSE, CAPILLARY
GLUCOSE-CAPILLARY: 115 mg/dL — AB (ref 70–99)
GLUCOSE-CAPILLARY: 175 mg/dL — AB (ref 70–99)

## 2013-09-04 LAB — GLUCOSE, CAPILLARY
Glucose-Capillary: 126 mg/dL — ABNORMAL HIGH (ref 70–99)
Glucose-Capillary: 168 mg/dL — ABNORMAL HIGH (ref 70–99)

## 2013-09-04 MED ORDER — DEXTROSE 5 % IV SOLN
1.5000 g | INTRAVENOUS | Status: AC
  Administered 2013-09-05: 1.5 g via INTRAVENOUS
  Filled 2013-09-04: qty 1.5

## 2013-09-05 ENCOUNTER — Encounter (HOSPITAL_COMMUNITY): Payer: Self-pay | Admitting: *Deleted

## 2013-09-05 ENCOUNTER — Encounter: Payer: Self-pay | Admitting: Cardiology

## 2013-09-05 ENCOUNTER — Encounter (HOSPITAL_COMMUNITY): Payer: Medicare Other | Admitting: Anesthesiology

## 2013-09-05 ENCOUNTER — Ambulatory Visit (HOSPITAL_COMMUNITY)
Admission: RE | Admit: 2013-09-05 | Discharge: 2013-09-05 | Disposition: A | Discharge status: Home / Self Care | Payer: Medicare Other | Source: Ambulatory Visit | Attending: Vascular Surgery | Admitting: Vascular Surgery

## 2013-09-05 ENCOUNTER — Ambulatory Visit (HOSPITAL_COMMUNITY): Payer: Medicare Other

## 2013-09-05 ENCOUNTER — Ambulatory Visit (HOSPITAL_COMMUNITY): Payer: Medicare Other | Admitting: Anesthesiology

## 2013-09-05 ENCOUNTER — Encounter (HOSPITAL_COMMUNITY)
Admission: RE | Disposition: A | Discharge status: Home / Self Care | Payer: Self-pay | Source: Ambulatory Visit | Attending: Vascular Surgery

## 2013-09-05 DIAGNOSIS — N185 Chronic kidney disease, stage 5: Secondary | ICD-10-CM

## 2013-09-05 DIAGNOSIS — I120 Hypertensive chronic kidney disease with stage 5 chronic kidney disease or end stage renal disease: Secondary | ICD-10-CM | POA: Diagnosis present

## 2013-09-05 DIAGNOSIS — N186 End stage renal disease: Secondary | ICD-10-CM | POA: Insufficient documentation

## 2013-09-05 DIAGNOSIS — Z951 Presence of aortocoronary bypass graft: Secondary | ICD-10-CM | POA: Insufficient documentation

## 2013-09-05 DIAGNOSIS — F0390 Unspecified dementia without behavioral disturbance: Secondary | ICD-10-CM | POA: Insufficient documentation

## 2013-09-05 DIAGNOSIS — N184 Chronic kidney disease, stage 4 (severe): Secondary | ICD-10-CM

## 2013-09-05 DIAGNOSIS — E785 Hyperlipidemia, unspecified: Secondary | ICD-10-CM | POA: Diagnosis not present

## 2013-09-05 DIAGNOSIS — R279 Unspecified lack of coordination: Secondary | ICD-10-CM | POA: Diagnosis not present

## 2013-09-05 DIAGNOSIS — D631 Anemia in chronic kidney disease: Secondary | ICD-10-CM | POA: Diagnosis not present

## 2013-09-05 DIAGNOSIS — I2510 Atherosclerotic heart disease of native coronary artery without angina pectoris: Secondary | ICD-10-CM | POA: Insufficient documentation

## 2013-09-05 DIAGNOSIS — Z79899 Other long term (current) drug therapy: Secondary | ICD-10-CM | POA: Diagnosis not present

## 2013-09-05 DIAGNOSIS — G589 Mononeuropathy, unspecified: Secondary | ICD-10-CM | POA: Insufficient documentation

## 2013-09-05 DIAGNOSIS — E119 Type 2 diabetes mellitus without complications: Secondary | ICD-10-CM | POA: Diagnosis not present

## 2013-09-05 DIAGNOSIS — N039 Chronic nephritic syndrome with unspecified morphologic changes: Secondary | ICD-10-CM | POA: Diagnosis not present

## 2013-09-05 HISTORY — DX: Ataxic gait: R26.0

## 2013-09-05 HISTORY — DX: Hyperlipidemia, unspecified: E78.5

## 2013-09-05 HISTORY — DX: Unspecified dementia, unspecified severity, without behavioral disturbance, psychotic disturbance, mood disturbance, and anxiety: F03.90

## 2013-09-05 HISTORY — PX: INSERTION OF DIALYSIS CATHETER: SHX1324

## 2013-09-05 HISTORY — DX: Family history of other specified conditions: Z84.89

## 2013-09-05 LAB — GLUCOSE, CAPILLARY
GLUCOSE-CAPILLARY: 152 mg/dL — AB (ref 70–99)
Glucose-Capillary: 246 mg/dL — ABNORMAL HIGH (ref 70–99)
Glucose-Capillary: 136 mg/dL — ABNORMAL HIGH (ref 70–99)
Glucose-Capillary: 153 mg/dL — ABNORMAL HIGH (ref 70–99)
Glucose-Capillary: 170 mg/dL — ABNORMAL HIGH (ref 70–99)

## 2013-09-05 LAB — POCT I-STAT 4, (NA,K, GLUC, HGB,HCT)
Glucose, Bld: 161 mg/dL — ABNORMAL HIGH (ref 70–99)
HEMATOCRIT: 28 % — AB (ref 36.0–46.0)
HEMOGLOBIN: 9.5 g/dL — AB (ref 12.0–15.0)
Potassium: 4.9 mEq/L (ref 3.7–5.3)
Sodium: 134 mEq/L — ABNORMAL LOW (ref 137–147)

## 2013-09-05 SURGERY — INSERTION OF DIALYSIS CATHETER
Anesthesia: General | Site: Neck | Laterality: Left

## 2013-09-05 MED ORDER — MIDAZOLAM HCL 2 MG/2ML IJ SOLN
INTRAMUSCULAR | Status: AC
  Filled 2013-09-05: qty 2

## 2013-09-05 MED ORDER — HYDROMORPHONE HCL PF 1 MG/ML IJ SOLN: 0.2500 mg | INTRAMUSCULAR | Status: DC | PRN

## 2013-09-05 MED ORDER — IOHEXOL 300 MG/ML  SOLN
INTRAMUSCULAR | Status: DC | PRN
  Administered 2013-09-05: 10 mL via INTRAVENOUS

## 2013-09-05 MED ORDER — FENTANYL CITRATE 0.05 MG/ML IJ SOLN
INTRAMUSCULAR | Status: AC
  Filled 2013-09-05: qty 5

## 2013-09-05 MED ORDER — SODIUM CHLORIDE 0.9 % IR SOLN
Status: DC | PRN
  Administered 2013-09-05: 13:00:00

## 2013-09-05 MED ORDER — PROPOFOL 10 MG/ML IV BOLUS
INTRAVENOUS | Status: DC | PRN
  Administered 2013-09-05: 150 mg via INTRAVENOUS

## 2013-09-05 MED ORDER — ONDANSETRON HCL 4 MG/2ML IJ SOLN
INTRAMUSCULAR | Status: AC
  Filled 2013-09-05: qty 2

## 2013-09-05 MED ORDER — FENTANYL CITRATE 0.05 MG/ML IJ SOLN
INTRAMUSCULAR | Status: DC | PRN
  Administered 2013-09-05: 50 ug via INTRAVENOUS

## 2013-09-05 MED ORDER — STERILE WATER FOR INJECTION IJ SOLN
INTRAMUSCULAR | Status: AC
  Filled 2013-09-05: qty 10

## 2013-09-05 MED ORDER — LIDOCAINE HCL (CARDIAC) 20 MG/ML IV SOLN
INTRAVENOUS | Status: DC | PRN
  Administered 2013-09-05: 50 mg via INTRAVENOUS

## 2013-09-05 MED ORDER — EPHEDRINE SULFATE 50 MG/ML IJ SOLN
INTRAMUSCULAR | Status: AC
  Filled 2013-09-05: qty 1

## 2013-09-05 MED ORDER — LIDOCAINE HCL (CARDIAC) 20 MG/ML IV SOLN
INTRAVENOUS | Status: AC
  Filled 2013-09-05: qty 5

## 2013-09-05 MED ORDER — PROPOFOL 10 MG/ML IV BOLUS
INTRAVENOUS | Status: AC
  Filled 2013-09-05: qty 20

## 2013-09-05 MED ORDER — LIDOCAINE-EPINEPHRINE (PF) 1 %-1:200000 IJ SOLN
INTRAMUSCULAR | Status: DC | PRN
  Administered 2013-09-05: 20 mL via INTRADERMAL

## 2013-09-05 MED ORDER — EPHEDRINE SULFATE 50 MG/ML IJ SOLN
INTRAMUSCULAR | Status: DC | PRN
  Administered 2013-09-05: 10 mg via INTRAVENOUS
  Administered 2013-09-05 (×2): 5 mg via INTRAVENOUS

## 2013-09-05 MED ORDER — ACETAMINOPHEN-CODEINE #3 300-30 MG PO TABS: 1.0000 | ORAL_TABLET | Freq: Four times a day (QID) | ORAL | Status: DC | PRN

## 2013-09-05 MED ORDER — ONDANSETRON HCL 4 MG/2ML IJ SOLN
INTRAMUSCULAR | Status: DC | PRN
  Administered 2013-09-05: 4 mg via INTRAVENOUS

## 2013-09-05 MED ORDER — ONDANSETRON HCL 4 MG/2ML IJ SOLN: 4.0000 mg | Freq: Once | INTRAMUSCULAR | Status: DC | PRN

## 2013-09-05 MED ORDER — SODIUM CHLORIDE 0.9 % IV SOLN
INTRAVENOUS | Status: DC
  Administered 2013-09-05: 09:00:00 via INTRAVENOUS

## 2013-09-05 MED ORDER — HEPARIN SODIUM (PORCINE) 1000 UNIT/ML IJ SOLN
INTRAMUSCULAR | Status: AC
  Filled 2013-09-05: qty 1

## 2013-09-05 MED ORDER — CHLORHEXIDINE GLUCONATE CLOTH 2 % EX PADS: 6.0000 | MEDICATED_PAD | Freq: Once | CUTANEOUS | Status: DC

## 2013-09-05 MED ORDER — SODIUM CHLORIDE 0.9 % IV SOLN
INTRAVENOUS | Status: DC
  Administered 2013-09-05: 13:00:00 via INTRAVENOUS

## 2013-09-05 MED ORDER — HEPARIN SODIUM (PORCINE) 1000 UNIT/ML IJ SOLN
INTRAMUSCULAR | Status: DC | PRN
Start: 2013-09-05 — End: 2013-09-05
  Administered 2013-09-05: 4.6 [IU] via INTRA_ARTERIAL

## 2013-09-05 SURGICAL SUPPLY — 44 items
BAG DECANTER FOR FLEXI CONT (MISCELLANEOUS) ×3 IMPLANT
BLADE 10 SAFETY STRL DISP (BLADE) ×3 IMPLANT
CATH CANNON HEMO 15F 50CM (CATHETERS) IMPLANT
CATH CANNON HEMO 15FR 19 (HEMODIALYSIS SUPPLIES) IMPLANT
CATH CANNON HEMO 15FR 23CM (HEMODIALYSIS SUPPLIES) ×3 IMPLANT
CATH CANNON HEMO 15FR 31CM (HEMODIALYSIS SUPPLIES) IMPLANT
CATH CANNON HEMO 15FR 32CM (HEMODIALYSIS SUPPLIES) ×3 IMPLANT
CATH STRAIGHT 5FR 65CM (CATHETERS) IMPLANT
COVER PROBE W GEL 5X96 (DRAPES) ×3 IMPLANT
COVER SURGICAL LIGHT HANDLE (MISCELLANEOUS) ×3 IMPLANT
DERMABOND ADVANCED (GAUZE/BANDAGES/DRESSINGS) ×2
DERMABOND ADVANCED .7 DNX12 (GAUZE/BANDAGES/DRESSINGS) ×1 IMPLANT
DRAPE C-ARM 42X72 X-RAY (DRAPES) ×3 IMPLANT
DRAPE CHEST BREAST 15X10 FENES (DRAPES) ×3 IMPLANT
GAUZE SPONGE 2X2 8PLY STRL LF (GAUZE/BANDAGES/DRESSINGS) ×1 IMPLANT
GAUZE SPONGE 4X4 16PLY XRAY LF (GAUZE/BANDAGES/DRESSINGS) ×3 IMPLANT
GLOVE BIO SURGEON STRL SZ7 (GLOVE) ×3 IMPLANT
GLOVE BIOGEL PI IND STRL 7.5 (GLOVE) ×1 IMPLANT
GLOVE BIOGEL PI INDICATOR 7.5 (GLOVE) ×2
GOWN STRL REUS W/ TWL LRG LVL3 (GOWN DISPOSABLE) ×2 IMPLANT
GOWN STRL REUS W/TWL LRG LVL3 (GOWN DISPOSABLE) ×6
KIT BASIN OR (CUSTOM PROCEDURE TRAY) ×3 IMPLANT
KIT ROOM TURNOVER OR (KITS) ×3 IMPLANT
NEEDLE 18GX1X1/2 (RX/OR ONLY) (NEEDLE) ×3 IMPLANT
NEEDLE HYPO 25GX1X1/2 BEV (NEEDLE) ×3 IMPLANT
NS IRRIG 1000ML POUR BTL (IV SOLUTION) ×3 IMPLANT
PACK SURGICAL SETUP 50X90 (CUSTOM PROCEDURE TRAY) ×3 IMPLANT
PAD ARMBOARD 7.5X6 YLW CONV (MISCELLANEOUS) ×6 IMPLANT
SET MICROPUNCTURE 5F STIFF (MISCELLANEOUS) ×3 IMPLANT
SOAP 2 % CHG 4 OZ (WOUND CARE) ×3 IMPLANT
SPONGE GAUZE 2X2 STER 10/PKG (GAUZE/BANDAGES/DRESSINGS) ×2
SUT ETHILON 3 0 PS 1 (SUTURE) ×3 IMPLANT
SUT MNCRL AB 4-0 PS2 18 (SUTURE) ×3 IMPLANT
SYR 20CC LL (SYRINGE) ×6 IMPLANT
SYR 3ML LL SCALE MARK (SYRINGE) ×3 IMPLANT
SYR 5ML LL (SYRINGE) ×3 IMPLANT
SYR CONTROL 10ML LL (SYRINGE) ×3 IMPLANT
SYRINGE 10CC LL (SYRINGE) ×3 IMPLANT
TAPE CLOTH SURG 4X10 WHT LF (GAUZE/BANDAGES/DRESSINGS) ×3 IMPLANT
TOWEL OR 17X24 6PK STRL BLUE (TOWEL DISPOSABLE) ×3 IMPLANT
TOWEL OR 17X26 10 PK STRL BLUE (TOWEL DISPOSABLE) ×3 IMPLANT
WATER STERILE IRR 1000ML POUR (IV SOLUTION) ×3 IMPLANT
WIRE AMPLATZ SS-J .035X180CM (WIRE) IMPLANT
WIRE BENTSON .035X145CM (WIRE) ×3 IMPLANT

## 2013-09-05 NOTE — Anesthesia Preprocedure Evaluation (Addendum)
Anesthesia Evaluation  Patient identified by MRN, date of birth, ID band Patient awake    Reviewed: Allergy & Precautions, H&P , NPO status , Patient's Chart, lab work & pertinent test results  Airway Mallampati: II TM Distance: >3 FB Neck ROM: Full    Dental  (+) Edentulous Upper, Edentulous Lower   Pulmonary          Cardiovascular hypertension, Rhythm:Regular Rate:Normal     Neuro/Psych    GI/Hepatic   Endo/Other  diabetes  Renal/GU      Musculoskeletal   Abdominal   Peds  Hematology   Anesthesia Other Findings   Reproductive/Obstetrics                         Anesthesia Physical Anesthesia Plan  ASA: III  Anesthesia Plan: General   Post-op Pain Management:    Induction: Intravenous  Airway Management Planned: LMA  Additional Equipment:   Intra-op Plan:   Post-operative Plan:   Informed Consent: I have reviewed the patients History and Physical, chart, labs and discussed the procedure including the risks, benefits and alternatives for the proposed anesthesia with the patient or authorized representative who has indicated his/her understanding and acceptance.     Plan Discussed with: CRNA and Anesthesiologist  Anesthesia Plan Comments: (ESRD has not started HD K- 4.9 Type 2 DM glucose 149 Mild dementia Hypertension Anemia of chronic disease CAD S/P CABG 2009 Frequent falls  Roberts Gaudy)       Anesthesia Quick Evaluation

## 2013-09-05 NOTE — H&P (Signed)
Brief History and Physical  History of Present Illness  [name] is a 80 y.o. female who presents with chief complaint: imminent ESRD.  The patient presents today for [facility] placement.    Past Medical History  Diagnosis Date  . Anemia of chronic disease 10/05/2010  . Diabetes mellitus   . Hypertension   . High cholesterol   . Neuropathy   . Anemia, iron deficiency 08/18/2012  . Shortness of breath   . Arthritis     osteoarthritis  . Chronic kidney disease     Acute on Chronic  . Ataxia involving legs     has had several falls  . Hyperlipidemia   . Dementia     "mild" per MD note  . Family history of anesthesia complication     Nephew- had time waking    Past Surgical History  Procedure Laterality Date  . Heart bypass surgery  2008  . Left eye surgery      cataract  . Eye surgery    . Coronary artery bypass graft  2008    History   Social History  . Marital Status: Single    Spouse Name: N/A    Number of Children: N/A  . Years of Education: N/A   Occupational History  . Not on file.   Social History Main Topics  . Smoking status: Never Smoker   . Smokeless tobacco: Never Used  . Alcohol Use: No  . Drug Use: No  . Sexual Activity: Not Currently    Birth Control/ Protection: Post-menopausal   Other Topics Concern  . Not on file   Social History Narrative  . No narrative on file    Family History  Problem Relation Age of Onset  . Diabetes Father   . Cancer Sister   . Aneurysm Mother     No current facility-administered medications on file prior to encounter.   Current Outpatient Prescriptions on File Prior to Encounter  Medication Sig Dispense Refill  . acetaminophen-codeine (TYLENOL #3) 300-30 MG per tablet Take 1 tablet by mouth every 8 (eight) hours as needed for moderate pain.  30 tablet  0  . amLODipine (NORVASC) 10 MG tablet Take 10 mg by mouth every morning. For BLOOD PRESSURE      . calcitRIOL (ROCALTROL) 0.25 MCG capsule Take  1 capsule (0.25 mcg total) by mouth daily.      . cilostazol (PLETAL) 100 MG tablet Take 100 mg by mouth 2 (two) times daily. For CIRCULATION or AS DIRECTED by your physician      . donepezil (ARICEPT) 10 MG tablet Take 10 mg by mouth at bedtime. For MEMORY      . ferrous sulfate 325 (65 FE) MG tablet Take 325 mg by mouth 2 (two) times daily. As SUPPLEMENT      . gabapentin (NEURONTIN) 300 MG capsule Take 300 mg by mouth at bedtime. For NEUROPATHY/NERVE PAIN      . linagliptin (TRADJENTA) 5 MG TABS tablet Take 5 mg by mouth every morning. For BLOOD SUGARS/DIABETES      . rosuvastatin (CRESTOR) 5 MG tablet Take 5 mg by mouth every morning. For CHOLESTEROL      . sevelamer carbonate (RENVELA) 800 MG tablet Take 1 tablet (800 mg total) by mouth 3 (three) times daily with meals.      . sodium bicarbonate 650 MG tablet Take 650 mg by mouth 2 (two) times daily.      Marland Kitchen torsemide (DEMADEX) 10  MG tablet Take 5 tablets (50 mg total) by mouth daily.      . Vitamin D, Ergocalciferol, (DRISDOL) 50000 UNITS CAPS capsule Take 1 capsule (50,000 Units total) by mouth every 7 (seven) days.  30 capsule  3    No Known Allergies  Review of Systems: As listed above, otherwise negative.  Physical Examination  Filed Vitals:   09/05/13 0858  BP: 155/45  Pulse: 85  Temp: 97.9 F (36.6 C)  TempSrc: Oral  Resp: 20  Height: 5\' 6"  (1.676 m)  Weight: 192 lb (87.091 kg)  SpO2: 100%    General: A&O x 3, WDWN  Pulmonary: Sym exp, good air movt, CTAB, no rales, rhonchi, & wheezing  Cardiac: RRR, Nl S1, S2, no Murmurs, rubs or gallops  Gastrointestinal: soft, NTND, -G/R, - HSM, - masses, - CVAT B  Musculoskeletal: M/S 5/5 throughout , Extremities without ischemic changes   Laboratory See [place] is a 80 y.o. female who presents with: imminent ESRD.   The patient is scheduled for: tunneled dialysis catheter placement The patient is aware the risks of tunneled  dialysis catheter placement include but are not limited to: bleeding, infection, central venous injury, pneumothorax, possible venous stenosis, possible malpositioning in the venous system, and possible infections related to long-term catheter presence.   The patient is aware of the risks and agrees to proceed.  Adele Barthel, MD [facility] Office: [phone] Pager: [phone]  09/05/2013, 1:24 PM

## 2013-09-05 NOTE — Progress Notes (Addendum)
[facility] [phone]) called and reviewed meds.  Med red updated.

## 2013-09-05 NOTE — Transfer of Care (Signed)
Immediate Anesthesia Transfer of Care Note  Patient: [name]  Procedure(s) Performed: Procedure(s): INSERTION OF DIALYSIS CATHETER (Left)  Patient Location: PACU  Anesthesia Type:General  Level of Consciousness: patient cooperative and responds to stimulation  Airway & Oxygen Therapy: Patient Spontanous Breathing and Patient connected to nasal cannula oxygen  Post-op Assessment: Report given to PACU RN, Post -op Vital signs reviewed and stable and SR w PACs to Afib 110s. Dr. Linna Caprice aware, 12 lead EKG ordered  Post vital signs: Reviewed and stable  Complications: No apparent anesthesia complications

## 2013-09-05 NOTE — Op Note (Signed)
OPERATIVE NOTE  PROCEDURE: 1. Left internal jugular vein tunneled dialysis catheter placement 2.  Central venogram 3. Bilateral Right internal jugular vein cannulation under ultrasound guidance  PRE-OPERATIVE DIAGNOSIS: end-stage renal failure  POST-OPERATIVE DIAGNOSIS: same as above  SURGEON: Adele Barthel, MD  ANESTHESIA: general  ESTIMATED BLOOD LOSS: 30 cc  FINDING(S): 1.  Unable to pass wire down right internal jugular vein.  Possible valve vs. dissection flap evident on sonosite. 2.  Widely patent left innominate and distal left internal jugular vein. 3.  Widely patent superior vena cava  4.  Cannulation of branch off internal jugular vein on contrast study 5.  Tips of the catheter in the right atrium on fluoroscopy 6.  No obvious pneumothorax on fluoroscopy  SPECIMEN(S):  none  INDICATIONS:   [name] is a 80 y.o. female who presents with imminent end stage renal disease.  The patient presents for tunneled dialysis catheter placement.  The patient is aware the risks of tunneled dialysis catheter placement include but are not limited to: bleeding, infection, central venous injury, pneumothorax, possible venous stenosis, possible malpositioning in the venous system, and possible infections related to long-term catheter presence.  The patient was aware of these risks and agreed to proceed.  DESCRIPTION: After written full informed consent was obtained from the patient, the patient was taken back to the operating room.  Prior to induction, the patient was given IV antibiotics.  After obtaining adequate sedation, the patient was prepped and draped in the standard fashion for a chest or neck tunneled dialysis catheter placement.  I anesthesized the right neck cannulation site with local anesthetic.  Under ultrasound guidance, the right internal jugular vein was cannulated with the 18 gauge needle.  A J-wire was then placed through the wire but would not advance.  I pulled  out the wire and readjusted needle position.  Retaining venous pullback without any resistance.  I again tried to place the wire, but it encountered the same resistance.  I pulled out the vein and held pressure for a few minutes.  Under ultrasound guidance, I recannulated the right internal jugular vein with a micropuncture needle.  The wire again would not advance.  On Sonosite, there appeared to be a valve vs. dissection flap present.  I pulled the needle and held pressure for a few minutes.  I turned my attention to the left neck, I cannulated the left internal jugular vein under Sonosite guidance.  Again the wire would not pass, I injected contrast via the left needle, demonstrating the above findings.   I pulled the needle and held pressure for a few minutes.  I recannulated the left internal jugular vein under Sonosite guidance.  This time I passed a Benson wire through the needle, down into the inferior vena cava under fluroscopic guidance.  The wire was then secured in place with a clamp to the drapes.  The cannulation site, the catheter exit site, and tract for the subcutaneous tunnel were then anesthesized with a total of 20 cc of a 1:1 mixture of 0.5% Marcaine without epinepherine and 1% Lidocaine with epinepherine.  I then made stab incisions at the neck and exit sites.   I dissected from the exit site to the cannulation site with a metal tunneler.   The subcutaneous tunnel was dilated by passing a plastic dilator over the metal dissector. The wire was then unclamped and I removed the needle.  The skin tract and venotomy was dilated serially with dilators.  Finally,  the dilator-sheath was placed under fluroscopic guidance into the superior vena cava.  The dilator and wire were removed.  A 27 cm Diatek catheter was placed under fluoroscopic guidance down into the right atrium.  The sheath was broken and peeled away while holding the catheter cuff at the level of the skin.  The back end of this catheter  was transected, revealing the two lumens of this catheter.  The ports were docked onto these two lumens.  The catheter hub was then screwed into place.  Each port was tested by aspirating and flushing.  No resistance was noted.  Each port was then thoroughly flushed with heparinized saline.  The catheter was secured in placed with two interrupted stitches of 3-0 Nylon tied to the catheter.  The neck incision was closed with a U-stitch of 4-0 Monocryl.  The neck and chest incision were cleaned and sterile bandages applied.  Each port was then loaded with concentrated heparin (1000 Units/mL) at the manufacturer recommended volumes to each port.  Sterile caps were applied to each port.  On completion fluoroscopy, the tips of the catheter were in the right atrium, and there was no evidence of pneumothorax.  COMPLICATIONS: none  CONDITION: stable  Adele Barthel, MD [facility] [place] Office: [phone] Pager: [phone]  09/05/2013, 2:08 PM

## 2013-09-05 NOTE — Progress Notes (Signed)
Patient ID: [name], female   DOB: 01-Aug-1930, 80 y.o.   MRN: [id number]                  PROGRESS NOTE  DATE:  08/31/2013    FACILITY: [facility]    LEVEL OF CARE:   SNF   Acute Visit   CHIEF COMPLAINT:  Follow up chronic renal insufficiency.    HISTORY OF PRESENT ILLNESS:  [name] is a lady who came to [place], having stage IV chronic renal failure.  Her BUN today is 84, creatinine 6.5.  Her hemoglobin has fallen to 7.2.   She is followed by Nephrology.    Iron studies done in the hospital showed a serum iron of 31, TIBC of 42, a sat of 13%, ferritin of 303.  She is on ferrous sulfate 325 b.i.d.  She did received a single injection of Aranesp in the hospital and her discharge hemoglobin was 8.6.  It is now 7.2.  There is not a clear site of blood loss.  Her hemoglobin seems to have fluctuated up and down and I suspect this is all chronic disease.   ASSESSMENT/PLAN:   I understand that preparations are now being made to dialyze this woman.  She lives independently in a regular apartment.  She does not have easy access to transportation.  I will see if Social Services can start working on this as she will need transportation to dialysis when she leaves here.  She seems very functional in spite of the chronic renal failure.   Therefore, I do not expect that she will be here that long.  She will receive Aranesp, no doubt, at dialysis, as well.    CPT CODE: 91478

## 2013-09-05 NOTE — Progress Notes (Signed)
Dr.Joslin at bedside. Pt in and out of different rhythms. 12lead ekg ordered and Dr.Joslin states he will consult cardiology. Pt asymptomatic. Will cont to monitor closely

## 2013-09-05 NOTE — Discharge Instructions (Signed)

## 2013-09-05 NOTE — Progress Notes (Signed)
[name] is an 80 year old female from [place] with end-stage renal disease, Type 2  DM, hypertension, frequent falls, mild dementia, and CAD S/P CABG in 2009 per patient. She underwent insertion of dialysis catheter toady in preparation for beginning hemodialysis treatments.   Patient was noted have atrial fibrillation on pre-op ECG 08/26/13. In the OR today, she was predominantly in sinus rhythm with intermitant episodes of afib at rate of 100-130. She tolerated the procedure well under general anesthesia with an LMA.  In the recovery room she was awake and alert and denied chest pain, shortness of breath or palpitations. Her rhythm was  predominantly sinus at 70-80 with intermitant PACs and short 10-20 beat runs of atrial fib at at a rate 110-130. BP 117/53 RR 15 O2 Sat 96%.  I discussed her case with Dr. Ron Parker by telephone and he will arrange for her to be seen as an outpatient in the [facility] Office.  Roberts Gaudy, MD

## 2013-09-05 NOTE — Progress Notes (Signed)
Dr.Joslin as bedside to assess pt prior to d/c home. Pt is now in NSR and has been consistently for a while. Pt states she feels great. Dr.Joslin has arranged for a cardiologist to contact pt for appt after d/c. Okay for pt to go home at this time per MD

## 2013-09-05 NOTE — Progress Notes (Unsigned)
Patient ID: [name], female   DOB: 1931/02/22, 80 y.o.   MRN: [id number]  The patient had a dialysis catheter placed today at [facility]. I received a call from Dr. Linna Caprice of anesthesiology. The patient tolerated the procedure well. However he said that she went in and out of atrial fibrillation during the procedure. Her fastest heart rate was in the range of 115. Back in the recovery room she returned to sinus rhythm. He had determined that she had prior bypass surgery. There were no obvious recent cardiology office notes. He wanted [place] to help arrange followup for her in the [place] office.  I have reviewed the records further back. The patient underwent bypass in 2008. There has been no obvious cardiology followup since then.  I last our team to help arrange for a followup cardiology visit in the [place] office to become established and to follow through on the question of atrial fibrillation.  Daryel November, MD

## 2013-09-05 NOTE — Discharge Summary (Signed)
Discharge report called to [facility] at [facility] center.  Discussed patients course and findings.  Questions answered.  Ardyth Harps

## 2013-09-05 NOTE — Anesthesia Postprocedure Evaluation (Signed)
  Anesthesia Post-op Note  Patient: [name]  Procedure(s) Performed: Procedure(s): INSERTION OF DIALYSIS CATHETER (Left)  Patient Location: PACU  Anesthesia Type:General  Level of Consciousness: awake, alert  and oriented  Airway and Oxygen Therapy: Patient Spontanous Breathing  Post-op Pain: none  Post-op Assessment: Post-op Vital signs reviewed, Patient's Cardiovascular Status Stable, Respiratory Function Stable, Patent Airway and Pain level controlled  Post-op Vital Signs: stable  Last Vitals:  Filed Vitals:   09/05/13 1700  BP: 142/68  Pulse: 62  Temp:   Resp: 15    Complications: No apparent anesthesia complications

## 2013-09-06 ENCOUNTER — Other Ambulatory Visit (HOSPITAL_BASED_OUTPATIENT_CLINIC_OR_DEPARTMENT_OTHER): Payer: Self-pay | Admitting: Internal Medicine

## 2013-09-06 ENCOUNTER — Ambulatory Visit (HOSPITAL_COMMUNITY)
Admission: RE | Admit: 2013-09-06 | Discharge: 2013-09-06 | Disposition: A | Discharge status: Home / Self Care | Payer: Medicare Other | Source: Ambulatory Visit | Attending: Internal Medicine | Admitting: Internal Medicine

## 2013-09-06 ENCOUNTER — Non-Acute Institutional Stay (SKILLED_NURSING_FACILITY): Payer: Medicare Other | Admitting: Internal Medicine

## 2013-09-06 ENCOUNTER — Encounter (HOSPITAL_COMMUNITY): Payer: Self-pay | Admitting: Vascular Surgery

## 2013-09-06 DIAGNOSIS — K1370 Unspecified lesions of oral mucosa: Secondary | ICD-10-CM

## 2013-09-06 DIAGNOSIS — K1379 Other lesions of oral mucosa: Secondary | ICD-10-CM

## 2013-09-06 DIAGNOSIS — R0989 Other specified symptoms and signs involving the circulatory and respiratory systems: Secondary | ICD-10-CM

## 2013-09-06 DIAGNOSIS — N179 Acute kidney failure, unspecified: Secondary | ICD-10-CM

## 2013-09-06 DIAGNOSIS — N184 Chronic kidney disease, stage 4 (severe): Secondary | ICD-10-CM

## 2013-09-06 NOTE — Progress Notes (Signed)
Patient ID: [name], female   DOB: September 12, 1930, 80 y.o.   MRN: [id number]   This is an acute visit.  Level of care skilled.  Facility [facility].  Chief complaint-acute visit secondary to wheezing?-Mouth pain-followup she'll placement.  History of present illness.  Patient is a pleasant 80 year old female with a history of chronic kidney disease stage IV-about to begin dialysis she actually had a shunt placed yesterday and apparently tolerated the procedure well.  She is complaining of some lower mouth pain today she describes this as gum pain--she does have dentures that she does not complaining of any trauma to the area.  She also feels she's having a little bit of wheezing.  She does have a history of grade 2 diastolic CHF and is on Demadex 50 mg a day.  She does not complaining of shortness of breath however.  Vital signs appear to be stable.  Family medical social history as been reviewed for progress note on 08/29/2013.  Medications have been reviewed per MAR.  Review of systems.  In general does not complaining of any fever or chills.  Head ears eyes nose mouth and throat-only complaint is lower, mouth pain she describes as gum pain-does not complaining of any difficulty swallowing.  Respiratory does not complain of any shortness of breath but feels she's having some wheezing-no cough noted.  Cardiac does not complaining of chest pain.  GU --does have renal disease about to begin dialysis as noted above.  She does not complaining of GU tenderness.  Neurologic is not complaining of any headache or dizziness.  Muscle skeletal does not complaining of joint pain  Psych-as listed history of dementia although this appears to be quite mild.  Physical exam.  Temperature 96.9 pulse 75 respirations 20 blood pressures variable 149/63-123/76 in this range recently.  In general this is a pleasant elderly female in no distress resting comfortably in bed.  Her skin is  warm and dry I do not shunt placement left upper thorax no sign of infection erythema swelling or tenderness.  Mild stash I did inspect her lower mouth gums I did not really see any blisters or open areas there is some tenderness more on the right side of her mouth-she does appear to have a slight brownish film of her tongue  Chest she does have some crackles more at the bases there is no labored breathing did not really hear any wheezing although patient reports this occasionally.  Heart is regular rate and rhythm with a 2/6 midsystolic murmur she has I would say 2-3+ edema of her lower extremities.  Abdomen is soft nontender with positive bowel sounds.  Muscle skeletal does move all extremities x4 but has significant lower extremity weakness and with edema as noted above.  Neurologic-no lateralizing findings-cranial nerves are intact her speech is clear.  .  Labs.  08/30/2013.  WBC 5.1 hemoglobin 7.4 platelets 108.  Sodium 136 potassium 4.9 BUN 83 creatinine 6.47.  Assessment and plan.  . #1-question chest congestion--will check a chest x-ray rule out any acute process pneumonia or pulmonary edema-clinically she appears stable-monitor vital signs pulse ox every shift for 72 hours   #2-mouth discomfort-at this point will treat with Magic mouthwash 4 times a day and monitor.  #3 history of renal disease-shunt area appears unremarkable she is going to start dialysis-I suspect this will help with her fluid issues as well.  [id number].

## 2013-09-07 ENCOUNTER — Inpatient Hospital Stay
Admission: RE | Admit: 2013-09-07 | Discharge: 2013-10-05 | Disposition: A | Discharge status: Still In Hospital | Payer: Medicare Other | Source: Ambulatory Visit | Attending: Internal Medicine | Admitting: Internal Medicine

## 2013-09-07 ENCOUNTER — Non-Acute Institutional Stay (SKILLED_NURSING_FACILITY): Payer: Medicare Other | Admitting: Internal Medicine

## 2013-09-07 DIAGNOSIS — N184 Chronic kidney disease, stage 4 (severe): Secondary | ICD-10-CM

## 2013-09-12 NOTE — Progress Notes (Addendum)
Patient ID: [name], female   DOB: September 08, 1930, 80 y.o.   MRN: [id number]                  PROGRESS NOTE  DATE:  09/07/2013    FACILITY: [facility]    LEVEL OF CARE:   SNF   Acute Visit   HISTORY OF PRESENT ILLNESS:   [name] is an 80 year-old woman whom we received late in May.  She was admitted to hospital with acute-on-chronic renal failure and since then has been prepped to begin dialysis tomorrow by placement of a right subclavian catheter.   Apparently during the installation of the catheter, she developed some PACs.  She went on to have a chest x-ray that showed no acute abnormality.    She has, however, accumulated a fair amount of edema in her short stay here.  She has gone from 196 pounds up to 207 pounds from 08/28/2013 through 09/07/2013, a gain of 11 pounds.    Lab work from 08/30/2013 showed a BUN of 83, a creatinine of 6.47.  Her hemoglobin is 7.4.    As mentioned, she is going to initiate dialysis  apparently tomorrow and I have spoken to a concerned family member on the phone.    PHYSICAL EXAMINATION:   GENERAL APPEARANCE:  The patient is not in any distress, sitting eating her lunch, surrounded by family members.   CHEST/RESPIRATORY:  Her air entry is clear bilaterally.  There are no crackles or wheezes.   CARDIOVASCULAR:  CARDIAC:   Heart sounds are normal.  JVP is visible at 90.   EDEMA/VARICOSITIES:  She has 2+ to 3+ coccyx edema and edema well up into her legs.    ASSESSMENT/PLAN:  Severe chronic renal failure.  She has really a significant increase in edema and also I think that she has mild asterixis.  I am glad that she is going to have dialysis initiated tomorrow.    Social issues.  The patient lives in a very independent setting and I have asked for a Folstein mini-mental status.  She will need transportation to and from dialysis, which  apparently has been arranged.  I am not completely convinced about her ability to do all of  this independently.  We will need to look at her mental status exam.  I think there is concern among the family members, as well.

## 2013-09-13 LAB — GLUCOSE, CAPILLARY
GLUCOSE-CAPILLARY: 169 mg/dL — AB (ref 70–99)
GLUCOSE-CAPILLARY: 135 mg/dL — AB (ref 70–99)
Glucose-Capillary: 166 mg/dL — ABNORMAL HIGH (ref 70–99)

## 2013-09-14 LAB — GLUCOSE, CAPILLARY
GLUCOSE-CAPILLARY: 98 mg/dL (ref 70–99)
Glucose-Capillary: 122 mg/dL — ABNORMAL HIGH (ref 70–99)
Glucose-Capillary: 181 mg/dL — ABNORMAL HIGH (ref 70–99)

## 2013-09-15 LAB — GLUCOSE, CAPILLARY
GLUCOSE-CAPILLARY: 156 mg/dL — AB (ref 70–99)
GLUCOSE-CAPILLARY: 134 mg/dL — AB (ref 70–99)

## 2013-09-16 LAB — GLUCOSE, CAPILLARY
Glucose-Capillary: 187 mg/dL — ABNORMAL HIGH (ref 70–99)
Glucose-Capillary: 169 mg/dL — ABNORMAL HIGH (ref 70–99)

## 2013-09-17 LAB — GLUCOSE, CAPILLARY
GLUCOSE-CAPILLARY: 160 mg/dL — AB (ref 70–99)
GLUCOSE-CAPILLARY: 176 mg/dL — AB (ref 70–99)

## 2013-09-18 LAB — GLUCOSE, CAPILLARY
GLUCOSE-CAPILLARY: 158 mg/dL — AB (ref 70–99)
Glucose-Capillary: 194 mg/dL — ABNORMAL HIGH (ref 70–99)

## 2013-09-19 LAB — GLUCOSE, CAPILLARY
GLUCOSE-CAPILLARY: 139 mg/dL — AB (ref 70–99)
GLUCOSE-CAPILLARY: 190 mg/dL — AB (ref 70–99)

## 2013-09-20 LAB — GLUCOSE, CAPILLARY
GLUCOSE-CAPILLARY: 123 mg/dL — AB (ref 70–99)
Glucose-Capillary: 147 mg/dL — ABNORMAL HIGH (ref 70–99)

## 2013-09-21 LAB — GLUCOSE, CAPILLARY
Glucose-Capillary: 155 mg/dL — ABNORMAL HIGH (ref 70–99)
Glucose-Capillary: 185 mg/dL — ABNORMAL HIGH (ref 70–99)

## 2013-09-22 LAB — GLUCOSE, CAPILLARY
GLUCOSE-CAPILLARY: 123 mg/dL — AB (ref 70–99)
GLUCOSE-CAPILLARY: 193 mg/dL — AB (ref 70–99)

## 2013-09-23 LAB — GLUCOSE, CAPILLARY
Glucose-Capillary: 146 mg/dL — ABNORMAL HIGH (ref 70–99)
Glucose-Capillary: 248 mg/dL — ABNORMAL HIGH (ref 70–99)

## 2013-09-24 LAB — GLUCOSE, CAPILLARY: Glucose-Capillary: 151 mg/dL — ABNORMAL HIGH (ref 70–99)

## 2013-09-25 LAB — GLUCOSE, CAPILLARY
GLUCOSE-CAPILLARY: 148 mg/dL — AB (ref 70–99)
GLUCOSE-CAPILLARY: 185 mg/dL — AB (ref 70–99)
Glucose-Capillary: 205 mg/dL — ABNORMAL HIGH (ref 70–99)

## 2013-09-26 LAB — GLUCOSE, CAPILLARY
Glucose-Capillary: 154 mg/dL — ABNORMAL HIGH (ref 70–99)
Glucose-Capillary: 221 mg/dL — ABNORMAL HIGH (ref 70–99)

## 2013-09-27 LAB — GLUCOSE, CAPILLARY
GLUCOSE-CAPILLARY: 154 mg/dL — AB (ref 70–99)
Glucose-Capillary: 152 mg/dL — ABNORMAL HIGH (ref 70–99)

## 2013-09-28 LAB — GLUCOSE, CAPILLARY
GLUCOSE-CAPILLARY: 187 mg/dL — AB (ref 70–99)
Glucose-Capillary: 282 mg/dL — ABNORMAL HIGH (ref 70–99)

## 2013-09-29 LAB — GLUCOSE, CAPILLARY
GLUCOSE-CAPILLARY: 171 mg/dL — AB (ref 70–99)
Glucose-Capillary: 163 mg/dL — ABNORMAL HIGH (ref 70–99)

## 2013-09-30 LAB — GLUCOSE, CAPILLARY
Glucose-Capillary: 158 mg/dL — ABNORMAL HIGH (ref 70–99)
Glucose-Capillary: 212 mg/dL — ABNORMAL HIGH (ref 70–99)

## 2013-10-01 LAB — GLUCOSE, CAPILLARY: GLUCOSE-CAPILLARY: 167 mg/dL — AB (ref 70–99)

## 2013-10-02 LAB — GLUCOSE, CAPILLARY: Glucose-Capillary: 177 mg/dL — ABNORMAL HIGH (ref 70–99)

## 2013-10-03 LAB — GLUCOSE, CAPILLARY
GLUCOSE-CAPILLARY: 167 mg/dL — AB (ref 70–99)
Glucose-Capillary: 194 mg/dL — ABNORMAL HIGH (ref 70–99)

## 2013-10-04 LAB — GLUCOSE, CAPILLARY
Glucose-Capillary: 150 mg/dL — ABNORMAL HIGH (ref 70–99)
Glucose-Capillary: 178 mg/dL — ABNORMAL HIGH (ref 70–99)

## 2013-10-05 ENCOUNTER — Inpatient Hospital Stay
Admission: RE | Admit: 2013-10-05 | Discharge: 2013-10-22 | Disposition: A | Discharge status: Hospice | Payer: Medicare Other | Source: Ambulatory Visit | Attending: Internal Medicine | Admitting: Internal Medicine

## 2013-10-05 LAB — GLUCOSE, CAPILLARY
GLUCOSE-CAPILLARY: 184 mg/dL — AB (ref 70–99)
Glucose-Capillary: 146 mg/dL — ABNORMAL HIGH (ref 70–99)

## 2013-10-06 LAB — GLUCOSE, CAPILLARY
GLUCOSE-CAPILLARY: 205 mg/dL — AB (ref 70–99)
Glucose-Capillary: 218 mg/dL — ABNORMAL HIGH (ref 70–99)

## 2013-10-07 LAB — GLUCOSE, CAPILLARY
Glucose-Capillary: 164 mg/dL — ABNORMAL HIGH (ref 70–99)
Glucose-Capillary: 188 mg/dL — ABNORMAL HIGH (ref 70–99)

## 2013-10-08 LAB — GLUCOSE, CAPILLARY
GLUCOSE-CAPILLARY: 168 mg/dL — AB (ref 70–99)
Glucose-Capillary: 157 mg/dL — ABNORMAL HIGH (ref 70–99)

## 2013-10-09 LAB — GLUCOSE, CAPILLARY
Glucose-Capillary: 161 mg/dL — ABNORMAL HIGH (ref 70–99)
Glucose-Capillary: 126 mg/dL — ABNORMAL HIGH (ref 70–99)

## 2013-10-10 LAB — GLUCOSE, CAPILLARY
Glucose-Capillary: 173 mg/dL — ABNORMAL HIGH (ref 70–99)
Glucose-Capillary: 161 mg/dL — ABNORMAL HIGH (ref 70–99)

## 2013-10-11 LAB — GLUCOSE, CAPILLARY
GLUCOSE-CAPILLARY: 193 mg/dL — AB (ref 70–99)
Glucose-Capillary: 155 mg/dL — ABNORMAL HIGH (ref 70–99)

## 2013-10-12 ENCOUNTER — Ambulatory Visit: Payer: Self-pay | Admitting: Vascular Surgery

## 2013-10-12 LAB — BASIC METABOLIC PANEL
Anion Gap: 5 — ABNORMAL LOW (ref 7–16)
BUN: 15 mg/dL (ref 7–18)
Calcium, Total: 8.5 mg/dL (ref 8.5–10.1)
Chloride: 98 mmol/L (ref 98–107)
Co2: 34 mmol/L — ABNORMAL HIGH (ref 21–32)
Creatinine: 2.28 mg/dL — ABNORMAL HIGH (ref 0.60–1.30)
EGFR (African American): 23 — ABNORMAL LOW
EGFR (Non-African Amer.): 20 — ABNORMAL LOW
GLUCOSE: 161 mg/dL — AB (ref 65–99)
Osmolality: 278 (ref 275–301)
Potassium: 3.3 mmol/L — ABNORMAL LOW (ref 3.5–5.1)
SODIUM: 137 mmol/L (ref 136–145)

## 2013-10-12 LAB — URINALYSIS, COMPLETE
Bilirubin,UR: NEGATIVE
Glucose,UR: NEGATIVE mg/dL (ref 0–75)
KETONE: NEGATIVE
Leukocyte Esterase: NEGATIVE
Nitrite: NEGATIVE
Ph: 6 (ref 4.5–8.0)
Protein: 100
RBC,UR: 1 /HPF (ref 0–5)
SPECIFIC GRAVITY: 1.004 (ref 1.003–1.030)
Squamous Epithelial: 3
WBC UR: 1 /HPF (ref 0–5)

## 2013-10-12 LAB — CBC
HCT: 32 % — AB (ref 35.0–47.0)
HGB: 10.3 g/dL — ABNORMAL LOW (ref 12.0–16.0)
MCH: 29.3 pg (ref 26.0–34.0)
MCHC: 32.1 g/dL (ref 32.0–36.0)
MCV: 91 fL (ref 80–100)
Platelet: 234 10*3/uL (ref 150–440)
RBC: 3.51 10*6/uL — AB (ref 3.80–5.20)
RDW: 15.2 % — ABNORMAL HIGH (ref 11.5–14.5)
WBC: 6.1 10*3/uL (ref 3.6–11.0)

## 2013-10-12 LAB — GLUCOSE, CAPILLARY
Glucose-Capillary: 157 mg/dL — ABNORMAL HIGH (ref 70–99)
Glucose-Capillary: 174 mg/dL — ABNORMAL HIGH (ref 70–99)

## 2013-10-13 LAB — GLUCOSE, CAPILLARY
GLUCOSE-CAPILLARY: 203 mg/dL — AB (ref 70–99)
Glucose-Capillary: 164 mg/dL — ABNORMAL HIGH (ref 70–99)

## 2013-10-14 ENCOUNTER — Other Ambulatory Visit: Payer: Self-pay | Admitting: *Deleted

## 2013-10-14 LAB — GLUCOSE, CAPILLARY: GLUCOSE-CAPILLARY: 122 mg/dL — AB (ref 70–99)

## 2013-10-14 MED ORDER — ACETAMINOPHEN-CODEINE #3 300-30 MG PO TABS: 1.0000 | ORAL_TABLET | Freq: Four times a day (QID) | ORAL | Status: DC | PRN

## 2013-10-14 NOTE — Telephone Encounter (Signed)
[facility] 

## 2013-10-15 LAB — GLUCOSE, CAPILLARY: GLUCOSE-CAPILLARY: 147 mg/dL — AB (ref 70–99)

## 2013-10-16 LAB — GLUCOSE, CAPILLARY
Glucose-Capillary: 136 mg/dL — ABNORMAL HIGH (ref 70–99)
Glucose-Capillary: 158 mg/dL — ABNORMAL HIGH (ref 70–99)
Glucose-Capillary: 214 mg/dL — ABNORMAL HIGH (ref 70–99)

## 2013-10-17 LAB — GLUCOSE, CAPILLARY
Glucose-Capillary: 236 mg/dL — ABNORMAL HIGH (ref 70–99)
Glucose-Capillary: 148 mg/dL — ABNORMAL HIGH (ref 70–99)
Glucose-Capillary: 216 mg/dL — ABNORMAL HIGH (ref 70–99)

## 2013-10-18 LAB — GLUCOSE, CAPILLARY
GLUCOSE-CAPILLARY: 191 mg/dL — AB (ref 70–99)
Glucose-Capillary: 140 mg/dL — ABNORMAL HIGH (ref 70–99)

## 2013-10-19 ENCOUNTER — Ambulatory Visit: Payer: Self-pay | Admitting: Vascular Surgery

## 2013-10-19 LAB — GLUCOSE, CAPILLARY
Glucose-Capillary: 146 mg/dL — ABNORMAL HIGH (ref 70–99)
Glucose-Capillary: 217 mg/dL — ABNORMAL HIGH (ref 70–99)

## 2013-10-20 LAB — GLUCOSE, CAPILLARY: Glucose-Capillary: 131 mg/dL — ABNORMAL HIGH (ref 70–99)

## 2013-10-21 LAB — GLUCOSE, CAPILLARY
Glucose-Capillary: 174 mg/dL — ABNORMAL HIGH (ref 70–99)
Glucose-Capillary: 143 mg/dL — ABNORMAL HIGH (ref 70–99)
Glucose-Capillary: 157 mg/dL — ABNORMAL HIGH (ref 70–99)

## 2013-10-22 ENCOUNTER — Emergency Department (HOSPITAL_COMMUNITY)
Admission: EM | Admit: 2013-10-22 | Discharge: 2013-10-23 | Disposition: A | Discharge status: Home / Self Care | Payer: Medicare Other | Attending: Emergency Medicine | Admitting: Emergency Medicine

## 2013-10-22 DIAGNOSIS — Z9889 Other specified postprocedural states: Secondary | ICD-10-CM | POA: Diagnosis not present

## 2013-10-22 DIAGNOSIS — M1990 Unspecified osteoarthritis, unspecified site: Secondary | ICD-10-CM | POA: Diagnosis not present

## 2013-10-22 DIAGNOSIS — F0390 Unspecified dementia without behavioral disturbance: Secondary | ICD-10-CM | POA: Insufficient documentation

## 2013-10-22 DIAGNOSIS — L03114 Cellulitis of left upper limb: Secondary | ICD-10-CM

## 2013-10-22 DIAGNOSIS — E785 Hyperlipidemia, unspecified: Secondary | ICD-10-CM | POA: Diagnosis not present

## 2013-10-22 DIAGNOSIS — Z992 Dependence on renal dialysis: Secondary | ICD-10-CM | POA: Diagnosis not present

## 2013-10-22 DIAGNOSIS — E119 Type 2 diabetes mellitus without complications: Secondary | ICD-10-CM | POA: Insufficient documentation

## 2013-10-22 DIAGNOSIS — IMO0002 Reserved for concepts with insufficient information to code with codable children: Secondary | ICD-10-CM | POA: Diagnosis not present

## 2013-10-22 DIAGNOSIS — Z792 Long term (current) use of antibiotics: Secondary | ICD-10-CM | POA: Insufficient documentation

## 2013-10-22 DIAGNOSIS — E7800 Pure hypercholesterolemia, unspecified: Secondary | ICD-10-CM | POA: Insufficient documentation

## 2013-10-22 DIAGNOSIS — M7989 Other specified soft tissue disorders: Secondary | ICD-10-CM | POA: Diagnosis present

## 2013-10-22 DIAGNOSIS — N179 Acute kidney failure, unspecified: Secondary | ICD-10-CM | POA: Diagnosis not present

## 2013-10-22 DIAGNOSIS — N186 End stage renal disease: Secondary | ICD-10-CM | POA: Insufficient documentation

## 2013-10-22 DIAGNOSIS — D638 Anemia in other chronic diseases classified elsewhere: Secondary | ICD-10-CM | POA: Insufficient documentation

## 2013-10-22 DIAGNOSIS — Z951 Presence of aortocoronary bypass graft: Secondary | ICD-10-CM | POA: Diagnosis not present

## 2013-10-22 DIAGNOSIS — Z79899 Other long term (current) drug therapy: Secondary | ICD-10-CM | POA: Diagnosis not present

## 2013-10-22 DIAGNOSIS — G589 Mononeuropathy, unspecified: Secondary | ICD-10-CM | POA: Diagnosis not present

## 2013-10-22 DIAGNOSIS — I129 Hypertensive chronic kidney disease with stage 1 through stage 4 chronic kidney disease, or unspecified chronic kidney disease: Secondary | ICD-10-CM | POA: Insufficient documentation

## 2013-10-22 LAB — GLUCOSE, CAPILLARY: Glucose-Capillary: 186 mg/dL — ABNORMAL HIGH (ref 70–99)

## 2013-10-22 MED ORDER — CEPHALEXIN 500 MG PO CAPS
500.0000 mg | ORAL_CAPSULE | Freq: Once | ORAL | Status: AC
  Administered 2013-10-23: 500 mg via ORAL
  Filled 2013-10-22: qty 1

## 2013-10-22 MED ORDER — CEPHALEXIN 500 MG PO CAPS: 500.0000 mg | ORAL_CAPSULE | Freq: Four times a day (QID) | ORAL | Status: DC

## 2013-10-22 NOTE — Discharge Instructions (Signed)
Cellulitis You may have an early infection of the skin near your fistula sight.  THe fistula has a good thrill and no obvious aneurysm noted.  Will place you on antibiotics.  If you develop fevers, pain, increasing redness or swelling you need to be re-evaluated immediately.  Cellulitis is an infection of the skin and the tissue beneath it. The infected area is usually red and tender. Cellulitis occurs most often in the arms and lower legs.  CAUSES  Cellulitis is caused by bacteria that enter the skin through cracks or cuts in the skin. The most common types of bacteria that cause cellulitis are staphylococci and streptococci. SIGNS AND SYMPTOMS   Redness and warmth.  Swelling.  Tenderness or pain.  Fever. DIAGNOSIS  Your health care provider can usually determine what is wrong based on a physical exam. Blood tests may also be done. TREATMENT  Treatment usually involves taking an antibiotic medicine. HOME CARE INSTRUCTIONS   Take your antibiotic medicine as directed by your health care provider. Finish the antibiotic even if you start to feel better.  Keep the infected arm or leg elevated to reduce swelling.  Apply a warm cloth to the affected area up to 4 times per day to relieve pain.  Take medicines only as directed by your health care provider.  Keep all follow-up visits as directed by your health care provider. SEEK MEDICAL CARE IF:   You notice red streaks coming from the infected area.  Your red area gets larger or turns dark in color.  Your bone or joint underneath the infected area becomes painful after the skin has healed.  Your infection returns in the same area or another area.  You notice a swollen bump in the infected area.  You develop new symptoms.  You have a fever. SEEK IMMEDIATE MEDICAL CARE IF:   You feel very sleepy.  You develop vomiting or diarrhea.  You have a general ill feeling (malaise) with muscle aches and pains. MAKE SURE YOU:    Understand these instructions.  Will watch your condition.  Will get help right away if you are not doing well or get worse. Document Released: 12/25/2004 Document Revised: 08/01/2013 Document Reviewed: 06/02/2011 [facility] Patient Information 2015 [place], [place]. This information is not intended to replace advice given to you by your health care provider. Make sure you discuss any questions you have with your health care provider.

## 2013-10-22 NOTE — ED Provider Notes (Signed)
CSN: [id number]     Arrival date & time 10/22/13  2313 History   First MD Initiated Contact with Patient 10/22/13 2319    This chart was scribed for Merryl Hacker, MD by Terressa Koyanagi, ED Scribe. This patient was seen in room APA03/APA03 and the patient's care was started at 11:26 PM.  Chief Complaint  Patient presents with  . Dialysis graft problem    HPI HPI Comments: [name] is a 80 y.o. female who presents to the Emergency Department complaining of swelling and associated erythema to her new fistula site which was put in 3 days ago in [place], [place]. Pt denies any pain in her fistula site. Pt denies fever or any other Sx. Pt reports her last dialysis was today through a dialysis catheter in her left upper chest.  She denies any systemic symptoms. She is just noted swelling of the site and her vascular surgeon requested to have it evaluated.  Past Medical History  Diagnosis Date  . Anemia of chronic disease 10/05/2010  . Diabetes mellitus   . Hypertension   . High cholesterol   . Neuropathy   . Anemia, iron deficiency 08/18/2012  . Shortness of breath   . Arthritis     osteoarthritis  . Chronic kidney disease     Acute on Chronic  . Ataxia involving legs     has had several falls  . Hyperlipidemia   . Dementia     "mild" per MD note  . Family history of anesthesia complication     Nephew- had time waking   Past Surgical History  Procedure Laterality Date  . Heart bypass surgery  2008  . Left eye surgery      cataract  . Eye surgery    . Coronary artery bypass graft  2008  . Insertion of dialysis catheter Left 09/05/2013    Procedure: INSERTION OF DIALYSIS CATHETER;  Surgeon: Conrad Yates Center, MD;  Location: [facility] OR;  Service: Vascular;  Laterality: Left;   Family History  Problem Relation Age of Onset  . Diabetes Father   . Cancer Sister   . Aneurysm Mother    History  Substance Use Topics  . Smoking status: Never Smoker   . Smokeless tobacco: Never Used  .  Alcohol Use: No   OB History   Grav Para Term Preterm Abortions TAB SAB Ect Mult Living                 Review of Systems  Constitutional: Negative for fever.  Respiratory: Negative for chest tightness and shortness of breath.   Cardiovascular: Negative for chest pain.  Genitourinary: Negative for dysuria.  Musculoskeletal: Negative for back pain.  Skin: Positive for color change.       Swelling  All other systems reviewed and are negative.     Allergies  Review of patient's allergies indicates no known allergies.  Home Medications   Prior to Admission medications   Medication Sig Start Date End Date Taking? Authorizing Provider  acetaminophen-codeine (TYLENOL #3) 300-30 MG per tablet Take 1 tablet by mouth every 8 (eight) hours as needed for moderate pain. 08/27/13   Kathie Dike, MD  acetaminophen-codeine (TYLENOL #3) 300-30 MG per tablet Take 1 tablet by mouth every 6 (six) hours as needed for moderate pain. 10/14/13   Man Mast X, NP  amLODipine (NORVASC) 10 MG tablet Take 10 mg by mouth every morning. For BLOOD PRESSURE    Historical Provider, MD  calcitRIOL (ROCALTROL)  0.25 MCG capsule Take 1 capsule (0.25 mcg total) by mouth daily. 08/27/13   Kathie Dike, MD  cephALEXin (KEFLEX) 500 MG capsule Take 1 capsule (500 mg total) by mouth 4 (four) times daily. 10/23/13   Merryl Hacker, MD  cilostazol (PLETAL) 100 MG tablet Take 100 mg by mouth 2 (two) times daily. For CIRCULATION or AS DIRECTED by your physician    Historical Provider, MD  donepezil (ARICEPT) 10 MG tablet Take 10 mg by mouth at bedtime. For MEMORY    Historical Provider, MD  ferrous sulfate 325 (65 FE) MG tablet Take 325 mg by mouth 2 (two) times daily. As SUPPLEMENT    Historical Provider, MD  gabapentin (NEURONTIN) 300 MG capsule Take 300 mg by mouth at bedtime. For NEUROPATHY/NERVE PAIN    Historical Provider, MD  linagliptin (TRADJENTA) 5 MG TABS tablet Take 5 mg by mouth every morning. For BLOOD  SUGARS/DIABETES    Historical Provider, MD  rosuvastatin (CRESTOR) 5 MG tablet Take 5 mg by mouth every morning. For CHOLESTEROL    Historical Provider, MD  sevelamer carbonate (RENVELA) 800 MG tablet Take 1 tablet (800 mg total) by mouth 3 (three) times daily with meals. 08/27/13   Kathie Dike, MD  sodium bicarbonate 650 MG tablet Take 650 mg by mouth 2 (two) times daily.    Historical Provider, MD  torsemide (DEMADEX) 10 MG tablet Take 5 tablets (50 mg total) by mouth daily. 08/27/13   Kathie Dike, MD  Vitamin D, Ergocalciferol, (DRISDOL) 50000 UNITS CAPS capsule Take 1 capsule (50,000 Units total) by mouth every 7 (seven) days. 01/05/13   Rosita Fire, MD   Triage Vitals: BP 172/84  Pulse 78  Temp(Src) 98.4 F (36.9 C) (Oral)  Resp 20  Ht 5\' 6"  (1.676 m)  Wt 170 lb (77.111 kg)  BMI 27.45 kg/m2  SpO2 96% Physical Exam  Nursing note and vitals reviewed. Constitutional: She is oriented to person, place, and time. No distress.  Elderly, no acute  HENT:  Head: Normocephalic and atraumatic.  Cardiovascular: Normal rate, regular rhythm and normal heart sounds.   No murmur heard. Pulmonary/Chest: Effort normal and breath sounds normal. No respiratory distress. She has no wheezes.  Musculoskeletal:  Focused examination of the left upper extremities reveals a fresh surgical site with fistula noted in the upper arm, good thrill, no obvious aneurysm, mild erythema and swelling noted just medial to the surgical site in the antecubital fossa, surgical site appears clean dry and intact without drainage  Neurological: She is alert and oriented to person, place, and time.  Skin: Skin is warm and dry.  Psychiatric: She has a normal mood and affect.    ED Course  Procedures (including critical care time) DIAGNOSTIC STUDIES: Oxygen Saturation is 96% on RA, adequate by my interpretation.    COORDINATION OF CARE:   Labs Review Labs Reviewed - No data to display  Imaging Review No results  found.   EKG Interpretation None      MDM   Final diagnoses:  Cellulitis of left upper extremity    Patient presents with concerns for redness and swelling near a recent surgical site for fistula placement. She is asymptomatic otherwise and denies any systemic complaints including fevers. She states that the site is not painful. Fistula has a good thrill and there is no obvious signs of aneurysm. She does have mild erythema and swelling which may be early cellulitis. At this time do not have access to vascular lab to evaluate  the fistula itself but exam is reassuring. Will place on antibiotics for early cellulitis. Patient was given strict return precautions regarding increasing redness, swelling, fever, or any worsening of symptoms she is to be evaluated immediately. She will followup with her vascular surgeon.  After history, exam, and medical workup I feel the patient has been appropriately medically screened and is safe for discharge home. Pertinent diagnoses were discussed with the patient. Patient was given return precautions.   I personally performed the services described in this documentation, which was scribed in my presence. The recorded information has been reviewed and is accurate.    Merryl Hacker, MD 10/23/13 [phone]

## 2013-10-23 ENCOUNTER — Inpatient Hospital Stay
Admission: RE | Admit: 2013-10-23 | Discharge: 2014-08-25 | Disposition: A | Discharge status: Home / Self Care | Payer: Medicare Other | Source: Ambulatory Visit | Attending: Internal Medicine | Admitting: Internal Medicine

## 2013-10-23 DIAGNOSIS — R05 Cough: Secondary | ICD-10-CM

## 2013-10-23 DIAGNOSIS — R252 Cramp and spasm: Secondary | ICD-10-CM

## 2013-10-23 DIAGNOSIS — R059 Cough, unspecified: Secondary | ICD-10-CM

## 2013-10-23 DIAGNOSIS — M79642 Pain in left hand: Principal | ICD-10-CM

## 2013-10-24 ENCOUNTER — Non-Acute Institutional Stay (SKILLED_NURSING_FACILITY): Payer: Medicare Other | Admitting: Internal Medicine

## 2013-10-24 DIAGNOSIS — T82591D Other mechanical complication of surgically created arteriovenous shunt, subsequent encounter: Secondary | ICD-10-CM

## 2013-10-24 DIAGNOSIS — Z5189 Encounter for other specified aftercare: Secondary | ICD-10-CM

## 2013-10-24 LAB — GLUCOSE, CAPILLARY
GLUCOSE-CAPILLARY: 166 mg/dL — AB (ref 70–99)
Glucose-Capillary: 230 mg/dL — ABNORMAL HIGH (ref 70–99)
Glucose-Capillary: 128 mg/dL — ABNORMAL HIGH (ref 70–99)
Glucose-Capillary: 191 mg/dL — ABNORMAL HIGH (ref 70–99)
Glucose-Capillary: 208 mg/dL — ABNORMAL HIGH (ref 70–99)

## 2013-10-25 LAB — GLUCOSE, CAPILLARY: GLUCOSE-CAPILLARY: 148 mg/dL — AB (ref 70–99)

## 2013-10-26 LAB — GLUCOSE, CAPILLARY
GLUCOSE-CAPILLARY: 190 mg/dL — AB (ref 70–99)
Glucose-Capillary: 152 mg/dL — ABNORMAL HIGH (ref 70–99)

## 2013-10-26 NOTE — Progress Notes (Signed)
Patient ID: [name], female   DOB: [birth year], 80 y.o.   MRN: [id number]               PROGRESS NOTE  DATE:  10/24/2013    FACILITY: [facility]    LEVEL OF CARE:   SNF   Acute Visit   CHIEF COMPLAINT:  Follow up creation of AV shunt in the left arm.    HISTORY OF PRESENT ILLNESS:  This is a patient who is on chronic dialysis who apparently had the creation of a shunt.  This was  apparently done last Wednesday.  There was some concern over the weekend about swelling and erythema.  She was put on Keflex for four days on 10/18/2013.    REVIEW OF SYSTEMS:   SKIN:  Left arm:  The patient is not complaining of pain.  She has noticed some edema.    PHYSICAL EXAMINATION:    SKIN:  INSPECTION:  Left arm:  The shunt site looks fine.  There is a good healthy thrill here.  There is some degree of swelling, although I do not think this is out of keeping with a surgical procedure.  There is no tenderness and no evidence of infection.    ASSESSMENT/PLAN:  Cellulitis, left arm.  I am doubtful that this was ever the case.  She was started on Keflex.  I do not really think this is necessary.

## 2013-10-27 LAB — GLUCOSE, CAPILLARY
Glucose-Capillary: 147 mg/dL — ABNORMAL HIGH (ref 70–99)
Glucose-Capillary: 164 mg/dL — ABNORMAL HIGH (ref 70–99)

## 2013-10-28 LAB — GLUCOSE, CAPILLARY: Glucose-Capillary: 148 mg/dL — ABNORMAL HIGH (ref 70–99)

## 2013-10-29 LAB — GLUCOSE, CAPILLARY
GLUCOSE-CAPILLARY: 201 mg/dL — AB (ref 70–99)
Glucose-Capillary: 142 mg/dL — ABNORMAL HIGH (ref 70–99)

## 2013-10-30 LAB — GLUCOSE, CAPILLARY: Glucose-Capillary: 145 mg/dL — ABNORMAL HIGH (ref 70–99)

## 2013-10-31 LAB — GLUCOSE, CAPILLARY
GLUCOSE-CAPILLARY: 175 mg/dL — AB (ref 70–99)
GLUCOSE-CAPILLARY: 132 mg/dL — AB (ref 70–99)
Glucose-Capillary: 200 mg/dL — ABNORMAL HIGH (ref 70–99)

## 2013-11-01 LAB — GLUCOSE, CAPILLARY
GLUCOSE-CAPILLARY: 217 mg/dL — AB (ref 70–99)
Glucose-Capillary: 162 mg/dL — ABNORMAL HIGH (ref 70–99)

## 2013-11-02 LAB — GLUCOSE, CAPILLARY: Glucose-Capillary: 132 mg/dL — ABNORMAL HIGH (ref 70–99)

## 2013-11-03 LAB — GLUCOSE, CAPILLARY
Glucose-Capillary: 197 mg/dL — ABNORMAL HIGH (ref 70–99)
Glucose-Capillary: 170 mg/dL — ABNORMAL HIGH (ref 70–99)
Glucose-Capillary: 172 mg/dL — ABNORMAL HIGH (ref 70–99)

## 2013-11-05 LAB — GLUCOSE, CAPILLARY
GLUCOSE-CAPILLARY: 146 mg/dL — AB (ref 70–99)
Glucose-Capillary: 203 mg/dL — ABNORMAL HIGH (ref 70–99)

## 2013-11-06 LAB — GLUCOSE, CAPILLARY
Glucose-Capillary: 170 mg/dL — ABNORMAL HIGH (ref 70–99)
Glucose-Capillary: 116 mg/dL — ABNORMAL HIGH (ref 70–99)

## 2013-11-07 LAB — GLUCOSE, CAPILLARY
Glucose-Capillary: 152 mg/dL — ABNORMAL HIGH (ref 70–99)
Glucose-Capillary: 135 mg/dL — ABNORMAL HIGH (ref 70–99)

## 2013-11-08 LAB — GLUCOSE, CAPILLARY: Glucose-Capillary: 154 mg/dL — ABNORMAL HIGH (ref 70–99)

## 2013-11-09 ENCOUNTER — Non-Acute Institutional Stay (SKILLED_NURSING_FACILITY): Payer: Medicare Other | Admitting: Internal Medicine

## 2013-11-09 DIAGNOSIS — R059 Cough, unspecified: Secondary | ICD-10-CM

## 2013-11-09 DIAGNOSIS — R05 Cough: Secondary | ICD-10-CM

## 2013-11-09 DIAGNOSIS — I150 Renovascular hypertension: Secondary | ICD-10-CM

## 2013-11-09 LAB — GLUCOSE, CAPILLARY
GLUCOSE-CAPILLARY: 120 mg/dL — AB (ref 70–99)
GLUCOSE-CAPILLARY: 142 mg/dL — AB (ref 70–99)
Glucose-Capillary: 191 mg/dL — ABNORMAL HIGH (ref 70–99)

## 2013-11-10 ENCOUNTER — Other Ambulatory Visit: Payer: Self-pay | Admitting: *Deleted

## 2013-11-10 DIAGNOSIS — I119 Hypertensive heart disease without heart failure: Secondary | ICD-10-CM

## 2013-11-10 HISTORY — DX: Hypertensive heart disease without heart failure: I11.9

## 2013-11-10 LAB — GLUCOSE, CAPILLARY
Glucose-Capillary: 157 mg/dL — ABNORMAL HIGH (ref 70–99)
Glucose-Capillary: 187 mg/dL — ABNORMAL HIGH (ref 70–99)

## 2013-11-10 MED ORDER — ACETAMINOPHEN-CODEINE #3 300-30 MG PO TABS: 1.0000 | ORAL_TABLET | Freq: Four times a day (QID) | ORAL | Status: DC | PRN

## 2013-11-10 NOTE — Progress Notes (Signed)
Patient ID: [name], female   DOB: 1930/04/05, 80 y.o.   MRN: [id number]           PROGRESS NOTE  DATE: 11/09/2013          FACILITY:  [facility]  LEVEL OF CARE: SNF (31)  Acute Visit  CHIEF COMPLAINT:  Manage cough and hypertension.    HISTORY OF PRESENT ILLNESS: I was requested by the staff to assess the patient regarding above problem(s):  COUGH:  Patient is complaining of a dry cough since this morning.  She denies shortness of breath, fever, chills or night sweats.  She is also having runny nose.    HTN:   Pt 's HTN remains stable.  Denies CP, sob, DOE, pedal edema, headaches, dizziness or visual disturbances.  No complications from the medications currently being used.  Last BP :   Patient's blood pressure is reported to be running high at dialysis.   However, in the facility blood pressures are 128/66, 138/84, 139/78, 128/65.      PAST MEDICAL HISTORY : Reviewed.  No changes/see problem list  CURRENT MEDICATIONS: Reviewed per MAR/see medication list  REVIEW OF SYSTEMS:  GENERAL: no change in appetite, no fatigue, no weight changes, no fever, chills or weakness RESPIRATORY: no cough, SOB, DOE,, wheezing, hemoptysis CARDIAC: no chest pain, edema or palpitations GI: no abdominal pain, diarrhea, constipation, heart burn, nausea or vomiting  PHYSICAL EXAMINATION  VS: see VS section  GENERAL: no acute distress, normal body habitus NECK: supple, trachea midline, no neck masses, no thyroid tenderness, no thyromegaly LYMPHATICS: no LAN in the neck, no supraclavicular LAN RESPIRATORY: breathing is even & unlabored, BS CTAB CARDIAC: RRR, no murmur,no extra heart sounds, +1 bilateral lower extremity edema       GI: abdomen soft, normal BS, no masses, no tenderness, no hepatomegaly, no splenomegaly  ASSESSMENT/PLAN:  Cough.  New problem.  Start Robitussin 10 mL t.i.d. for one week.    Renovascular hypertension.  Blood pressures are normal in the facility.   Therefore, we will not add another antihypertensive.    CPT CODE: 16109          [name], [place] [phone]

## 2013-11-11 LAB — GLUCOSE, CAPILLARY: Glucose-Capillary: 123 mg/dL — ABNORMAL HIGH (ref 70–99)

## 2013-11-12 LAB — GLUCOSE, CAPILLARY
GLUCOSE-CAPILLARY: 150 mg/dL — AB (ref 70–99)
Glucose-Capillary: 149 mg/dL — ABNORMAL HIGH (ref 70–99)

## 2013-11-13 LAB — GLUCOSE, CAPILLARY
Glucose-Capillary: 119 mg/dL — ABNORMAL HIGH (ref 70–99)
Glucose-Capillary: 208 mg/dL — ABNORMAL HIGH (ref 70–99)

## 2013-11-14 LAB — GLUCOSE, CAPILLARY
GLUCOSE-CAPILLARY: 149 mg/dL — AB (ref 70–99)
Glucose-Capillary: 167 mg/dL — ABNORMAL HIGH (ref 70–99)

## 2013-11-15 LAB — GLUCOSE, CAPILLARY
GLUCOSE-CAPILLARY: 194 mg/dL — AB (ref 70–99)
Glucose-Capillary: 149 mg/dL — ABNORMAL HIGH (ref 70–99)

## 2013-11-16 LAB — GLUCOSE, CAPILLARY
GLUCOSE-CAPILLARY: 108 mg/dL — AB (ref 70–99)
Glucose-Capillary: 194 mg/dL — ABNORMAL HIGH (ref 70–99)

## 2013-11-17 LAB — GLUCOSE, CAPILLARY
GLUCOSE-CAPILLARY: 178 mg/dL — AB (ref 70–99)
Glucose-Capillary: 123 mg/dL — ABNORMAL HIGH (ref 70–99)

## 2013-11-18 LAB — GLUCOSE, CAPILLARY: Glucose-Capillary: 119 mg/dL — ABNORMAL HIGH (ref 70–99)

## 2013-11-19 LAB — GLUCOSE, CAPILLARY
GLUCOSE-CAPILLARY: 179 mg/dL — AB (ref 70–99)
GLUCOSE-CAPILLARY: 191 mg/dL — AB (ref 70–99)

## 2013-11-20 LAB — GLUCOSE, CAPILLARY
Glucose-Capillary: 132 mg/dL — ABNORMAL HIGH (ref 70–99)
Glucose-Capillary: 154 mg/dL — ABNORMAL HIGH (ref 70–99)

## 2013-11-21 LAB — GLUCOSE, CAPILLARY
Glucose-Capillary: 173 mg/dL — ABNORMAL HIGH (ref 70–99)
Glucose-Capillary: 150 mg/dL — ABNORMAL HIGH (ref 70–99)

## 2013-11-22 LAB — GLUCOSE, CAPILLARY: GLUCOSE-CAPILLARY: 108 mg/dL — AB (ref 70–99)

## 2013-11-23 LAB — GLUCOSE, CAPILLARY
GLUCOSE-CAPILLARY: 181 mg/dL — AB (ref 70–99)
GLUCOSE-CAPILLARY: 163 mg/dL — AB (ref 70–99)
Glucose-Capillary: 157 mg/dL — ABNORMAL HIGH (ref 70–99)

## 2013-11-24 LAB — GLUCOSE, CAPILLARY
Glucose-Capillary: 158 mg/dL — ABNORMAL HIGH (ref 70–99)
Glucose-Capillary: 153 mg/dL — ABNORMAL HIGH (ref 70–99)

## 2013-11-25 LAB — GLUCOSE, CAPILLARY: GLUCOSE-CAPILLARY: 140 mg/dL — AB (ref 70–99)

## 2013-11-26 LAB — GLUCOSE, CAPILLARY
Glucose-Capillary: 127 mg/dL — ABNORMAL HIGH (ref 70–99)
Glucose-Capillary: 199 mg/dL — ABNORMAL HIGH (ref 70–99)

## 2013-11-27 LAB — GLUCOSE, CAPILLARY
GLUCOSE-CAPILLARY: 170 mg/dL — AB (ref 70–99)
Glucose-Capillary: 136 mg/dL — ABNORMAL HIGH (ref 70–99)

## 2013-11-28 LAB — GLUCOSE, CAPILLARY
GLUCOSE-CAPILLARY: 169 mg/dL — AB (ref 70–99)
GLUCOSE-CAPILLARY: 135 mg/dL — AB (ref 70–99)

## 2013-11-29 LAB — GLUCOSE, CAPILLARY: Glucose-Capillary: 134 mg/dL — ABNORMAL HIGH (ref 70–99)

## 2013-11-30 LAB — GLUCOSE, CAPILLARY
GLUCOSE-CAPILLARY: 150 mg/dL — AB (ref 70–99)
Glucose-Capillary: 168 mg/dL — ABNORMAL HIGH (ref 70–99)
Glucose-Capillary: 128 mg/dL — ABNORMAL HIGH (ref 70–99)

## 2013-12-01 LAB — GLUCOSE, CAPILLARY
GLUCOSE-CAPILLARY: 110 mg/dL — AB (ref 70–99)
Glucose-Capillary: 177 mg/dL — ABNORMAL HIGH (ref 70–99)

## 2013-12-02 LAB — GLUCOSE, CAPILLARY: GLUCOSE-CAPILLARY: 134 mg/dL — AB (ref 70–99)

## 2013-12-03 LAB — GLUCOSE, CAPILLARY
Glucose-Capillary: 119 mg/dL — ABNORMAL HIGH (ref 70–99)
Glucose-Capillary: 218 mg/dL — ABNORMAL HIGH (ref 70–99)

## 2013-12-04 LAB — GLUCOSE, CAPILLARY
Glucose-Capillary: 147 mg/dL — ABNORMAL HIGH (ref 70–99)
Glucose-Capillary: 165 mg/dL — ABNORMAL HIGH (ref 70–99)

## 2013-12-05 LAB — GLUCOSE, CAPILLARY: GLUCOSE-CAPILLARY: 143 mg/dL — AB (ref 70–99)

## 2013-12-06 LAB — GLUCOSE, CAPILLARY
GLUCOSE-CAPILLARY: 208 mg/dL — AB (ref 70–99)
Glucose-Capillary: 129 mg/dL — ABNORMAL HIGH (ref 70–99)
Glucose-Capillary: 128 mg/dL — ABNORMAL HIGH (ref 70–99)

## 2013-12-07 LAB — GLUCOSE, CAPILLARY
Glucose-Capillary: 159 mg/dL — ABNORMAL HIGH (ref 70–99)
Glucose-Capillary: 163 mg/dL — ABNORMAL HIGH (ref 70–99)

## 2013-12-08 LAB — GLUCOSE, CAPILLARY
GLUCOSE-CAPILLARY: 211 mg/dL — AB (ref 70–99)
Glucose-Capillary: 149 mg/dL — ABNORMAL HIGH (ref 70–99)

## 2013-12-09 LAB — GLUCOSE, CAPILLARY: Glucose-Capillary: 145 mg/dL — ABNORMAL HIGH (ref 70–99)

## 2013-12-10 LAB — GLUCOSE, CAPILLARY
Glucose-Capillary: 110 mg/dL — ABNORMAL HIGH (ref 70–99)
Glucose-Capillary: 164 mg/dL — ABNORMAL HIGH (ref 70–99)

## 2013-12-11 LAB — GLUCOSE, CAPILLARY
Glucose-Capillary: 106 mg/dL — ABNORMAL HIGH (ref 70–99)
Glucose-Capillary: 158 mg/dL — ABNORMAL HIGH (ref 70–99)

## 2013-12-12 LAB — GLUCOSE, CAPILLARY
Glucose-Capillary: 141 mg/dL — ABNORMAL HIGH (ref 70–99)
Glucose-Capillary: 152 mg/dL — ABNORMAL HIGH (ref 70–99)

## 2013-12-13 LAB — GLUCOSE, CAPILLARY
GLUCOSE-CAPILLARY: 192 mg/dL — AB (ref 70–99)
Glucose-Capillary: 162 mg/dL — ABNORMAL HIGH (ref 70–99)

## 2013-12-14 LAB — GLUCOSE, CAPILLARY
Glucose-Capillary: 146 mg/dL — ABNORMAL HIGH (ref 70–99)
Glucose-Capillary: 163 mg/dL — ABNORMAL HIGH (ref 70–99)

## 2013-12-15 LAB — GLUCOSE, CAPILLARY
Glucose-Capillary: 126 mg/dL — ABNORMAL HIGH (ref 70–99)
Glucose-Capillary: 204 mg/dL — ABNORMAL HIGH (ref 70–99)

## 2013-12-16 LAB — GLUCOSE, CAPILLARY: GLUCOSE-CAPILLARY: 108 mg/dL — AB (ref 70–99)

## 2013-12-17 LAB — GLUCOSE, CAPILLARY
GLUCOSE-CAPILLARY: 157 mg/dL — AB (ref 70–99)
Glucose-Capillary: 103 mg/dL — ABNORMAL HIGH (ref 70–99)

## 2013-12-18 LAB — GLUCOSE, CAPILLARY
GLUCOSE-CAPILLARY: 164 mg/dL — AB (ref 70–99)
GLUCOSE-CAPILLARY: 155 mg/dL — AB (ref 70–99)

## 2013-12-19 LAB — GLUCOSE, CAPILLARY
Glucose-Capillary: 139 mg/dL — ABNORMAL HIGH (ref 70–99)
Glucose-Capillary: 191 mg/dL — ABNORMAL HIGH (ref 70–99)

## 2013-12-20 LAB — GLUCOSE, CAPILLARY
Glucose-Capillary: 128 mg/dL — ABNORMAL HIGH (ref 70–99)
Glucose-Capillary: 198 mg/dL — ABNORMAL HIGH (ref 70–99)

## 2013-12-21 ENCOUNTER — Ambulatory Visit: Payer: Self-pay | Admitting: Vascular Surgery

## 2013-12-21 LAB — GLUCOSE, CAPILLARY
GLUCOSE-CAPILLARY: 120 mg/dL — AB (ref 70–99)
GLUCOSE-CAPILLARY: 158 mg/dL — AB (ref 70–99)

## 2013-12-22 LAB — GLUCOSE, CAPILLARY
GLUCOSE-CAPILLARY: 171 mg/dL — AB (ref 70–99)
GLUCOSE-CAPILLARY: 193 mg/dL — AB (ref 70–99)

## 2013-12-23 LAB — GLUCOSE, CAPILLARY
GLUCOSE-CAPILLARY: 131 mg/dL — AB (ref 70–99)
GLUCOSE-CAPILLARY: 174 mg/dL — AB (ref 70–99)

## 2013-12-24 LAB — GLUCOSE, CAPILLARY: GLUCOSE-CAPILLARY: 163 mg/dL — AB (ref 70–99)

## 2013-12-25 LAB — GLUCOSE, CAPILLARY
Glucose-Capillary: 115 mg/dL — ABNORMAL HIGH (ref 70–99)
Glucose-Capillary: 189 mg/dL — ABNORMAL HIGH (ref 70–99)

## 2013-12-26 LAB — GLUCOSE, CAPILLARY
Glucose-Capillary: 127 mg/dL — ABNORMAL HIGH (ref 70–99)
Glucose-Capillary: 150 mg/dL — ABNORMAL HIGH (ref 70–99)

## 2013-12-27 LAB — GLUCOSE, CAPILLARY
GLUCOSE-CAPILLARY: 112 mg/dL — AB (ref 70–99)
GLUCOSE-CAPILLARY: 171 mg/dL — AB (ref 70–99)

## 2013-12-28 LAB — GLUCOSE, CAPILLARY
GLUCOSE-CAPILLARY: 121 mg/dL — AB (ref 70–99)
GLUCOSE-CAPILLARY: 150 mg/dL — AB (ref 70–99)

## 2013-12-29 LAB — GLUCOSE, CAPILLARY
GLUCOSE-CAPILLARY: 109 mg/dL — AB (ref 70–99)
Glucose-Capillary: 146 mg/dL — ABNORMAL HIGH (ref 70–99)

## 2013-12-30 LAB — GLUCOSE, CAPILLARY
Glucose-Capillary: 112 mg/dL — ABNORMAL HIGH (ref 70–99)
Glucose-Capillary: 185 mg/dL — ABNORMAL HIGH (ref 70–99)

## 2013-12-31 LAB — GLUCOSE, CAPILLARY
GLUCOSE-CAPILLARY: 133 mg/dL — AB (ref 70–99)
GLUCOSE-CAPILLARY: 175 mg/dL — AB (ref 70–99)

## 2014-01-01 LAB — GLUCOSE, CAPILLARY
GLUCOSE-CAPILLARY: 160 mg/dL — AB (ref 70–99)
Glucose-Capillary: 175 mg/dL — ABNORMAL HIGH (ref 70–99)

## 2014-01-02 ENCOUNTER — Encounter: Payer: Self-pay | Admitting: *Deleted

## 2014-01-02 LAB — GLUCOSE, CAPILLARY
Glucose-Capillary: 119 mg/dL — ABNORMAL HIGH (ref 70–99)
Glucose-Capillary: 301 mg/dL — ABNORMAL HIGH (ref 70–99)

## 2014-01-03 LAB — GLUCOSE, CAPILLARY
Glucose-Capillary: 114 mg/dL — ABNORMAL HIGH (ref 70–99)
Glucose-Capillary: 147 mg/dL — ABNORMAL HIGH (ref 70–99)

## 2014-01-04 LAB — GLUCOSE, CAPILLARY
GLUCOSE-CAPILLARY: 136 mg/dL — AB (ref 70–99)
GLUCOSE-CAPILLARY: 179 mg/dL — AB (ref 70–99)

## 2014-01-05 LAB — GLUCOSE, CAPILLARY
Glucose-Capillary: 133 mg/dL — ABNORMAL HIGH (ref 70–99)
Glucose-Capillary: 182 mg/dL — ABNORMAL HIGH (ref 70–99)

## 2014-01-06 LAB — GLUCOSE, CAPILLARY
GLUCOSE-CAPILLARY: 110 mg/dL — AB (ref 70–99)
GLUCOSE-CAPILLARY: 145 mg/dL — AB (ref 70–99)

## 2014-01-07 LAB — GLUCOSE, CAPILLARY
GLUCOSE-CAPILLARY: 120 mg/dL — AB (ref 70–99)
Glucose-Capillary: 189 mg/dL — ABNORMAL HIGH (ref 70–99)

## 2014-01-08 LAB — GLUCOSE, CAPILLARY
GLUCOSE-CAPILLARY: 109 mg/dL — AB (ref 70–99)
Glucose-Capillary: 167 mg/dL — ABNORMAL HIGH (ref 70–99)

## 2014-01-09 LAB — GLUCOSE, CAPILLARY
Glucose-Capillary: 104 mg/dL — ABNORMAL HIGH (ref 70–99)
Glucose-Capillary: 201 mg/dL — ABNORMAL HIGH (ref 70–99)

## 2014-01-10 LAB — GLUCOSE, CAPILLARY: Glucose-Capillary: 108 mg/dL — ABNORMAL HIGH (ref 70–99)

## 2014-01-11 LAB — GLUCOSE, CAPILLARY
GLUCOSE-CAPILLARY: 208 mg/dL — AB (ref 70–99)
GLUCOSE-CAPILLARY: 262 mg/dL — AB (ref 70–99)
Glucose-Capillary: 130 mg/dL — ABNORMAL HIGH (ref 70–99)

## 2014-01-12 LAB — GLUCOSE, CAPILLARY
GLUCOSE-CAPILLARY: 143 mg/dL — AB (ref 70–99)
GLUCOSE-CAPILLARY: 186 mg/dL — AB (ref 70–99)

## 2014-01-13 LAB — GLUCOSE, CAPILLARY: Glucose-Capillary: 111 mg/dL — ABNORMAL HIGH (ref 70–99)

## 2014-01-14 LAB — GLUCOSE, CAPILLARY
Glucose-Capillary: 107 mg/dL — ABNORMAL HIGH (ref 70–99)
Glucose-Capillary: 177 mg/dL — ABNORMAL HIGH (ref 70–99)

## 2014-01-15 LAB — GLUCOSE, CAPILLARY
Glucose-Capillary: 120 mg/dL — ABNORMAL HIGH (ref 70–99)
Glucose-Capillary: 203 mg/dL — ABNORMAL HIGH (ref 70–99)

## 2014-01-16 LAB — GLUCOSE, CAPILLARY
GLUCOSE-CAPILLARY: 166 mg/dL — AB (ref 70–99)
Glucose-Capillary: 128 mg/dL — ABNORMAL HIGH (ref 70–99)

## 2014-01-17 LAB — GLUCOSE, CAPILLARY
Glucose-Capillary: 130 mg/dL — ABNORMAL HIGH (ref 70–99)
Glucose-Capillary: 199 mg/dL — ABNORMAL HIGH (ref 70–99)

## 2014-01-18 ENCOUNTER — Ambulatory Visit: Payer: Self-pay | Admitting: Vascular Surgery

## 2014-01-18 LAB — GLUCOSE, CAPILLARY
GLUCOSE-CAPILLARY: 160 mg/dL — AB (ref 70–99)
Glucose-Capillary: 124 mg/dL — ABNORMAL HIGH (ref 70–99)

## 2014-01-19 LAB — GLUCOSE, CAPILLARY
Glucose-Capillary: 120 mg/dL — ABNORMAL HIGH (ref 70–99)
Glucose-Capillary: 193 mg/dL — ABNORMAL HIGH (ref 70–99)

## 2014-01-20 LAB — GLUCOSE, CAPILLARY
GLUCOSE-CAPILLARY: 114 mg/dL — AB (ref 70–99)
GLUCOSE-CAPILLARY: 210 mg/dL — AB (ref 70–99)

## 2014-01-21 LAB — GLUCOSE, CAPILLARY
GLUCOSE-CAPILLARY: 129 mg/dL — AB (ref 70–99)
GLUCOSE-CAPILLARY: 138 mg/dL — AB (ref 70–99)

## 2014-01-22 LAB — GLUCOSE, CAPILLARY
Glucose-Capillary: 134 mg/dL — ABNORMAL HIGH (ref 70–99)
Glucose-Capillary: 161 mg/dL — ABNORMAL HIGH (ref 70–99)

## 2014-01-23 LAB — GLUCOSE, CAPILLARY
GLUCOSE-CAPILLARY: 118 mg/dL — AB (ref 70–99)
Glucose-Capillary: 123 mg/dL — ABNORMAL HIGH (ref 70–99)

## 2014-01-24 LAB — GLUCOSE, CAPILLARY: Glucose-Capillary: 158 mg/dL — ABNORMAL HIGH (ref 70–99)

## 2014-01-25 LAB — GLUCOSE, CAPILLARY
GLUCOSE-CAPILLARY: 188 mg/dL — AB (ref 70–99)
GLUCOSE-CAPILLARY: 140 mg/dL — AB (ref 70–99)
GLUCOSE-CAPILLARY: 235 mg/dL — AB (ref 70–99)

## 2014-01-26 LAB — GLUCOSE, CAPILLARY
GLUCOSE-CAPILLARY: 187 mg/dL — AB (ref 70–99)
Glucose-Capillary: 139 mg/dL — ABNORMAL HIGH (ref 70–99)

## 2014-01-27 ENCOUNTER — Ambulatory Visit (HOSPITAL_COMMUNITY): Payer: Medicare Other | Attending: Internal Medicine

## 2014-01-27 DIAGNOSIS — M79642 Pain in left hand: Secondary | ICD-10-CM | POA: Diagnosis not present

## 2014-01-27 LAB — GLUCOSE, CAPILLARY: Glucose-Capillary: 109 mg/dL — ABNORMAL HIGH (ref 70–99)

## 2014-01-28 LAB — GLUCOSE, CAPILLARY
Glucose-Capillary: 110 mg/dL — ABNORMAL HIGH (ref 70–99)
Glucose-Capillary: 193 mg/dL — ABNORMAL HIGH (ref 70–99)

## 2014-01-29 LAB — GLUCOSE, CAPILLARY
Glucose-Capillary: 123 mg/dL — ABNORMAL HIGH (ref 70–99)
Glucose-Capillary: 154 mg/dL — ABNORMAL HIGH (ref 70–99)

## 2014-01-30 ENCOUNTER — Other Ambulatory Visit: Payer: Self-pay | Admitting: *Deleted

## 2014-01-30 LAB — GLUCOSE, CAPILLARY
Glucose-Capillary: 129 mg/dL — ABNORMAL HIGH (ref 70–99)
Glucose-Capillary: 164 mg/dL — ABNORMAL HIGH (ref 70–99)

## 2014-01-30 MED ORDER — ACETAMINOPHEN-CODEINE #3 300-30 MG PO TABS: 1.0000 | ORAL_TABLET | Freq: Four times a day (QID) | ORAL | Status: DC | PRN

## 2014-01-30 NOTE — Telephone Encounter (Signed)
[facility] 

## 2014-01-31 LAB — GLUCOSE, CAPILLARY
GLUCOSE-CAPILLARY: 130 mg/dL — AB (ref 70–99)
Glucose-Capillary: 185 mg/dL — ABNORMAL HIGH (ref 70–99)

## 2014-02-01 ENCOUNTER — Encounter: Payer: Self-pay | Admitting: Internal Medicine

## 2014-02-01 ENCOUNTER — Non-Acute Institutional Stay (SKILLED_NURSING_FACILITY): Payer: Medicare Other | Admitting: Internal Medicine

## 2014-02-01 DIAGNOSIS — E1129 Type 2 diabetes mellitus with other diabetic kidney complication: Secondary | ICD-10-CM

## 2014-02-01 DIAGNOSIS — N189 Chronic kidney disease, unspecified: Secondary | ICD-10-CM

## 2014-02-01 DIAGNOSIS — M79642 Pain in left hand: Secondary | ICD-10-CM | POA: Insufficient documentation

## 2014-02-01 DIAGNOSIS — F0390 Unspecified dementia without behavioral disturbance: Secondary | ICD-10-CM

## 2014-02-01 DIAGNOSIS — D631 Anemia in chronic kidney disease: Secondary | ICD-10-CM

## 2014-02-01 DIAGNOSIS — E119 Type 2 diabetes mellitus without complications: Secondary | ICD-10-CM

## 2014-02-01 DIAGNOSIS — N19 Unspecified kidney failure: Secondary | ICD-10-CM

## 2014-02-01 HISTORY — DX: Type 2 diabetes mellitus with other diabetic kidney complication: E11.29

## 2014-02-01 LAB — GLUCOSE, CAPILLARY
GLUCOSE-CAPILLARY: 122 mg/dL — AB (ref 70–99)
Glucose-Capillary: 122 mg/dL — ABNORMAL HIGH (ref 70–99)

## 2014-02-01 NOTE — Progress Notes (Signed)
Patient ID: [name], female   DOB: 1931-01-18, 80 y.o.   MRN: [id number]                                                                                                   1                                                                                                                     This is a routine visit.  Level care skilled.  Facility [facility]   Chief complaint-medical management of chronic medical conditions including end-stage renal disease on chronic hemodialysis-edema-dementia-neuropathy-diabetes type 2  HISTORY OF PRESENT ILLNESS: This is an 80 year-old woman who was admitted to hospital from 08/24/2013 through 08/27/2013.     , she was admitted on this occasion with frequent falls for 4-5 days and generalized weakness .   She was admitted to hospital with a creatinine of 6.87, a BUN of 85. A urinalysis was suggestive of a UTI. She was admitted to hospital with acute-on-chronic renal failure. She was managed by intravenous fluids and then restarted on diuretics, transitioned to oral Demadex.      Echocardiogram showed an EF of 0000000, grade 2 diastolic dysfunction, moderate left atrial enlargement, very mild aortic stenosis, moderate tricuspid regurgitation with a pulmonary artery systolic pressure of 54, moderate posterior pericardial effusion   She continues on torsemide-.  She has been started on dialysis and she appears to be tolerating this well  iin regards to diabetes she is onTradjenta-CBGs appear to  pretty stable in the low to higher 100s   Tonight she has no acute complaints other than some chronic left hand pain she says this has gotten worse the last couple weeks-an x-ray showed likely arthritic changes but no acute process.  Marland Kitchen   PAST MEDICAL HISTORY/PROBLEM LIST:   Acute-on-chronic renal failure stage  IV.   Anemia of chronic renal disease.   Type 2 diabetes.   Hyperlipidemia.   History of CABG, patient states in 2009.   Mild dementia.   Gait ataxia with frequent falls.   Uremia.   Hyperkalemia.   Unspecified thrombocytopenia.  CURRENT MEDICATIONS: reviewed per MAR:     .  SOCIAL HISTORY:  HOUSING: The patient states she livedon her own at [employer], which is independent apartments.   Marland Kitchen   FAMILY HISTORY: She does not carry any family history of diabetes.  CHILDREN: She had one daughter who died of cancer.   REVIEW OF SYSTEMS: Gen. No complaints of fever chills.  Head ears eyes nose mouth and throat does not  complain of visual changes or sore throat.    CHEST/RESPIRATORY: Clear air entry bilaterally. She is not complaining of shortness of breath.  CARDIAC: No clear chest pain or palpitations.  GI: No nausea, vomiting or diarrhea.or abdominal pain  GU: No urinary difficulties.--is now on dialysis  Muscle skeletal does not really complain of joint pain other than in her left hand mainly her fingers  NEUROLOGICAL:  Does not complain of headache or dizziness does complain of some possible neuropathic pain in her left hand-says it feels somewhat numb and this is not a new complaint.--this is more in her fingers   PHYSICAL EXAMINATION:   Temperature 98.1 pulse 96 respirations 20 blood pressures variable 160/71-130/50 9-1 2/52-I do not see consistent elevations     GENERAL APPEARANCE: The patient is pleasant, cooperative, alert, and conversational. Her skin is warm and dry-currently receiving dialysis left upper thorax --she does have a shunt placed in her left upper arm apparently will be used shortly a bruit is appreciated Oropharynx clear mucous membranes moist  CHEST/RESPIRATORY:clear to auscultation no labored breathing .  CARDIOVASCULAR:  CARDIAC: 2/6 midsystolic murmur --regular rate  and rhythm with occasional irregular beat--she has mild lower extremity edema  GASTROINTESTINAL:  Abdomen is soft nontender positive bowel sounds  Muscle skeletal-moves all extremities 4 does ambulate fairly well with her walker she ambulates  quite a bit about facility.-I do not note any acute tenderness to the joints or edema or erythemaof her hand or fingers on the left hand  Left hand grip strength appears to be intact and sensation appears to be intact although she says there has been some chronic numbness  NEUROLOGICAL:--neurologic could not appreciate any lateralizing findings her speech is clear cranial nerves are grossly intact she is able to ambulate with a walker .   Marland Kitchen  PSYCHIATRIC:  MENTAL STATUS: The patient is alert, conversational, can provide most of her history.  I suspect the dementia as mild is probably correct  Labs.  08/30/2013.  WBC 5.1 hemoglobin 7.4 platelets 108.  Sodium 136 potassium 4.9 BUN 83 creatinine 6.47.   ASSESSMENT/PLAN:  Type 2 diabetes with complications including chronic renal failure which is stage IV, severe peripheral neuropathy--she is now on dialysis-blood sugars appear to be stable on an oral agent.    In regards neuropathy she is on Neurontin will have to be careful with dosing of this and she is on dialysis-will write an order to see if dialysis can evaluate this this may be neuropathic pain in her hand.     .   History of coronary artery disease. Status post CABG. There is no evidence that this is active.   Diastolic heart failure. Echocardiogram dshowed grade 2 diastolic relaxation, mild AS and mild TR. She does have pulmonary hypertension, howeve--this appears to be relatively stable she is on diureticr.   Dementia. I think this is mild.she is functioning well in this setting she is on Aricept.                                                         hyperlipidemia-she is on Crestor this may be  as well for coronary artery disease-will try to obtain a lipid panel most easily I suspect obtained at dialysis  Anemia most likely from chronic disease she is on iron.   g.   gait ataxia.  She appears to do fairly well with her walker which is encouraging.    Of note Will obtain CBC CMP fasting lipid panel and hemoglobin A1c for updated values for chart- she appears to be stable.--hopefully labs to be drawn at dialysis if not will have to be drawn here.  In regards to left hand pain this could be neuropathic will have dialysis notified of this and monitor  For any changes--per discussion with her the pain actually appears to be more in her fingers  [id number]   le.

## 2014-02-02 LAB — GLUCOSE, CAPILLARY
GLUCOSE-CAPILLARY: 138 mg/dL — AB (ref 70–99)
Glucose-Capillary: 177 mg/dL — ABNORMAL HIGH (ref 70–99)

## 2014-02-03 LAB — GLUCOSE, CAPILLARY: Glucose-Capillary: 134 mg/dL — ABNORMAL HIGH (ref 70–99)

## 2014-02-04 LAB — GLUCOSE, CAPILLARY
Glucose-Capillary: 120 mg/dL — ABNORMAL HIGH (ref 70–99)
Glucose-Capillary: 171 mg/dL — ABNORMAL HIGH (ref 70–99)

## 2014-02-05 LAB — GLUCOSE, CAPILLARY
GLUCOSE-CAPILLARY: 145 mg/dL — AB (ref 70–99)
Glucose-Capillary: 192 mg/dL — ABNORMAL HIGH (ref 70–99)

## 2014-02-06 LAB — GLUCOSE, CAPILLARY
GLUCOSE-CAPILLARY: 114 mg/dL — AB (ref 70–99)
GLUCOSE-CAPILLARY: 173 mg/dL — AB (ref 70–99)

## 2014-02-07 LAB — GLUCOSE, CAPILLARY
GLUCOSE-CAPILLARY: 150 mg/dL — AB (ref 70–99)
Glucose-Capillary: 108 mg/dL — ABNORMAL HIGH (ref 70–99)

## 2014-02-08 LAB — GLUCOSE, CAPILLARY
GLUCOSE-CAPILLARY: 130 mg/dL — AB (ref 70–99)
GLUCOSE-CAPILLARY: 184 mg/dL — AB (ref 70–99)

## 2014-02-09 LAB — GLUCOSE, CAPILLARY
GLUCOSE-CAPILLARY: 122 mg/dL — AB (ref 70–99)
Glucose-Capillary: 222 mg/dL — ABNORMAL HIGH (ref 70–99)

## 2014-02-10 LAB — GLUCOSE, CAPILLARY: Glucose-Capillary: 116 mg/dL — ABNORMAL HIGH (ref 70–99)

## 2014-02-11 LAB — GLUCOSE, CAPILLARY
Glucose-Capillary: 110 mg/dL — ABNORMAL HIGH (ref 70–99)
Glucose-Capillary: 173 mg/dL — ABNORMAL HIGH (ref 70–99)

## 2014-02-12 LAB — GLUCOSE, CAPILLARY
GLUCOSE-CAPILLARY: 152 mg/dL — AB (ref 70–99)
GLUCOSE-CAPILLARY: 206 mg/dL — AB (ref 70–99)

## 2014-02-13 LAB — GLUCOSE, CAPILLARY
GLUCOSE-CAPILLARY: 149 mg/dL — AB (ref 70–99)
Glucose-Capillary: 129 mg/dL — ABNORMAL HIGH (ref 70–99)

## 2014-02-14 LAB — GLUCOSE, CAPILLARY
GLUCOSE-CAPILLARY: 144 mg/dL — AB (ref 70–99)
GLUCOSE-CAPILLARY: 192 mg/dL — AB (ref 70–99)

## 2014-02-15 LAB — GLUCOSE, CAPILLARY: Glucose-Capillary: 134 mg/dL — ABNORMAL HIGH (ref 70–99)

## 2014-02-16 LAB — GLUCOSE, CAPILLARY
GLUCOSE-CAPILLARY: 128 mg/dL — AB (ref 70–99)
GLUCOSE-CAPILLARY: 128 mg/dL — AB (ref 70–99)
Glucose-Capillary: 132 mg/dL — ABNORMAL HIGH (ref 70–99)

## 2014-02-17 LAB — GLUCOSE, CAPILLARY: Glucose-Capillary: 128 mg/dL — ABNORMAL HIGH (ref 70–99)

## 2014-02-18 LAB — GLUCOSE, CAPILLARY
GLUCOSE-CAPILLARY: 160 mg/dL — AB (ref 70–99)
Glucose-Capillary: 120 mg/dL — ABNORMAL HIGH (ref 70–99)

## 2014-02-19 LAB — GLUCOSE, CAPILLARY
GLUCOSE-CAPILLARY: 174 mg/dL — AB (ref 70–99)
Glucose-Capillary: 130 mg/dL — ABNORMAL HIGH (ref 70–99)

## 2014-02-20 LAB — GLUCOSE, CAPILLARY
Glucose-Capillary: 122 mg/dL — ABNORMAL HIGH (ref 70–99)
Glucose-Capillary: 146 mg/dL — ABNORMAL HIGH (ref 70–99)

## 2014-02-21 LAB — GLUCOSE, CAPILLARY
Glucose-Capillary: 123 mg/dL — ABNORMAL HIGH (ref 70–99)
Glucose-Capillary: 163 mg/dL — ABNORMAL HIGH (ref 70–99)

## 2014-02-22 LAB — GLUCOSE, CAPILLARY
GLUCOSE-CAPILLARY: 135 mg/dL — AB (ref 70–99)
Glucose-Capillary: 197 mg/dL — ABNORMAL HIGH (ref 70–99)

## 2014-02-23 LAB — GLUCOSE, CAPILLARY
GLUCOSE-CAPILLARY: 143 mg/dL — AB (ref 70–99)
Glucose-Capillary: 187 mg/dL — ABNORMAL HIGH (ref 70–99)

## 2014-02-24 LAB — GLUCOSE, CAPILLARY
GLUCOSE-CAPILLARY: 134 mg/dL — AB (ref 70–99)
GLUCOSE-CAPILLARY: 169 mg/dL — AB (ref 70–99)

## 2014-02-25 LAB — GLUCOSE, CAPILLARY
GLUCOSE-CAPILLARY: 146 mg/dL — AB (ref 70–99)
GLUCOSE-CAPILLARY: 223 mg/dL — AB (ref 70–99)

## 2014-02-26 LAB — GLUCOSE, CAPILLARY
Glucose-Capillary: 129 mg/dL — ABNORMAL HIGH (ref 70–99)
Glucose-Capillary: 200 mg/dL — ABNORMAL HIGH (ref 70–99)

## 2014-02-27 LAB — GLUCOSE, CAPILLARY: Glucose-Capillary: 145 mg/dL — ABNORMAL HIGH (ref 70–99)

## 2014-02-28 LAB — GLUCOSE, CAPILLARY
GLUCOSE-CAPILLARY: 145 mg/dL — AB (ref 70–99)
Glucose-Capillary: 129 mg/dL — ABNORMAL HIGH (ref 70–99)
Glucose-Capillary: 161 mg/dL — ABNORMAL HIGH (ref 70–99)

## 2014-03-01 LAB — GLUCOSE, CAPILLARY
Glucose-Capillary: 105 mg/dL — ABNORMAL HIGH (ref 70–99)
Glucose-Capillary: 148 mg/dL — ABNORMAL HIGH (ref 70–99)

## 2014-03-02 LAB — GLUCOSE, CAPILLARY
GLUCOSE-CAPILLARY: 220 mg/dL — AB (ref 70–99)
Glucose-Capillary: 129 mg/dL — ABNORMAL HIGH (ref 70–99)

## 2014-03-03 LAB — GLUCOSE, CAPILLARY: GLUCOSE-CAPILLARY: 153 mg/dL — AB (ref 70–99)

## 2014-03-04 LAB — GLUCOSE, CAPILLARY
GLUCOSE-CAPILLARY: 200 mg/dL — AB (ref 70–99)
Glucose-Capillary: 115 mg/dL — ABNORMAL HIGH (ref 70–99)

## 2014-03-05 LAB — GLUCOSE, CAPILLARY
GLUCOSE-CAPILLARY: 133 mg/dL — AB (ref 70–99)
Glucose-Capillary: 222 mg/dL — ABNORMAL HIGH (ref 70–99)

## 2014-03-06 LAB — GLUCOSE, CAPILLARY
GLUCOSE-CAPILLARY: 137 mg/dL — AB (ref 70–99)
Glucose-Capillary: 175 mg/dL — ABNORMAL HIGH (ref 70–99)

## 2014-03-07 LAB — GLUCOSE, CAPILLARY: Glucose-Capillary: 145 mg/dL — ABNORMAL HIGH (ref 70–99)

## 2014-03-08 LAB — GLUCOSE, CAPILLARY
GLUCOSE-CAPILLARY: 172 mg/dL — AB (ref 70–99)
Glucose-Capillary: 146 mg/dL — ABNORMAL HIGH (ref 70–99)
Glucose-Capillary: 209 mg/dL — ABNORMAL HIGH (ref 70–99)

## 2014-03-09 LAB — GLUCOSE, CAPILLARY
GLUCOSE-CAPILLARY: 141 mg/dL — AB (ref 70–99)
Glucose-Capillary: 190 mg/dL — ABNORMAL HIGH (ref 70–99)

## 2014-03-10 LAB — GLUCOSE, CAPILLARY: Glucose-Capillary: 123 mg/dL — ABNORMAL HIGH (ref 70–99)

## 2014-03-11 LAB — GLUCOSE, CAPILLARY
GLUCOSE-CAPILLARY: 112 mg/dL — AB (ref 70–99)
GLUCOSE-CAPILLARY: 144 mg/dL — AB (ref 70–99)

## 2014-03-12 LAB — GLUCOSE, CAPILLARY
Glucose-Capillary: 143 mg/dL — ABNORMAL HIGH (ref 70–99)
Glucose-Capillary: 193 mg/dL — ABNORMAL HIGH (ref 70–99)

## 2014-03-13 LAB — GLUCOSE, CAPILLARY: Glucose-Capillary: 136 mg/dL — ABNORMAL HIGH (ref 70–99)

## 2014-03-14 LAB — GLUCOSE, CAPILLARY
Glucose-Capillary: 176 mg/dL — ABNORMAL HIGH (ref 70–99)
Glucose-Capillary: 119 mg/dL — ABNORMAL HIGH (ref 70–99)
Glucose-Capillary: 176 mg/dL — ABNORMAL HIGH (ref 70–99)

## 2014-03-15 LAB — GLUCOSE, CAPILLARY
GLUCOSE-CAPILLARY: 123 mg/dL — AB (ref 70–99)
Glucose-Capillary: 156 mg/dL — ABNORMAL HIGH (ref 70–99)

## 2014-03-16 LAB — GLUCOSE, CAPILLARY: Glucose-Capillary: 206 mg/dL — ABNORMAL HIGH (ref 70–99)

## 2014-03-17 LAB — GLUCOSE, CAPILLARY
GLUCOSE-CAPILLARY: 137 mg/dL — AB (ref 70–99)
GLUCOSE-CAPILLARY: 256 mg/dL — AB (ref 70–99)
Glucose-Capillary: 214 mg/dL — ABNORMAL HIGH (ref 70–99)

## 2014-03-18 LAB — GLUCOSE, CAPILLARY
GLUCOSE-CAPILLARY: 96 mg/dL (ref 70–99)
Glucose-Capillary: 147 mg/dL — ABNORMAL HIGH (ref 70–99)

## 2014-03-19 LAB — GLUCOSE, CAPILLARY: Glucose-Capillary: 129 mg/dL — ABNORMAL HIGH (ref 70–99)

## 2014-03-20 LAB — GLUCOSE, CAPILLARY
GLUCOSE-CAPILLARY: 192 mg/dL — AB (ref 70–99)
GLUCOSE-CAPILLARY: 161 mg/dL — AB (ref 70–99)

## 2014-03-21 LAB — GLUCOSE, CAPILLARY
Glucose-Capillary: 160 mg/dL — ABNORMAL HIGH (ref 70–99)
Glucose-Capillary: 164 mg/dL — ABNORMAL HIGH (ref 70–99)

## 2014-03-22 LAB — GLUCOSE, CAPILLARY
GLUCOSE-CAPILLARY: 125 mg/dL — AB (ref 70–99)
Glucose-Capillary: 187 mg/dL — ABNORMAL HIGH (ref 70–99)

## 2014-03-23 LAB — GLUCOSE, CAPILLARY
Glucose-Capillary: 117 mg/dL — ABNORMAL HIGH (ref 70–99)
Glucose-Capillary: 129 mg/dL — ABNORMAL HIGH (ref 70–99)

## 2014-03-24 LAB — GLUCOSE, CAPILLARY
GLUCOSE-CAPILLARY: 158 mg/dL — AB (ref 70–99)
Glucose-Capillary: 114 mg/dL — ABNORMAL HIGH (ref 70–99)

## 2014-03-25 LAB — GLUCOSE, CAPILLARY
GLUCOSE-CAPILLARY: 134 mg/dL — AB (ref 70–99)
Glucose-Capillary: 165 mg/dL — ABNORMAL HIGH (ref 70–99)

## 2014-03-26 LAB — GLUCOSE, CAPILLARY
Glucose-Capillary: 135 mg/dL — ABNORMAL HIGH (ref 70–99)
Glucose-Capillary: 140 mg/dL — ABNORMAL HIGH (ref 70–99)

## 2014-03-27 LAB — GLUCOSE, CAPILLARY
GLUCOSE-CAPILLARY: 118 mg/dL — AB (ref 70–99)
Glucose-Capillary: 167 mg/dL — ABNORMAL HIGH (ref 70–99)

## 2014-03-28 LAB — GLUCOSE, CAPILLARY
GLUCOSE-CAPILLARY: 140 mg/dL — AB (ref 70–99)
Glucose-Capillary: 193 mg/dL — ABNORMAL HIGH (ref 70–99)

## 2014-03-29 LAB — GLUCOSE, CAPILLARY
GLUCOSE-CAPILLARY: 133 mg/dL — AB (ref 70–99)
GLUCOSE-CAPILLARY: 204 mg/dL — AB (ref 70–99)

## 2014-03-30 LAB — GLUCOSE, CAPILLARY
GLUCOSE-CAPILLARY: 139 mg/dL — AB (ref 70–99)
GLUCOSE-CAPILLARY: 184 mg/dL — AB (ref 70–99)

## 2014-03-31 LAB — GLUCOSE, CAPILLARY
GLUCOSE-CAPILLARY: 132 mg/dL — AB (ref 70–99)
GLUCOSE-CAPILLARY: 135 mg/dL — AB (ref 70–99)

## 2014-04-01 DIAGNOSIS — Z992 Dependence on renal dialysis: Secondary | ICD-10-CM | POA: Diagnosis not present

## 2014-04-01 DIAGNOSIS — N186 End stage renal disease: Secondary | ICD-10-CM | POA: Diagnosis not present

## 2014-04-01 DIAGNOSIS — D509 Iron deficiency anemia, unspecified: Secondary | ICD-10-CM | POA: Diagnosis not present

## 2014-04-01 DIAGNOSIS — D631 Anemia in chronic kidney disease: Secondary | ICD-10-CM | POA: Diagnosis not present

## 2014-04-01 LAB — GLUCOSE, CAPILLARY
GLUCOSE-CAPILLARY: 156 mg/dL — AB (ref 70–99)
Glucose-Capillary: 108 mg/dL — ABNORMAL HIGH (ref 70–99)

## 2014-04-02 LAB — GLUCOSE, CAPILLARY
Glucose-Capillary: 101 mg/dL — ABNORMAL HIGH (ref 70–99)
Glucose-Capillary: 162 mg/dL — ABNORMAL HIGH (ref 70–99)

## 2014-04-03 LAB — GLUCOSE, CAPILLARY
Glucose-Capillary: 130 mg/dL — ABNORMAL HIGH (ref 70–99)
Glucose-Capillary: 176 mg/dL — ABNORMAL HIGH (ref 70–99)

## 2014-04-04 DIAGNOSIS — Z992 Dependence on renal dialysis: Secondary | ICD-10-CM | POA: Diagnosis not present

## 2014-04-04 DIAGNOSIS — D631 Anemia in chronic kidney disease: Secondary | ICD-10-CM | POA: Diagnosis not present

## 2014-04-04 DIAGNOSIS — D509 Iron deficiency anemia, unspecified: Secondary | ICD-10-CM | POA: Diagnosis not present

## 2014-04-04 DIAGNOSIS — N186 End stage renal disease: Secondary | ICD-10-CM | POA: Diagnosis not present

## 2014-04-04 LAB — GLUCOSE, CAPILLARY: GLUCOSE-CAPILLARY: 126 mg/dL — AB (ref 70–99)

## 2014-04-05 LAB — GLUCOSE, CAPILLARY
Glucose-Capillary: 123 mg/dL — ABNORMAL HIGH (ref 70–99)
Glucose-Capillary: 169 mg/dL — ABNORMAL HIGH (ref 70–99)

## 2014-04-06 DIAGNOSIS — D631 Anemia in chronic kidney disease: Secondary | ICD-10-CM | POA: Diagnosis not present

## 2014-04-06 DIAGNOSIS — N186 End stage renal disease: Secondary | ICD-10-CM | POA: Diagnosis not present

## 2014-04-06 DIAGNOSIS — D509 Iron deficiency anemia, unspecified: Secondary | ICD-10-CM | POA: Diagnosis not present

## 2014-04-06 DIAGNOSIS — Z992 Dependence on renal dialysis: Secondary | ICD-10-CM | POA: Diagnosis not present

## 2014-04-06 LAB — GLUCOSE, CAPILLARY
Glucose-Capillary: 138 mg/dL — ABNORMAL HIGH (ref 70–99)
Glucose-Capillary: 144 mg/dL — ABNORMAL HIGH (ref 70–99)

## 2014-04-07 LAB — GLUCOSE, CAPILLARY: Glucose-Capillary: 132 mg/dL — ABNORMAL HIGH (ref 70–99)

## 2014-04-08 DIAGNOSIS — N186 End stage renal disease: Secondary | ICD-10-CM | POA: Diagnosis not present

## 2014-04-08 DIAGNOSIS — Z992 Dependence on renal dialysis: Secondary | ICD-10-CM | POA: Diagnosis not present

## 2014-04-08 DIAGNOSIS — D631 Anemia in chronic kidney disease: Secondary | ICD-10-CM | POA: Diagnosis not present

## 2014-04-08 DIAGNOSIS — D509 Iron deficiency anemia, unspecified: Secondary | ICD-10-CM | POA: Diagnosis not present

## 2014-04-08 LAB — GLUCOSE, CAPILLARY
Glucose-Capillary: 117 mg/dL — ABNORMAL HIGH (ref 70–99)
Glucose-Capillary: 167 mg/dL — ABNORMAL HIGH (ref 70–99)

## 2014-04-09 LAB — GLUCOSE, CAPILLARY
GLUCOSE-CAPILLARY: 140 mg/dL — AB (ref 70–99)
Glucose-Capillary: 158 mg/dL — ABNORMAL HIGH (ref 70–99)

## 2014-04-10 ENCOUNTER — Ambulatory Visit: Payer: Self-pay | Admitting: Vascular Surgery

## 2014-04-10 DIAGNOSIS — Z809 Family history of malignant neoplasm, unspecified: Secondary | ICD-10-CM | POA: Diagnosis not present

## 2014-04-10 DIAGNOSIS — Z8673 Personal history of transient ischemic attack (TIA), and cerebral infarction without residual deficits: Secondary | ICD-10-CM | POA: Diagnosis not present

## 2014-04-10 DIAGNOSIS — I120 Hypertensive chronic kidney disease with stage 5 chronic kidney disease or end stage renal disease: Secondary | ICD-10-CM | POA: Diagnosis not present

## 2014-04-10 DIAGNOSIS — E119 Type 2 diabetes mellitus without complications: Secondary | ICD-10-CM | POA: Diagnosis not present

## 2014-04-10 DIAGNOSIS — N186 End stage renal disease: Secondary | ICD-10-CM | POA: Diagnosis not present

## 2014-04-10 DIAGNOSIS — Z4901 Encounter for fitting and adjustment of extracorporeal dialysis catheter: Secondary | ICD-10-CM | POA: Diagnosis not present

## 2014-04-10 DIAGNOSIS — Z833 Family history of diabetes mellitus: Secondary | ICD-10-CM | POA: Diagnosis not present

## 2014-04-10 DIAGNOSIS — Z79899 Other long term (current) drug therapy: Secondary | ICD-10-CM | POA: Diagnosis not present

## 2014-04-10 DIAGNOSIS — Z992 Dependence on renal dialysis: Secondary | ICD-10-CM | POA: Diagnosis not present

## 2014-04-10 LAB — GLUCOSE, CAPILLARY
GLUCOSE-CAPILLARY: 118 mg/dL — AB (ref 70–99)
Glucose-Capillary: 172 mg/dL — ABNORMAL HIGH (ref 70–99)

## 2014-04-11 DIAGNOSIS — N186 End stage renal disease: Secondary | ICD-10-CM | POA: Diagnosis not present

## 2014-04-11 DIAGNOSIS — D509 Iron deficiency anemia, unspecified: Secondary | ICD-10-CM | POA: Diagnosis not present

## 2014-04-11 DIAGNOSIS — Z992 Dependence on renal dialysis: Secondary | ICD-10-CM | POA: Diagnosis not present

## 2014-04-11 DIAGNOSIS — D631 Anemia in chronic kidney disease: Secondary | ICD-10-CM | POA: Diagnosis not present

## 2014-04-11 LAB — GLUCOSE, CAPILLARY
Glucose-Capillary: 127 mg/dL — ABNORMAL HIGH (ref 70–99)
Glucose-Capillary: 181 mg/dL — ABNORMAL HIGH (ref 70–99)

## 2014-04-12 LAB — GLUCOSE, CAPILLARY
GLUCOSE-CAPILLARY: 123 mg/dL — AB (ref 70–99)
Glucose-Capillary: 150 mg/dL — ABNORMAL HIGH (ref 70–99)

## 2014-04-13 DIAGNOSIS — N186 End stage renal disease: Secondary | ICD-10-CM | POA: Diagnosis not present

## 2014-04-13 DIAGNOSIS — Z992 Dependence on renal dialysis: Secondary | ICD-10-CM | POA: Diagnosis not present

## 2014-04-13 DIAGNOSIS — D631 Anemia in chronic kidney disease: Secondary | ICD-10-CM | POA: Diagnosis not present

## 2014-04-13 DIAGNOSIS — D509 Iron deficiency anemia, unspecified: Secondary | ICD-10-CM | POA: Diagnosis not present

## 2014-04-13 LAB — GLUCOSE, CAPILLARY
GLUCOSE-CAPILLARY: 126 mg/dL — AB (ref 70–99)
Glucose-Capillary: 181 mg/dL — ABNORMAL HIGH (ref 70–99)

## 2014-04-14 LAB — GLUCOSE, CAPILLARY: GLUCOSE-CAPILLARY: 141 mg/dL — AB (ref 70–99)

## 2014-04-15 DIAGNOSIS — Z992 Dependence on renal dialysis: Secondary | ICD-10-CM | POA: Diagnosis not present

## 2014-04-15 DIAGNOSIS — D509 Iron deficiency anemia, unspecified: Secondary | ICD-10-CM | POA: Diagnosis not present

## 2014-04-15 DIAGNOSIS — N186 End stage renal disease: Secondary | ICD-10-CM | POA: Diagnosis not present

## 2014-04-15 DIAGNOSIS — D631 Anemia in chronic kidney disease: Secondary | ICD-10-CM | POA: Diagnosis not present

## 2014-04-15 LAB — GLUCOSE, CAPILLARY
GLUCOSE-CAPILLARY: 178 mg/dL — AB (ref 70–99)
Glucose-Capillary: 97 mg/dL (ref 70–99)

## 2014-04-16 LAB — GLUCOSE, CAPILLARY
Glucose-Capillary: 149 mg/dL — ABNORMAL HIGH (ref 70–99)
Glucose-Capillary: 234 mg/dL — ABNORMAL HIGH (ref 70–99)

## 2014-04-17 ENCOUNTER — Ambulatory Visit (HOSPITAL_COMMUNITY): Payer: Medicare Other | Attending: Internal Medicine

## 2014-04-17 DIAGNOSIS — R05 Cough: Secondary | ICD-10-CM | POA: Diagnosis not present

## 2014-04-17 DIAGNOSIS — R0989 Other specified symptoms and signs involving the circulatory and respiratory systems: Secondary | ICD-10-CM | POA: Diagnosis not present

## 2014-04-17 DIAGNOSIS — Z951 Presence of aortocoronary bypass graft: Secondary | ICD-10-CM | POA: Insufficient documentation

## 2014-04-17 DIAGNOSIS — I517 Cardiomegaly: Secondary | ICD-10-CM | POA: Diagnosis not present

## 2014-04-17 LAB — GLUCOSE, CAPILLARY
Glucose-Capillary: 117 mg/dL — ABNORMAL HIGH (ref 70–99)
Glucose-Capillary: 165 mg/dL — ABNORMAL HIGH (ref 70–99)

## 2014-04-18 DIAGNOSIS — D631 Anemia in chronic kidney disease: Secondary | ICD-10-CM | POA: Diagnosis not present

## 2014-04-18 DIAGNOSIS — Z992 Dependence on renal dialysis: Secondary | ICD-10-CM | POA: Diagnosis not present

## 2014-04-18 DIAGNOSIS — N186 End stage renal disease: Secondary | ICD-10-CM | POA: Diagnosis not present

## 2014-04-18 DIAGNOSIS — D509 Iron deficiency anemia, unspecified: Secondary | ICD-10-CM | POA: Diagnosis not present

## 2014-04-18 LAB — GLUCOSE, CAPILLARY
Glucose-Capillary: 148 mg/dL — ABNORMAL HIGH (ref 70–99)
Glucose-Capillary: 186 mg/dL — ABNORMAL HIGH (ref 70–99)

## 2014-04-19 LAB — GLUCOSE, CAPILLARY
GLUCOSE-CAPILLARY: 174 mg/dL — AB (ref 70–99)
Glucose-Capillary: 113 mg/dL — ABNORMAL HIGH (ref 70–99)

## 2014-04-20 DIAGNOSIS — D509 Iron deficiency anemia, unspecified: Secondary | ICD-10-CM | POA: Diagnosis not present

## 2014-04-20 DIAGNOSIS — D631 Anemia in chronic kidney disease: Secondary | ICD-10-CM | POA: Diagnosis not present

## 2014-04-20 DIAGNOSIS — N186 End stage renal disease: Secondary | ICD-10-CM | POA: Diagnosis not present

## 2014-04-20 DIAGNOSIS — Z992 Dependence on renal dialysis: Secondary | ICD-10-CM | POA: Diagnosis not present

## 2014-04-20 LAB — GLUCOSE, CAPILLARY
Glucose-Capillary: 136 mg/dL — ABNORMAL HIGH (ref 70–99)
Glucose-Capillary: 150 mg/dL — ABNORMAL HIGH (ref 70–99)

## 2014-04-21 LAB — GLUCOSE, CAPILLARY
GLUCOSE-CAPILLARY: 192 mg/dL — AB (ref 70–99)
Glucose-Capillary: 134 mg/dL — ABNORMAL HIGH (ref 70–99)

## 2014-04-22 LAB — GLUCOSE, CAPILLARY: GLUCOSE-CAPILLARY: 146 mg/dL — AB (ref 70–99)

## 2014-04-23 DIAGNOSIS — N186 End stage renal disease: Secondary | ICD-10-CM | POA: Diagnosis not present

## 2014-04-23 DIAGNOSIS — Z992 Dependence on renal dialysis: Secondary | ICD-10-CM | POA: Diagnosis not present

## 2014-04-23 DIAGNOSIS — D509 Iron deficiency anemia, unspecified: Secondary | ICD-10-CM | POA: Diagnosis not present

## 2014-04-23 DIAGNOSIS — D631 Anemia in chronic kidney disease: Secondary | ICD-10-CM | POA: Diagnosis not present

## 2014-04-23 LAB — GLUCOSE, CAPILLARY
GLUCOSE-CAPILLARY: 192 mg/dL — AB (ref 70–99)
GLUCOSE-CAPILLARY: 136 mg/dL — AB (ref 70–99)
Glucose-Capillary: 185 mg/dL — ABNORMAL HIGH (ref 70–99)

## 2014-04-24 LAB — GLUCOSE, CAPILLARY
Glucose-Capillary: 147 mg/dL — ABNORMAL HIGH (ref 70–99)
Glucose-Capillary: 145 mg/dL — ABNORMAL HIGH (ref 70–99)

## 2014-04-25 DIAGNOSIS — D509 Iron deficiency anemia, unspecified: Secondary | ICD-10-CM | POA: Diagnosis not present

## 2014-04-25 DIAGNOSIS — D631 Anemia in chronic kidney disease: Secondary | ICD-10-CM | POA: Diagnosis not present

## 2014-04-25 DIAGNOSIS — N186 End stage renal disease: Secondary | ICD-10-CM | POA: Diagnosis not present

## 2014-04-25 DIAGNOSIS — Z992 Dependence on renal dialysis: Secondary | ICD-10-CM | POA: Diagnosis not present

## 2014-04-25 LAB — GLUCOSE, CAPILLARY
GLUCOSE-CAPILLARY: 134 mg/dL — AB (ref 70–99)
Glucose-Capillary: 183 mg/dL — ABNORMAL HIGH (ref 70–99)

## 2014-04-26 LAB — GLUCOSE, CAPILLARY
GLUCOSE-CAPILLARY: 172 mg/dL — AB (ref 70–99)
Glucose-Capillary: 110 mg/dL — ABNORMAL HIGH (ref 70–99)

## 2014-04-27 DIAGNOSIS — N186 End stage renal disease: Secondary | ICD-10-CM | POA: Diagnosis not present

## 2014-04-27 DIAGNOSIS — D631 Anemia in chronic kidney disease: Secondary | ICD-10-CM | POA: Diagnosis not present

## 2014-04-27 DIAGNOSIS — D509 Iron deficiency anemia, unspecified: Secondary | ICD-10-CM | POA: Diagnosis not present

## 2014-04-27 DIAGNOSIS — Z992 Dependence on renal dialysis: Secondary | ICD-10-CM | POA: Diagnosis not present

## 2014-04-27 LAB — GLUCOSE, CAPILLARY
Glucose-Capillary: 97 mg/dL (ref 70–99)
Glucose-Capillary: 167 mg/dL — ABNORMAL HIGH (ref 70–99)

## 2014-04-28 LAB — GLUCOSE, CAPILLARY: Glucose-Capillary: 136 mg/dL — ABNORMAL HIGH (ref 70–99)

## 2014-04-29 DIAGNOSIS — Z992 Dependence on renal dialysis: Secondary | ICD-10-CM | POA: Diagnosis not present

## 2014-04-29 DIAGNOSIS — N186 End stage renal disease: Secondary | ICD-10-CM | POA: Diagnosis not present

## 2014-04-29 DIAGNOSIS — D631 Anemia in chronic kidney disease: Secondary | ICD-10-CM | POA: Diagnosis not present

## 2014-04-29 DIAGNOSIS — D509 Iron deficiency anemia, unspecified: Secondary | ICD-10-CM | POA: Diagnosis not present

## 2014-04-29 LAB — GLUCOSE, CAPILLARY
GLUCOSE-CAPILLARY: 176 mg/dL — AB (ref 70–99)
Glucose-Capillary: 118 mg/dL — ABNORMAL HIGH (ref 70–99)

## 2014-04-30 DIAGNOSIS — Z992 Dependence on renal dialysis: Secondary | ICD-10-CM | POA: Diagnosis not present

## 2014-04-30 DIAGNOSIS — N186 End stage renal disease: Secondary | ICD-10-CM | POA: Diagnosis not present

## 2014-04-30 LAB — GLUCOSE, CAPILLARY
GLUCOSE-CAPILLARY: 120 mg/dL — AB (ref 70–99)
GLUCOSE-CAPILLARY: 217 mg/dL — AB (ref 70–99)

## 2014-05-01 DIAGNOSIS — N186 End stage renal disease: Secondary | ICD-10-CM | POA: Diagnosis not present

## 2014-05-01 DIAGNOSIS — I10 Essential (primary) hypertension: Secondary | ICD-10-CM | POA: Diagnosis not present

## 2014-05-01 DIAGNOSIS — M79642 Pain in left hand: Secondary | ICD-10-CM | POA: Diagnosis not present

## 2014-05-01 DIAGNOSIS — F068 Other specified mental disorders due to known physiological condition: Secondary | ICD-10-CM | POA: Diagnosis not present

## 2014-05-01 LAB — GLUCOSE, CAPILLARY
GLUCOSE-CAPILLARY: 150 mg/dL — AB (ref 70–99)
GLUCOSE-CAPILLARY: 169 mg/dL — AB (ref 70–99)

## 2014-05-02 DIAGNOSIS — D509 Iron deficiency anemia, unspecified: Secondary | ICD-10-CM | POA: Diagnosis not present

## 2014-05-02 DIAGNOSIS — D631 Anemia in chronic kidney disease: Secondary | ICD-10-CM | POA: Diagnosis not present

## 2014-05-02 DIAGNOSIS — Z9181 History of falling: Secondary | ICD-10-CM | POA: Diagnosis not present

## 2014-05-02 DIAGNOSIS — N189 Chronic kidney disease, unspecified: Secondary | ICD-10-CM | POA: Diagnosis not present

## 2014-05-02 DIAGNOSIS — N186 End stage renal disease: Secondary | ICD-10-CM | POA: Diagnosis not present

## 2014-05-02 DIAGNOSIS — Z992 Dependence on renal dialysis: Secondary | ICD-10-CM | POA: Diagnosis not present

## 2014-05-02 DIAGNOSIS — M6281 Muscle weakness (generalized): Secondary | ICD-10-CM | POA: Diagnosis not present

## 2014-05-02 DIAGNOSIS — R279 Unspecified lack of coordination: Secondary | ICD-10-CM | POA: Diagnosis not present

## 2014-05-02 LAB — GLUCOSE, CAPILLARY
Glucose-Capillary: 122 mg/dL — ABNORMAL HIGH (ref 70–99)
Glucose-Capillary: 206 mg/dL — ABNORMAL HIGH (ref 70–99)

## 2014-05-03 DIAGNOSIS — N189 Chronic kidney disease, unspecified: Secondary | ICD-10-CM | POA: Diagnosis not present

## 2014-05-03 DIAGNOSIS — R279 Unspecified lack of coordination: Secondary | ICD-10-CM | POA: Diagnosis not present

## 2014-05-03 DIAGNOSIS — M6281 Muscle weakness (generalized): Secondary | ICD-10-CM | POA: Diagnosis not present

## 2014-05-03 DIAGNOSIS — Z9181 History of falling: Secondary | ICD-10-CM | POA: Diagnosis not present

## 2014-05-03 LAB — GLUCOSE, CAPILLARY
GLUCOSE-CAPILLARY: 169 mg/dL — AB (ref 70–99)
Glucose-Capillary: 130 mg/dL — ABNORMAL HIGH (ref 70–99)

## 2014-05-04 DIAGNOSIS — D631 Anemia in chronic kidney disease: Secondary | ICD-10-CM | POA: Diagnosis not present

## 2014-05-04 DIAGNOSIS — D509 Iron deficiency anemia, unspecified: Secondary | ICD-10-CM | POA: Diagnosis not present

## 2014-05-04 DIAGNOSIS — R279 Unspecified lack of coordination: Secondary | ICD-10-CM | POA: Diagnosis not present

## 2014-05-04 DIAGNOSIS — Z9181 History of falling: Secondary | ICD-10-CM | POA: Diagnosis not present

## 2014-05-04 DIAGNOSIS — N186 End stage renal disease: Secondary | ICD-10-CM | POA: Diagnosis not present

## 2014-05-04 DIAGNOSIS — M6281 Muscle weakness (generalized): Secondary | ICD-10-CM | POA: Diagnosis not present

## 2014-05-04 DIAGNOSIS — N189 Chronic kidney disease, unspecified: Secondary | ICD-10-CM | POA: Diagnosis not present

## 2014-05-04 DIAGNOSIS — Z992 Dependence on renal dialysis: Secondary | ICD-10-CM | POA: Diagnosis not present

## 2014-05-04 LAB — GLUCOSE, CAPILLARY
Glucose-Capillary: 130 mg/dL — ABNORMAL HIGH (ref 70–99)
Glucose-Capillary: 239 mg/dL — ABNORMAL HIGH (ref 70–99)

## 2014-05-05 DIAGNOSIS — N189 Chronic kidney disease, unspecified: Secondary | ICD-10-CM | POA: Diagnosis not present

## 2014-05-05 DIAGNOSIS — M6281 Muscle weakness (generalized): Secondary | ICD-10-CM | POA: Diagnosis not present

## 2014-05-05 DIAGNOSIS — R279 Unspecified lack of coordination: Secondary | ICD-10-CM | POA: Diagnosis not present

## 2014-05-05 DIAGNOSIS — Z9181 History of falling: Secondary | ICD-10-CM | POA: Diagnosis not present

## 2014-05-05 LAB — GLUCOSE, CAPILLARY: Glucose-Capillary: 152 mg/dL — ABNORMAL HIGH (ref 70–99)

## 2014-05-06 DIAGNOSIS — N186 End stage renal disease: Secondary | ICD-10-CM | POA: Diagnosis not present

## 2014-05-06 DIAGNOSIS — Z992 Dependence on renal dialysis: Secondary | ICD-10-CM | POA: Diagnosis not present

## 2014-05-06 DIAGNOSIS — D509 Iron deficiency anemia, unspecified: Secondary | ICD-10-CM | POA: Diagnosis not present

## 2014-05-06 DIAGNOSIS — D631 Anemia in chronic kidney disease: Secondary | ICD-10-CM | POA: Diagnosis not present

## 2014-05-06 LAB — GLUCOSE, CAPILLARY
GLUCOSE-CAPILLARY: 201 mg/dL — AB (ref 70–99)
Glucose-Capillary: 93 mg/dL (ref 70–99)

## 2014-05-07 LAB — GLUCOSE, CAPILLARY
GLUCOSE-CAPILLARY: 226 mg/dL — AB (ref 70–99)
Glucose-Capillary: 112 mg/dL — ABNORMAL HIGH (ref 70–99)

## 2014-05-08 DIAGNOSIS — M6281 Muscle weakness (generalized): Secondary | ICD-10-CM | POA: Diagnosis not present

## 2014-05-08 DIAGNOSIS — N189 Chronic kidney disease, unspecified: Secondary | ICD-10-CM | POA: Diagnosis not present

## 2014-05-08 DIAGNOSIS — Z9181 History of falling: Secondary | ICD-10-CM | POA: Diagnosis not present

## 2014-05-08 DIAGNOSIS — R279 Unspecified lack of coordination: Secondary | ICD-10-CM | POA: Diagnosis not present

## 2014-05-08 LAB — GLUCOSE, CAPILLARY: GLUCOSE-CAPILLARY: 131 mg/dL — AB (ref 70–99)

## 2014-05-09 DIAGNOSIS — M6281 Muscle weakness (generalized): Secondary | ICD-10-CM | POA: Diagnosis not present

## 2014-05-09 DIAGNOSIS — Z9181 History of falling: Secondary | ICD-10-CM | POA: Diagnosis not present

## 2014-05-09 DIAGNOSIS — N189 Chronic kidney disease, unspecified: Secondary | ICD-10-CM | POA: Diagnosis not present

## 2014-05-09 DIAGNOSIS — N186 End stage renal disease: Secondary | ICD-10-CM | POA: Diagnosis not present

## 2014-05-09 DIAGNOSIS — D509 Iron deficiency anemia, unspecified: Secondary | ICD-10-CM | POA: Diagnosis not present

## 2014-05-09 DIAGNOSIS — D631 Anemia in chronic kidney disease: Secondary | ICD-10-CM | POA: Diagnosis not present

## 2014-05-09 DIAGNOSIS — Z992 Dependence on renal dialysis: Secondary | ICD-10-CM | POA: Diagnosis not present

## 2014-05-09 DIAGNOSIS — R279 Unspecified lack of coordination: Secondary | ICD-10-CM | POA: Diagnosis not present

## 2014-05-09 LAB — GLUCOSE, CAPILLARY: Glucose-Capillary: 154 mg/dL — ABNORMAL HIGH (ref 70–99)

## 2014-05-10 DIAGNOSIS — R279 Unspecified lack of coordination: Secondary | ICD-10-CM | POA: Diagnosis not present

## 2014-05-10 DIAGNOSIS — Z9181 History of falling: Secondary | ICD-10-CM | POA: Diagnosis not present

## 2014-05-10 DIAGNOSIS — M6281 Muscle weakness (generalized): Secondary | ICD-10-CM | POA: Diagnosis not present

## 2014-05-10 DIAGNOSIS — N189 Chronic kidney disease, unspecified: Secondary | ICD-10-CM | POA: Diagnosis not present

## 2014-05-10 LAB — GLUCOSE, CAPILLARY
Glucose-Capillary: 157 mg/dL — ABNORMAL HIGH (ref 70–99)
Glucose-Capillary: 123 mg/dL — ABNORMAL HIGH (ref 70–99)
Glucose-Capillary: 130 mg/dL — ABNORMAL HIGH (ref 70–99)
Glucose-Capillary: 121 mg/dL — ABNORMAL HIGH (ref 70–99)

## 2014-05-11 DIAGNOSIS — D509 Iron deficiency anemia, unspecified: Secondary | ICD-10-CM | POA: Diagnosis not present

## 2014-05-11 DIAGNOSIS — N189 Chronic kidney disease, unspecified: Secondary | ICD-10-CM | POA: Diagnosis not present

## 2014-05-11 DIAGNOSIS — B351 Tinea unguium: Secondary | ICD-10-CM | POA: Diagnosis not present

## 2014-05-11 DIAGNOSIS — D631 Anemia in chronic kidney disease: Secondary | ICD-10-CM | POA: Diagnosis not present

## 2014-05-11 DIAGNOSIS — M79675 Pain in left toe(s): Secondary | ICD-10-CM | POA: Diagnosis not present

## 2014-05-11 DIAGNOSIS — R279 Unspecified lack of coordination: Secondary | ICD-10-CM | POA: Diagnosis not present

## 2014-05-11 DIAGNOSIS — Z992 Dependence on renal dialysis: Secondary | ICD-10-CM | POA: Diagnosis not present

## 2014-05-11 DIAGNOSIS — M79674 Pain in right toe(s): Secondary | ICD-10-CM | POA: Diagnosis not present

## 2014-05-11 DIAGNOSIS — M6281 Muscle weakness (generalized): Secondary | ICD-10-CM | POA: Diagnosis not present

## 2014-05-11 DIAGNOSIS — Z9181 History of falling: Secondary | ICD-10-CM | POA: Diagnosis not present

## 2014-05-11 DIAGNOSIS — N186 End stage renal disease: Secondary | ICD-10-CM | POA: Diagnosis not present

## 2014-05-11 DIAGNOSIS — E119 Type 2 diabetes mellitus without complications: Secondary | ICD-10-CM | POA: Diagnosis not present

## 2014-05-11 DIAGNOSIS — R262 Difficulty in walking, not elsewhere classified: Secondary | ICD-10-CM | POA: Diagnosis not present

## 2014-05-11 LAB — GLUCOSE, CAPILLARY
GLUCOSE-CAPILLARY: 139 mg/dL — AB (ref 70–99)
GLUCOSE-CAPILLARY: 230 mg/dL — AB (ref 70–99)

## 2014-05-12 LAB — GLUCOSE, CAPILLARY: Glucose-Capillary: 131 mg/dL — ABNORMAL HIGH (ref 70–99)

## 2014-05-13 DIAGNOSIS — D631 Anemia in chronic kidney disease: Secondary | ICD-10-CM | POA: Diagnosis not present

## 2014-05-13 DIAGNOSIS — D509 Iron deficiency anemia, unspecified: Secondary | ICD-10-CM | POA: Diagnosis not present

## 2014-05-13 DIAGNOSIS — N186 End stage renal disease: Secondary | ICD-10-CM | POA: Diagnosis not present

## 2014-05-13 DIAGNOSIS — Z992 Dependence on renal dialysis: Secondary | ICD-10-CM | POA: Diagnosis not present

## 2014-05-13 LAB — GLUCOSE, CAPILLARY
Glucose-Capillary: 156 mg/dL — ABNORMAL HIGH (ref 70–99)
Glucose-Capillary: 178 mg/dL — ABNORMAL HIGH (ref 70–99)

## 2014-05-14 LAB — GLUCOSE, CAPILLARY
GLUCOSE-CAPILLARY: 195 mg/dL — AB (ref 70–99)
Glucose-Capillary: 119 mg/dL — ABNORMAL HIGH (ref 70–99)

## 2014-05-15 LAB — GLUCOSE, CAPILLARY
GLUCOSE-CAPILLARY: 146 mg/dL — AB (ref 70–99)
Glucose-Capillary: 131 mg/dL — ABNORMAL HIGH (ref 70–99)

## 2014-05-16 DIAGNOSIS — Z9181 History of falling: Secondary | ICD-10-CM | POA: Diagnosis not present

## 2014-05-16 DIAGNOSIS — R279 Unspecified lack of coordination: Secondary | ICD-10-CM | POA: Diagnosis not present

## 2014-05-16 DIAGNOSIS — M6281 Muscle weakness (generalized): Secondary | ICD-10-CM | POA: Diagnosis not present

## 2014-05-16 DIAGNOSIS — Z992 Dependence on renal dialysis: Secondary | ICD-10-CM | POA: Diagnosis not present

## 2014-05-16 DIAGNOSIS — D509 Iron deficiency anemia, unspecified: Secondary | ICD-10-CM | POA: Diagnosis not present

## 2014-05-16 DIAGNOSIS — N186 End stage renal disease: Secondary | ICD-10-CM | POA: Diagnosis not present

## 2014-05-16 DIAGNOSIS — N189 Chronic kidney disease, unspecified: Secondary | ICD-10-CM | POA: Diagnosis not present

## 2014-05-16 DIAGNOSIS — D631 Anemia in chronic kidney disease: Secondary | ICD-10-CM | POA: Diagnosis not present

## 2014-05-16 LAB — GLUCOSE, CAPILLARY
GLUCOSE-CAPILLARY: 130 mg/dL — AB (ref 70–99)
Glucose-Capillary: 172 mg/dL — ABNORMAL HIGH (ref 70–99)

## 2014-05-17 DIAGNOSIS — R279 Unspecified lack of coordination: Secondary | ICD-10-CM | POA: Diagnosis not present

## 2014-05-17 DIAGNOSIS — N189 Chronic kidney disease, unspecified: Secondary | ICD-10-CM | POA: Diagnosis not present

## 2014-05-17 DIAGNOSIS — Z9181 History of falling: Secondary | ICD-10-CM | POA: Diagnosis not present

## 2014-05-17 DIAGNOSIS — M6281 Muscle weakness (generalized): Secondary | ICD-10-CM | POA: Diagnosis not present

## 2014-05-17 LAB — GLUCOSE, CAPILLARY
Glucose-Capillary: 124 mg/dL — ABNORMAL HIGH (ref 70–99)
Glucose-Capillary: 233 mg/dL — ABNORMAL HIGH (ref 70–99)

## 2014-05-18 DIAGNOSIS — R279 Unspecified lack of coordination: Secondary | ICD-10-CM | POA: Diagnosis not present

## 2014-05-18 DIAGNOSIS — M6281 Muscle weakness (generalized): Secondary | ICD-10-CM | POA: Diagnosis not present

## 2014-05-18 DIAGNOSIS — D631 Anemia in chronic kidney disease: Secondary | ICD-10-CM | POA: Diagnosis not present

## 2014-05-18 DIAGNOSIS — D509 Iron deficiency anemia, unspecified: Secondary | ICD-10-CM | POA: Diagnosis not present

## 2014-05-18 DIAGNOSIS — Z992 Dependence on renal dialysis: Secondary | ICD-10-CM | POA: Diagnosis not present

## 2014-05-18 DIAGNOSIS — N186 End stage renal disease: Secondary | ICD-10-CM | POA: Diagnosis not present

## 2014-05-18 DIAGNOSIS — N189 Chronic kidney disease, unspecified: Secondary | ICD-10-CM | POA: Diagnosis not present

## 2014-05-18 DIAGNOSIS — Z9181 History of falling: Secondary | ICD-10-CM | POA: Diagnosis not present

## 2014-05-18 LAB — GLUCOSE, CAPILLARY
Glucose-Capillary: 134 mg/dL — ABNORMAL HIGH (ref 70–99)
Glucose-Capillary: 177 mg/dL — ABNORMAL HIGH (ref 70–99)

## 2014-05-19 LAB — GLUCOSE, CAPILLARY: GLUCOSE-CAPILLARY: 133 mg/dL — AB (ref 70–99)

## 2014-05-20 DIAGNOSIS — D509 Iron deficiency anemia, unspecified: Secondary | ICD-10-CM | POA: Diagnosis not present

## 2014-05-20 DIAGNOSIS — N186 End stage renal disease: Secondary | ICD-10-CM | POA: Diagnosis not present

## 2014-05-20 DIAGNOSIS — D631 Anemia in chronic kidney disease: Secondary | ICD-10-CM | POA: Diagnosis not present

## 2014-05-20 DIAGNOSIS — Z992 Dependence on renal dialysis: Secondary | ICD-10-CM | POA: Diagnosis not present

## 2014-05-20 LAB — GLUCOSE, CAPILLARY
Glucose-Capillary: 143 mg/dL — ABNORMAL HIGH (ref 70–99)
Glucose-Capillary: 206 mg/dL — ABNORMAL HIGH (ref 70–99)

## 2014-05-21 LAB — GLUCOSE, CAPILLARY
Glucose-Capillary: 120 mg/dL — ABNORMAL HIGH (ref 70–99)
Glucose-Capillary: 163 mg/dL — ABNORMAL HIGH (ref 70–99)

## 2014-05-22 DIAGNOSIS — R279 Unspecified lack of coordination: Secondary | ICD-10-CM | POA: Diagnosis not present

## 2014-05-22 DIAGNOSIS — M6281 Muscle weakness (generalized): Secondary | ICD-10-CM | POA: Diagnosis not present

## 2014-05-22 DIAGNOSIS — N189 Chronic kidney disease, unspecified: Secondary | ICD-10-CM | POA: Diagnosis not present

## 2014-05-22 DIAGNOSIS — Z9181 History of falling: Secondary | ICD-10-CM | POA: Diagnosis not present

## 2014-05-22 LAB — GLUCOSE, CAPILLARY
Glucose-Capillary: 137 mg/dL — ABNORMAL HIGH (ref 70–99)
Glucose-Capillary: 152 mg/dL — ABNORMAL HIGH (ref 70–99)

## 2014-05-23 ENCOUNTER — Non-Acute Institutional Stay (SKILLED_NURSING_FACILITY): Payer: Medicare Other | Admitting: Internal Medicine

## 2014-05-23 ENCOUNTER — Encounter: Payer: Self-pay | Admitting: Internal Medicine

## 2014-05-23 DIAGNOSIS — E119 Type 2 diabetes mellitus without complications: Secondary | ICD-10-CM | POA: Diagnosis not present

## 2014-05-23 DIAGNOSIS — N189 Chronic kidney disease, unspecified: Secondary | ICD-10-CM | POA: Diagnosis not present

## 2014-05-23 DIAGNOSIS — I5032 Chronic diastolic (congestive) heart failure: Secondary | ICD-10-CM | POA: Diagnosis not present

## 2014-05-23 DIAGNOSIS — Z992 Dependence on renal dialysis: Secondary | ICD-10-CM

## 2014-05-23 DIAGNOSIS — D509 Iron deficiency anemia, unspecified: Secondary | ICD-10-CM | POA: Diagnosis not present

## 2014-05-23 DIAGNOSIS — D631 Anemia in chronic kidney disease: Secondary | ICD-10-CM

## 2014-05-23 DIAGNOSIS — N186 End stage renal disease: Secondary | ICD-10-CM

## 2014-05-23 HISTORY — DX: Chronic diastolic (congestive) heart failure: I50.32

## 2014-05-23 LAB — GLUCOSE, CAPILLARY
Glucose-Capillary: 166 mg/dL — ABNORMAL HIGH (ref 70–99)
Glucose-Capillary: 211 mg/dL — ABNORMAL HIGH (ref 70–99)

## 2014-05-23 NOTE — Progress Notes (Signed)
Patient ID: [name], female   DOB: 1930-09-08, 81 y.o.   MRN: [id number]   This is a routine visit.  Level care skilled.  Facility [facility]   Chief complaint-medical management of chronic medical conditions including end-stage renal disease on chronic hemodialysis-edema-dementia-neuropathy-diabetes type 2  HISTORY OF PRESENT ILLNESS: This is an 81 year-old woman who was admitted to hospital from 08/24/2013 through 08/27/2013.     , she was admitted on this occasion with frequent falls for 4-5 days and generalized weakness .   She was admitted to hospital with a creatinine of 6.87, a BUN of 85. A urinalysis was suggestive of a UTI. She was admitted to hospital with acute-on-chronic renal failure. She was managed by intravenous fluids and then restarted on diuretics, transitioned to oral Demadex.      Echocardiogram showed an EF of 0000000, grade 2 diastolic dysfunction, moderate left atrial enlargement, very mild aortic stenosis, moderate tricuspid regurgitation with a pulmonary artery systolic pressure of 54, moderate posterior pericardial effusion   She continues on torsemide-.  She has been started on dialysis and she appears to be tolerating this well  iin regards to diabetes she is onTradjenta-CBGs appear to  pretty stable in the low to higher 100s--rare readings above 200   When I saw her recently she did complain of some left have pain was thought possibly this was neuropathic-x-rays were nondiagnostic-this is been evaluated apparently by dialysis and they have okayed her to have her Neurontin increased to 300 mg twice a day apparently this is helping   Tonight she has no complaints-she did have dialysis earlier today and says she feels a bit tired but this is not unusual  .   PAST MEDICAL HISTORY/PROBLEM LIST:   Acute-on-chronic renal failure stage IV.   Anemia of chronic renal disease.   Type 2 diabetes.   Hyperlipidemia.    History of CABG, patient states in 2009.   Mild dementia.   Gait ataxia with frequent falls.   Uremia.   Hyperkalemia.   Unspecified thrombocytopenia.  CURRENT MEDICATIONS: reviewed per MAR:     .  SOCIAL HISTORY:  HOUSING: The patient states she livedon her own at [employer], which is independent apartments.   Marland Kitchen   FAMILY HISTORY: She does not carry any family history of diabetes.  CHILDREN: She had one daughter who died of cancer.   REVIEW OF SYSTEMS: Gen. No complaints of fever chills.  Head ears eyes nose mouth and throat does not complain of visual changes or sore throat.    CHEST/RESPIRATORY: Clear air entry bilaterally. She is not complaining of shortness of breath.  CARDIAC: No clear chest pain or palpitations.  GI: No nausea, vomiting or diarrhea.or abdominal pain  GU: No urinary difficulties.--is now on dialysis  Muscle skeletal does not really complain of joint pain   NEUROLOGICAL:  Does not complain of headache or dizziness does complain of some possible neuropathic pain in her left hand-appears this has improved   PHYSICAL EXAMINATION:    She is afebrile pulse of 80 respirations 17 blood pressure 114/46 of these appear to be stable     GENERAL APPEARANCE: The patient is pleasant, cooperative, alert, and conversational. Her skin is warm and dry-currently receiving dialysis left upper arm - Bruit is appreciated Oropharynx clear mucous membranes moist  CHEST/RESPIRATORY:clear to auscultation no labored breathing .  CARDIOVASCULAR:  CARDIAC: 2/6 midsystolic murmur --regular rate and rhythm with occasional irregular beat--she has mild lower extremity edema  GASTROINTESTINAL:  Abdomen is soft nontender positive bowel sounds  Muscle skeletal-moves all extremities 4 does ambulate fairly well with her walker she ambulates  quite a bit about facility.   s   NEUROLOGICAL:--neurologic could not appreciate any lateralizing findings her speech is clear cranial nerves are grossly intact she is able to ambulate with a walker .   Marland Kitchen  PSYCHIATRIC:  MENTAL STATUS: The patient is alert, conversational, can provide most of her history.  I suspect the dementia as mild is probably correct  Labs.  02/06/2015.  Cholesterol 137-triglycerides 67-HDL 64-LDL 60-.  Hemoglobin A1c 7.3.  WBC 5.8 hemoglobin 10.3 platelets 190.  Sodium 136 potassium 5 BUN 38 creatinine 3.35.  Albumin 3.3 otherwise liver function tests within normal limits  08/30/2013.  WBC 5.1 hemoglobin 7.4 platelets 108.  Sodium 136 potassium 4.9 BUN 83 creatinine 6.47.   ASSESSMENT/PLAN:  Type 2 diabetes with complications including chronic renal failure which is stage IV, severe peripheral neuropathy--she is now on dialysis-blood sugars appear to be stable on an oral agent.                History of coronary artery disease. Status post CABG. There is no evidence that this is active.   Diastolic heart failure. Echocardiogram dshowed grade 2 diastolic relaxation, mild AS and mild TR. She does have pulmonary hypertension, however--this appears to be relatively stable she is on diureticr.   Dementia. I think this is mild.she is functioning well in this setting she is on Aricept.--Which was recently decreased to 5 mg                                                         hyperlipidemia-she is on Crestor this may be as well for coronary artery disease--good panel in November 2015 was unremarkable LDL of 60                                                             Anemia most likely from chronic disease she is on iron.   g.   gait ataxia.  She appears to do fairly well with her walker which is encouraging.  Diabetes 2-at this point appears satisfactory on current medication-recent hemoglobin A1c 7.3    [id number]   le.

## 2014-05-24 DIAGNOSIS — N189 Chronic kidney disease, unspecified: Secondary | ICD-10-CM | POA: Diagnosis not present

## 2014-05-24 DIAGNOSIS — M6281 Muscle weakness (generalized): Secondary | ICD-10-CM | POA: Diagnosis not present

## 2014-05-24 DIAGNOSIS — Z9181 History of falling: Secondary | ICD-10-CM | POA: Diagnosis not present

## 2014-05-24 DIAGNOSIS — R279 Unspecified lack of coordination: Secondary | ICD-10-CM | POA: Diagnosis not present

## 2014-05-24 LAB — GLUCOSE, CAPILLARY: Glucose-Capillary: 125 mg/dL — ABNORMAL HIGH (ref 70–99)

## 2014-05-25 DIAGNOSIS — D509 Iron deficiency anemia, unspecified: Secondary | ICD-10-CM | POA: Diagnosis not present

## 2014-05-25 DIAGNOSIS — D631 Anemia in chronic kidney disease: Secondary | ICD-10-CM | POA: Diagnosis not present

## 2014-05-25 DIAGNOSIS — Z992 Dependence on renal dialysis: Secondary | ICD-10-CM | POA: Diagnosis not present

## 2014-05-25 DIAGNOSIS — N186 End stage renal disease: Secondary | ICD-10-CM | POA: Diagnosis not present

## 2014-05-26 DIAGNOSIS — R279 Unspecified lack of coordination: Secondary | ICD-10-CM | POA: Diagnosis not present

## 2014-05-26 DIAGNOSIS — Z9181 History of falling: Secondary | ICD-10-CM | POA: Diagnosis not present

## 2014-05-26 DIAGNOSIS — N189 Chronic kidney disease, unspecified: Secondary | ICD-10-CM | POA: Diagnosis not present

## 2014-05-26 DIAGNOSIS — M6281 Muscle weakness (generalized): Secondary | ICD-10-CM | POA: Diagnosis not present

## 2014-05-27 DIAGNOSIS — D631 Anemia in chronic kidney disease: Secondary | ICD-10-CM | POA: Diagnosis not present

## 2014-05-27 DIAGNOSIS — N186 End stage renal disease: Secondary | ICD-10-CM | POA: Diagnosis not present

## 2014-05-27 DIAGNOSIS — D509 Iron deficiency anemia, unspecified: Secondary | ICD-10-CM | POA: Diagnosis not present

## 2014-05-27 DIAGNOSIS — Z992 Dependence on renal dialysis: Secondary | ICD-10-CM | POA: Diagnosis not present

## 2014-05-29 DIAGNOSIS — R279 Unspecified lack of coordination: Secondary | ICD-10-CM | POA: Diagnosis not present

## 2014-05-29 DIAGNOSIS — Z992 Dependence on renal dialysis: Secondary | ICD-10-CM | POA: Diagnosis not present

## 2014-05-29 DIAGNOSIS — N189 Chronic kidney disease, unspecified: Secondary | ICD-10-CM | POA: Diagnosis not present

## 2014-05-29 DIAGNOSIS — M6281 Muscle weakness (generalized): Secondary | ICD-10-CM | POA: Diagnosis not present

## 2014-05-29 DIAGNOSIS — Z9181 History of falling: Secondary | ICD-10-CM | POA: Diagnosis not present

## 2014-05-29 DIAGNOSIS — N186 End stage renal disease: Secondary | ICD-10-CM | POA: Diagnosis not present

## 2014-05-30 DIAGNOSIS — Z9181 History of falling: Secondary | ICD-10-CM | POA: Diagnosis not present

## 2014-05-30 DIAGNOSIS — R279 Unspecified lack of coordination: Secondary | ICD-10-CM | POA: Diagnosis not present

## 2014-05-30 DIAGNOSIS — D631 Anemia in chronic kidney disease: Secondary | ICD-10-CM | POA: Diagnosis not present

## 2014-05-30 DIAGNOSIS — R489 Unspecified symbolic dysfunctions: Secondary | ICD-10-CM | POA: Diagnosis not present

## 2014-05-30 DIAGNOSIS — N186 End stage renal disease: Secondary | ICD-10-CM | POA: Diagnosis not present

## 2014-05-30 DIAGNOSIS — E119 Type 2 diabetes mellitus without complications: Secondary | ICD-10-CM | POA: Diagnosis not present

## 2014-05-30 DIAGNOSIS — N189 Chronic kidney disease, unspecified: Secondary | ICD-10-CM | POA: Diagnosis not present

## 2014-05-30 DIAGNOSIS — D509 Iron deficiency anemia, unspecified: Secondary | ICD-10-CM | POA: Diagnosis not present

## 2014-05-30 DIAGNOSIS — M6281 Muscle weakness (generalized): Secondary | ICD-10-CM | POA: Diagnosis not present

## 2014-05-30 DIAGNOSIS — Z992 Dependence on renal dialysis: Secondary | ICD-10-CM | POA: Diagnosis not present

## 2014-05-30 DIAGNOSIS — I5032 Chronic diastolic (congestive) heart failure: Secondary | ICD-10-CM | POA: Diagnosis not present

## 2014-05-31 DIAGNOSIS — R489 Unspecified symbolic dysfunctions: Secondary | ICD-10-CM | POA: Diagnosis not present

## 2014-05-31 DIAGNOSIS — R279 Unspecified lack of coordination: Secondary | ICD-10-CM | POA: Diagnosis not present

## 2014-05-31 DIAGNOSIS — I5032 Chronic diastolic (congestive) heart failure: Secondary | ICD-10-CM | POA: Diagnosis not present

## 2014-05-31 DIAGNOSIS — M6281 Muscle weakness (generalized): Secondary | ICD-10-CM | POA: Diagnosis not present

## 2014-05-31 DIAGNOSIS — Z9181 History of falling: Secondary | ICD-10-CM | POA: Diagnosis not present

## 2014-05-31 DIAGNOSIS — E119 Type 2 diabetes mellitus without complications: Secondary | ICD-10-CM | POA: Diagnosis not present

## 2014-05-31 DIAGNOSIS — N189 Chronic kidney disease, unspecified: Secondary | ICD-10-CM | POA: Diagnosis not present

## 2014-06-01 DIAGNOSIS — Z992 Dependence on renal dialysis: Secondary | ICD-10-CM | POA: Diagnosis not present

## 2014-06-01 DIAGNOSIS — N186 End stage renal disease: Secondary | ICD-10-CM | POA: Diagnosis not present

## 2014-06-01 DIAGNOSIS — D509 Iron deficiency anemia, unspecified: Secondary | ICD-10-CM | POA: Diagnosis not present

## 2014-06-01 DIAGNOSIS — D631 Anemia in chronic kidney disease: Secondary | ICD-10-CM | POA: Diagnosis not present

## 2014-06-03 DIAGNOSIS — D631 Anemia in chronic kidney disease: Secondary | ICD-10-CM | POA: Diagnosis not present

## 2014-06-03 DIAGNOSIS — Z992 Dependence on renal dialysis: Secondary | ICD-10-CM | POA: Diagnosis not present

## 2014-06-03 DIAGNOSIS — D509 Iron deficiency anemia, unspecified: Secondary | ICD-10-CM | POA: Diagnosis not present

## 2014-06-03 DIAGNOSIS — N186 End stage renal disease: Secondary | ICD-10-CM | POA: Diagnosis not present

## 2014-06-04 DIAGNOSIS — I5032 Chronic diastolic (congestive) heart failure: Secondary | ICD-10-CM | POA: Diagnosis not present

## 2014-06-04 DIAGNOSIS — M6281 Muscle weakness (generalized): Secondary | ICD-10-CM | POA: Diagnosis not present

## 2014-06-04 DIAGNOSIS — R279 Unspecified lack of coordination: Secondary | ICD-10-CM | POA: Diagnosis not present

## 2014-06-04 DIAGNOSIS — E119 Type 2 diabetes mellitus without complications: Secondary | ICD-10-CM | POA: Diagnosis not present

## 2014-06-04 DIAGNOSIS — R489 Unspecified symbolic dysfunctions: Secondary | ICD-10-CM | POA: Diagnosis not present

## 2014-06-04 DIAGNOSIS — Z9181 History of falling: Secondary | ICD-10-CM | POA: Diagnosis not present

## 2014-06-04 DIAGNOSIS — N189 Chronic kidney disease, unspecified: Secondary | ICD-10-CM | POA: Diagnosis not present

## 2014-06-05 DIAGNOSIS — M6281 Muscle weakness (generalized): Secondary | ICD-10-CM | POA: Diagnosis not present

## 2014-06-05 DIAGNOSIS — Z9181 History of falling: Secondary | ICD-10-CM | POA: Diagnosis not present

## 2014-06-05 DIAGNOSIS — N189 Chronic kidney disease, unspecified: Secondary | ICD-10-CM | POA: Diagnosis not present

## 2014-06-05 DIAGNOSIS — R489 Unspecified symbolic dysfunctions: Secondary | ICD-10-CM | POA: Diagnosis not present

## 2014-06-05 DIAGNOSIS — E119 Type 2 diabetes mellitus without complications: Secondary | ICD-10-CM | POA: Diagnosis not present

## 2014-06-05 DIAGNOSIS — I5032 Chronic diastolic (congestive) heart failure: Secondary | ICD-10-CM | POA: Diagnosis not present

## 2014-06-05 DIAGNOSIS — R279 Unspecified lack of coordination: Secondary | ICD-10-CM | POA: Diagnosis not present

## 2014-06-06 DIAGNOSIS — N186 End stage renal disease: Secondary | ICD-10-CM | POA: Diagnosis not present

## 2014-06-06 DIAGNOSIS — E119 Type 2 diabetes mellitus without complications: Secondary | ICD-10-CM | POA: Diagnosis not present

## 2014-06-06 DIAGNOSIS — Z992 Dependence on renal dialysis: Secondary | ICD-10-CM | POA: Diagnosis not present

## 2014-06-06 DIAGNOSIS — M6281 Muscle weakness (generalized): Secondary | ICD-10-CM | POA: Diagnosis not present

## 2014-06-06 DIAGNOSIS — I5032 Chronic diastolic (congestive) heart failure: Secondary | ICD-10-CM | POA: Diagnosis not present

## 2014-06-06 DIAGNOSIS — D631 Anemia in chronic kidney disease: Secondary | ICD-10-CM | POA: Diagnosis not present

## 2014-06-06 DIAGNOSIS — D509 Iron deficiency anemia, unspecified: Secondary | ICD-10-CM | POA: Diagnosis not present

## 2014-06-06 DIAGNOSIS — R489 Unspecified symbolic dysfunctions: Secondary | ICD-10-CM | POA: Diagnosis not present

## 2014-06-06 DIAGNOSIS — Z9181 History of falling: Secondary | ICD-10-CM | POA: Diagnosis not present

## 2014-06-06 DIAGNOSIS — N189 Chronic kidney disease, unspecified: Secondary | ICD-10-CM | POA: Diagnosis not present

## 2014-06-06 DIAGNOSIS — R279 Unspecified lack of coordination: Secondary | ICD-10-CM | POA: Diagnosis not present

## 2014-06-08 DIAGNOSIS — D631 Anemia in chronic kidney disease: Secondary | ICD-10-CM | POA: Diagnosis not present

## 2014-06-08 DIAGNOSIS — D509 Iron deficiency anemia, unspecified: Secondary | ICD-10-CM | POA: Diagnosis not present

## 2014-06-08 DIAGNOSIS — N186 End stage renal disease: Secondary | ICD-10-CM | POA: Diagnosis not present

## 2014-06-08 DIAGNOSIS — Z992 Dependence on renal dialysis: Secondary | ICD-10-CM | POA: Diagnosis not present

## 2014-06-09 DIAGNOSIS — N189 Chronic kidney disease, unspecified: Secondary | ICD-10-CM | POA: Diagnosis not present

## 2014-06-09 DIAGNOSIS — E119 Type 2 diabetes mellitus without complications: Secondary | ICD-10-CM | POA: Diagnosis not present

## 2014-06-09 DIAGNOSIS — Z9181 History of falling: Secondary | ICD-10-CM | POA: Diagnosis not present

## 2014-06-09 DIAGNOSIS — R489 Unspecified symbolic dysfunctions: Secondary | ICD-10-CM | POA: Diagnosis not present

## 2014-06-09 DIAGNOSIS — M6281 Muscle weakness (generalized): Secondary | ICD-10-CM | POA: Diagnosis not present

## 2014-06-09 DIAGNOSIS — I5032 Chronic diastolic (congestive) heart failure: Secondary | ICD-10-CM | POA: Diagnosis not present

## 2014-06-09 DIAGNOSIS — R279 Unspecified lack of coordination: Secondary | ICD-10-CM | POA: Diagnosis not present

## 2014-06-10 DIAGNOSIS — Z992 Dependence on renal dialysis: Secondary | ICD-10-CM | POA: Diagnosis not present

## 2014-06-10 DIAGNOSIS — D509 Iron deficiency anemia, unspecified: Secondary | ICD-10-CM | POA: Diagnosis not present

## 2014-06-10 DIAGNOSIS — D631 Anemia in chronic kidney disease: Secondary | ICD-10-CM | POA: Diagnosis not present

## 2014-06-10 DIAGNOSIS — N186 End stage renal disease: Secondary | ICD-10-CM | POA: Diagnosis not present

## 2014-06-12 DIAGNOSIS — Z9181 History of falling: Secondary | ICD-10-CM | POA: Diagnosis not present

## 2014-06-12 DIAGNOSIS — R489 Unspecified symbolic dysfunctions: Secondary | ICD-10-CM | POA: Diagnosis not present

## 2014-06-12 DIAGNOSIS — E119 Type 2 diabetes mellitus without complications: Secondary | ICD-10-CM | POA: Diagnosis not present

## 2014-06-12 DIAGNOSIS — R279 Unspecified lack of coordination: Secondary | ICD-10-CM | POA: Diagnosis not present

## 2014-06-12 DIAGNOSIS — M6281 Muscle weakness (generalized): Secondary | ICD-10-CM | POA: Diagnosis not present

## 2014-06-12 DIAGNOSIS — I5032 Chronic diastolic (congestive) heart failure: Secondary | ICD-10-CM | POA: Diagnosis not present

## 2014-06-12 DIAGNOSIS — N189 Chronic kidney disease, unspecified: Secondary | ICD-10-CM | POA: Diagnosis not present

## 2014-06-13 DIAGNOSIS — D509 Iron deficiency anemia, unspecified: Secondary | ICD-10-CM | POA: Diagnosis not present

## 2014-06-13 DIAGNOSIS — M6281 Muscle weakness (generalized): Secondary | ICD-10-CM | POA: Diagnosis not present

## 2014-06-13 DIAGNOSIS — I5032 Chronic diastolic (congestive) heart failure: Secondary | ICD-10-CM | POA: Diagnosis not present

## 2014-06-13 DIAGNOSIS — R489 Unspecified symbolic dysfunctions: Secondary | ICD-10-CM | POA: Diagnosis not present

## 2014-06-13 DIAGNOSIS — D631 Anemia in chronic kidney disease: Secondary | ICD-10-CM | POA: Diagnosis not present

## 2014-06-13 DIAGNOSIS — E119 Type 2 diabetes mellitus without complications: Secondary | ICD-10-CM | POA: Diagnosis not present

## 2014-06-13 DIAGNOSIS — N186 End stage renal disease: Secondary | ICD-10-CM | POA: Diagnosis not present

## 2014-06-13 DIAGNOSIS — Z9181 History of falling: Secondary | ICD-10-CM | POA: Diagnosis not present

## 2014-06-13 DIAGNOSIS — Z992 Dependence on renal dialysis: Secondary | ICD-10-CM | POA: Diagnosis not present

## 2014-06-13 DIAGNOSIS — R279 Unspecified lack of coordination: Secondary | ICD-10-CM | POA: Diagnosis not present

## 2014-06-13 DIAGNOSIS — N189 Chronic kidney disease, unspecified: Secondary | ICD-10-CM | POA: Diagnosis not present

## 2014-06-14 ENCOUNTER — Other Ambulatory Visit: Payer: Self-pay

## 2014-06-14 DIAGNOSIS — I5032 Chronic diastolic (congestive) heart failure: Secondary | ICD-10-CM | POA: Diagnosis not present

## 2014-06-14 DIAGNOSIS — N189 Chronic kidney disease, unspecified: Secondary | ICD-10-CM | POA: Diagnosis not present

## 2014-06-14 DIAGNOSIS — E119 Type 2 diabetes mellitus without complications: Secondary | ICD-10-CM | POA: Diagnosis not present

## 2014-06-14 DIAGNOSIS — R279 Unspecified lack of coordination: Secondary | ICD-10-CM | POA: Diagnosis not present

## 2014-06-14 DIAGNOSIS — R489 Unspecified symbolic dysfunctions: Secondary | ICD-10-CM | POA: Diagnosis not present

## 2014-06-14 DIAGNOSIS — Z9181 History of falling: Secondary | ICD-10-CM | POA: Diagnosis not present

## 2014-06-14 DIAGNOSIS — M6281 Muscle weakness (generalized): Secondary | ICD-10-CM | POA: Diagnosis not present

## 2014-06-14 MED ORDER — ACETAMINOPHEN-CODEINE #3 300-30 MG PO TABS: 1.0000 | ORAL_TABLET | Freq: Four times a day (QID) | ORAL | Status: DC | PRN

## 2014-06-14 NOTE — Telephone Encounter (Signed)
RX faxed to [facility] @ [phone]. Phone number [phone]  

## 2014-06-15 DIAGNOSIS — D509 Iron deficiency anemia, unspecified: Secondary | ICD-10-CM | POA: Diagnosis not present

## 2014-06-15 DIAGNOSIS — N186 End stage renal disease: Secondary | ICD-10-CM | POA: Diagnosis not present

## 2014-06-15 DIAGNOSIS — Z992 Dependence on renal dialysis: Secondary | ICD-10-CM | POA: Diagnosis not present

## 2014-06-15 DIAGNOSIS — D631 Anemia in chronic kidney disease: Secondary | ICD-10-CM | POA: Diagnosis not present

## 2014-06-17 DIAGNOSIS — D631 Anemia in chronic kidney disease: Secondary | ICD-10-CM | POA: Diagnosis not present

## 2014-06-17 DIAGNOSIS — N186 End stage renal disease: Secondary | ICD-10-CM | POA: Diagnosis not present

## 2014-06-17 DIAGNOSIS — D509 Iron deficiency anemia, unspecified: Secondary | ICD-10-CM | POA: Diagnosis not present

## 2014-06-17 DIAGNOSIS — Z992 Dependence on renal dialysis: Secondary | ICD-10-CM | POA: Diagnosis not present

## 2014-06-20 DIAGNOSIS — M6281 Muscle weakness (generalized): Secondary | ICD-10-CM | POA: Diagnosis not present

## 2014-06-20 DIAGNOSIS — Z992 Dependence on renal dialysis: Secondary | ICD-10-CM | POA: Diagnosis not present

## 2014-06-20 DIAGNOSIS — R279 Unspecified lack of coordination: Secondary | ICD-10-CM | POA: Diagnosis not present

## 2014-06-20 DIAGNOSIS — N186 End stage renal disease: Secondary | ICD-10-CM | POA: Diagnosis not present

## 2014-06-20 DIAGNOSIS — R489 Unspecified symbolic dysfunctions: Secondary | ICD-10-CM | POA: Diagnosis not present

## 2014-06-20 DIAGNOSIS — N189 Chronic kidney disease, unspecified: Secondary | ICD-10-CM | POA: Diagnosis not present

## 2014-06-20 DIAGNOSIS — Z9181 History of falling: Secondary | ICD-10-CM | POA: Diagnosis not present

## 2014-06-20 DIAGNOSIS — D509 Iron deficiency anemia, unspecified: Secondary | ICD-10-CM | POA: Diagnosis not present

## 2014-06-20 DIAGNOSIS — E119 Type 2 diabetes mellitus without complications: Secondary | ICD-10-CM | POA: Diagnosis not present

## 2014-06-20 DIAGNOSIS — I5032 Chronic diastolic (congestive) heart failure: Secondary | ICD-10-CM | POA: Diagnosis not present

## 2014-06-20 DIAGNOSIS — D631 Anemia in chronic kidney disease: Secondary | ICD-10-CM | POA: Diagnosis not present

## 2014-06-21 DIAGNOSIS — I5032 Chronic diastolic (congestive) heart failure: Secondary | ICD-10-CM | POA: Diagnosis not present

## 2014-06-21 DIAGNOSIS — E119 Type 2 diabetes mellitus without complications: Secondary | ICD-10-CM | POA: Diagnosis not present

## 2014-06-21 DIAGNOSIS — M6281 Muscle weakness (generalized): Secondary | ICD-10-CM | POA: Diagnosis not present

## 2014-06-21 DIAGNOSIS — N189 Chronic kidney disease, unspecified: Secondary | ICD-10-CM | POA: Diagnosis not present

## 2014-06-21 DIAGNOSIS — R279 Unspecified lack of coordination: Secondary | ICD-10-CM | POA: Diagnosis not present

## 2014-06-21 DIAGNOSIS — R489 Unspecified symbolic dysfunctions: Secondary | ICD-10-CM | POA: Diagnosis not present

## 2014-06-21 DIAGNOSIS — Z9181 History of falling: Secondary | ICD-10-CM | POA: Diagnosis not present

## 2014-06-22 DIAGNOSIS — N186 End stage renal disease: Secondary | ICD-10-CM | POA: Diagnosis not present

## 2014-06-22 DIAGNOSIS — R279 Unspecified lack of coordination: Secondary | ICD-10-CM | POA: Diagnosis not present

## 2014-06-22 DIAGNOSIS — D631 Anemia in chronic kidney disease: Secondary | ICD-10-CM | POA: Diagnosis not present

## 2014-06-22 DIAGNOSIS — E119 Type 2 diabetes mellitus without complications: Secondary | ICD-10-CM | POA: Diagnosis not present

## 2014-06-22 DIAGNOSIS — I5032 Chronic diastolic (congestive) heart failure: Secondary | ICD-10-CM | POA: Diagnosis not present

## 2014-06-22 DIAGNOSIS — R489 Unspecified symbolic dysfunctions: Secondary | ICD-10-CM | POA: Diagnosis not present

## 2014-06-22 DIAGNOSIS — Z992 Dependence on renal dialysis: Secondary | ICD-10-CM | POA: Diagnosis not present

## 2014-06-22 DIAGNOSIS — M6281 Muscle weakness (generalized): Secondary | ICD-10-CM | POA: Diagnosis not present

## 2014-06-22 DIAGNOSIS — D509 Iron deficiency anemia, unspecified: Secondary | ICD-10-CM | POA: Diagnosis not present

## 2014-06-22 DIAGNOSIS — Z9181 History of falling: Secondary | ICD-10-CM | POA: Diagnosis not present

## 2014-06-22 DIAGNOSIS — N189 Chronic kidney disease, unspecified: Secondary | ICD-10-CM | POA: Diagnosis not present

## 2014-06-23 DIAGNOSIS — E119 Type 2 diabetes mellitus without complications: Secondary | ICD-10-CM | POA: Diagnosis not present

## 2014-06-23 DIAGNOSIS — R489 Unspecified symbolic dysfunctions: Secondary | ICD-10-CM | POA: Diagnosis not present

## 2014-06-23 DIAGNOSIS — R279 Unspecified lack of coordination: Secondary | ICD-10-CM | POA: Diagnosis not present

## 2014-06-23 DIAGNOSIS — Z9181 History of falling: Secondary | ICD-10-CM | POA: Diagnosis not present

## 2014-06-23 DIAGNOSIS — N189 Chronic kidney disease, unspecified: Secondary | ICD-10-CM | POA: Diagnosis not present

## 2014-06-23 DIAGNOSIS — M6281 Muscle weakness (generalized): Secondary | ICD-10-CM | POA: Diagnosis not present

## 2014-06-23 DIAGNOSIS — I5032 Chronic diastolic (congestive) heart failure: Secondary | ICD-10-CM | POA: Diagnosis not present

## 2014-06-24 DIAGNOSIS — Z992 Dependence on renal dialysis: Secondary | ICD-10-CM | POA: Diagnosis not present

## 2014-06-24 DIAGNOSIS — D631 Anemia in chronic kidney disease: Secondary | ICD-10-CM | POA: Diagnosis not present

## 2014-06-24 DIAGNOSIS — N186 End stage renal disease: Secondary | ICD-10-CM | POA: Diagnosis not present

## 2014-06-24 DIAGNOSIS — D509 Iron deficiency anemia, unspecified: Secondary | ICD-10-CM | POA: Diagnosis not present

## 2014-06-26 DIAGNOSIS — R489 Unspecified symbolic dysfunctions: Secondary | ICD-10-CM | POA: Diagnosis not present

## 2014-06-26 DIAGNOSIS — N189 Chronic kidney disease, unspecified: Secondary | ICD-10-CM | POA: Diagnosis not present

## 2014-06-26 DIAGNOSIS — E119 Type 2 diabetes mellitus without complications: Secondary | ICD-10-CM | POA: Diagnosis not present

## 2014-06-26 DIAGNOSIS — R279 Unspecified lack of coordination: Secondary | ICD-10-CM | POA: Diagnosis not present

## 2014-06-26 DIAGNOSIS — Z9181 History of falling: Secondary | ICD-10-CM | POA: Diagnosis not present

## 2014-06-26 DIAGNOSIS — I5032 Chronic diastolic (congestive) heart failure: Secondary | ICD-10-CM | POA: Diagnosis not present

## 2014-06-26 DIAGNOSIS — M6281 Muscle weakness (generalized): Secondary | ICD-10-CM | POA: Diagnosis not present

## 2014-06-27 DIAGNOSIS — M6281 Muscle weakness (generalized): Secondary | ICD-10-CM | POA: Diagnosis not present

## 2014-06-27 DIAGNOSIS — N189 Chronic kidney disease, unspecified: Secondary | ICD-10-CM | POA: Diagnosis not present

## 2014-06-27 DIAGNOSIS — R279 Unspecified lack of coordination: Secondary | ICD-10-CM | POA: Diagnosis not present

## 2014-06-27 DIAGNOSIS — Z9181 History of falling: Secondary | ICD-10-CM | POA: Diagnosis not present

## 2014-06-27 DIAGNOSIS — D509 Iron deficiency anemia, unspecified: Secondary | ICD-10-CM | POA: Diagnosis not present

## 2014-06-27 DIAGNOSIS — E119 Type 2 diabetes mellitus without complications: Secondary | ICD-10-CM | POA: Diagnosis not present

## 2014-06-27 DIAGNOSIS — I5032 Chronic diastolic (congestive) heart failure: Secondary | ICD-10-CM | POA: Diagnosis not present

## 2014-06-27 DIAGNOSIS — D631 Anemia in chronic kidney disease: Secondary | ICD-10-CM | POA: Diagnosis not present

## 2014-06-27 DIAGNOSIS — R489 Unspecified symbolic dysfunctions: Secondary | ICD-10-CM | POA: Diagnosis not present

## 2014-06-27 DIAGNOSIS — N186 End stage renal disease: Secondary | ICD-10-CM | POA: Diagnosis not present

## 2014-06-27 DIAGNOSIS — Z992 Dependence on renal dialysis: Secondary | ICD-10-CM | POA: Diagnosis not present

## 2014-06-28 DIAGNOSIS — M6281 Muscle weakness (generalized): Secondary | ICD-10-CM | POA: Diagnosis not present

## 2014-06-28 DIAGNOSIS — R279 Unspecified lack of coordination: Secondary | ICD-10-CM | POA: Diagnosis not present

## 2014-06-28 DIAGNOSIS — N189 Chronic kidney disease, unspecified: Secondary | ICD-10-CM | POA: Diagnosis not present

## 2014-06-28 DIAGNOSIS — I5032 Chronic diastolic (congestive) heart failure: Secondary | ICD-10-CM | POA: Diagnosis not present

## 2014-06-28 DIAGNOSIS — Z9181 History of falling: Secondary | ICD-10-CM | POA: Diagnosis not present

## 2014-06-28 DIAGNOSIS — E119 Type 2 diabetes mellitus without complications: Secondary | ICD-10-CM | POA: Diagnosis not present

## 2014-06-28 DIAGNOSIS — R489 Unspecified symbolic dysfunctions: Secondary | ICD-10-CM | POA: Diagnosis not present

## 2014-06-29 DIAGNOSIS — E11329 Type 2 diabetes mellitus with mild nonproliferative diabetic retinopathy without macular edema: Secondary | ICD-10-CM | POA: Diagnosis not present

## 2014-06-29 DIAGNOSIS — D631 Anemia in chronic kidney disease: Secondary | ICD-10-CM | POA: Diagnosis not present

## 2014-06-29 DIAGNOSIS — Z992 Dependence on renal dialysis: Secondary | ICD-10-CM | POA: Diagnosis not present

## 2014-06-29 DIAGNOSIS — E119 Type 2 diabetes mellitus without complications: Secondary | ICD-10-CM | POA: Diagnosis not present

## 2014-06-29 DIAGNOSIS — N186 End stage renal disease: Secondary | ICD-10-CM | POA: Diagnosis not present

## 2014-06-29 DIAGNOSIS — R489 Unspecified symbolic dysfunctions: Secondary | ICD-10-CM | POA: Diagnosis not present

## 2014-06-29 DIAGNOSIS — Z9181 History of falling: Secondary | ICD-10-CM | POA: Diagnosis not present

## 2014-06-29 DIAGNOSIS — H1812 Bullous keratopathy, left eye: Secondary | ICD-10-CM | POA: Diagnosis not present

## 2014-06-29 DIAGNOSIS — M6281 Muscle weakness (generalized): Secondary | ICD-10-CM | POA: Diagnosis not present

## 2014-06-29 DIAGNOSIS — H16223 Keratoconjunctivitis sicca, not specified as Sjogren's, bilateral: Secondary | ICD-10-CM | POA: Diagnosis not present

## 2014-06-29 DIAGNOSIS — R279 Unspecified lack of coordination: Secondary | ICD-10-CM | POA: Diagnosis not present

## 2014-06-29 DIAGNOSIS — H52223 Regular astigmatism, bilateral: Secondary | ICD-10-CM | POA: Diagnosis not present

## 2014-06-29 DIAGNOSIS — N189 Chronic kidney disease, unspecified: Secondary | ICD-10-CM | POA: Diagnosis not present

## 2014-06-29 DIAGNOSIS — I5032 Chronic diastolic (congestive) heart failure: Secondary | ICD-10-CM | POA: Diagnosis not present

## 2014-06-29 DIAGNOSIS — D509 Iron deficiency anemia, unspecified: Secondary | ICD-10-CM | POA: Diagnosis not present

## 2014-07-01 DIAGNOSIS — D631 Anemia in chronic kidney disease: Secondary | ICD-10-CM | POA: Diagnosis not present

## 2014-07-01 DIAGNOSIS — N186 End stage renal disease: Secondary | ICD-10-CM | POA: Diagnosis not present

## 2014-07-01 DIAGNOSIS — Z992 Dependence on renal dialysis: Secondary | ICD-10-CM | POA: Diagnosis not present

## 2014-07-01 DIAGNOSIS — D509 Iron deficiency anemia, unspecified: Secondary | ICD-10-CM | POA: Diagnosis not present

## 2014-07-03 DIAGNOSIS — N189 Chronic kidney disease, unspecified: Secondary | ICD-10-CM | POA: Diagnosis not present

## 2014-07-03 DIAGNOSIS — M6281 Muscle weakness (generalized): Secondary | ICD-10-CM | POA: Diagnosis not present

## 2014-07-03 DIAGNOSIS — I5032 Chronic diastolic (congestive) heart failure: Secondary | ICD-10-CM | POA: Diagnosis not present

## 2014-07-03 DIAGNOSIS — R488 Other symbolic dysfunctions: Secondary | ICD-10-CM | POA: Diagnosis not present

## 2014-07-03 DIAGNOSIS — R279 Unspecified lack of coordination: Secondary | ICD-10-CM | POA: Diagnosis not present

## 2014-07-03 DIAGNOSIS — R489 Unspecified symbolic dysfunctions: Secondary | ICD-10-CM | POA: Diagnosis not present

## 2014-07-03 DIAGNOSIS — E119 Type 2 diabetes mellitus without complications: Secondary | ICD-10-CM | POA: Diagnosis not present

## 2014-07-03 DIAGNOSIS — Z9181 History of falling: Secondary | ICD-10-CM | POA: Diagnosis not present

## 2014-07-04 DIAGNOSIS — D631 Anemia in chronic kidney disease: Secondary | ICD-10-CM | POA: Diagnosis not present

## 2014-07-04 DIAGNOSIS — N186 End stage renal disease: Secondary | ICD-10-CM | POA: Diagnosis not present

## 2014-07-04 DIAGNOSIS — D509 Iron deficiency anemia, unspecified: Secondary | ICD-10-CM | POA: Diagnosis not present

## 2014-07-04 DIAGNOSIS — Z992 Dependence on renal dialysis: Secondary | ICD-10-CM | POA: Diagnosis not present

## 2014-07-05 DIAGNOSIS — R279 Unspecified lack of coordination: Secondary | ICD-10-CM | POA: Diagnosis not present

## 2014-07-05 DIAGNOSIS — M6281 Muscle weakness (generalized): Secondary | ICD-10-CM | POA: Diagnosis not present

## 2014-07-05 DIAGNOSIS — E119 Type 2 diabetes mellitus without complications: Secondary | ICD-10-CM | POA: Diagnosis not present

## 2014-07-05 DIAGNOSIS — Z9181 History of falling: Secondary | ICD-10-CM | POA: Diagnosis not present

## 2014-07-05 DIAGNOSIS — R489 Unspecified symbolic dysfunctions: Secondary | ICD-10-CM | POA: Diagnosis not present

## 2014-07-05 DIAGNOSIS — I5032 Chronic diastolic (congestive) heart failure: Secondary | ICD-10-CM | POA: Diagnosis not present

## 2014-07-05 DIAGNOSIS — N189 Chronic kidney disease, unspecified: Secondary | ICD-10-CM | POA: Diagnosis not present

## 2014-07-05 DIAGNOSIS — R488 Other symbolic dysfunctions: Secondary | ICD-10-CM | POA: Diagnosis not present

## 2014-07-06 DIAGNOSIS — D509 Iron deficiency anemia, unspecified: Secondary | ICD-10-CM | POA: Diagnosis not present

## 2014-07-06 DIAGNOSIS — Z992 Dependence on renal dialysis: Secondary | ICD-10-CM | POA: Diagnosis not present

## 2014-07-06 DIAGNOSIS — D631 Anemia in chronic kidney disease: Secondary | ICD-10-CM | POA: Diagnosis not present

## 2014-07-06 DIAGNOSIS — N186 End stage renal disease: Secondary | ICD-10-CM | POA: Diagnosis not present

## 2014-07-08 DIAGNOSIS — N186 End stage renal disease: Secondary | ICD-10-CM | POA: Diagnosis not present

## 2014-07-08 DIAGNOSIS — Z992 Dependence on renal dialysis: Secondary | ICD-10-CM | POA: Diagnosis not present

## 2014-07-08 DIAGNOSIS — D631 Anemia in chronic kidney disease: Secondary | ICD-10-CM | POA: Diagnosis not present

## 2014-07-08 DIAGNOSIS — D509 Iron deficiency anemia, unspecified: Secondary | ICD-10-CM | POA: Diagnosis not present

## 2014-07-11 DIAGNOSIS — D509 Iron deficiency anemia, unspecified: Secondary | ICD-10-CM | POA: Diagnosis not present

## 2014-07-11 DIAGNOSIS — Z992 Dependence on renal dialysis: Secondary | ICD-10-CM | POA: Diagnosis not present

## 2014-07-11 DIAGNOSIS — N186 End stage renal disease: Secondary | ICD-10-CM | POA: Diagnosis not present

## 2014-07-11 DIAGNOSIS — D631 Anemia in chronic kidney disease: Secondary | ICD-10-CM | POA: Diagnosis not present

## 2014-07-13 DIAGNOSIS — D509 Iron deficiency anemia, unspecified: Secondary | ICD-10-CM | POA: Diagnosis not present

## 2014-07-13 DIAGNOSIS — N186 End stage renal disease: Secondary | ICD-10-CM | POA: Diagnosis not present

## 2014-07-13 DIAGNOSIS — D631 Anemia in chronic kidney disease: Secondary | ICD-10-CM | POA: Diagnosis not present

## 2014-07-13 DIAGNOSIS — Z992 Dependence on renal dialysis: Secondary | ICD-10-CM | POA: Diagnosis not present

## 2014-07-15 DIAGNOSIS — D509 Iron deficiency anemia, unspecified: Secondary | ICD-10-CM | POA: Diagnosis not present

## 2014-07-15 DIAGNOSIS — D631 Anemia in chronic kidney disease: Secondary | ICD-10-CM | POA: Diagnosis not present

## 2014-07-15 DIAGNOSIS — N186 End stage renal disease: Secondary | ICD-10-CM | POA: Diagnosis not present

## 2014-07-15 DIAGNOSIS — Z992 Dependence on renal dialysis: Secondary | ICD-10-CM | POA: Diagnosis not present

## 2014-07-18 ENCOUNTER — Other Ambulatory Visit: Payer: Self-pay | Admitting: *Deleted

## 2014-07-18 DIAGNOSIS — Z992 Dependence on renal dialysis: Secondary | ICD-10-CM | POA: Diagnosis not present

## 2014-07-18 DIAGNOSIS — D509 Iron deficiency anemia, unspecified: Secondary | ICD-10-CM | POA: Diagnosis not present

## 2014-07-18 DIAGNOSIS — N186 End stage renal disease: Secondary | ICD-10-CM | POA: Diagnosis not present

## 2014-07-18 DIAGNOSIS — D631 Anemia in chronic kidney disease: Secondary | ICD-10-CM | POA: Diagnosis not present

## 2014-07-18 MED ORDER — ACETAMINOPHEN-CODEINE #3 300-30 MG PO TABS: 1.0000 | ORAL_TABLET | Freq: Four times a day (QID) | ORAL | Status: DC | PRN

## 2014-07-18 NOTE — Telephone Encounter (Signed)
[facility] 

## 2014-07-19 ENCOUNTER — Non-Acute Institutional Stay (SKILLED_NURSING_FACILITY): Payer: Medicare Other | Admitting: Internal Medicine

## 2014-07-19 DIAGNOSIS — M79675 Pain in left toe(s): Secondary | ICD-10-CM | POA: Diagnosis not present

## 2014-07-20 DIAGNOSIS — D631 Anemia in chronic kidney disease: Secondary | ICD-10-CM | POA: Diagnosis not present

## 2014-07-20 DIAGNOSIS — N186 End stage renal disease: Secondary | ICD-10-CM | POA: Diagnosis not present

## 2014-07-20 DIAGNOSIS — D509 Iron deficiency anemia, unspecified: Secondary | ICD-10-CM | POA: Diagnosis not present

## 2014-07-20 DIAGNOSIS — Z992 Dependence on renal dialysis: Secondary | ICD-10-CM | POA: Diagnosis not present

## 2014-07-22 DIAGNOSIS — N186 End stage renal disease: Secondary | ICD-10-CM | POA: Diagnosis not present

## 2014-07-22 DIAGNOSIS — D509 Iron deficiency anemia, unspecified: Secondary | ICD-10-CM | POA: Diagnosis not present

## 2014-07-22 DIAGNOSIS — D631 Anemia in chronic kidney disease: Secondary | ICD-10-CM | POA: Diagnosis not present

## 2014-07-22 DIAGNOSIS — Z992 Dependence on renal dialysis: Secondary | ICD-10-CM | POA: Diagnosis not present

## 2014-07-22 NOTE — Op Note (Signed)
PATIENT NAME:  [name], [name] MR#:  [id number] DATE OF BIRTH:  04-29-1930  DATE OF PROCEDURE:  04-Nov-202015  PREOPERATIVE DIAGNOSES:  1.  End-stage renal disease.  2.  Diabetes.  3.  Hypertension.   POSTOPERATIVE DIAGNOSES:  1.  End-stage renal disease.  2.  Diabetes.  3.  Hypertension.   PROCEDURE:  Left brachiocephalic AV fistula creation.   SURGEON: Leotis Pain, M.D.   ANESTHESIA: General.   BLOOD LOSS: 50 mL.   INDICATION FOR PROCEDURE: The patient is an 81 year old African American female with end-stage renal disease. She needs permanent dialysis access. Risks and benefits were discussed. Informed consent was obtained.   DESCRIPTION OF PROCEDURE: The patient was brought to the operative suite and after an adequate level of general anesthesia was obtained, the left upper extremity was sterilely prepped and draped and a sterile surgical field was created. She had a visible cephalic vein at the skin that had previously not been felt to be a good vein on ultrasound several months ago. I decided to dissect out this vein pass dilators and if they passed easily, use this vein. The vein was dissected out. Several venous branches were ligated and divided between silk ties. I was able to pass up to a 4-mm dilator without any difficulty whatsoever.  With this I felt that this would be a good fistula.  It was ligated distally and marked for orientation. The artery was dissected out and encircled with vessel loops proximally and distally and the patient was given intravenous heparin. Once this had circulated for a couple minutes I pulled up control on the vessel loops. The vein was transected and cut and beveled to an appropriate length to match an anterior wall arteriotomy.  I then created an anastomosis with a running 6-0 Prolene suture in the usual fashion  and a single 6-0 Prolene patch suture was used for hemostasis.   On release there was an excellent thrill within the AV fistula and palpable  pulse in the brachial artery distal to the anastomosis.   The wound was irrigated. Surgicel was placed. The wound was then closed with a running 3-0 Vicryl and a 4-0 Monocryl. Dermabond was placed as a dressing. The patient tolerated the procedure well and was taken to the recovery room in stable condition.    ____________________________ Algernon Huxley, MD jsd:lt D: 04-Nov-202015 15:33:45 ET T: 04-Nov-202015 15:49:59 ET JOB#: [id number]  cc: Algernon Huxley, MD, <Dictator> Algernon Huxley MD ELECTRONICALLY SIGNED 11/08/2013 14:09

## 2014-07-22 NOTE — Op Note (Signed)
PATIENT NAME:  [name], [name] MR#:  [id number] DATE OF BIRTH:  1930-11-26  DATE OF PROCEDURE:  12/21/2013  PREOPERATIVE DIAGNOSES: 1.  Complication of dialysis device with poor function of the tunneled catheter.  2.  End-stage renal disease requiring hemodialysis.   POSTOPERATIVE DIAGNOSES: 1.  Complication of dialysis device with poor function of the tunneled catheter.  2.  End-stage renal disease requiring hemodialysis.   PROCEDURE PERFORMED:  TPA infusion of the tunneled dialysis catheter for catheter clearance.   SURGEON: Katha Cabal, MD   DESCRIPTION OF PROCEDURE:  The patient is brought to the special procedures holding area. Attempted aspiration of the lumens of her catheter demonstrates poor flow, and 2 mg of tPA is reconstituted in 50 mL. Two aliquots are created, and simultaneous infusion through both lumens of the tunneled catheter is performed over 2-1/2 hours.   At the completion of the procedure, both lumens aspirate quite easily and flush well. They are then packed with 10,000 units of heparin reconstituting in 5 mL, 2.5 mL placed in each lumen.   The patient tolerated the procedure well. There were no immediate complications. She will return for her usual dialysis.    ____________________________ Katha Cabal, MD ggs:nb D: 12/21/2013 17:51:05 ET T: 12/21/2013 23:25:29 ET JOB#: [id number]  cc: Katha Cabal, MD, <Dictator> Katha Cabal MD ELECTRONICALLY SIGNED 01/16/2014 20:44

## 2014-07-22 NOTE — Op Note (Signed)
PATIENT NAME:  [name], [name] MR#:  [id number] DATE OF BIRTH:  08-14-1930  DATE OF PROCEDURE:  01/18/2014  DATE OF BIRTH: 11/25/1960.   DATE OF PROCEDURE: 01/18/2014.   PREOPERATIVE DIAGNOSES: 1.  End-stage renal disease.  2.  Poorly-functioning left arm arteriovenous fistula.  POSTOPERATIVE DIAGNOSES:  1.  End-stage renal disease.  2.  Poorly-functioning left arm arteriovenous fistula.  PROCEDURES: 1.  Ultrasound guidance for vascular access to left brachiocephalic arteriovenous fistula in a retrograde fashion.  2.  Left upper extremity fistulogram and central venogram.  3.  Percutaneous transluminal angioplasty of perianastomotic stenosis with 6 mm diameter drug-coated angioplasty balloon and 8 mm diameter high-pressure angioplasty balloon.  4.  Percutaneous transluminal angioplasty of mid upper arm cephalic vein with 8 and 10 mm diameter angioplasty balloon.   SURGEON: Algernon Huxley, M.D.   ANESTHESIA: Local with moderate conscious sedation.   ESTIMATED BLOOD LOSS: 25 mL.   INDICATION FOR PROCEDURE: This is an 81 year old female with end-stage renal disease. Her left brachiocephalic AV fistula has developed a significant hematoma. Noninvasive study showed a high-grade stenosis of the area of hematoma at the access site in the mid upper arm. There was also an area of moderate to severely elevated velocities near the anastomosis representing a perianastomotic stenosis. She is brought in for a fistulogram for further evaluation and potential treatment of these lesions. Risks and benefits were discussed. Informed consent was obtained.   DESCRIPTION OF PROCEDURE: The patient was brought to the vascular suite. The left upper extremity was sterilely prepped and draped, and a sterile surgical field was created.   The fistula was accessed in the proximal upper arm in a retrograde fashion under direct ultrasound guidance without difficulty with a micropuncture needle, and a permanent image  was recorded. A micropuncture wire and sheath were then placed. We upsized to a 6 [place] sheath. Imaging showed about a 70% to 75% stenosis in the perianastomotic region just into the cephalic vein portion of the fistula. There was then normal fistula flow for about 6-8 cm, and then a separate and distinct high-grade lesion in the 90% range corresponding with the area of the hematoma was seen. The remainder of the outflow cephalic vein and through the central venous circulation was normal. We then crossed the lesion without difficulty with a Magic torque wire and a Kumpe catheter, confirmed intraluminal flow in the brachial artery with a Kumpe catheter and then replaced the wire. A 6 mm diameter Lutonix drug-coated angioplasty balloon was inflated at the perianastomotic region to treat this stenosis. Inflation was held for one minute. I then upsized to an 8 mm diameter high-pressure angioplasty balloon in this area and completion angiogram showed markedly improved flow with only about a 10% to 15% residual stenosis. I then turned my attention to the lesion in the mid upper arm cephalic vein. I initially treated this with an 8 mm diameter angioplasty balloon with suboptimal result with about a 60% residual stenosis. I then upsized to a 10 mm diameter angioplasty balloon  inflated to 11 atmospheres and held this inflation for one minute, and completion angiogram following this showed about a 30% residual stenosis that was no longer flow limiting. I elected to not treat this lesion with any larger balloon or stent at this time, and we will plan to see her in the office in a couple of weeks with an ultrasound.   The sheath was removed, 4-0 Monocryl pursestring sutures was placed. Pressure was held. Sterile dressings were  placed. The patient tolerated the procedure well and was taken to the recovery room in stable condition.    ____________________________ Algernon Huxley, MD jsd:jp D: 01/18/2014 10:08:55  ET T: 01/18/2014 10:23:18 ET JOB#: [id number]  cc: Algernon Huxley, MD, <Dictator> Algernon Huxley MD ELECTRONICALLY SIGNED 01/26/2014 15:25

## 2014-07-25 DIAGNOSIS — D509 Iron deficiency anemia, unspecified: Secondary | ICD-10-CM | POA: Diagnosis not present

## 2014-07-25 DIAGNOSIS — N186 End stage renal disease: Secondary | ICD-10-CM | POA: Diagnosis not present

## 2014-07-25 DIAGNOSIS — D631 Anemia in chronic kidney disease: Secondary | ICD-10-CM | POA: Diagnosis not present

## 2014-07-25 DIAGNOSIS — Z992 Dependence on renal dialysis: Secondary | ICD-10-CM | POA: Diagnosis not present

## 2014-07-26 DIAGNOSIS — M79676 Pain in unspecified toe(s): Secondary | ICD-10-CM | POA: Insufficient documentation

## 2014-07-26 NOTE — Progress Notes (Signed)
Patient ID: CHINAZA SADRI, female   DOB: 1930/07/16, 81 y.o.   MRN: [id number]   The date is 07/19/2014.  Level of care skilled.  Facility [facility].  This is an acute visit.  Chief complaint acute visit secondary to left great toe pain.  History of present illness.  Patient is a pleasant 81 year old female who has asked me  to look at her left great toe saying it hurts some-the history she gives that her left great toenail was recently clipped by the foot doctor and now feels somewhat sore  Physical exam.  Left great toe was assessed -I do not see any edema or erythema I do not note  tenderness to palpation around the nailbed I also do not note any drainage  or sign of infection-there is some mild tenderness to palpation under the distal nail --but again I do not see any drainage or bleeding or edema.  Assessment plan.  Left great toe pain-at this point I do not see signs of infection however this will have to be monitored closely-I have spoken with nursing about a topical treatment topical antibiotic cream to area-monitor for any changes.  [id number]

## 2014-07-27 DIAGNOSIS — D509 Iron deficiency anemia, unspecified: Secondary | ICD-10-CM | POA: Diagnosis not present

## 2014-07-27 DIAGNOSIS — D631 Anemia in chronic kidney disease: Secondary | ICD-10-CM | POA: Diagnosis not present

## 2014-07-27 DIAGNOSIS — N186 End stage renal disease: Secondary | ICD-10-CM | POA: Diagnosis not present

## 2014-07-27 DIAGNOSIS — Z992 Dependence on renal dialysis: Secondary | ICD-10-CM | POA: Diagnosis not present

## 2014-07-29 DIAGNOSIS — D631 Anemia in chronic kidney disease: Secondary | ICD-10-CM | POA: Diagnosis not present

## 2014-07-29 DIAGNOSIS — Z992 Dependence on renal dialysis: Secondary | ICD-10-CM | POA: Diagnosis not present

## 2014-07-29 DIAGNOSIS — N186 End stage renal disease: Secondary | ICD-10-CM | POA: Diagnosis not present

## 2014-07-29 DIAGNOSIS — D509 Iron deficiency anemia, unspecified: Secondary | ICD-10-CM | POA: Diagnosis not present

## 2014-07-30 NOTE — Op Note (Signed)
PATIENT NAME:  [name], [name] MR#:  [id number] DATE OF BIRTH:  March 10, 1931  DATE OF PROCEDURE:  04/10/2014  PREOPERATIVE DIAGNOSIS: Functional left upper extremity AV fistula with endstage renal disease.  POSTOPERATIVE DIAGNOSIS: Functional left upper extremity AV fistula with endstage renal disease.  PROCEDURE: Removal of right jugular PermCath.  SURGEON:  Algernon Huxley, MD  ANESTHESIA:  Local.  ESTIMATED BLOOD LOSS:  Minimal.  INDICATION FOR PROCEDURE:  An 81 year old female with a left upper extremity fistula, which is now functional and can be used for her dialysis access needs. As such, we can remove her PermCath.   Risks and benefits were discussed and informed consent was obtained.   DESCRIPTION OF PROCEDURE:  The patient's right neck, chest, and existing catheter were sterilely prepped and draped.  The area around the catheter was anesthetized copiously with 1% Lidocaine. The catheter was dissected out with curved hemostats until the cuff was freed from the surrounding fibrous sheath.  The fibrous sheath was transected and the catheter was then removed in its entirety using gentle traction.  Pressure was held and sterile dressings placed.    The patient tolerated the procedure well and was taken to the recovery room in stable condition.  ____________________________ Algernon Huxley, MD jsd:sb D: 04/10/2014 09:50:14 ET T: 04/10/2014 10:13:06 ET JOB#: [id number]  cc: Algernon Huxley, MD, <Dictator> Algernon Huxley MD ELECTRONICALLY SIGNED 04/11/2014 13:39

## 2014-08-01 DIAGNOSIS — N186 End stage renal disease: Secondary | ICD-10-CM | POA: Diagnosis not present

## 2014-08-01 DIAGNOSIS — Z992 Dependence on renal dialysis: Secondary | ICD-10-CM | POA: Diagnosis not present

## 2014-08-01 DIAGNOSIS — D631 Anemia in chronic kidney disease: Secondary | ICD-10-CM | POA: Diagnosis not present

## 2014-08-03 DIAGNOSIS — D631 Anemia in chronic kidney disease: Secondary | ICD-10-CM | POA: Diagnosis not present

## 2014-08-03 DIAGNOSIS — N186 End stage renal disease: Secondary | ICD-10-CM | POA: Diagnosis not present

## 2014-08-03 DIAGNOSIS — Z992 Dependence on renal dialysis: Secondary | ICD-10-CM | POA: Diagnosis not present

## 2014-08-05 DIAGNOSIS — Z992 Dependence on renal dialysis: Secondary | ICD-10-CM | POA: Diagnosis not present

## 2014-08-05 DIAGNOSIS — N186 End stage renal disease: Secondary | ICD-10-CM | POA: Diagnosis not present

## 2014-08-05 DIAGNOSIS — D631 Anemia in chronic kidney disease: Secondary | ICD-10-CM | POA: Diagnosis not present

## 2014-08-08 DIAGNOSIS — Z992 Dependence on renal dialysis: Secondary | ICD-10-CM | POA: Diagnosis not present

## 2014-08-08 DIAGNOSIS — D631 Anemia in chronic kidney disease: Secondary | ICD-10-CM | POA: Diagnosis not present

## 2014-08-08 DIAGNOSIS — N186 End stage renal disease: Secondary | ICD-10-CM | POA: Diagnosis not present

## 2014-08-10 DIAGNOSIS — Z992 Dependence on renal dialysis: Secondary | ICD-10-CM | POA: Diagnosis not present

## 2014-08-10 DIAGNOSIS — D631 Anemia in chronic kidney disease: Secondary | ICD-10-CM | POA: Diagnosis not present

## 2014-08-10 DIAGNOSIS — N186 End stage renal disease: Secondary | ICD-10-CM | POA: Diagnosis not present

## 2014-08-12 DIAGNOSIS — Z992 Dependence on renal dialysis: Secondary | ICD-10-CM | POA: Diagnosis not present

## 2014-08-12 DIAGNOSIS — N186 End stage renal disease: Secondary | ICD-10-CM | POA: Diagnosis not present

## 2014-08-12 DIAGNOSIS — D631 Anemia in chronic kidney disease: Secondary | ICD-10-CM | POA: Diagnosis not present

## 2014-08-14 ENCOUNTER — Other Ambulatory Visit: Payer: Self-pay | Admitting: *Deleted

## 2014-08-14 MED ORDER — ACETAMINOPHEN-CODEINE #3 300-30 MG PO TABS: 1.0000 | ORAL_TABLET | Freq: Four times a day (QID) | ORAL | Status: DC | PRN

## 2014-08-14 NOTE — Telephone Encounter (Signed)
[place]

## 2014-08-15 DIAGNOSIS — Z992 Dependence on renal dialysis: Secondary | ICD-10-CM | POA: Diagnosis not present

## 2014-08-15 DIAGNOSIS — N186 End stage renal disease: Secondary | ICD-10-CM | POA: Diagnosis not present

## 2014-08-15 DIAGNOSIS — D631 Anemia in chronic kidney disease: Secondary | ICD-10-CM | POA: Diagnosis not present

## 2014-08-17 DIAGNOSIS — N186 End stage renal disease: Secondary | ICD-10-CM | POA: Diagnosis not present

## 2014-08-17 DIAGNOSIS — D631 Anemia in chronic kidney disease: Secondary | ICD-10-CM | POA: Diagnosis not present

## 2014-08-17 DIAGNOSIS — Z992 Dependence on renal dialysis: Secondary | ICD-10-CM | POA: Diagnosis not present

## 2014-08-19 DIAGNOSIS — D631 Anemia in chronic kidney disease: Secondary | ICD-10-CM | POA: Diagnosis not present

## 2014-08-19 DIAGNOSIS — Z992 Dependence on renal dialysis: Secondary | ICD-10-CM | POA: Diagnosis not present

## 2014-08-19 DIAGNOSIS — N186 End stage renal disease: Secondary | ICD-10-CM | POA: Diagnosis not present

## 2014-08-22 DIAGNOSIS — D631 Anemia in chronic kidney disease: Secondary | ICD-10-CM | POA: Diagnosis not present

## 2014-08-22 DIAGNOSIS — Z992 Dependence on renal dialysis: Secondary | ICD-10-CM | POA: Diagnosis not present

## 2014-08-22 DIAGNOSIS — N186 End stage renal disease: Secondary | ICD-10-CM | POA: Diagnosis not present

## 2014-08-23 ENCOUNTER — Non-Acute Institutional Stay (SKILLED_NURSING_FACILITY): Payer: Medicare Other | Admitting: Internal Medicine

## 2014-08-23 ENCOUNTER — Encounter: Payer: Self-pay | Admitting: Internal Medicine

## 2014-08-23 DIAGNOSIS — F0390 Unspecified dementia without behavioral disturbance: Secondary | ICD-10-CM | POA: Diagnosis not present

## 2014-08-23 DIAGNOSIS — B351 Tinea unguium: Secondary | ICD-10-CM | POA: Diagnosis not present

## 2014-08-23 DIAGNOSIS — N186 End stage renal disease: Secondary | ICD-10-CM | POA: Diagnosis not present

## 2014-08-23 DIAGNOSIS — M79674 Pain in right toe(s): Secondary | ICD-10-CM | POA: Diagnosis not present

## 2014-08-23 DIAGNOSIS — E119 Type 2 diabetes mellitus without complications: Secondary | ICD-10-CM

## 2014-08-23 DIAGNOSIS — I5030 Unspecified diastolic (congestive) heart failure: Secondary | ICD-10-CM | POA: Diagnosis not present

## 2014-08-23 DIAGNOSIS — Z992 Dependence on renal dialysis: Secondary | ICD-10-CM

## 2014-08-23 DIAGNOSIS — M79675 Pain in left toe(s): Secondary | ICD-10-CM | POA: Diagnosis not present

## 2014-08-23 DIAGNOSIS — E1159 Type 2 diabetes mellitus with other circulatory complications: Secondary | ICD-10-CM | POA: Diagnosis not present

## 2014-08-23 NOTE — Progress Notes (Signed)
Patient ID: [name], female   DOB: [birth year], 81 y.o.   MRN: [id number]      This is a discharge note.  Level care skilled.  [place]   Chief complaint- Discharge note  HISTORY OF PRESENT ILLNESS: This is an 81 year-old woman who was admitted to hospital from 08/24/2013 through 08/27/2013.     , she was admitted on this occasion with frequent falls for 4-5 days and generalized weakness .   She was admitted to hospital with a creatinine of 6.87, a BUN of 85. A urinalysis was suggestive of a UTI. She was admitted to hospital with acute-on-chronic renal failure. She was managed by intravenous fluids and then restarted on diuretics, transitioned to oral Demadex.      Echocardiogram showed an EF of 0000000, grade 2 diastolic dysfunction, moderate left atrial enlargement, very mild aortic stenosis, moderate tricuspid regurgitation with a pulmonary artery systolic pressure of 54, moderate posterior pericardial effusion   She continues on torsemide-.  She has been started on dialysis and she appears to be tolerating this well  iin regards to diabetes she is onTradjenta-CBGs appear to  to run largely in the mid lower 100s in the morning-somewhat more variable in the afternoon ranging from the lower 100s to occasionally a bit above 200 but this is not consistent generally this appears to be stable  When I saw her recently she did complain of some left have pain was thought possibly this was neuropathic-x-rays were nondiagnostic-this is been evaluated apparently by dialysis and they have okayed her to have her Neurontin increased to 300 mg twice a day--she still complains of some numbness on occasion---   Tonight she has no complaints  Looks forward to going home she will have help at home from the  [place] she will also have home health PT and OT to follow up per strengthening-as well as nursing support.  She has a 4 wheeled rolling walker and does  well with this.    Marland Kitchen   PAST MEDICAL HISTORY/PROBLEM LIST:   Acute-on-chronic renal failure stage IV.   Anemia of chronic renal disease.   Type 2 diabetes.   Hyperlipidemia.   History of CABG, patient states in 2009.   Mild dementia.   Gait ataxia with frequent falls.   Uremia.   Hyperkalemia.   Unspecified thrombocytopenia.  CURRENT MEDICATIONS: reviewed per [facility] Medications include Tylenol with codeine 300-30 every 8 hours when necessary.  Aricept 5 mg daily at bedtime.  Calcitriol administered on mornings days of dialysis.  Cilostazol-100 mg on twice a day.  Colace 100 mg twice a day when necessary.  Crestor 5 mg daily at bedtime.  Demadex 50 mg daily.  Dialyvite once a day.  Drisdol 50,000 units once a day on Saturday.  Erythromycin eye ointment.  Ferrous sulfate 325 mg twice a day.  Neurontin 300 mg twice a day.  Murol ophthalmology solution  Renvela area  Cervix oh when necessary.  Cogentin 5 mg daily.  Ultram 50 mg every 12 hours when necessary.  .  :     .  SOCIAL HISTORY:  HOUSING: The patient states she livedon her own at [employer], which is independent apartments.--Has been fairly independent previously   .   FAMILY HISTORY: She does not carry any family history of diabetes.  CHILDREN: She had one daughter who died of cancer.   REVIEW OF SYSTEMS: Gen. No complaints of fever chills.  Head ears eyes nose mouth and throat  does not complain of visual changes or sore throat.    CHEST/RESPIRATORY: Clear air entry bilaterally. She is not complaining of shortness of breath.  CARDIAC: No clear chest pain or palpitations.  GI: No nausea, vomiting or diarrhea.or abdominal pain  GU: No urinary difficulties.--is now on dialysis  Muscle skeletal does not really complain of joint pain   NEUROLOGICAL:  Does not complain of headache or dizziness does complain of some  possible neuropathic pain in her left hand-appears this has improvedstill has it on occasion   PHYSICAL EXAMINATION:    Temperature is pending pulse is 80 respirations 18 blood pressure 106/65     GENERAL APPEARANCE: The patient is pleasant, cooperative, alert, and conversational. Her skin is warm and dry- -  Oropharynx clear mucous membranes moist  CHEST/RESPIRATORY:clear to auscultation no labored breathing .  CARDIOVASCULAR:  CARDIAC: 2/6 midsystolic murmur --regular rate and rhythm with occasional irregular beat--she has mild lower extremity edema --this is quite minimal this evening GASTROINTESTINAL:  Abdomen is soft nontender positive bowel sounds  Muscle skeletal-moves all extremities 4 does ambulate fairly well with her walker she ambulates  quite a bit about facility.   s  NEUROLOGICAL:--neurologic could not appreciate any lateralizing findings her speech is clear cranial nerves are grossly intact she is able to ambulate with a walker .   Marland Kitchen  PSYCHIATRIC:  MENTAL STATUS: The patient is alert, conversational, can provide most of her history.  I suspect the dementia as mild is probably correct  Labs.  02/06/2015.  Cholesterol 137-triglycerides 67-HDL 64-LDL 60-.  Hemoglobin A1c 7.3.  WBC 5.8 hemoglobin 10.3 platelets 190.  Sodium 136 potassium 5 BUN 38 creatinine 3.35.  Albumin 3.3 otherwise liver function tests within normal limits  08/30/2013.  WBC 5.1 hemoglobin 7.4 platelets 108.  Sodium 136 potassium 4.9 BUN 83 creatinine 6.47.   ASSESSMENT/PLAN:  Type 2 diabetes with complications including chronic renal failure which is stage IV, severe peripheral neuropathy--she is now on dialysis-blood sugars appear to be stable on an oral agent.                History of coronary artery disease. Status post CABG. There is no evidence that this is active.   Diastolic heart failure. Echocardiogram dshowed grade 2  diastolic relaxation, mild AS and mild TR. She does have pulmonary hypertension, however--this appears to be relatively stable she is on diureticr.--Labs are followed by nephrology at dialysis   Dementia. I think this is mild.she is functioning well in this setting she is on Aricept.--Which was decreased to 5 mg                                                         hyperlipidemia-she is on Crestor this may be as well for coronary artery disease- panel in November 2015 was unremarkable LDL of 60                                                             Anemia most likely from chronic disease she is on iron.--Again this is followed by dialysis nephrology   g.   gait  ataxia.  She appears to do fairly well with her walker which is encouraging.  Patient will be living alone however she will help help from [facility]--also will need PT and OT for continued strengthening with history of falls although she appears to have gained strength also nursing support for follow-up her medical issues including end-stage renal disease and diabetes-she does have a 4 wheeled rolling walker apparently at home.  [id number] note greater than 30 minutes spent on this discharge summary   le.

## 2014-08-24 DIAGNOSIS — N186 End stage renal disease: Secondary | ICD-10-CM | POA: Diagnosis not present

## 2014-08-24 DIAGNOSIS — Z992 Dependence on renal dialysis: Secondary | ICD-10-CM | POA: Diagnosis not present

## 2014-08-24 DIAGNOSIS — D631 Anemia in chronic kidney disease: Secondary | ICD-10-CM | POA: Diagnosis not present

## 2014-08-26 DIAGNOSIS — Z992 Dependence on renal dialysis: Secondary | ICD-10-CM | POA: Diagnosis not present

## 2014-08-26 DIAGNOSIS — D631 Anemia in chronic kidney disease: Secondary | ICD-10-CM | POA: Diagnosis not present

## 2014-08-26 DIAGNOSIS — N186 End stage renal disease: Secondary | ICD-10-CM | POA: Diagnosis not present

## 2014-08-29 DIAGNOSIS — N186 End stage renal disease: Secondary | ICD-10-CM | POA: Diagnosis not present

## 2014-08-29 DIAGNOSIS — Z992 Dependence on renal dialysis: Secondary | ICD-10-CM | POA: Diagnosis not present

## 2014-08-29 DIAGNOSIS — D631 Anemia in chronic kidney disease: Secondary | ICD-10-CM | POA: Diagnosis not present

## 2015-02-15 IMAGING — CR DG CHEST 2V
2 series · 2 of 2 positions shown · non-contrast
Comparison: Chest radiograph 10/12/2013

CLINICAL DATA: Protocol for 1 week.

EXAM:
CHEST  2 VIEW

[view not recorded (1 of 2)]
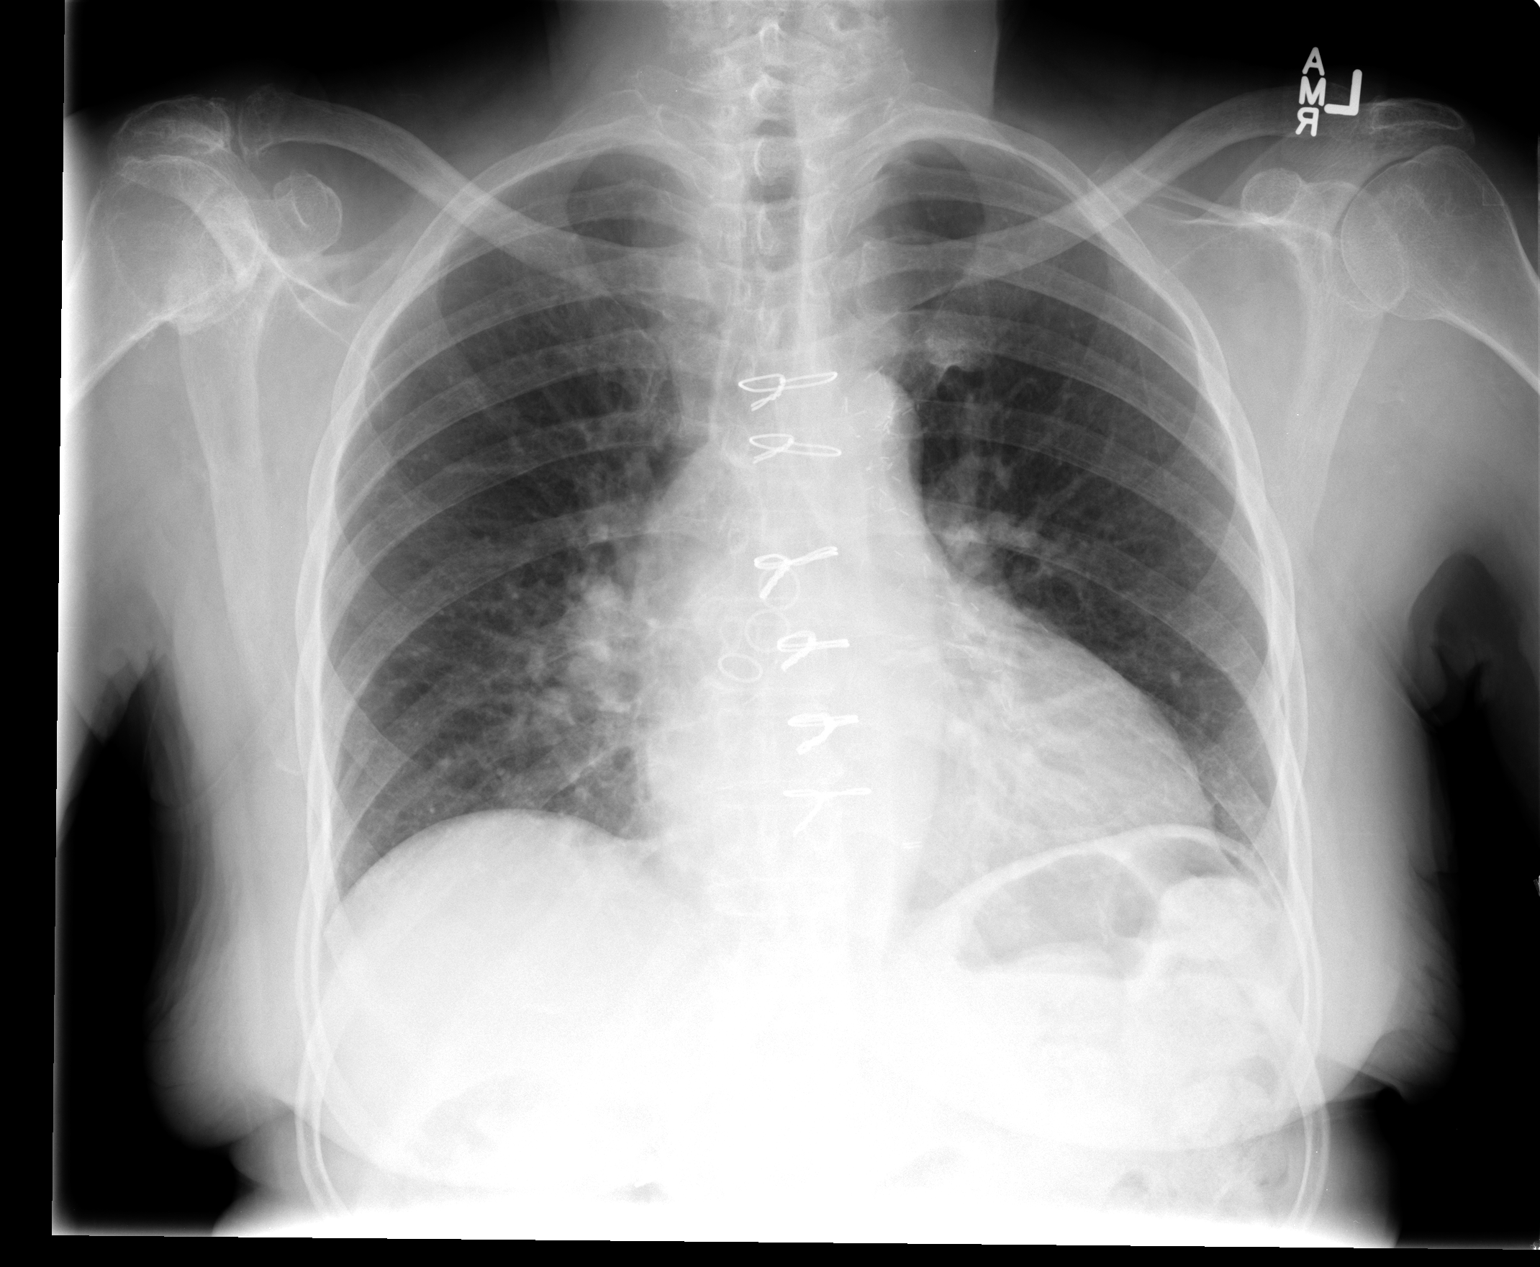

[view not recorded (2 of 2)]
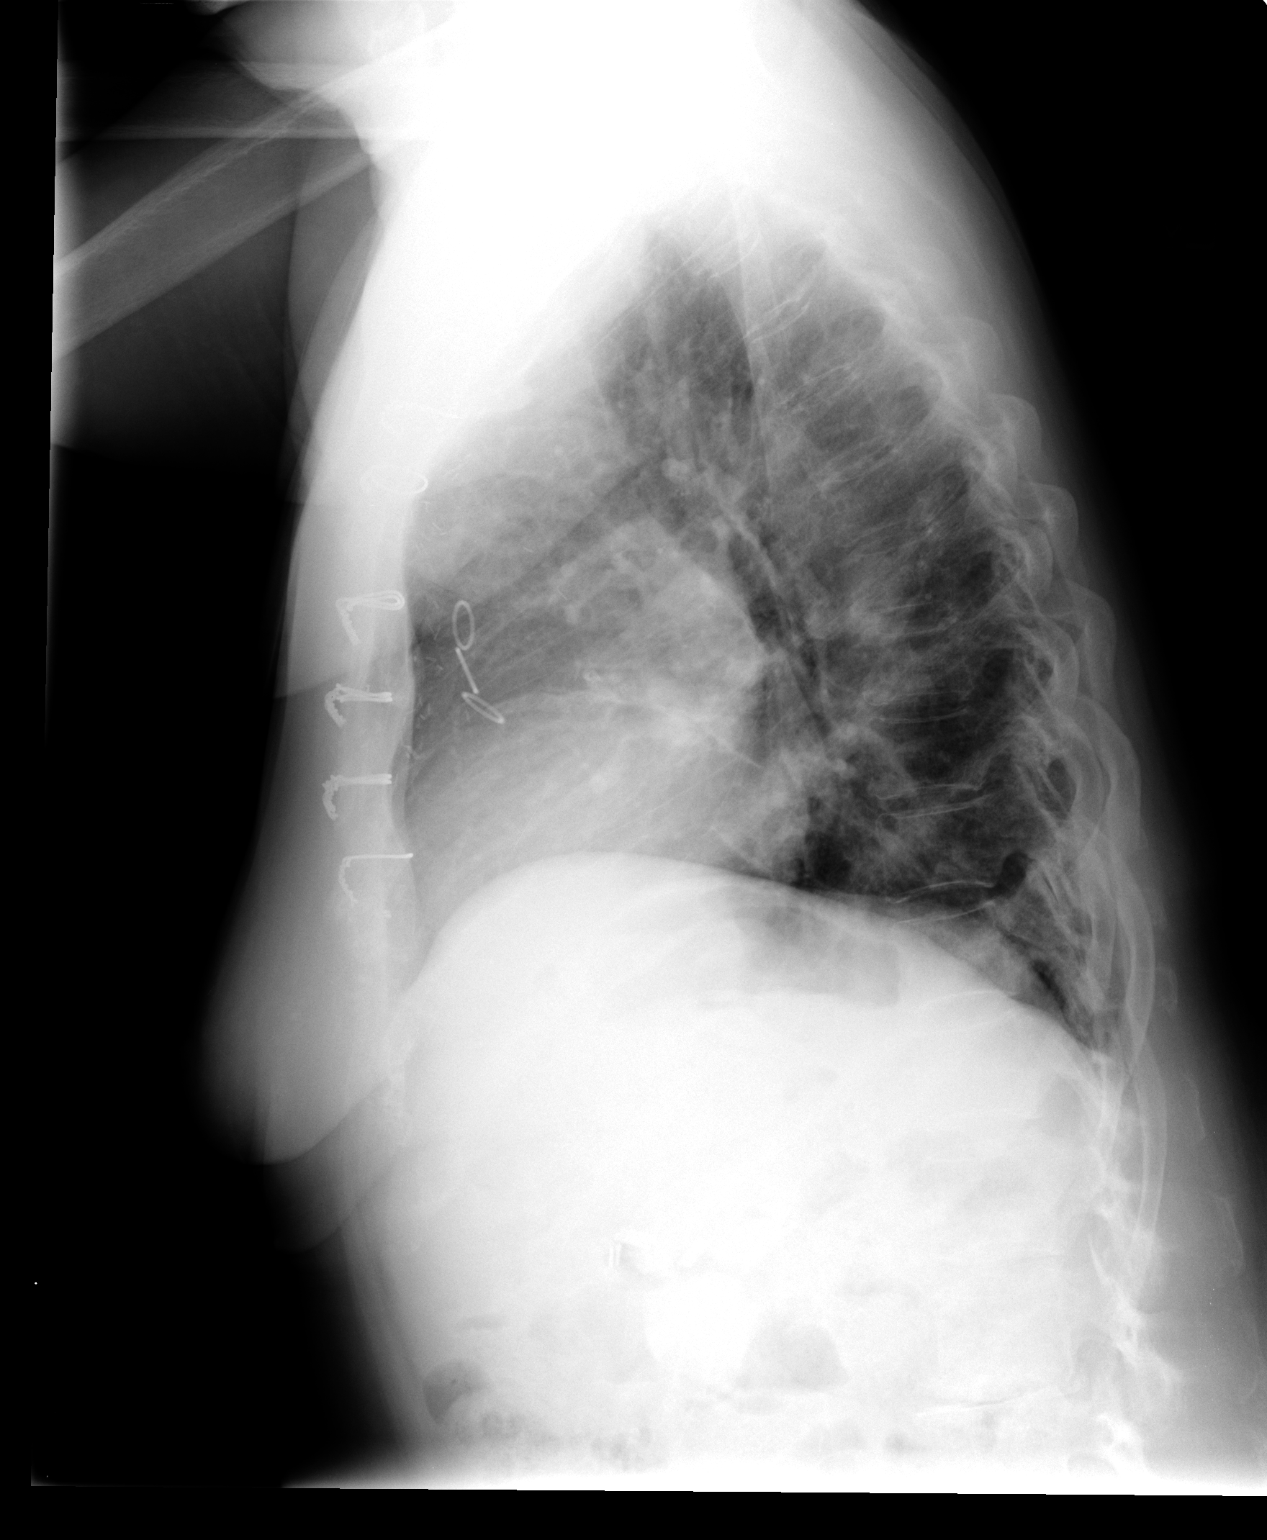

[2 of 2 positions shown; findings below may reference images not displayed]

FINDINGS: Is stable enlarged cardiac and mediastinal contours status post
median sternotomy and CABG procedure. Pulmonary vascular
redistribution. No large consolidative pulmonary opacity. No pleural
effusion or pneumothorax. Right glenohumeral joint degenerative
change.
IMPRESSION: Pulmonary vascular redistribution.  Mild cardiomegaly.

## 2015-03-28 ENCOUNTER — Other Ambulatory Visit: Payer: Self-pay | Admitting: Internal Medicine

## 2015-03-28 ENCOUNTER — Ambulatory Visit (HOSPITAL_COMMUNITY)
Admission: RE | Admit: 2015-03-28 | Discharge: 2015-03-28 | Disposition: A | Discharge status: Home / Self Care | Payer: Medicare (Managed Care) | Source: Ambulatory Visit | Attending: Family Medicine | Admitting: Family Medicine

## 2015-03-28 ENCOUNTER — Other Ambulatory Visit: Payer: Self-pay | Admitting: Family Medicine

## 2015-03-28 DIAGNOSIS — N186 End stage renal disease: Secondary | ICD-10-CM

## 2015-03-28 DIAGNOSIS — T82858A Stenosis of vascular prosthetic devices, implants and grafts, initial encounter: Secondary | ICD-10-CM | POA: Diagnosis present

## 2015-03-28 DIAGNOSIS — Z992 Dependence on renal dialysis: Secondary | ICD-10-CM | POA: Diagnosis not present

## 2015-03-28 DIAGNOSIS — Y832 Surgical operation with anastomosis, bypass or graft as the cause of abnormal reaction of the patient, or of later complication, without mention of misadventure at the time of the procedure: Secondary | ICD-10-CM | POA: Insufficient documentation

## 2015-03-28 MED ORDER — IOHEXOL 300 MG/ML  SOLN
100.0000 mL | Freq: Once | INTRAMUSCULAR | Status: AC | PRN
  Administered 2015-03-28: 60 mL via INTRAVENOUS

## 2015-03-28 MED ORDER — LIDOCAINE HCL 1 % IJ SOLN
INTRAMUSCULAR | Status: AC
  Filled 2015-03-28: qty 20

## 2015-03-28 NOTE — Procedures (Signed)
Technically successful fistulogram with angioplasty.  No immediate complications.  Ronny Bacon, MD Pager #: [phone]

## 2015-07-12 ENCOUNTER — Other Ambulatory Visit: Payer: Self-pay | Admitting: Family Medicine

## 2015-07-12 DIAGNOSIS — R1310 Dysphagia, unspecified: Secondary | ICD-10-CM

## 2015-07-16 ENCOUNTER — Ambulatory Visit
Admission: RE | Admit: 2015-07-16 | Discharge: 2015-07-16 | Disposition: A | Discharge status: Home / Self Care | Payer: Medicare (Managed Care) | Source: Ambulatory Visit | Attending: Family Medicine | Admitting: Family Medicine

## 2015-07-16 DIAGNOSIS — R1310 Dysphagia, unspecified: Secondary | ICD-10-CM

## 2015-10-08 ENCOUNTER — Other Ambulatory Visit (HOSPITAL_COMMUNITY): Payer: Self-pay | Admitting: Family Medicine

## 2015-10-08 DIAGNOSIS — N186 End stage renal disease: Secondary | ICD-10-CM

## 2015-10-09 ENCOUNTER — Other Ambulatory Visit (HOSPITAL_COMMUNITY): Payer: Self-pay | Admitting: Family Medicine

## 2015-10-09 ENCOUNTER — Ambulatory Visit (HOSPITAL_COMMUNITY)
Admission: RE | Admit: 2015-10-09 | Discharge: 2015-10-09 | Disposition: A | Discharge status: Home / Self Care | Payer: Medicare (Managed Care) | Source: Ambulatory Visit | Attending: Family Medicine | Admitting: Family Medicine

## 2015-10-09 ENCOUNTER — Encounter (HOSPITAL_COMMUNITY): Payer: Self-pay

## 2015-10-09 DIAGNOSIS — I129 Hypertensive chronic kidney disease with stage 1 through stage 4 chronic kidney disease, or unspecified chronic kidney disease: Secondary | ICD-10-CM | POA: Diagnosis not present

## 2015-10-09 DIAGNOSIS — N189 Chronic kidney disease, unspecified: Secondary | ICD-10-CM | POA: Diagnosis not present

## 2015-10-09 DIAGNOSIS — D638 Anemia in other chronic diseases classified elsewhere: Secondary | ICD-10-CM | POA: Diagnosis not present

## 2015-10-09 DIAGNOSIS — M1990 Unspecified osteoarthritis, unspecified site: Secondary | ICD-10-CM | POA: Insufficient documentation

## 2015-10-09 DIAGNOSIS — Y832 Surgical operation with anastomosis, bypass or graft as the cause of abnormal reaction of the patient, or of later complication, without mention of misadventure at the time of the procedure: Secondary | ICD-10-CM | POA: Diagnosis not present

## 2015-10-09 DIAGNOSIS — Z992 Dependence on renal dialysis: Secondary | ICD-10-CM | POA: Insufficient documentation

## 2015-10-09 DIAGNOSIS — Z951 Presence of aortocoronary bypass graft: Secondary | ICD-10-CM | POA: Diagnosis not present

## 2015-10-09 DIAGNOSIS — F0390 Unspecified dementia without behavioral disturbance: Secondary | ICD-10-CM | POA: Insufficient documentation

## 2015-10-09 DIAGNOSIS — N186 End stage renal disease: Secondary | ICD-10-CM

## 2015-10-09 DIAGNOSIS — E7800 Pure hypercholesterolemia, unspecified: Secondary | ICD-10-CM | POA: Insufficient documentation

## 2015-10-09 DIAGNOSIS — T82898A Other specified complication of vascular prosthetic devices, implants and grafts, initial encounter: Secondary | ICD-10-CM | POA: Diagnosis present

## 2015-10-09 DIAGNOSIS — T82868A Thrombosis of vascular prosthetic devices, implants and grafts, initial encounter: Secondary | ICD-10-CM | POA: Diagnosis not present

## 2015-10-09 DIAGNOSIS — E1122 Type 2 diabetes mellitus with diabetic chronic kidney disease: Secondary | ICD-10-CM | POA: Diagnosis not present

## 2015-10-09 MED ORDER — LIDOCAINE HCL 1 % IJ SOLN
INTRAMUSCULAR | Status: AC
  Filled 2015-10-09: qty 20

## 2015-10-09 MED ORDER — IOPAMIDOL (ISOVUE-300) INJECTION 61%
INTRAVENOUS | Status: AC
  Administered 2015-10-09: 50 mL
  Filled 2015-10-09: qty 100

## 2015-10-09 MED ORDER — LIDOCAINE HCL 1 % IJ SOLN
INTRAMUSCULAR | Status: DC | PRN
  Administered 2015-10-09: 5 mL

## 2015-10-09 NOTE — Procedures (Signed)
Technically successful fistulogram with angioplasty.   EBL: None No immediate complications.  Ronny Bacon, MD Pager #: [phone]

## 2015-10-09 NOTE — H&P (Signed)
Chief Complaint: Patient was seen in consultation today for left arm dialysis access shuntogram with possible angioplasty/stent placement at the request of [name]  Referring Physician(s): [name]  Supervising Physician: [name]  Patient Status: Outpatient  History of Present Illness: [name] is a 82 y.o. female   Noted decreased flows at dialysis last few visits shuntogram today reveals area of stenosis Now scheduled for intervention to include angioplasty and/or stent placement Last intervention same access 03/28/15 Successful   Past Medical History  Diagnosis Date  . Anemia of chronic disease 10/05/2010  . Diabetes mellitus   . Hypertension   . High cholesterol   . Neuropathy ([place])   . Anemia, iron deficiency 08/18/2012  . Shortness of breath   . Arthritis     osteoarthritis  . Chronic kidney disease     Acute on Chronic  . Ataxia involving legs     has had several falls  . Hyperlipidemia   . Dementia     "mild" per MD note  . Family history of anesthesia complication     Nephew- had time waking    Past Surgical History  Procedure Laterality Date  . Heart bypass surgery  2008  . Left eye surgery      cataract  . Eye surgery    . Coronary artery bypass graft  2008  . Insertion of dialysis catheter Left 09/05/2013    Procedure: INSERTION OF DIALYSIS CATHETER;  Surgeon: Stacey Allegany, MD;  Location: [facility];  Service: Vascular;  Laterality: Left;    Allergies: Review of patient's allergies indicates no known allergies.  Medications: Prior to Admission medications   Medication Sig Start Date End Date Taking? Authorizing Provider  acetaminophen-codeine (TYLENOL #3) 300-30 MG per tablet Take 1 tablet by mouth every 6 (six) hours as needed for moderate pain. DO NOT EXCEED 3 GM OF APAP/24 HOURS FROM ALL SOURCES 08/14/14   Stacey Chandler, NP  calcitRIOL (ROCALTROL) 0.25 MCG capsule Take 1 capsule (0.25 mcg total) by mouth daily.  08/27/13   Stacey Dike, MD  cilostazol (PLETAL) 100 MG tablet Take 100 mg by mouth 2 (two) times daily. For CIRCULATION or AS DIRECTED by your physician    Historical Provider, MD  donepezil (ARICEPT) 10 MG tablet Take 5 mg by mouth at bedtime. For MEMORY    Historical Provider, MD  erythromycin ophthalmic ointment Place 1 application into both eyes at bedtime. For keratitis    Historical Provider, MD  ferrous sulfate 325 (65 FE) MG tablet Take 325 mg by mouth 2 (two) times daily. As SUPPLEMENT    Historical Provider, MD  folic acid-vitamin b complex-vitamin c-selenium-zinc (DIALYVITE) 3 MG TABS tablet Take 1 tablet by mouth daily.    Historical Provider, MD  gabapentin (NEURONTIN) 300 MG capsule Take 300 mg by mouth 2 (two) times daily. For NEUROPATHY/NERVE PAIN    Historical Provider, MD  lidocaine-prilocaine (EMLA) cream Apply 1 application topically as needed (On Tues, Thu,and Sat.).    Historical Provider, MD  linagliptin (TRADJENTA) 5 MG TABS tablet Take 5 mg by mouth every morning. For BLOOD SUGARS/DIABETES    Historical Provider, MD  Propylene Glycol (SYSTANE BALANCE) 0.6 % SOLN Apply 1 drop to eye 2 (two) times daily.    Historical Provider, MD  rosuvastatin (CRESTOR) 5 MG tablet Take 5 mg by mouth at bedtime. For CHOLESTEROL    Historical Provider, MD  sevelamer carbonate (RENVELA) 800 MG tablet Take 1 tablet (800 mg  total) by mouth 3 (three) times daily with meals. 08/27/13   Stacey Dike, MD  sodium bicarbonate 650 MG tablet Take 650 mg by mouth 2 (two) times daily.    Historical Provider, MD  torsemide (DEMADEX) 10 MG tablet Take 5 tablets (50 mg total) by mouth daily. 08/27/13   Stacey Dike, MD  Vitamin D, Ergocalciferol, (DRISDOL) 50000 UNITS CAPS capsule Take 1 capsule (50,000 Units total) by mouth every 7 (seven) days. 01/05/13   Stacey Fire, MD     Family History  Problem Relation Age of Onset  . Diabetes Father   . Cancer Sister   . Aneurysm Mother     Social History     Social History  . Marital Status: Single    Spouse Name: N/A  . Number of Children: N/A  . Years of Education: N/A   Social History Main Topics  . Smoking status: Never Smoker   . Smokeless tobacco: Never Used  . Alcohol Use: No  . Drug Use: No  . Sexual Activity: Not Currently    Birth Control/ Protection: Post-menopausal   Other Topics Concern  . None   Social History Narrative     Review of Systems: A 12 point ROS discussed and pertinent positives are indicated in the HPI above.  All other systems are negative.  Review of Systems  Constitutional: Negative for fever, activity change and fatigue.  Respiratory: Positive for cough. Negative for shortness of breath.   Cardiovascular: Negative for chest pain.  Neurological: Negative for weakness.  Psychiatric/Behavioral: Negative for behavioral problems and confusion.    Vital Signs: There were no vitals taken for this visit.  Physical Exam  Constitutional: She is oriented to person, place, and time.  Cardiovascular: Normal rate and regular rhythm.   Pulmonary/Chest: Effort normal and breath sounds normal.  Abdominal: Soft. Bowel sounds are normal.  Musculoskeletal: Normal range of motion.  Left arm dialysis fistula stenosis- slow flows  Neurological: She is alert and oriented to person, place, and time.  Skin: Skin is warm and dry.  Psychiatric: She has a normal mood and affect. Her behavior is normal. Judgment and thought content normal.  Nursing note and vitals reviewed.   Mallampati Score:  MD Evaluation Airway: WNL Heart: WNL Abdomen: WNL Chest/ Lungs: WNL ASA  Classification: 3 Mallampati/Airway Score: Two  Imaging: No results found.  Labs:  CBC: No results for input(s): WBC, HGB, HCT, PLT in the last 8760 hours.  COAGS: No results for input(s): INR, APTT in the last 8760 hours.  BMP: No results for input(s): NA, K, CL, CO2, GLUCOSE, BUN, CALCIUM, CREATININE, GFRNONAA, GFRAA in the last 8760  hours.  Invalid input(s): CMP  LIVER FUNCTION TESTS: No results for input(s): BILITOT, AST, ALT, ALKPHOS, PROT, ALBUMIN in the last 8760 hours.  TUMOR MARKERS: No results for input(s): AFPTM, CEA, CA199, CHROMGRNA in the last 8760 hours.  Assessment and Plan:  Left arm dialysis fistula slow flow Stenosis noted on shuntogram today in IR Now scheduled for angioplasty/possible stent placement Pt aware of procedure benefits and risks including but not limited to Infection, bleeding, damage to fistula Agreeable to proceed Consent signed andin chart  Thank you for this interesting consult.  I greatly enjoyed meeting [name] and look forward to participating in their care.  A copy of this report was sent to the requesting provider on this date.  Electronically Signed: Lagena Strand A 10/09/2015, 9:11 AM   I spent a total of  30 Minutes  in face to face in clinical consultation, greater than 50% of which was counseling/coordinating care for left arm dialysis access intervention

## 2015-12-05 ENCOUNTER — Encounter: Payer: Self-pay | Admitting: Podiatry

## 2015-12-05 ENCOUNTER — Ambulatory Visit (INDEPENDENT_AMBULATORY_CARE_PROVIDER_SITE_OTHER): Payer: Medicare (Managed Care) | Admitting: Podiatry

## 2015-12-05 VITALS — BP 108/44 | HR 87 | Temp 97.8°F | Resp 18

## 2015-12-05 DIAGNOSIS — B351 Tinea unguium: Secondary | ICD-10-CM | POA: Diagnosis not present

## 2015-12-05 DIAGNOSIS — M79675 Pain in left toe(s): Secondary | ICD-10-CM | POA: Diagnosis not present

## 2015-12-05 DIAGNOSIS — R0989 Other specified symptoms and signs involving the circulatory and respiratory systems: Secondary | ICD-10-CM

## 2015-12-05 DIAGNOSIS — L84 Corns and callosities: Secondary | ICD-10-CM | POA: Diagnosis not present

## 2015-12-05 DIAGNOSIS — M79674 Pain in right toe(s): Secondary | ICD-10-CM | POA: Diagnosis not present

## 2015-12-05 NOTE — Progress Notes (Signed)
   Subjective:    Patient ID: [name], female    DOB: Mar 31, 1931, 82 y.o.   MRN: [id number]  HPI      This patient presents today complaining that her toenails are thick and discolored and are comfortable walking wearing shoes and she is requesting toenail debridement. She denies any recent podiatric care Patient also mentions that she has throbbing and burning and tingling in her and her feet and toes more recently. She denies any specific treatment for this problem. Patient is diabetic on dialysis Patient denies history of foot ulceration, claudication or amputation    Review of Systems  All other systems reviewed and are negative.      Objective:   Physical Exam  Orientated 3  Vascular: DP pulse right 1/4 and 0/4 left PT pulses 0/4 bilaterally Capillary reflex delay bilaterally  Neurological: Sensation to 10 g monofilament wire intact 3/5 bilaterally Vibratory sensation nonreactive bilaterally Ankle reflex equal and reactive bilaterally  Dermatological: No open skin lesions bilaterally Keratoses dorsal interphalangeal joint right hallux and lateral fifth right toe The toenails are elongated, brittle, discolored, deformed and tender to direct palpation 6-10  Musculoskeletal: Patient walks slowly with a roller walker HAV bilateral Hammertoe second third bilaterally There is no restriction ankle, subtalar, midtarsal joints bilaterally      Assessment & Plan:   Assessment: Diabetic with peripheral arterial disease Diabetic with peripheral neuropathy Symptomatic onychomycoses 6-10 Keratoses 2  Plan: Debridement of toenails 6-10 mechanically and electric without any bleeding Debrided keratoses 2 without a bleeding  Reappoint 3 months

## 2015-12-05 NOTE — Patient Instructions (Signed)
Diabetes and Foot Care Diabetes may cause you to have problems because of poor blood supply (circulation) to your feet and legs. This may cause the skin on your feet to become thinner, break easier, and heal more slowly. Your skin may become dry, and the skin may peel and crack. You may also have nerve damage in your legs and feet causing decreased feeling in them. You may not notice minor injuries to your feet that could lead to infections or more serious problems. Taking care of your feet is one of the most important things you can do for yourself.  HOME CARE INSTRUCTIONS  Wear shoes at all times, even in the house. Do not go barefoot. Bare feet are easily injured.  Check your feet daily for blisters, cuts, and redness. If you cannot see the bottom of your feet, use a mirror or ask someone for help.  Wash your feet with warm water (do not use hot water) and mild soap. Then pat your feet and the areas between your toes until they are completely dry. Do not soak your feet as this can dry your skin.  Apply a moisturizing lotion or petroleum jelly (that does not contain alcohol and is unscented) to the skin on your feet and to dry, brittle toenails. Do not apply lotion between your toes.  Trim your toenails straight across. Do not dig under them or around the cuticle. File the edges of your nails with an emery board or nail file.  Do not cut corns or calluses or try to remove them with medicine.  Wear clean socks or stockings every day. Make sure they are not too tight. Do not wear knee-high stockings since they may decrease blood flow to your legs.  Wear shoes that fit properly and have enough cushioning. To break in new shoes, wear them for just a few hours a day. This prevents you from injuring your feet. Always look in your shoes before you put them on to be sure there are no objects inside.  Do not cross your legs. This may decrease the blood flow to your feet.  If you find a minor scrape,  cut, or break in the skin on your feet, keep it and the skin around it clean and dry. These areas may be cleansed with mild soap and water. Do not cleanse the area with peroxide, alcohol, or iodine.  When you remove an adhesive bandage, be sure not to damage the skin around it.  If you have a wound, look at it several times a day to make sure it is healing.  Do not use heating pads or hot water bottles. They may burn your skin. If you have lost feeling in your feet or legs, you may not know it is happening until it is too late.  Make sure your health care provider performs a complete foot exam at least annually or more often if you have foot problems. Report any cuts, sores, or bruises to your health care provider immediately. SEEK MEDICAL CARE IF:   You have an injury that is not healing.  You have cuts or breaks in the skin.  You have an ingrown nail.  You notice redness on your legs or feet.  You feel burning or tingling in your legs or feet.  You have pain or cramps in your legs and feet.  Your legs or feet are numb.  Your feet always feel cold. SEEK IMMEDIATE MEDICAL CARE IF:   There is increasing redness,   swelling, or pain in or around a wound.  There is a red line that goes up your leg.  Pus is coming from a wound.  You develop a fever or as directed by your health care provider.  You notice a bad smell coming from an ulcer or wound.   This information is not intended to replace advice given to you by your health care provider. Make sure you discuss any questions you have with your health care provider.   Document Released: 03/14/2000 Document Revised: 11/17/2012 Document Reviewed: 08/24/2012 Elsevier Interactive Patient Education 2016 [employer]  

## 2016-03-12 ENCOUNTER — Encounter: Payer: Self-pay | Admitting: Podiatry

## 2016-03-12 ENCOUNTER — Ambulatory Visit (INDEPENDENT_AMBULATORY_CARE_PROVIDER_SITE_OTHER): Payer: Medicare (Managed Care) | Admitting: Podiatry

## 2016-03-12 ENCOUNTER — Ambulatory Visit: Payer: Medicare (Managed Care) | Admitting: Podiatry

## 2016-03-12 VITALS — BP 133/71 | HR 86 | Resp 18

## 2016-03-12 DIAGNOSIS — R0989 Other specified symptoms and signs involving the circulatory and respiratory systems: Secondary | ICD-10-CM

## 2016-03-12 DIAGNOSIS — B351 Tinea unguium: Secondary | ICD-10-CM

## 2016-03-12 DIAGNOSIS — L84 Corns and callosities: Secondary | ICD-10-CM

## 2016-03-12 DIAGNOSIS — M79674 Pain in right toe(s): Secondary | ICD-10-CM | POA: Diagnosis not present

## 2016-03-12 DIAGNOSIS — M79675 Pain in left toe(s): Secondary | ICD-10-CM

## 2016-03-12 NOTE — Progress Notes (Signed)
Patient ID: [name], female   DOB: 1930-07-14, 82 y.o.   MRN: [id number]   Subjective: This patient presents for a scheduled visit complaining of uncomfortable toenails walking wearing shoes and request toenail debridement. Also, patient complaining of uncomfortable corns on the right and left feet  Objective: Orientated 3  Vascular: DP pulse right 1/4 and 0/4 left PT pulses 0/4 bilaterally Capillary reflex delay bilaterally  Neurological: Sensation to 10 g monofilament wire intact 3/5 bilaterally Vibratory sensation nonreactive bilaterally Ankle reflex equal and reactive bilaterally  Dermatological: No open skin lesions bilaterally Keratoses fifth toes bilaterally The toenails are elongated, brittle, discolored, deformed and tender to direct palpation 6-10  Musculoskeletal: Patient walks slowly with a roller walker HAV bilateral Hammertoe second third bilaterally There is no restriction ankle, subtalar, midtarsal joints bilaterally   Assessment: Diabetic with peripheral arterial disease Diabetic with peripheral neuropathy Symptomatic onychomycoses 6-10 Keratoses 2  Plan: Debridement of toenails 6-10 mechanically and electric without any bleeding Debrided keratoses 2 without any bleeding  Reappoint 3 months

## 2016-03-12 NOTE — Patient Instructions (Signed)
Today you presented for a scheduled visit for debridement of mycotic toenails and corns on the right and left feet. Return every 3 months for skin a nail debridement Gean Birchwood DPM [facility]ried [facility] center 03/12/2016    Diabetes and Foot Care Diabetes may cause you to have problems because of poor blood supply (circulation) to your feet and legs. This may cause the skin on your feet to become thinner, break easier, and heal more slowly. Your skin may become dry, and the skin may peel and crack. You may also have nerve damage in your legs and feet causing decreased feeling in them. You may not notice minor injuries to your feet that could lead to infections or more serious problems. Taking care of your feet is one of the most important things you can do for yourself. Follow these instructions at home:  Wear shoes at all times, even in the house. Do not go barefoot. Bare feet are easily injured.  Check your feet daily for blisters, cuts, and redness. If you cannot see the bottom of your feet, use a mirror or ask someone for help.  Wash your feet with warm water (do not use hot water) and mild soap. Then pat your feet and the areas between your toes until they are completely dry. Do not soak your feet as this can dry your skin.  Apply a moisturizing lotion or petroleum jelly (that does not contain alcohol and is unscented) to the skin on your feet and to dry, brittle toenails. Do not apply lotion between your toes.  Trim your toenails straight across. Do not dig under them or around the cuticle. File the edges of your nails with an emery board or nail file.  Do not cut corns or calluses or try to remove them with medicine.  Wear clean socks or stockings every day. Make sure they are not too tight. Do not wear knee-high stockings since they may decrease blood flow to your legs.  Wear shoes that fit properly and have enough cushioning. To break in new shoes, wear them for just a few hours a day.  This prevents you from injuring your feet. Always look in your shoes before you put them on to be sure there are no objects inside.  Do not cross your legs. This may decrease the blood flow to your feet.  If you find a minor scrape, cut, or break in the skin on your feet, keep it and the skin around it clean and dry. These areas may be cleansed with mild soap and water. Do not cleanse the area with peroxide, alcohol, or iodine.  When you remove an adhesive bandage, be sure not to damage the skin around it.  If you have a wound, look at it several times a day to make sure it is healing.  Do not use heating pads or hot water bottles. They may burn your skin. If you have lost feeling in your feet or legs, you may not know it is happening until it is too late.  Make sure your health care provider performs a complete foot exam at least annually or more often if you have foot problems. Report any cuts, sores, or bruises to your health care provider immediately. Contact a health care provider if:  You have an injury that is not healing.  You have cuts or breaks in the skin.  You have an ingrown nail.  You notice redness on your legs or feet.  You feel burning  or tingling in your legs or feet.  You have pain or cramps in your legs and feet.  Your legs or feet are numb.  Your feet always feel cold. Get help right away if:  There is increasing redness, swelling, or pain in or around a wound.  There is a red line that goes up your leg.  Pus is coming from a wound.  You develop a fever or as directed by your health care provider.  You notice a bad smell coming from an ulcer or wound. This information is not intended to replace advice given to you by your health care provider. Make sure you discuss any questions you have with your health care provider. Document Released: 03/14/2000 Document Revised: 08/23/2015 Document Reviewed: 08/24/2012 Elsevier Interactive Patient Education  2017  [employer].

## 2016-06-11 ENCOUNTER — Ambulatory Visit (INDEPENDENT_AMBULATORY_CARE_PROVIDER_SITE_OTHER): Payer: Medicare (Managed Care) | Admitting: Podiatry

## 2016-06-11 ENCOUNTER — Encounter: Payer: Self-pay | Admitting: Podiatry

## 2016-06-11 VITALS — BP 88/62 | HR 77 | Resp 18

## 2016-06-11 DIAGNOSIS — I739 Peripheral vascular disease, unspecified: Secondary | ICD-10-CM | POA: Diagnosis not present

## 2016-06-11 DIAGNOSIS — M79674 Pain in right toe(s): Secondary | ICD-10-CM

## 2016-06-11 DIAGNOSIS — M79675 Pain in left toe(s): Secondary | ICD-10-CM

## 2016-06-11 DIAGNOSIS — B351 Tinea unguium: Secondary | ICD-10-CM

## 2016-06-11 DIAGNOSIS — E0842 Diabetes mellitus due to underlying condition with diabetic polyneuropathy: Secondary | ICD-10-CM | POA: Diagnosis not present

## 2016-06-11 NOTE — Patient Instructions (Signed)

## 2016-06-11 NOTE — Progress Notes (Signed)
Patient ID: [name], female   DOB: 1930/10/16, 83 y.o.   MRN: [id number]   Subjective: This patient presents for a scheduled visit complaining of uncomfortable toenails walking wearing shoes and request toenail debridement. Also, patient complaining of uncomfortable corns on the right and left feet  Objective: Orientated 3  Vascular: DP pulse right 1/4 and 0/4 left PT pulses 0/4 bilaterally Capillary reflex delay bilaterally  Neurological: Sensation to 10 g monofilament wire intact 3/5 bilaterally Vibratory sensation nonreactive bilaterally Ankle reflex equal and reactive bilaterally  Dermatological: No open skin lesions bilaterally Keratoses fifth toes bilaterally The toenails are elongated, brittle, discolored, deformed and tender to direct palpation 6-10  Musculoskeletal: Patient walks slowly with a roller walker HAV bilateral Hammertoe second third bilaterally There is no restriction ankle, subtalar, midtarsal joints bilaterally   Assessment: Diabetic with peripheral arterial disease Diabetic with peripheral neuropathy Symptomatic onychomycoses 6-10 Keratoses 2  Plan: Debridement of toenails 6-10 mechanically and electric without any bleeding Debrided keratoses 2 without any bleeding  Reappoint 3 months

## 2016-07-03 ENCOUNTER — Emergency Department (HOSPITAL_COMMUNITY): Payer: Medicare (Managed Care)

## 2016-07-03 ENCOUNTER — Inpatient Hospital Stay (HOSPITAL_COMMUNITY)
Admission: EM | Admit: 2016-07-03 | Discharge: 2016-07-10 | DRG: 480 | Disposition: A | Discharge status: Skilled Nursing Facility | Payer: Medicare (Managed Care) | Attending: Internal Medicine | Admitting: Internal Medicine

## 2016-07-03 ENCOUNTER — Encounter (HOSPITAL_COMMUNITY): Payer: Self-pay | Admitting: Emergency Medicine

## 2016-07-03 DIAGNOSIS — S92422P Displaced fracture of distal phalanx of left great toe, subsequent encounter for fracture with malunion: Secondary | ICD-10-CM | POA: Diagnosis not present

## 2016-07-03 DIAGNOSIS — E785 Hyperlipidemia, unspecified: Secondary | ICD-10-CM | POA: Diagnosis present

## 2016-07-03 DIAGNOSIS — S92335G Nondisplaced fracture of third metatarsal bone, left foot, subsequent encounter for fracture with delayed healing: Secondary | ICD-10-CM | POA: Diagnosis not present

## 2016-07-03 DIAGNOSIS — I350 Nonrheumatic aortic (valve) stenosis: Secondary | ICD-10-CM | POA: Diagnosis present

## 2016-07-03 DIAGNOSIS — E1140 Type 2 diabetes mellitus with diabetic neuropathy, unspecified: Secondary | ICD-10-CM | POA: Diagnosis present

## 2016-07-03 DIAGNOSIS — H169 Unspecified keratitis: Secondary | ICD-10-CM | POA: Diagnosis present

## 2016-07-03 DIAGNOSIS — N186 End stage renal disease: Secondary | ICD-10-CM

## 2016-07-03 DIAGNOSIS — R079 Chest pain, unspecified: Secondary | ICD-10-CM | POA: Diagnosis not present

## 2016-07-03 DIAGNOSIS — F0390 Unspecified dementia without behavioral disturbance: Secondary | ICD-10-CM | POA: Diagnosis present

## 2016-07-03 DIAGNOSIS — R5082 Postprocedural fever: Secondary | ICD-10-CM | POA: Diagnosis present

## 2016-07-03 DIAGNOSIS — S72461A Displaced supracondylar fracture with intracondylar extension of lower end of right femur, initial encounter for closed fracture: Secondary | ICD-10-CM | POA: Diagnosis present

## 2016-07-03 DIAGNOSIS — I480 Paroxysmal atrial fibrillation: Secondary | ICD-10-CM | POA: Diagnosis present

## 2016-07-03 DIAGNOSIS — S7292XA Unspecified fracture of left femur, initial encounter for closed fracture: Secondary | ICD-10-CM

## 2016-07-03 DIAGNOSIS — I272 Pulmonary hypertension, unspecified: Secondary | ICD-10-CM | POA: Diagnosis present

## 2016-07-03 DIAGNOSIS — S92345A Nondisplaced fracture of fourth metatarsal bone, left foot, initial encounter for closed fracture: Secondary | ICD-10-CM | POA: Diagnosis present

## 2016-07-03 DIAGNOSIS — W19XXXA Unspecified fall, initial encounter: Secondary | ICD-10-CM | POA: Diagnosis not present

## 2016-07-03 DIAGNOSIS — M1710 Unilateral primary osteoarthritis, unspecified knee: Secondary | ICD-10-CM | POA: Diagnosis present

## 2016-07-03 DIAGNOSIS — N2581 Secondary hyperparathyroidism of renal origin: Secondary | ICD-10-CM | POA: Diagnosis present

## 2016-07-03 DIAGNOSIS — R270 Ataxia, unspecified: Secondary | ICD-10-CM | POA: Diagnosis present

## 2016-07-03 DIAGNOSIS — I82401 Acute embolism and thrombosis of unspecified deep veins of right lower extremity: Secondary | ICD-10-CM | POA: Diagnosis present

## 2016-07-03 DIAGNOSIS — Z992 Dependence on renal dialysis: Secondary | ICD-10-CM

## 2016-07-03 DIAGNOSIS — S92325A Nondisplaced fracture of second metatarsal bone, left foot, initial encounter for closed fracture: Secondary | ICD-10-CM | POA: Diagnosis present

## 2016-07-03 DIAGNOSIS — S92335A Nondisplaced fracture of third metatarsal bone, left foot, initial encounter for closed fracture: Secondary | ICD-10-CM | POA: Diagnosis present

## 2016-07-03 DIAGNOSIS — S72402A Unspecified fracture of lower end of left femur, initial encounter for closed fracture: Secondary | ICD-10-CM | POA: Diagnosis not present

## 2016-07-03 DIAGNOSIS — Z9889 Other specified postprocedural states: Secondary | ICD-10-CM | POA: Diagnosis not present

## 2016-07-03 DIAGNOSIS — T148XXA Other injury of unspecified body region, initial encounter: Secondary | ICD-10-CM

## 2016-07-03 DIAGNOSIS — E7800 Pure hypercholesterolemia, unspecified: Secondary | ICD-10-CM | POA: Diagnosis not present

## 2016-07-03 DIAGNOSIS — S92402A Displaced unspecified fracture of left great toe, initial encounter for closed fracture: Secondary | ICD-10-CM | POA: Diagnosis present

## 2016-07-03 DIAGNOSIS — R072 Precordial pain: Secondary | ICD-10-CM | POA: Diagnosis not present

## 2016-07-03 DIAGNOSIS — R071 Chest pain on breathing: Secondary | ICD-10-CM | POA: Diagnosis not present

## 2016-07-03 DIAGNOSIS — Z794 Long term (current) use of insulin: Secondary | ICD-10-CM

## 2016-07-03 DIAGNOSIS — D62 Acute posthemorrhagic anemia: Secondary | ICD-10-CM | POA: Diagnosis not present

## 2016-07-03 DIAGNOSIS — M8580 Other specified disorders of bone density and structure, unspecified site: Secondary | ICD-10-CM | POA: Diagnosis present

## 2016-07-03 DIAGNOSIS — Z951 Presence of aortocoronary bypass graft: Secondary | ICD-10-CM

## 2016-07-03 DIAGNOSIS — E1122 Type 2 diabetes mellitus with diabetic chronic kidney disease: Secondary | ICD-10-CM | POA: Diagnosis present

## 2016-07-03 DIAGNOSIS — S92422A Displaced fracture of distal phalanx of left great toe, initial encounter for closed fracture: Secondary | ICD-10-CM | POA: Diagnosis not present

## 2016-07-03 DIAGNOSIS — Z79899 Other long term (current) drug therapy: Secondary | ICD-10-CM

## 2016-07-03 DIAGNOSIS — W1809XA Striking against other object with subsequent fall, initial encounter: Secondary | ICD-10-CM | POA: Diagnosis present

## 2016-07-03 DIAGNOSIS — D631 Anemia in chronic kidney disease: Secondary | ICD-10-CM | POA: Diagnosis present

## 2016-07-03 DIAGNOSIS — S92345K Nondisplaced fracture of fourth metatarsal bone, left foot, subsequent encounter for fracture with nonunion: Secondary | ICD-10-CM | POA: Diagnosis not present

## 2016-07-03 DIAGNOSIS — I120 Hypertensive chronic kidney disease with stage 5 chronic kidney disease or end stage renal disease: Secondary | ICD-10-CM | POA: Diagnosis present

## 2016-07-03 DIAGNOSIS — I471 Supraventricular tachycardia: Secondary | ICD-10-CM

## 2016-07-03 DIAGNOSIS — G8918 Other acute postprocedural pain: Secondary | ICD-10-CM | POA: Diagnosis not present

## 2016-07-03 DIAGNOSIS — I2510 Atherosclerotic heart disease of native coronary artery without angina pectoris: Secondary | ICD-10-CM

## 2016-07-03 DIAGNOSIS — I10 Essential (primary) hypertension: Secondary | ICD-10-CM | POA: Diagnosis not present

## 2016-07-03 DIAGNOSIS — S72462A Displaced supracondylar fracture with intracondylar extension of lower end of left femur, initial encounter for closed fracture: Secondary | ICD-10-CM | POA: Diagnosis not present

## 2016-07-03 DIAGNOSIS — I4719 Other supraventricular tachycardia: Secondary | ICD-10-CM

## 2016-07-03 HISTORY — DX: Other specified symptoms and signs involving the circulatory and respiratory systems: R09.89

## 2016-07-03 HISTORY — DX: End stage renal disease: N18.6

## 2016-07-03 HISTORY — DX: Hypertensive heart disease without heart failure: I11.9

## 2016-07-03 HISTORY — DX: Unspecified osteoarthritis, unspecified site: M19.90

## 2016-07-03 HISTORY — DX: Anemia in chronic kidney disease: D63.1

## 2016-07-03 HISTORY — DX: Type 2 diabetes mellitus with other diabetic kidney complication: E11.29

## 2016-07-03 HISTORY — DX: Chronic diastolic (congestive) heart failure: I50.32

## 2016-07-03 HISTORY — DX: Atherosclerotic heart disease of native coronary artery without angina pectoris: I25.10

## 2016-07-03 HISTORY — DX: End stage renal disease: Z99.2

## 2016-07-03 HISTORY — DX: Chronic kidney disease, unspecified: N18.9

## 2016-07-03 LAB — CBC WITH DIFFERENTIAL/PLATELET
BASOS ABS: 0 10*3/uL (ref 0.0–0.1)
Basophils Relative: 0 %
EOS PCT: 0 %
Eosinophils Absolute: 0 10*3/uL (ref 0.0–0.7)
HCT: 32.4 % — ABNORMAL LOW (ref 36.0–46.0)
HEMOGLOBIN: 10.5 g/dL — AB (ref 12.0–15.0)
LYMPHS ABS: 1.1 10*3/uL (ref 0.7–4.0)
LYMPHS PCT: 10 %
MCH: 29.2 pg (ref 26.0–34.0)
MCHC: 32.4 g/dL (ref 30.0–36.0)
MCV: 90.3 fL (ref 78.0–100.0)
Monocytes Absolute: 0.9 10*3/uL (ref 0.1–1.0)
Monocytes Relative: 8 %
NEUTROS PCT: 82 %
Neutro Abs: 9 10*3/uL — ABNORMAL HIGH (ref 1.7–7.7)
PLATELETS: 185 10*3/uL (ref 150–400)
RBC: 3.59 MIL/uL — AB (ref 3.87–5.11)
RDW: 16.5 % — ABNORMAL HIGH (ref 11.5–15.5)
WBC: 11 10*3/uL — AB (ref 4.0–10.5)

## 2016-07-03 LAB — BASIC METABOLIC PANEL
ANION GAP: 10 (ref 5–15)
BUN: 16 mg/dL (ref 6–20)
CHLORIDE: 91 mmol/L — AB (ref 101–111)
CO2: 34 mmol/L — AB (ref 22–32)
Calcium: 8.8 mg/dL — ABNORMAL LOW (ref 8.9–10.3)
Creatinine, Ser: 3.9 mg/dL — ABNORMAL HIGH (ref 0.44–1.00)
GFR calc non Af Amer: 10 mL/min — ABNORMAL LOW (ref >60)
GFR, EST AFRICAN AMERICAN: 11 mL/min — AB (ref >60)
Glucose, Bld: 201 mg/dL — ABNORMAL HIGH (ref 65–99)
POTASSIUM: 3.7 mmol/L (ref 3.5–5.1)
SODIUM: 135 mmol/L (ref 135–145)

## 2016-07-03 LAB — TROPONIN I

## 2016-07-03 MED ORDER — SEVELAMER CARBONATE 800 MG PO TABS
800.0000 mg | ORAL_TABLET | Freq: Three times a day (TID) | ORAL | Status: DC
  Administered 2016-07-05 – 2016-07-10 (×12): 800 mg via ORAL
  Filled 2016-07-03 (×12): qty 1

## 2016-07-03 MED ORDER — VITAMIN D (ERGOCALCIFEROL) 1.25 MG (50000 UNIT) PO CAPS: 50000.0000 [IU] | ORAL_CAPSULE | ORAL | Status: DC

## 2016-07-03 MED ORDER — INSULIN ASPART 100 UNIT/ML ~~LOC~~ SOLN
0.0000 [IU] | SUBCUTANEOUS | Status: DC
  Administered 2016-07-04 – 2016-07-05 (×7): 2 [IU] via SUBCUTANEOUS
  Administered 2016-07-05: 3 [IU] via SUBCUTANEOUS
  Administered 2016-07-05 – 2016-07-06 (×2): 2 [IU] via SUBCUTANEOUS
  Administered 2016-07-06 (×2): 3 [IU] via SUBCUTANEOUS
  Administered 2016-07-06: 5 [IU] via SUBCUTANEOUS
  Administered 2016-07-06: 1 [IU] via SUBCUTANEOUS
  Administered 2016-07-06: 2 [IU] via SUBCUTANEOUS
  Administered 2016-07-07: 3 [IU] via SUBCUTANEOUS
  Administered 2016-07-07 (×4): 2 [IU] via SUBCUTANEOUS
  Administered 2016-07-08: 3 [IU] via SUBCUTANEOUS
  Administered 2016-07-08: 2 [IU] via SUBCUTANEOUS
  Administered 2016-07-08: 1 [IU] via SUBCUTANEOUS
  Administered 2016-07-08 – 2016-07-09 (×2): 3 [IU] via SUBCUTANEOUS
  Administered 2016-07-10: 1 [IU] via SUBCUTANEOUS

## 2016-07-03 MED ORDER — POLYVINYL ALCOHOL 1.4 % OP SOLN
1.0000 [drp] | Freq: Two times a day (BID) | OPHTHALMIC | Status: DC
  Administered 2016-07-06 – 2016-07-10 (×9): 1 [drp] via OPHTHALMIC
  Filled 2016-07-03 (×2): qty 15

## 2016-07-03 MED ORDER — GABAPENTIN 300 MG PO CAPS
300.0000 mg | ORAL_CAPSULE | Freq: Two times a day (BID) | ORAL | Status: DC
  Administered 2016-07-04 (×2): 300 mg via ORAL
  Filled 2016-07-03 (×2): qty 1

## 2016-07-03 MED ORDER — ERYTHROMYCIN 5 MG/GM OP OINT
1.0000 "application " | TOPICAL_OINTMENT | Freq: Every day | OPHTHALMIC | Status: DC
  Administered 2016-07-06 – 2016-07-10 (×5): 1 via OPHTHALMIC
  Filled 2016-07-03 (×2): qty 3.5

## 2016-07-03 MED ORDER — ACETAMINOPHEN-CODEINE #3 300-30 MG PO TABS
1.0000 | ORAL_TABLET | Freq: Four times a day (QID) | ORAL | Status: DC | PRN
  Administered 2016-07-04 – 2016-07-10 (×3): 1 via ORAL
  Filled 2016-07-03 (×5): qty 1

## 2016-07-03 MED ORDER — TORSEMIDE 20 MG PO TABS
50.0000 mg | ORAL_TABLET | Freq: Every day | ORAL | Status: DC
  Filled 2016-07-03: qty 3

## 2016-07-03 MED ORDER — CALCITRIOL 0.25 MCG PO CAPS
0.2500 ug | ORAL_CAPSULE | Freq: Every day | ORAL | Status: DC
  Administered 2016-07-04: 0.25 ug via ORAL
  Filled 2016-07-03: qty 1

## 2016-07-03 MED ORDER — HYDROCODONE-ACETAMINOPHEN 5-325 MG PO TABS
1.0000 | ORAL_TABLET | Freq: Once | ORAL | Status: AC
  Administered 2016-07-03: 1 via ORAL
  Filled 2016-07-03: qty 1

## 2016-07-03 MED ORDER — CILOSTAZOL 50 MG PO TABS
100.0000 mg | ORAL_TABLET | Freq: Two times a day (BID) | ORAL | Status: DC
  Administered 2016-07-04 (×2): 100 mg via ORAL
  Filled 2016-07-03 (×3): qty 2

## 2016-07-03 MED ORDER — DEXTROSE-NACL 5-0.9 % IV SOLN
INTRAVENOUS | Status: DC
  Administered 2016-07-04: 01:00:00 via INTRAVENOUS

## 2016-07-03 MED ORDER — MORPHINE SULFATE (PF) 2 MG/ML IV SOLN
2.0000 mg | INTRAVENOUS | Status: DC | PRN
  Administered 2016-07-04 (×2): 2 mg via INTRAVENOUS
  Filled 2016-07-03 (×2): qty 1

## 2016-07-03 MED ORDER — DONEPEZIL HCL 5 MG PO TABS
5.0000 mg | ORAL_TABLET | Freq: Every day | ORAL | Status: DC
Start: 2016-07-03 — End: 2016-07-10
  Administered 2016-07-04 – 2016-07-09 (×7): 5 mg via ORAL
  Filled 2016-07-03 (×7): qty 1

## 2016-07-03 MED ORDER — ROSUVASTATIN CALCIUM 5 MG PO TABS
5.0000 mg | ORAL_TABLET | Freq: Every day | ORAL | Status: DC
  Administered 2016-07-04 – 2016-07-09 (×7): 5 mg via ORAL
  Filled 2016-07-03 (×7): qty 1

## 2016-07-03 MED ORDER — FERROUS SULFATE 325 (65 FE) MG PO TABS
325.0000 mg | ORAL_TABLET | Freq: Two times a day (BID) | ORAL | Status: DC
  Administered 2016-07-04 – 2016-07-10 (×13): 325 mg via ORAL
  Filled 2016-07-03 (×13): qty 1

## 2016-07-03 MED ORDER — MORPHINE SULFATE (PF) 2 MG/ML IV SOLN
2.0000 mg | INTRAVENOUS | Status: DC | PRN
Start: 2016-07-03 — End: 2016-07-07
  Administered 2016-07-03: 2 mg via INTRAVENOUS
  Filled 2016-07-03: qty 1

## 2016-07-03 MED ORDER — SODIUM BICARBONATE 650 MG PO TABS
650.0000 mg | ORAL_TABLET | Freq: Two times a day (BID) | ORAL | Status: DC
  Administered 2016-07-04: 650 mg via ORAL
  Filled 2016-07-03: qty 1

## 2016-07-03 NOTE — H&P (Signed)
History and Physical    [name] TMA:[id number] DOB: 22-Dec-1930 DOA: 07/03/2016  PCP: Jani Gravel, MD  Patient coming from: Home.    Chief Complaint:  [place] and hurt her left hip.   HPI: [name] is an 83 y.o. female with hx of dementia, mild DM, ataxia and fall risks, ESRD on HD (T Th Sat), HLD, HTN, fell at home without loss of consciousness, presented to the ER with left leg pain.  Work up in the ER included an X ray of the femur showing comminuted distal femur Fx with some angulation, Xray of the left foot showed toe and metatarsal Fx, LS spine film showed compressive Fx of undetermined age, no Fx of the left pelvis, and CXR showed cardiomegaly and no infiltrate.  Her UA was negative, and lab work is still pending.  Orthopedics was consulted, and Dr Lorin Mercy planned to perform ORIF of the left femur, and foot did not require surgical intervention.  Hospitalist was asked to admit her for same.    ED Course:  See above.  Rewiew of Systems:  Constitutional: Negative for malaise, fever and chills. No significant weight loss or weight gain Eyes: Negative for eye pain, redness and discharge, diplopia, visual changes, or flashes of light. ENMT: Negative for ear pain, hoarseness, nasal congestion, sinus pressure and sore throat. No headaches; tinnitus, drooling, or problem swallowing. Cardiovascular: Negative for chest pain, palpitations, diaphoresis, dyspnea and peripheral edema. ; No orthopnea, PND Respiratory: Negative for cough, hemoptysis, wheezing and stridor. No pleuritic chestpain. Gastrointestinal: Negative for diarrhea, constipation,  melena, blood in stool, hematemesis, jaundice and rectal bleeding.    Genitourinary: Negative for frequency, dysuria, incontinence,flank pain and hematuria; Musculoskeletal: Negative for back pain and neck pain. Negative for swelling and trauma.;  Skin: . Negative for pruritus, rash, abrasions, bruising and skin lesion.; ulcerations Neuro:  Negative for headache, lightheadedness and neck stiffness. Negative for weakness, altered level of consciousness , altered mental status, extremity weakness, burning feet, involuntary movement, seizure and syncope.  Psych: negative for anxiety, depression, insomnia, tearfulness, panic attacks, hallucinations, paranoia, suicidal or homicidal ideation   Past Medical History:  Diagnosis Date  . Absent pedal pulses   . Anemia of chronic disease 10/05/2010  . Anemia, iron deficiency 08/18/2012  . Arthritis    osteoarthritis  . Ataxia involving legs    has had several falls  . Chronic kidney disease    Acute on Chronic  . Dementia    "mild" per MD note  . Diabetes mellitus   . Family history of anesthesia complication    Nephew- had time waking  . High cholesterol   . Hyperlipidemia   . Hypertension   . Neuropathy ([place])   . Shortness of breath     Past Surgical History:  Procedure Laterality Date  . CORONARY ARTERY BYPASS GRAFT  2008  . EYE SURGERY    . Heart bypass surgery  2008  . INSERTION OF DIALYSIS CATHETER Left 09/05/2013   Procedure: INSERTION OF DIALYSIS CATHETER;  Surgeon: Conrad Loveland, MD;  Location: [place];  Service: Vascular;  Laterality: Left;  . left eye surgery     cataract     reports that she has never smoked. She has never used smokeless tobacco. She reports that she does not drink alcohol or use drugs.  No Known Allergies  Family History  Problem Relation Age of Onset  . Diabetes Father   . Cancer Sister   . Aneurysm Mother  Prior to Admission medications   Medication Sig Start Date End Date Taking? Authorizing Provider  acetaminophen-codeine (TYLENOL #3) 300-30 MG per tablet Take 1 tablet by mouth every 6 (six) hours as needed for moderate pain. DO NOT EXCEED 3 GM OF APAP/24 HOURS FROM ALL SOURCES 08/14/14   Lauree Chandler, NP  calcitRIOL (ROCALTROL) 0.25 MCG capsule Take 1 capsule (0.25 mcg total) by mouth daily. 08/27/13   Kathie Dike, MD    cilostazol (PLETAL) 100 MG tablet Take 100 mg by mouth 2 (two) times daily. For CIRCULATION or AS DIRECTED by your physician    Historical Provider, MD  donepezil (ARICEPT) 10 MG tablet Take 5 mg by mouth at bedtime. For MEMORY    Historical Provider, MD  erythromycin ophthalmic ointment Place 1 application into both eyes at bedtime. For keratitis    Historical Provider, MD  ferrous sulfate 325 (65 FE) MG tablet Take 325 mg by mouth 2 (two) times daily. As SUPPLEMENT    Historical Provider, MD  folic acid-vitamin b complex-vitamin c-selenium-zinc (DIALYVITE) 3 MG TABS tablet Take 1 tablet by mouth daily.    Historical Provider, MD  gabapentin (NEURONTIN) 300 MG capsule Take 300 mg by mouth 2 (two) times daily. For NEUROPATHY/NERVE PAIN    Historical Provider, MD  lidocaine-prilocaine (EMLA) cream Apply 1 application topically as needed (On Tues, Thu,and Sat.).    Historical Provider, MD  linagliptin (TRADJENTA) 5 MG TABS tablet Take 5 mg by mouth every morning. For BLOOD SUGARS/DIABETES    Historical Provider, MD  Propylene Glycol (SYSTANE BALANCE) 0.6 % SOLN Apply 1 drop to eye 2 (two) times daily.    Historical Provider, MD  rosuvastatin (CRESTOR) 5 MG tablet Take 5 mg by mouth at bedtime. For CHOLESTEROL    Historical Provider, MD  sevelamer carbonate (RENVELA) 800 MG tablet Take 1 tablet (800 mg total) by mouth 3 (three) times daily with meals. 08/27/13   Kathie Dike, MD  sodium bicarbonate 650 MG tablet Take 650 mg by mouth 2 (two) times daily.    Historical Provider, MD  torsemide (DEMADEX) 10 MG tablet Take 5 tablets (50 mg total) by mouth daily. 08/27/13   Kathie Dike, MD  Vitamin D, Ergocalciferol, (DRISDOL) 50000 UNITS CAPS capsule Take 1 capsule (50,000 Units total) by mouth every 7 (seven) days. 01/05/13   Rosita Fire, MD    Physical Exam: Vitals:   07/03/16 1751 07/03/16 1800 07/03/16 1852 07/03/16 1900  BP:  (!) 142/87  137/63  Pulse:   (!) 107 (!) 102  Resp: 18     Temp:       TempSrc:      SpO2: 100%  100% 99%  Weight:      Height:        Constitutional: NAD, calm, comfortable Vitals:   07/03/16 1751 07/03/16 1800 07/03/16 1852 07/03/16 1900  BP:  (!) 142/87  137/63  Pulse:   (!) 107 (!) 102  Resp: 18     Temp:      TempSrc:      SpO2: 100%  100% 99%  Weight:      Height:       Eyes: PERRL, lids and conjunctivae normal ENMT: Mucous membranes are moist. Posterior pharynx clear of any exudate or lesions.Normal dentition.  Neck: normal, supple, no masses, no thyromegaly Respiratory: clear to auscultation bilaterally, no wheezing, no crackles. Normal respiratory effort. No accessory muscle use.  Cardiovascular: Regular rate and rhythm, no murmurs / rubs / gallops. No extremity  edema. 2+ pedal pulses. No carotid bruits.  Abdomen: no tenderness, no masses palpated. No hepatosplenomegaly. Bowel sounds positive.  Musculoskeletal: no clubbing / cyanosis. No joint deformity upper and lower extremities. Good ROM, no contractures. Normal muscle tone.  Skin: no rashes, lesions, ulcers. No induration Neurologic: CN 2-12 grossly intact. Sensation intact, DTR normal. Strength 5/5 in all 4.  Psychiatric: Normal judgment and insight. Alert and oriented x 3. Normal mood.     Labs on Admission: I have personally reviewed following labs and imaging studies  Urine analysis:    Component Value Date/Time   COLORURINE Yellow 10/12/2013 1254   COLORURINE YELLOW 08/24/2013 1454   APPEARANCEUR Clear 10/12/2013 1254   LABSPEC 1.004 10/12/2013 1254   PHURINE 6.0 10/12/2013 1254   PHURINE 5.5 08/24/2013 1454   GLUCOSEU Negative 10/12/2013 1254   HGBUR 1+ 10/12/2013 1254   HGBUR NEGATIVE 08/24/2013 1454   BILIRUBINUR Negative 10/12/2013 1254   KETONESUR Negative 10/12/2013 1254   KETONESUR NEGATIVE 08/24/2013 1454   PROTEINUR 100 mg/dL 10/12/2013 1254   PROTEINUR NEGATIVE 08/24/2013 1454   UROBILINOGEN 0.2 08/24/2013 1454   NITRITE Negative 10/12/2013 1254    NITRITE NEGATIVE 08/24/2013 1454   LEUKOCYTESUR Negative 10/12/2013 1254    Radiological Exams on Admission: Dg Lumbar Spine Complete  Result Date: 07/03/2016 CLINICAL DATA:  PT states she was getting out of the car after her dialysis treatment today and tripped over her rolling walker and landed onto her left leg and buttocks. PT c/o pain to buttocks and down left leg since fall. EXAM: LUMBAR SPINE - COMPLETE 4+ VIEW COMPARISON:  04/20/2008 FINDINGS: There is a mild compression fracture of L4 with depression of the upper endplate, new since the prior radiographs. No other fractures. Slight, approximately 3 mm, anterolisthesis of L4 on L5, and mild retrolisthesis of L2 on L3 also new since the prior exam. No other spondylolisthesis. Moderate loss of disc height with endplate sclerosis and osteophytes at L2-L3, increased in severity from the prior exam. Moderate loss disc height at L3-L4. Moderate to severe loss of disc height at L4-L5 and moderate loss disc height at L5-S1. Loss of disc height has increased at L3-L4 and L4-L5. Bones are diffusely demineralized. There are dense vascular calcifications. Calcification in the central pelvis is consistent with calcified fibroid, similar to the prior exam. There has been a prior cholecystectomy. IMPRESSION: 1. Mild depression fracture of L4, of unclear chronicity, but new since the prior lumbar spine radiographs. 2. No other fractures. 3. Degenerative changes as detailed which have progressed since the prior study. Electronically Signed   By: Lajean Manes M.D.   On: 07/03/2016 19:23   Dg Ankle Complete Left  Result Date: 07/03/2016 CLINICAL DATA:  PT states she was getting out of the car after her dialysis treatment today and tripped over her rolling walker and landed onto her left leg and buttocks. PT c/o pain to buttocks and down left leg since fall. EXAM: LEFT ANKLE COMPLETE - 3+ VIEW COMPARISON:  None. FINDINGS: No fracture.  No bone lesion. The ankle  mortise is normally spaced and aligned. There are plantar and dorsal calcaneal spurs. Bones are diffusely demineralized. There is mild nonspecific subcutaneous soft tissue edema and dense vascular calcifications. IMPRESSION: No fracture, dislocation or acute finding. Electronically Signed   By: Lajean Manes M.D.   On: 07/03/2016 19:28   Dg Chest Port 1 View  Result Date: 07/03/2016 CLINICAL DATA:  Femur fracture.  History of hypertension. EXAM: PORTABLE CHEST 1 VIEW  COMPARISON:  Chest radiograph April 17, 2014 FINDINGS: Cardiac silhouette is mildly enlarged, mediastinal silhouette is unremarkable for this low inspiratory examination with crowded vasculature markings. Calcified aortic knob. Similar fullness of the hila compatible with vascular structures. Status post median sternotomy for CABG. The lungs are clear without pleural effusions or focal consolidations. Trachea projects midline and there is no pneumothorax. Included soft tissue planes and osseous structures are non-suspicious. Surgical clips in the included right abdomen compatible with cholecystectomy. IMPRESSION: Mild cardiomegaly without acute pulmonary process in this low inspiratory portable examination. Electronically Signed   By: Elon Alas M.D.   On: 07/03/2016 20:19   Dg Knee Complete 4 Views Left  Result Date: 07/03/2016 CLINICAL DATA:  PT states she was getting out of the car after her dialysis treatment today and tripped over her rolling walker and landed onto her left leg and buttocks. PT c/o pain to buttocks and down left leg since fall. EXAM: LEFT KNEE - COMPLETE 4+ VIEW COMPARISON:  None. FINDINGS: There is a comminuted fracture of the distal femur. There is a transverse fracture across the proximal metaphysis. There are comminuted fracture components along the lateral margin of the fracture. Fracture is mildly impacted by approximately 1.5 cm. The fracture shows posterior and lateral displacement by just over 1 cm. There is  also anterior angulation of the distal fracture component of approximately 20 degrees. There are no other fractures. The bones are diffusely demineralized. The knee joint is normally aligned. There is tricompartmental joint space narrowing with marginal osteophytes consistent with osteoarthritis. No significant joint effusion is noted. There is surrounding soft tissue edema and dense vascular calcifications. IMPRESSION: Comminuted, mildly displaced and impacted and angulated fracture of the distal left femur at the proximal metaphysis. No dislocation. Electronically Signed   By: Lajean Manes M.D.   On: 07/03/2016 19:27   Dg Foot Complete Left  Result Date: 07/03/2016 CLINICAL DATA:  Patient tripped and fell.  Pain. EXAM: LEFT FOOT - COMPLETE 3+ VIEW COMPARISON:  None. FINDINGS: Bones are demineralized. Degenerative changes are seen at the midfoot. Linear lucencies through the proximal aspect of the second, third, and fourth metatarsals is consistent with fracture. Cortical over had with linear lucency in the neck of the second metatarsal noted. Oblique fracture through the base of the great toe distal phalanx appears nonacute. Dense vascular calcifications suggest diabetes. IMPRESSION: 1. Transverse fractures through the proximal second, third, and fourth metatarsals. 2. Question fracture in the second metatarsal neck. 3. Oblique fracture through the base of the great toe distal phalanx appears nonacute. Electronically Signed   By: Misty Stanley M.D.   On: 07/03/2016 19:25   Dg Hip Unilat With Pelvis 2-3 Views Left  Result Date: 07/03/2016 CLINICAL DATA:  PT states she was getting out of the car after her dialysis treatment today and tripped over her rolling walker and landed onto her left leg and buttocks. PT c/o pain to buttocks and down left leg since fall. EXAM: DG HIP (WITH OR WITHOUT PELVIS) 2-3V LEFT COMPARISON:  None. FINDINGS: No fracture or dislocation.  No bone lesion. Mild concentric hip joint  space narrowing, left greater than right. The SI joints and symphysis pubis are normally aligned. Bones are diffusely demineralized. Densely calcified fibroid in the central pelvis. There is dense arterial vascular calcification. IMPRESSION: 1. No fracture, dislocation or acute finding. Electronically Signed   By: Lajean Manes M.D.   On: 07/03/2016 19:29    EKG: Independently reviewed. NSR normal axis, no acute  ST T changes.   Assessment/Plan Principal Problem:   Femur fracture, left (HCC) Active Problems:   Hypertension   High cholesterol   Diabetes mellitus   ESRD (end stage renal disease) (HCC)   Femur fracture (HCC)   PLAN:   Left distal femur Fx, comminuted:  Plan to transfer to [facility], Dr Lorin Mercy is aware.  He planned to ORIF tomorrow afternoon.  OK for clear liquid breakfast, then NPO.  No heparin.  Will give some IV pain meds.    Foot Fx:   Per ortho.  No surgical intervention.  ESRD on HD:  Please consult nephrology for dialysis on Sat.   DM:  Will give sensitive coverage.    HLD:  Continue with statin.  Dementia:  Mild.  Continue with meds.    DVT prophylaxis: ;SCD.  Code Status: full code.  Family Communication: none.  Disposition Plan: likely needs STR.  Consults called: Ortho Dr Lorin Mercy.  Admission status: inpatient.    Helio Lack MD FACP. [facility]  If 7PM-7AM, please contact night-coverage [email or web address] Password [email or web address]  07/03/2016, 9:00 PM

## 2016-07-03 NOTE — ED Triage Notes (Signed)
PT states she was getting out of the car after her dialysis treatment today and tripped over her rolling walker and landed onto her left leg and buttocks. PT c/o pain to buttocks and down left leg since fall.

## 2016-07-03 NOTE — ED Provider Notes (Signed)
[facility] DEPT Provider Note   CSN: [id number] Arrival date & time: 07/03/16  1436     History   Chief Complaint Chief Complaint  Patient presents with  . Fall    HPI [name] is a 83 y.o. female.  The history is provided by the patient and a friend. The history is limited by the condition of the patient (Hx dementia).  Fall     Pt was seen at 1705. Per pt and her friend:  Pt returned home from HD treatment and tripped over her walker approximately 1500 today. Pt states she landed on her left hip and buttocks area. Pt c/o entire left leg "pain." Pt has not been ambulatory since the fall. Denies hitting head, no LOC, no AMS, no CP/SOB, no abd pain, no focal motor weakness.   Past Medical History:  Diagnosis Date  . Absent pedal pulses   . Anemia of chronic disease 10/05/2010  . Anemia, iron deficiency 08/18/2012  . Arthritis    osteoarthritis  . Ataxia involving legs    has had several falls  . Chronic kidney disease    Acute on Chronic  . Dementia    "mild" per MD note  . Diabetes mellitus   . Family history of anesthesia complication    Nephew- had time waking  . High cholesterol   . Hyperlipidemia   . Hypertension   . Neuropathy ([place])   . Shortness of breath     Patient Active Problem List   Diagnosis Date Noted  . Toe pain 07/26/2014  . Diastolic CHF ([place]) 96/22/2979  . Diabetes type 2, controlled ([place]) 02/01/2014  . Hand pain, left 02/01/2014  . Secondary renovascular hypertension, benign 11/10/2013  . Pain in mouth 09/06/2013  . Chest congestion 09/06/2013  . Abnormality of gait 08/31/2013  . Thrombocytopenia, unspecified 08/25/2013  . Renal failure 08/24/2013  . Dementia 08/24/2013  . Frequent falls 08/24/2013  . Acute on chronic kidney disease, stage 4 08/24/2013  . Uremia 08/24/2013  . Hyperkalemia 08/24/2013  . Acute on chronic renal failure ([place]) 01/01/2013  . Anemia in chronic renal disease 01/01/2013  . Osteoarthritis 01/01/2013    . Right knee pain 01/01/2013  . Diabetes mellitus, type 2 ([place]) 01/01/2013  . HTN (hypertension) 01/01/2013  . Other and unspecified hyperlipidemia 01/01/2013  . Hx of CABG 01/01/2013  . Anemia, iron deficiency 08/18/2012  . Anemia of chronic disease 10/05/2010  . ESRD on dialysis [facility]) 02/18/2010    Past Surgical History:  Procedure Laterality Date  . CORONARY ARTERY BYPASS GRAFT  2008  . EYE SURGERY    . Heart bypass surgery  2008  . INSERTION OF DIALYSIS CATHETER Left 09/05/2013   Procedure: INSERTION OF DIALYSIS CATHETER;  Surgeon: Conrad [place], MD;  Location: [facility];  Service: Vascular;  Laterality: Left;  . left eye surgery     cataract    OB History    No data available       Home Medications    Prior to Admission medications   Medication Sig Start Date End Date Taking? Authorizing Provider  acetaminophen-codeine (TYLENOL #3) 300-30 MG per tablet Take 1 tablet by mouth every 6 (six) hours as needed for moderate pain. DO NOT EXCEED 3 GM OF APAP/24 HOURS FROM ALL SOURCES 08/14/14   Lauree Chandler, NP  calcitRIOL (ROCALTROL) 0.25 MCG capsule Take 1 capsule (0.25 mcg total) by mouth daily. 08/27/13   Kathie Dike, MD  cilostazol (PLETAL) 100 MG tablet Take  100 mg by mouth 2 (two) times daily. For CIRCULATION or AS DIRECTED by your physician    Historical Provider, MD  donepezil (ARICEPT) 10 MG tablet Take 5 mg by mouth at bedtime. For MEMORY    Historical Provider, MD  erythromycin ophthalmic ointment Place 1 application into both eyes at bedtime. For keratitis    Historical Provider, MD  ferrous sulfate 325 (65 FE) MG tablet Take 325 mg by mouth 2 (two) times daily. As SUPPLEMENT    Historical Provider, MD  folic acid-vitamin b complex-vitamin c-selenium-zinc (DIALYVITE) 3 MG TABS tablet Take 1 tablet by mouth daily.    Historical Provider, MD  gabapentin (NEURONTIN) 300 MG capsule Take 300 mg by mouth 2 (two) times daily. For NEUROPATHY/NERVE PAIN    Historical Provider,  MD  lidocaine-prilocaine (EMLA) cream Apply 1 application topically as needed (On Tues, Thu,and Sat.).    Historical Provider, MD  linagliptin (TRADJENTA) 5 MG TABS tablet Take 5 mg by mouth every morning. For BLOOD SUGARS/DIABETES    Historical Provider, MD  Propylene Glycol (SYSTANE BALANCE) 0.6 % SOLN Apply 1 drop to eye 2 (two) times daily.    Historical Provider, MD  rosuvastatin (CRESTOR) 5 MG tablet Take 5 mg by mouth at bedtime. For CHOLESTEROL    Historical Provider, MD  sevelamer carbonate (RENVELA) 800 MG tablet Take 1 tablet (800 mg total) by mouth 3 (three) times daily with meals. 08/27/13   Kathie Dike, MD  sodium bicarbonate 650 MG tablet Take 650 mg by mouth 2 (two) times daily.    Historical Provider, MD  torsemide (DEMADEX) 10 MG tablet Take 5 tablets (50 mg total) by mouth daily. 08/27/13   Kathie Dike, MD  Vitamin D, Ergocalciferol, (DRISDOL) 50000 UNITS CAPS capsule Take 1 capsule (50,000 Units total) by mouth every 7 (seven) days. 01/05/13   Rosita Fire, MD    Family History Family History  Problem Relation Age of Onset  . Diabetes Father   . Cancer Sister   . Aneurysm Mother     Social History Social History  Substance Use Topics  . Smoking status: Never Smoker  . Smokeless tobacco: Never Used  . Alcohol use No     Allergies   Patient has no known allergies.   Review of Systems Review of Systems  Unable to perform ROS: Dementia     Physical Exam Updated Vital Signs BP 107/76 (BP Location: Right Arm)   Pulse 98   Temp 98.1 F (36.7 C) (Oral)   Resp 18   Ht 5\' 6"  (1.676 m)   Wt 165 lb (74.8 kg)   SpO2 100%   BMI 26.63 kg/m   Physical Exam 1710: Physical examination:  Nursing notes reviewed; Vital signs and O2 SAT reviewed;  Constitutional: Well developed, Well nourished, Well hydrated, In no acute distress; Head:  Normocephalic, atraumatic; Eyes: EOMI, PERRL, No scleral icterus; ENMT: Mouth and pharynx normal, Mucous membranes moist;  Neck: Supple, Full range of motion, No lymphadenopathy; Cardiovascular: Regular rate and rhythm, No gallop; Respiratory: Breath sounds clear & equal bilaterally, No wheezes.  Speaking full sentences with ease, Normal respiratory effort/excursion; Chest: Nontender, Movement normal; Abdomen: Soft, Nontender, Nondistended, Normal bowel sounds; Genitourinary: No CVA tenderness; Spine:  No midline CS, TS, LS tenderness. +mild TTP left lumbar paraspinal muscles.;; Extremities: Bilat pedal pulses not palp her hx. Otherwise pulses normal, Pelvis stable. +mild left hip tenderness to palp, no deformity. +decreased ROM left knee due to pain. +plantarflexion of left foot w/calf squeeze.  No palpable gap left Achilles's tendon.  No proximal fibular head tenderness.  No edema, erythema, warmth, ecchymosis or deformity.  +generalized left knee TTP without specific area of point tenderness. +left dorsal foot TTP, no open wounds. No calf tenderness, edema or asymmetry.; Neuro: Awake, alert, mildly confused per hx. Major CN grossly intact.  Speech clear.  With exception above, no gross focal motor deficits in extremities.; Skin: Color normal, Warm, Dry.   ED Treatments / Results  Labs (all labs ordered are listed, but only abnormal results are displayed)   EKG  EKG Interpretation  Date/Time:  Thursday July 03 2016 20:02:36 EDT Ventricular Rate:  103 PR Interval:    QRS Duration: 81 QT Interval:  343 QTC Calculation: 452 R Axis:   24 Text Interpretation:  Sinus tachycardia Supraventricular bigeminy Anterior infarct, old Baseline wander When compared with ECG of 09/05/2013 Rate faster Confirmed by [facility]  MD, Nunzio Cory [phone]) on 07/03/2016 8:38:11 PM       Radiology   Procedures Procedures (including critical care time)  Medications Ordered in ED Medications  HYDROcodone-acetaminophen (NORCO/VICODIN) 5-325 MG per tablet 1 tablet (1 tablet Oral Given 07/03/16 1719)     Initial Impression / Assessment and  Plan / ED Course  I have reviewed the triage vital signs and the nursing notes.  Pertinent labs & imaging results that were available during my care of the patient were reviewed by me and considered in my medical decision making (see chart for details).  MDM Reviewed: previous chart, nursing note and vitals Reviewed previous: labs and ECG Interpretation: x-ray, labs and ECG   Results for orders placed or performed during the hospital encounter of 76/72/09  Basic metabolic panel  Result Value Ref Range   Sodium 135 135 - 145 mmol/L   Potassium 3.7 3.5 - 5.1 mmol/L   Chloride 91 (L) 101 - 111 mmol/L   CO2 34 (H) 22 - 32 mmol/L   Glucose, Bld 201 (H) 65 - 99 mg/dL   BUN 16 6 - 20 mg/dL   Creatinine, Ser 3.90 (H) 0.44 - 1.00 mg/dL   Calcium 8.8 (L) 8.9 - 10.3 mg/dL   GFR calc non Af Amer 10 (L) >60 mL/min   GFR calc Af Amer 11 (L) >60 mL/min   Anion gap 10 5 - 15  Troponin I  Result Value Ref Range   Troponin I <0.03 <0.03 ng/mL  CBC with Differential  Result Value Ref Range   WBC 11.0 (H) 4.0 - 10.5 K/uL   RBC 3.59 (L) 3.87 - 5.11 MIL/uL   Hemoglobin 10.5 (L) 12.0 - 15.0 g/dL   HCT 32.4 (L) 36.0 - 46.0 %   MCV 90.3 78.0 - 100.0 fL   MCH 29.2 26.0 - 34.0 pg   MCHC 32.4 30.0 - 36.0 g/dL   RDW 16.5 (H) 11.5 - 15.5 %   Platelets 185 150 - 400 K/uL   Neutrophils Relative % 82 %   Neutro Abs 9.0 (H) 1.7 - 7.7 K/uL   Lymphocytes Relative 10 %   Lymphs Abs 1.1 0.7 - 4.0 K/uL   Monocytes Relative 8 %   Monocytes Absolute 0.9 0.1 - 1.0 K/uL   Eosinophils Relative 0 %   Eosinophils Absolute 0.0 0.0 - 0.7 K/uL   Basophils Relative 0 %   Basophils Absolute 0.0 0.0 - 0.1 K/uL     Dg Lumbar Spine Complete Result Date: 07/03/2016 CLINICAL DATA:  PT states she was getting out of the car after her  dialysis treatment today and tripped over her rolling walker and landed onto her left leg and buttocks. PT c/o pain to buttocks and down left leg since fall. EXAM: LUMBAR SPINE - COMPLETE 4+  VIEW COMPARISON:  04/20/2008 FINDINGS: There is a mild compression fracture of L4 with depression of the upper endplate, new since the prior radiographs. No other fractures. Slight, approximately 3 mm, anterolisthesis of L4 on L5, and mild retrolisthesis of L2 on L3 also new since the prior exam. No other spondylolisthesis. Moderate loss of disc height with endplate sclerosis and osteophytes at L2-L3, increased in severity from the prior exam. Moderate loss disc height at L3-L4. Moderate to severe loss of disc height at L4-L5 and moderate loss disc height at L5-S1. Loss of disc height has increased at L3-L4 and L4-L5. Bones are diffusely demineralized. There are dense vascular calcifications. Calcification in the central pelvis is consistent with calcified fibroid, similar to the prior exam. There has been a prior cholecystectomy. IMPRESSION: 1. Mild depression fracture of L4, of unclear chronicity, but new since the prior lumbar spine radiographs. 2. No other fractures. 3. Degenerative changes as detailed which have progressed since the prior study. Electronically Signed   By: Lajean Manes M.D.   On: 07/03/2016 19:23   Dg Ankle Complete Left Result Date: 07/03/2016 CLINICAL DATA:  PT states she was getting out of the car after her dialysis treatment today and tripped over her rolling walker and landed onto her left leg and buttocks. PT c/o pain to buttocks and down left leg since fall. EXAM: LEFT ANKLE COMPLETE - 3+ VIEW COMPARISON:  None. FINDINGS: No fracture.  No bone lesion. The ankle mortise is normally spaced and aligned. There are plantar and dorsal calcaneal spurs. Bones are diffusely demineralized. There is mild nonspecific subcutaneous soft tissue edema and dense vascular calcifications. IMPRESSION: No fracture, dislocation or acute finding. Electronically Signed   By: Lajean Manes M.D.   On: 07/03/2016 19:28   Dg Knee Complete 4 Views Left Result Date: 07/03/2016 CLINICAL DATA:  PT states she was  getting out of the car after her dialysis treatment today and tripped over her rolling walker and landed onto her left leg and buttocks. PT c/o pain to buttocks and down left leg since fall. EXAM: LEFT KNEE - COMPLETE 4+ VIEW COMPARISON:  None. FINDINGS: There is a comminuted fracture of the distal femur. There is a transverse fracture across the proximal metaphysis. There are comminuted fracture components along the lateral margin of the fracture. Fracture is mildly impacted by approximately 1.5 cm. The fracture shows posterior and lateral displacement by just over 1 cm. There is also anterior angulation of the distal fracture component of approximately 20 degrees. There are no other fractures. The bones are diffusely demineralized. The knee joint is normally aligned. There is tricompartmental joint space narrowing with marginal osteophytes consistent with osteoarthritis. No significant joint effusion is noted. There is surrounding soft tissue edema and dense vascular calcifications. IMPRESSION: Comminuted, mildly displaced and impacted and angulated fracture of the distal left femur at the proximal metaphysis. No dislocation. Electronically Signed   By: Lajean Manes M.D.   On: 07/03/2016 19:27   Dg Foot Complete Left Result Date: 07/03/2016 CLINICAL DATA:  Patient tripped and fell.  Pain. EXAM: LEFT FOOT - COMPLETE 3+ VIEW COMPARISON:  None. FINDINGS: Bones are demineralized. Degenerative changes are seen at the midfoot. Linear lucencies through the proximal aspect of the second, third, and fourth metatarsals is consistent with fracture. Cortical  over had with linear lucency in the neck of the second metatarsal noted. Oblique fracture through the base of the great toe distal phalanx appears nonacute. Dense vascular calcifications suggest diabetes. IMPRESSION: 1. Transverse fractures through the proximal second, third, and fourth metatarsals. 2. Question fracture in the second metatarsal neck. 3. Oblique  fracture through the base of the great toe distal phalanx appears nonacute. Electronically Signed   By: Misty Stanley M.D.   On: 07/03/2016 19:25   Dg Hip Unilat With Pelvis 2-3 Views Left Result Date: 07/03/2016 CLINICAL DATA:  PT states she was getting out of the car after her dialysis treatment today and tripped over her rolling walker and landed onto her left leg and buttocks. PT c/o pain to buttocks and down left leg since fall. EXAM: DG HIP (WITH OR WITHOUT PELVIS) 2-3V LEFT COMPARISON:  None. FINDINGS: No fracture or dislocation.  No bone lesion. Mild concentric hip joint space narrowing, left greater than right. The SI joints and symphysis pubis are normally aligned. Bones are diffusely demineralized. Densely calcified fibroid in the central pelvis. There is dense arterial vascular calcification. IMPRESSION: 1. No fracture, dislocation or acute finding. Electronically Signed   By: Lajean Manes M.D.   On: 07/03/2016 19:29    Dg Chest Port 1 View Result Date: 07/03/2016 CLINICAL DATA:  Femur fracture.  History of hypertension. EXAM: PORTABLE CHEST 1 VIEW COMPARISON:  Chest radiograph April 17, 2014 FINDINGS: Cardiac silhouette is mildly enlarged, mediastinal silhouette is unremarkable for this low inspiratory examination with crowded vasculature markings. Calcified aortic knob. Similar fullness of the hila compatible with vascular structures. Status post median sternotomy for CABG. The lungs are clear without pleural effusions or focal consolidations. Trachea projects midline and there is no pneumothorax. Included soft tissue planes and osseous structures are non-suspicious. Surgical clips in the included right abdomen compatible with cholecystectomy. IMPRESSION: Mild cardiomegaly without acute pulmonary process in this low inspiratory portable examination. Electronically Signed   By: Elon Alas M.D.   On: 07/03/2016 20:19     1950:  Dx and testing d/w pt and family.  Questions answered.   Verb understanding, agreeable to admit/transfer to [facility]. T/C to [facility] Dr. Lorin Mercy, case discussed, including:  HPI, pertinent PM/SHx, VS/PE, dx testing, ED course and treatment:  He has viewed the images, requests to place long leg knee immobilizer, Jones dressing left lower leg, transfer to [facility] under [facility], he will place under his name for tx team, states pt will be add on case tomorrow evening, OK to have clears tomorrow morning. Family states pt due for HD on Saturday.   2015:    T/C to [facility] Dr. Marin Comment, case discussed, including:  HPI, pertinent PM/SHx, VS/PE, dx testing, ED course and treatment:  Agreeable to facilitate transfer/admit to [facility].     Final Clinical Impressions(s) / ED Diagnoses   Final diagnoses:  None    New Prescriptions New Prescriptions   No medications on file      Francine Graven, DO 07/05/16 2359

## 2016-07-03 NOTE — ED Triage Notes (Signed)
PT is part of the [facility] program in the [facility] and if any medical question fell free to call her nursing staff at [phone].

## 2016-07-03 NOTE — Progress Notes (Signed)
Patient ID: [name], female   DOB: 1930-05-17, 83 y.o.   MRN: [id number] Called from [facility] by Dr. Thurnell Garbe about pt's injuries. X-rays reviewed. Have posted her for surgery [facility] OR at 5:30 PM Friday for retrograde femoral nail to stabilize supracondylar femur fracture, left.    Left foot fractures should not require surgery. She may may have clear liquid breakfast at [facility] then NPO. I will see her in AM to discuss surgery and obtain informed consent for her surgery Friday 5:30 PM.   No heparin or lovenox Friday .  My cell [phone].

## 2016-07-04 ENCOUNTER — Encounter (HOSPITAL_COMMUNITY): Payer: Self-pay | Admitting: Anesthesiology

## 2016-07-04 ENCOUNTER — Encounter (HOSPITAL_COMMUNITY): Payer: Self-pay | Admitting: Certified Registered Nurse Anesthetist

## 2016-07-04 ENCOUNTER — Encounter (HOSPITAL_COMMUNITY)
Admission: EM | Disposition: A | Discharge status: Skilled Nursing Facility | Payer: Self-pay | Source: Home / Self Care | Attending: Internal Medicine

## 2016-07-04 DIAGNOSIS — Z992 Dependence on renal dialysis: Secondary | ICD-10-CM

## 2016-07-04 DIAGNOSIS — S92335A Nondisplaced fracture of third metatarsal bone, left foot, initial encounter for closed fracture: Secondary | ICD-10-CM

## 2016-07-04 DIAGNOSIS — S92345A Nondisplaced fracture of fourth metatarsal bone, left foot, initial encounter for closed fracture: Secondary | ICD-10-CM

## 2016-07-04 DIAGNOSIS — E7800 Pure hypercholesterolemia, unspecified: Secondary | ICD-10-CM

## 2016-07-04 DIAGNOSIS — N186 End stage renal disease: Secondary | ICD-10-CM

## 2016-07-04 DIAGNOSIS — I10 Essential (primary) hypertension: Secondary | ICD-10-CM

## 2016-07-04 DIAGNOSIS — S92422A Displaced fracture of distal phalanx of left great toe, initial encounter for closed fracture: Secondary | ICD-10-CM

## 2016-07-04 DIAGNOSIS — S92325A Nondisplaced fracture of second metatarsal bone, left foot, initial encounter for closed fracture: Secondary | ICD-10-CM

## 2016-07-04 LAB — RENAL FUNCTION PANEL
ANION GAP: 15 (ref 5–15)
Albumin: 3.1 g/dL — ABNORMAL LOW (ref 3.5–5.0)
BUN: 21 mg/dL — ABNORMAL HIGH (ref 6–20)
CALCIUM: 8.8 mg/dL — AB (ref 8.9–10.3)
CHLORIDE: 92 mmol/L — AB (ref 101–111)
CO2: 28 mmol/L (ref 22–32)
Creatinine, Ser: 4.88 mg/dL — ABNORMAL HIGH (ref 0.44–1.00)
GFR calc non Af Amer: 7 mL/min — ABNORMAL LOW (ref >60)
GFR, EST AFRICAN AMERICAN: 9 mL/min — AB (ref >60)
Glucose, Bld: 213 mg/dL — ABNORMAL HIGH (ref 65–99)
POTASSIUM: 3.9 mmol/L (ref 3.5–5.1)
Phosphorus: 4 mg/dL (ref 2.5–4.6)
SODIUM: 135 mmol/L (ref 135–145)

## 2016-07-04 LAB — GLUCOSE, CAPILLARY
GLUCOSE-CAPILLARY: 165 mg/dL — AB (ref 65–99)
GLUCOSE-CAPILLARY: 160 mg/dL — AB (ref 65–99)
GLUCOSE-CAPILLARY: 152 mg/dL — AB (ref 65–99)
Glucose-Capillary: 159 mg/dL — ABNORMAL HIGH (ref 65–99)
Glucose-Capillary: 159 mg/dL — ABNORMAL HIGH (ref 65–99)
Glucose-Capillary: 169 mg/dL — ABNORMAL HIGH (ref 65–99)
Glucose-Capillary: 192 mg/dL — ABNORMAL HIGH (ref 65–99)

## 2016-07-04 LAB — CBC WITH DIFFERENTIAL/PLATELET
Basophils Absolute: 0 10*3/uL (ref 0.0–0.1)
Basophils Relative: 0 %
Eosinophils Absolute: 0 10*3/uL (ref 0.0–0.7)
Eosinophils Relative: 0 %
HEMATOCRIT: 31.2 % — AB (ref 36.0–46.0)
HEMOGLOBIN: 9.9 g/dL — AB (ref 12.0–15.0)
LYMPHS ABS: 1 10*3/uL (ref 0.7–4.0)
LYMPHS PCT: 9 %
MCH: 29.1 pg (ref 26.0–34.0)
MCHC: 31.7 g/dL (ref 30.0–36.0)
MCV: 91.8 fL (ref 78.0–100.0)
Monocytes Absolute: 1.4 10*3/uL — ABNORMAL HIGH (ref 0.1–1.0)
Monocytes Relative: 12 %
NEUTROS PCT: 79 %
Neutro Abs: 9.1 10*3/uL — ABNORMAL HIGH (ref 1.7–7.7)
Platelets: 183 10*3/uL (ref 150–400)
RBC: 3.4 MIL/uL — AB (ref 3.87–5.11)
RDW: 16.9 % — ABNORMAL HIGH (ref 11.5–15.5)
WBC: 11.6 10*3/uL — AB (ref 4.0–10.5)

## 2016-07-04 LAB — SURGICAL PCR SCREEN
MRSA, PCR: NEGATIVE
Staphylococcus aureus: NEGATIVE

## 2016-07-04 SURGERY — INSERTION, INTRAMEDULLARY ROD, FEMUR, RETROGRADE
Anesthesia: Choice | Laterality: Left

## 2016-07-04 MED ORDER — FENTANYL CITRATE (PF) 250 MCG/5ML IJ SOLN
INTRAMUSCULAR | Status: AC
  Filled 2016-07-04: qty 5

## 2016-07-04 MED ORDER — GABAPENTIN 300 MG PO CAPS
300.0000 mg | ORAL_CAPSULE | Freq: Every day | ORAL | Status: DC
  Administered 2016-07-06 – 2016-07-10 (×5): 300 mg via ORAL
  Filled 2016-07-04 (×5): qty 1

## 2016-07-04 MED ORDER — CHLORHEXIDINE GLUCONATE 4 % EX LIQD: 60.0000 mL | Freq: Once | CUTANEOUS | Status: AC

## 2016-07-04 MED ORDER — CHLORHEXIDINE GLUCONATE 4 % EX LIQD: 60.0000 mL | Freq: Once | CUTANEOUS | Status: DC

## 2016-07-04 MED ORDER — NEPRO/CARBSTEADY PO LIQD
237.0000 mL | Freq: Two times a day (BID) | ORAL | Status: DC
  Administered 2016-07-06 – 2016-07-10 (×6): 237 mL via ORAL
  Filled 2016-07-04 (×14): qty 237

## 2016-07-04 MED ORDER — CEFAZOLIN SODIUM-DEXTROSE 2-4 GM/100ML-% IV SOLN: 2.0000 g | INTRAVENOUS | Status: DC

## 2016-07-04 MED ORDER — SODIUM CHLORIDE 0.9 % IV SOLN
INTRAVENOUS | Status: DC
  Administered 2016-07-04 – 2016-07-05 (×2): via INTRAVENOUS

## 2016-07-04 MED ORDER — POVIDONE-IODINE 10 % EX SWAB: 2.0000 "application " | Freq: Once | CUTANEOUS | Status: DC

## 2016-07-04 MED ORDER — CALCITRIOL 0.5 MCG PO CAPS
0.5000 ug | ORAL_CAPSULE | ORAL | Status: DC
  Administered 2016-07-06 – 2016-07-10 (×3): 0.5 ug via ORAL
  Filled 2016-07-04: qty 1

## 2016-07-04 MED ORDER — CEFAZOLIN SODIUM-DEXTROSE 2-4 GM/100ML-% IV SOLN
2.0000 g | INTRAVENOUS | Status: DC
  Filled 2016-07-04: qty 100

## 2016-07-04 MED ORDER — RENA-VITE PO TABS
1.0000 | ORAL_TABLET | Freq: Every day | ORAL | Status: DC
  Administered 2016-07-04 – 2016-07-09 (×6): 1 via ORAL
  Filled 2016-07-04 (×6): qty 1

## 2016-07-04 NOTE — H&P (View-Only) (Signed)
Reason for Consult: Displaced angulated impacted left supracondylar femur fracture, closed Referring Physician: Orvan Falconer MD  [name] is an 83 y.o. female.  HPI: A 24-year-old female dialysis patient with mild dementia and history of multiple falls presented with a fall and inability to ambulate when she fell yesterday leg went underneath her. She suffered multiple fractures of metatarsals 2 through 4 on the left and also the supracondylar femur fracture on the left. Patient has severe calcified PAD. She has no palpable pulses. Diabetes hypertension neuropathy shortness of breath present. Daughters at bedside as well as through her friends to give additional history. Last dialysis catheter placement 2015. Her pain she rates as severe present with motion. She is comfortable supine position with knee immobilizer. She could not walk after she fell. Patient lives alone.  Review of systems: Positive for anemia of chronic disease, chronic dialysis. Mild dementia. Diabetes, acute left knee pain and left foot pain.. Peripheral neuropathy numbness of her feet. Negative for abdominal pain nausea vomiting. No hearing problems.  Past Medical History:  Diagnosis Date  . Absent pedal pulses   . Anemia of chronic disease 10/05/2010  . Anemia, iron deficiency 08/18/2012  . Arthritis    osteoarthritis  . Ataxia involving legs    has had several falls  . Chronic kidney disease    Acute on Chronic  . Dementia    "mild" per MD note  . Diabetes mellitus   . Family history of anesthesia complication    Nephew- had time waking  . High cholesterol   . Hyperlipidemia   . Hypertension   . Neuropathy ([place])   . Shortness of breath     Past Surgical History:  Procedure Laterality Date  . CORONARY ARTERY BYPASS GRAFT  2008  . EYE SURGERY    . Heart bypass surgery  2008  . INSERTION OF DIALYSIS CATHETER Left 09/05/2013   Procedure: INSERTION OF DIALYSIS CATHETER;  Surgeon: Conrad [place], MD;  Location:  [facility];  Service: Vascular;  Laterality: Left;  . left eye surgery     cataract    Family History  Problem Relation Age of Onset  . Diabetes Father   . Cancer Sister   . Aneurysm Mother     Social History:  reports that she has never smoked. She has never used smokeless tobacco. She reports that she does not drink alcohol or use drugs.  Allergies: No Known Allergies  Medications: I have reviewed the patient's current medications.  Results for orders placed or performed during the hospital encounter of 07/03/16 (from the past 48 hour(s))  Basic metabolic panel     Status: Abnormal   Collection Time: 07/03/16  8:39 PM  Result Value Ref Range   Sodium 135 135 - 145 mmol/L   Potassium 3.7 3.5 - 5.1 mmol/L   Chloride 91 (L) 101 - 111 mmol/L   CO2 34 (H) 22 - 32 mmol/L   Glucose, Bld 201 (H) 65 - 99 mg/dL   BUN 16 6 - 20 mg/dL   Creatinine, Ser 3.90 (H) 0.44 - 1.00 mg/dL   Calcium 8.8 (L) 8.9 - 10.3 mg/dL   GFR calc non Af Amer 10 (L) >60 mL/min   GFR calc Af Amer 11 (L) >60 mL/min    Comment: (NOTE) The eGFR has been calculated using the CKD EPI equation. This calculation has not been validated in all clinical situations. eGFR's persistently <60 mL/min signify possible Chronic Kidney Disease.    Anion  gap 10 5 - 15  Troponin I     Status: None   Collection Time: 07/03/16  8:39 PM  Result Value Ref Range   Troponin I <0.03 <0.03 ng/mL  CBC with Differential     Status: Abnormal   Collection Time: 07/03/16  8:39 PM  Result Value Ref Range   WBC 11.0 (H) 4.0 - 10.5 K/uL   RBC 3.59 (L) 3.87 - 5.11 MIL/uL   Hemoglobin 10.5 (L) 12.0 - 15.0 g/dL   HCT 32.4 (L) 36.0 - 46.0 %   MCV 90.3 78.0 - 100.0 fL   MCH 29.2 26.0 - 34.0 pg   MCHC 32.4 30.0 - 36.0 g/dL   RDW 16.5 (H) 11.5 - 15.5 %   Platelets 185 150 - 400 K/uL   Neutrophils Relative % 82 %   Neutro Abs 9.0 (H) 1.7 - 7.7 K/uL   Lymphocytes Relative 10 %   Lymphs Abs 1.1 0.7 - 4.0 K/uL   Monocytes Relative 8 %    Monocytes Absolute 0.9 0.1 - 1.0 K/uL   Eosinophils Relative 0 %   Eosinophils Absolute 0.0 0.0 - 0.7 K/uL   Basophils Relative 0 %   Basophils Absolute 0.0 0.0 - 0.1 K/uL  Glucose, capillary     Status: Abnormal   Collection Time: 07/04/16 12:15 AM  Result Value Ref Range   Glucose-Capillary 169 (H) 65 - 99 mg/dL  Surgical PCR screen     Status: None   Collection Time: 07/04/16  3:18 AM  Result Value Ref Range   MRSA, PCR NEGATIVE NEGATIVE   Staphylococcus aureus NEGATIVE NEGATIVE    Comment:        The Xpert SA Assay (FDA approved for NASAL specimens in patients over 14 years of age), is one component of a comprehensive surveillance program.  Test performance has been validated by [facility] for patients greater than or equal to 40 year old. It is not intended to diagnose infection nor to guide or monitor treatment.   Glucose, capillary     Status: Abnormal   Collection Time: 07/04/16  3:59 AM  Result Value Ref Range   Glucose-Capillary 159 (H) 65 - 99 mg/dL  Glucose, capillary     Status: Abnormal   Collection Time: 07/04/16  8:12 AM  Result Value Ref Range   Glucose-Capillary 159 (H) 65 - 99 mg/dL  CBC with Differential/Platelet     Status: Abnormal   Collection Time: 07/04/16  9:44 AM  Result Value Ref Range   WBC 11.6 (H) 4.0 - 10.5 K/uL   RBC 3.40 (L) 3.87 - 5.11 MIL/uL   Hemoglobin 9.9 (L) 12.0 - 15.0 g/dL   HCT 31.2 (L) 36.0 - 46.0 %   MCV 91.8 78.0 - 100.0 fL   MCH 29.1 26.0 - 34.0 pg   MCHC 31.7 30.0 - 36.0 g/dL   RDW 16.9 (H) 11.5 - 15.5 %   Platelets 183 150 - 400 K/uL   Neutrophils Relative % 79 %   Neutro Abs 9.1 (H) 1.7 - 7.7 K/uL   Lymphocytes Relative 9 %   Lymphs Abs 1.0 0.7 - 4.0 K/uL   Monocytes Relative 12 %   Monocytes Absolute 1.4 (H) 0.1 - 1.0 K/uL   Eosinophils Relative 0 %   Eosinophils Absolute 0.0 0.0 - 0.7 K/uL   Basophils Relative 0 %   Basophils Absolute 0.0 0.0 - 0.1 K/uL  Renal function panel     Status: Abnormal    Collection Time:  07/04/16  9:44 AM  Result Value Ref Range   Sodium 135 135 - 145 mmol/L   Potassium 3.9 3.5 - 5.1 mmol/L   Chloride 92 (L) 101 - 111 mmol/L   CO2 28 22 - 32 mmol/L   Glucose, Bld 213 (H) 65 - 99 mg/dL   BUN 21 (H) 6 - 20 mg/dL   Creatinine, Ser 4.88 (H) 0.44 - 1.00 mg/dL   Calcium 8.8 (L) 8.9 - 10.3 mg/dL   Phosphorus 4.0 2.5 - 4.6 mg/dL   Albumin 3.1 (L) 3.5 - 5.0 g/dL   GFR calc non Af Amer 7 (L) >60 mL/min   GFR calc Af Amer 9 (L) >60 mL/min    Comment: (NOTE) The eGFR has been calculated using the CKD EPI equation. This calculation has not been validated in all clinical situations. eGFR's persistently <60 mL/min signify possible Chronic Kidney Disease.    Anion gap 15 5 - 15    Dg Lumbar Spine Complete  Result Date: 07/03/2016 CLINICAL DATA:  PT states she was getting out of the car after her dialysis treatment today and tripped over her rolling walker and landed onto her left leg and buttocks. PT c/o pain to buttocks and down left leg since fall. EXAM: LUMBAR SPINE - COMPLETE 4+ VIEW COMPARISON:  04/20/2008 FINDINGS: There is a mild compression fracture of L4 with depression of the upper endplate, new since the prior radiographs. No other fractures. Slight, approximately 3 mm, anterolisthesis of L4 on L5, and mild retrolisthesis of L2 on L3 also new since the prior exam. No other spondylolisthesis. Moderate loss of disc height with endplate sclerosis and osteophytes at L2-L3, increased in severity from the prior exam. Moderate loss disc height at L3-L4. Moderate to severe loss of disc height at L4-L5 and moderate loss disc height at L5-S1. Loss of disc height has increased at L3-L4 and L4-L5. Bones are diffusely demineralized. There are dense vascular calcifications. Calcification in the central pelvis is consistent with calcified fibroid, similar to the prior exam. There has been a prior cholecystectomy. IMPRESSION: 1. Mild depression fracture of L4, of unclear  chronicity, but new since the prior lumbar spine radiographs. 2. No other fractures. 3. Degenerative changes as detailed which have progressed since the prior study. Electronically Signed   By: Lajean Manes M.D.   On: 07/03/2016 19:23   Dg Ankle Complete Left  Result Date: 07/03/2016 CLINICAL DATA:  PT states she was getting out of the car after her dialysis treatment today and tripped over her rolling walker and landed onto her left leg and buttocks. PT c/o pain to buttocks and down left leg since fall. EXAM: LEFT ANKLE COMPLETE - 3+ VIEW COMPARISON:  None. FINDINGS: No fracture.  No bone lesion. The ankle mortise is normally spaced and aligned. There are plantar and dorsal calcaneal spurs. Bones are diffusely demineralized. There is mild nonspecific subcutaneous soft tissue edema and dense vascular calcifications. IMPRESSION: No fracture, dislocation or acute finding. Electronically Signed   By: Lajean Manes M.D.   On: 07/03/2016 19:28   Dg Chest Port 1 View  Result Date: 07/03/2016 CLINICAL DATA:  Femur fracture.  History of hypertension. EXAM: PORTABLE CHEST 1 VIEW COMPARISON:  Chest radiograph April 17, 2014 FINDINGS: Cardiac silhouette is mildly enlarged, mediastinal silhouette is unremarkable for this low inspiratory examination with crowded vasculature markings. Calcified aortic knob. Similar fullness of the hila compatible with vascular structures. Status post median sternotomy for CABG. The lungs are clear without pleural effusions or  focal consolidations. Trachea projects midline and there is no pneumothorax. Included soft tissue planes and osseous structures are non-suspicious. Surgical clips in the included right abdomen compatible with cholecystectomy. IMPRESSION: Mild cardiomegaly without acute pulmonary process in this low inspiratory portable examination. Electronically Signed   By: Elon Alas M.D.   On: 07/03/2016 20:19   Dg Knee Complete 4 Views Left  Result Date:  07/03/2016 CLINICAL DATA:  PT states she was getting out of the car after her dialysis treatment today and tripped over her rolling walker and landed onto her left leg and buttocks. PT c/o pain to buttocks and down left leg since fall. EXAM: LEFT KNEE - COMPLETE 4+ VIEW COMPARISON:  None. FINDINGS: There is a comminuted fracture of the distal femur. There is a transverse fracture across the proximal metaphysis. There are comminuted fracture components along the lateral margin of the fracture. Fracture is mildly impacted by approximately 1.5 cm. The fracture shows posterior and lateral displacement by just over 1 cm. There is also anterior angulation of the distal fracture component of approximately 20 degrees. There are no other fractures. The bones are diffusely demineralized. The knee joint is normally aligned. There is tricompartmental joint space narrowing with marginal osteophytes consistent with osteoarthritis. No significant joint effusion is noted. There is surrounding soft tissue edema and dense vascular calcifications. IMPRESSION: Comminuted, mildly displaced and impacted and angulated fracture of the distal left femur at the proximal metaphysis. No dislocation. Electronically Signed   By: Lajean Manes M.D.   On: 07/03/2016 19:27   Dg Foot Complete Left  Result Date: 07/03/2016 CLINICAL DATA:  Patient tripped and fell.  Pain. EXAM: LEFT FOOT - COMPLETE 3+ VIEW COMPARISON:  None. FINDINGS: Bones are demineralized. Degenerative changes are seen at the midfoot. Linear lucencies through the proximal aspect of the second, third, and fourth metatarsals is consistent with fracture. Cortical over had with linear lucency in the neck of the second metatarsal noted. Oblique fracture through the base of the great toe distal phalanx appears nonacute. Dense vascular calcifications suggest diabetes. IMPRESSION: 1. Transverse fractures through the proximal second, third, and fourth metatarsals. 2. Question fracture in  the second metatarsal neck. 3. Oblique fracture through the base of the great toe distal phalanx appears nonacute. Electronically Signed   By: Misty Stanley M.D.   On: 07/03/2016 19:25   Dg Hip Unilat With Pelvis 2-3 Views Left  Result Date: 07/03/2016 CLINICAL DATA:  PT states she was getting out of the car after her dialysis treatment today and tripped over her rolling walker and landed onto her left leg and buttocks. PT c/o pain to buttocks and down left leg since fall. EXAM: DG HIP (WITH OR WITHOUT PELVIS) 2-3V LEFT COMPARISON:  None. FINDINGS: No fracture or dislocation.  No bone lesion. Mild concentric hip joint space narrowing, left greater than right. The SI joints and symphysis pubis are normally aligned. Bones are diffusely demineralized. Densely calcified fibroid in the central pelvis. There is dense arterial vascular calcification. IMPRESSION: 1. No fracture, dislocation or acute finding. Electronically Signed   By: Lajean Manes M.D.   On: 07/03/2016 19:29    ROS Blood pressure (!) 126/52, pulse 96, temperature 98.3 F (36.8 C), temperature source Oral, resp. rate 18, height '5\' 6"'  (1.676 m), weight 165 lb (74.8 kg), SpO2 99 %. Physical Exam  Constitutional: She appears well-developed and well-nourished.  HENT:  Head: Normocephalic and atraumatic.  Neck: Normal range of motion. Neck supple. No tracheal deviation present. No  thyromegaly present.  Cardiovascular:  Irregular rhythm.  Respiratory: Effort normal. She has wheezes.  GI: Soft. She exhibits no distension.  Musculoskeletal:  Upper extremity fistula for dialysis. Good range of motion the shoulders elbows good sensation to her hands. No distal pedal pulses are palpable. She has swelling in the thigh related to fracture hematoma. Capillary refill is present at the ankle and foot. No pain with right knee range of motion.    Assessment/Plan: Patient has multiple metatarsal fractures is will be treated conservatively. She has an  unstable supracondylar femur fracture which required stabilization. Plan would be retrograde nail with proximal distal interlock. She'll be nonweightbearing on this which is on the same side of her foot injury. Risks of surgery discussed. She has severe osteopenia and complex multiple medical problems multisystem. This puts her at increased risk for postoperative complications. Plan will be surgery tonight so that she can proceed with dialysis tomorrow.. Plan procedure was discussed with family members and friends. Patient asked appropriate questions. They agree to proceed. Left great toe fracture closed. Consider treatment plan. There is minimal displacement. Marybelle Killings 07/04/2016, 12:47 PM

## 2016-07-04 NOTE — Consult Note (Addendum)
Reason for Consult: Displaced angulated impacted left supracondylar femur fracture, closed Referring Physician: Orvan Falconer MD  [name] is an 83 y.o. female.  HPI: A 15-year-old female dialysis patient with mild dementia and history of multiple falls presented with a fall and inability to ambulate when she fell yesterday leg went underneath her. She suffered multiple fractures of metatarsals 2 through 4 on the left and also the supracondylar femur fracture on the left. Patient has severe calcified PAD. She has no palpable pulses. Diabetes hypertension neuropathy shortness of breath present. Daughters at bedside as well as through her friends to give additional history. Last dialysis catheter placement 2015. Her pain she rates as severe present with motion. She is comfortable supine position with knee immobilizer. She could not walk after she fell. Patient lives alone.  Review of systems: Positive for anemia of chronic disease, chronic dialysis. Mild dementia. Diabetes, acute left knee pain and left foot pain.. Peripheral neuropathy numbness of her feet. Negative for abdominal pain nausea vomiting. No hearing problems.  Past Medical History:  Diagnosis Date  . Absent pedal pulses   . Anemia of chronic disease 10/05/2010  . Anemia, iron deficiency 08/18/2012  . Arthritis    osteoarthritis  . Ataxia involving legs    has had several falls  . Chronic kidney disease    Acute on Chronic  . Dementia    "mild" per MD note  . Diabetes mellitus   . Family history of anesthesia complication    Nephew- had time waking  . High cholesterol   . Hyperlipidemia   . Hypertension   . Neuropathy ([place])   . Shortness of breath     Past Surgical History:  Procedure Laterality Date  . CORONARY ARTERY BYPASS GRAFT  2008  . EYE SURGERY    . Heart bypass surgery  2008  . INSERTION OF DIALYSIS CATHETER Left 09/05/2013   Procedure: INSERTION OF DIALYSIS CATHETER;  Surgeon: Conrad Monona, MD;  Location:  [place];  Service: Vascular;  Laterality: Left;  . left eye surgery     cataract    Family History  Problem Relation Age of Onset  . Diabetes Father   . Cancer Sister   . Aneurysm Mother     Social History:  reports that she has never smoked. She has never used smokeless tobacco. She reports that she does not drink alcohol or use drugs.  Allergies: No Known Allergies  Medications: I have reviewed the patient's current medications.  Results for orders placed or performed during the hospital encounter of 07/03/16 (from the past 48 hour(s))  Basic metabolic panel     Status: Abnormal   Collection Time: 07/03/16  8:39 PM  Result Value Ref Range   Sodium 135 135 - 145 mmol/L   Potassium 3.7 3.5 - 5.1 mmol/L   Chloride 91 (L) 101 - 111 mmol/L   CO2 34 (H) 22 - 32 mmol/L   Glucose, Bld 201 (H) 65 - 99 mg/dL   BUN 16 6 - 20 mg/dL   Creatinine, Ser 3.90 (H) 0.44 - 1.00 mg/dL   Calcium 8.8 (L) 8.9 - 10.3 mg/dL   GFR calc non Af Amer 10 (L) >60 mL/min   GFR calc Af Amer 11 (L) >60 mL/min    Comment: (NOTE) The eGFR has been calculated using the CKD EPI equation. This calculation has not been validated in all clinical situations. eGFR's persistently <60 mL/min signify possible Chronic Kidney Disease.    Anion  gap 10 5 - 15  Troponin I     Status: None   Collection Time: 07/03/16  8:39 PM  Result Value Ref Range   Troponin I <0.03 <0.03 ng/mL  CBC with Differential     Status: Abnormal   Collection Time: 07/03/16  8:39 PM  Result Value Ref Range   WBC 11.0 (H) 4.0 - 10.5 K/uL   RBC 3.59 (L) 3.87 - 5.11 MIL/uL   Hemoglobin 10.5 (L) 12.0 - 15.0 g/dL   HCT 32.4 (L) 36.0 - 46.0 %   MCV 90.3 78.0 - 100.0 fL   MCH 29.2 26.0 - 34.0 pg   MCHC 32.4 30.0 - 36.0 g/dL   RDW 16.5 (H) 11.5 - 15.5 %   Platelets 185 150 - 400 K/uL   Neutrophils Relative % 82 %   Neutro Abs 9.0 (H) 1.7 - 7.7 K/uL   Lymphocytes Relative 10 %   Lymphs Abs 1.1 0.7 - 4.0 K/uL   Monocytes Relative 8 %    Monocytes Absolute 0.9 0.1 - 1.0 K/uL   Eosinophils Relative 0 %   Eosinophils Absolute 0.0 0.0 - 0.7 K/uL   Basophils Relative 0 %   Basophils Absolute 0.0 0.0 - 0.1 K/uL  Glucose, capillary     Status: Abnormal   Collection Time: 07/04/16 12:15 AM  Result Value Ref Range   Glucose-Capillary 169 (H) 65 - 99 mg/dL  Surgical PCR screen     Status: None   Collection Time: 07/04/16  3:18 AM  Result Value Ref Range   MRSA, PCR NEGATIVE NEGATIVE   Staphylococcus aureus NEGATIVE NEGATIVE    Comment:        The Xpert SA Assay (FDA approved for NASAL specimens in patients over 43 years of age), is one component of a comprehensive surveillance program.  Test performance has been validated by [facility] for patients greater than or equal to 39 year old. It is not intended to diagnose infection nor to guide or monitor treatment.   Glucose, capillary     Status: Abnormal   Collection Time: 07/04/16  3:59 AM  Result Value Ref Range   Glucose-Capillary 159 (H) 65 - 99 mg/dL  Glucose, capillary     Status: Abnormal   Collection Time: 07/04/16  8:12 AM  Result Value Ref Range   Glucose-Capillary 159 (H) 65 - 99 mg/dL  CBC with Differential/Platelet     Status: Abnormal   Collection Time: 07/04/16  9:44 AM  Result Value Ref Range   WBC 11.6 (H) 4.0 - 10.5 K/uL   RBC 3.40 (L) 3.87 - 5.11 MIL/uL   Hemoglobin 9.9 (L) 12.0 - 15.0 g/dL   HCT 31.2 (L) 36.0 - 46.0 %   MCV 91.8 78.0 - 100.0 fL   MCH 29.1 26.0 - 34.0 pg   MCHC 31.7 30.0 - 36.0 g/dL   RDW 16.9 (H) 11.5 - 15.5 %   Platelets 183 150 - 400 K/uL   Neutrophils Relative % 79 %   Neutro Abs 9.1 (H) 1.7 - 7.7 K/uL   Lymphocytes Relative 9 %   Lymphs Abs 1.0 0.7 - 4.0 K/uL   Monocytes Relative 12 %   Monocytes Absolute 1.4 (H) 0.1 - 1.0 K/uL   Eosinophils Relative 0 %   Eosinophils Absolute 0.0 0.0 - 0.7 K/uL   Basophils Relative 0 %   Basophils Absolute 0.0 0.0 - 0.1 K/uL  Renal function panel     Status: Abnormal    Collection Time:  07/04/16  9:44 AM  Result Value Ref Range   Sodium 135 135 - 145 mmol/L   Potassium 3.9 3.5 - 5.1 mmol/L   Chloride 92 (L) 101 - 111 mmol/L   CO2 28 22 - 32 mmol/L   Glucose, Bld 213 (H) 65 - 99 mg/dL   BUN 21 (H) 6 - 20 mg/dL   Creatinine, Ser 4.88 (H) 0.44 - 1.00 mg/dL   Calcium 8.8 (L) 8.9 - 10.3 mg/dL   Phosphorus 4.0 2.5 - 4.6 mg/dL   Albumin 3.1 (L) 3.5 - 5.0 g/dL   GFR calc non Af Amer 7 (L) >60 mL/min   GFR calc Af Amer 9 (L) >60 mL/min    Comment: (NOTE) The eGFR has been calculated using the CKD EPI equation. This calculation has not been validated in all clinical situations. eGFR's persistently <60 mL/min signify possible Chronic Kidney Disease.    Anion gap 15 5 - 15    Dg Lumbar Spine Complete  Result Date: 07/03/2016 CLINICAL DATA:  PT states she was getting out of the car after her dialysis treatment today and tripped over her rolling walker and landed onto her left leg and buttocks. PT c/o pain to buttocks and down left leg since fall. EXAM: LUMBAR SPINE - COMPLETE 4+ VIEW COMPARISON:  04/20/2008 FINDINGS: There is a mild compression fracture of L4 with depression of the upper endplate, new since the prior radiographs. No other fractures. Slight, approximately 3 mm, anterolisthesis of L4 on L5, and mild retrolisthesis of L2 on L3 also new since the prior exam. No other spondylolisthesis. Moderate loss of disc height with endplate sclerosis and osteophytes at L2-L3, increased in severity from the prior exam. Moderate loss disc height at L3-L4. Moderate to severe loss of disc height at L4-L5 and moderate loss disc height at L5-S1. Loss of disc height has increased at L3-L4 and L4-L5. Bones are diffusely demineralized. There are dense vascular calcifications. Calcification in the central pelvis is consistent with calcified fibroid, similar to the prior exam. There has been a prior cholecystectomy. IMPRESSION: 1. Mild depression fracture of L4, of unclear  chronicity, but new since the prior lumbar spine radiographs. 2. No other fractures. 3. Degenerative changes as detailed which have progressed since the prior study. Electronically Signed   By: Lajean Manes M.D.   On: 07/03/2016 19:23   Dg Ankle Complete Left  Result Date: 07/03/2016 CLINICAL DATA:  PT states she was getting out of the car after her dialysis treatment today and tripped over her rolling walker and landed onto her left leg and buttocks. PT c/o pain to buttocks and down left leg since fall. EXAM: LEFT ANKLE COMPLETE - 3+ VIEW COMPARISON:  None. FINDINGS: No fracture.  No bone lesion. The ankle mortise is normally spaced and aligned. There are plantar and dorsal calcaneal spurs. Bones are diffusely demineralized. There is mild nonspecific subcutaneous soft tissue edema and dense vascular calcifications. IMPRESSION: No fracture, dislocation or acute finding. Electronically Signed   By: Lajean Manes M.D.   On: 07/03/2016 19:28   Dg Chest Port 1 View  Result Date: 07/03/2016 CLINICAL DATA:  Femur fracture.  History of hypertension. EXAM: PORTABLE CHEST 1 VIEW COMPARISON:  Chest radiograph April 17, 2014 FINDINGS: Cardiac silhouette is mildly enlarged, mediastinal silhouette is unremarkable for this low inspiratory examination with crowded vasculature markings. Calcified aortic knob. Similar fullness of the hila compatible with vascular structures. Status post median sternotomy for CABG. The lungs are clear without pleural effusions or  focal consolidations. Trachea projects midline and there is no pneumothorax. Included soft tissue planes and osseous structures are non-suspicious. Surgical clips in the included right abdomen compatible with cholecystectomy. IMPRESSION: Mild cardiomegaly without acute pulmonary process in this low inspiratory portable examination. Electronically Signed   By: Elon Alas M.D.   On: 07/03/2016 20:19   Dg Knee Complete 4 Views Left  Result Date:  07/03/2016 CLINICAL DATA:  PT states she was getting out of the car after her dialysis treatment today and tripped over her rolling walker and landed onto her left leg and buttocks. PT c/o pain to buttocks and down left leg since fall. EXAM: LEFT KNEE - COMPLETE 4+ VIEW COMPARISON:  None. FINDINGS: There is a comminuted fracture of the distal femur. There is a transverse fracture across the proximal metaphysis. There are comminuted fracture components along the lateral margin of the fracture. Fracture is mildly impacted by approximately 1.5 cm. The fracture shows posterior and lateral displacement by just over 1 cm. There is also anterior angulation of the distal fracture component of approximately 20 degrees. There are no other fractures. The bones are diffusely demineralized. The knee joint is normally aligned. There is tricompartmental joint space narrowing with marginal osteophytes consistent with osteoarthritis. No significant joint effusion is noted. There is surrounding soft tissue edema and dense vascular calcifications. IMPRESSION: Comminuted, mildly displaced and impacted and angulated fracture of the distal left femur at the proximal metaphysis. No dislocation. Electronically Signed   By: Lajean Manes M.D.   On: 07/03/2016 19:27   Dg Foot Complete Left  Result Date: 07/03/2016 CLINICAL DATA:  Patient tripped and fell.  Pain. EXAM: LEFT FOOT - COMPLETE 3+ VIEW COMPARISON:  None. FINDINGS: Bones are demineralized. Degenerative changes are seen at the midfoot. Linear lucencies through the proximal aspect of the second, third, and fourth metatarsals is consistent with fracture. Cortical over had with linear lucency in the neck of the second metatarsal noted. Oblique fracture through the base of the great toe distal phalanx appears nonacute. Dense vascular calcifications suggest diabetes. IMPRESSION: 1. Transverse fractures through the proximal second, third, and fourth metatarsals. 2. Question fracture in  the second metatarsal neck. 3. Oblique fracture through the base of the great toe distal phalanx appears nonacute. Electronically Signed   By: Misty Stanley M.D.   On: 07/03/2016 19:25   Dg Hip Unilat With Pelvis 2-3 Views Left  Result Date: 07/03/2016 CLINICAL DATA:  PT states she was getting out of the car after her dialysis treatment today and tripped over her rolling walker and landed onto her left leg and buttocks. PT c/o pain to buttocks and down left leg since fall. EXAM: DG HIP (WITH OR WITHOUT PELVIS) 2-3V LEFT COMPARISON:  None. FINDINGS: No fracture or dislocation.  No bone lesion. Mild concentric hip joint space narrowing, left greater than right. The SI joints and symphysis pubis are normally aligned. Bones are diffusely demineralized. Densely calcified fibroid in the central pelvis. There is dense arterial vascular calcification. IMPRESSION: 1. No fracture, dislocation or acute finding. Electronically Signed   By: Lajean Manes M.D.   On: 07/03/2016 19:29    ROS Blood pressure (!) 126/52, pulse 96, temperature 98.3 F (36.8 C), temperature source Oral, resp. rate 18, height '5\' 6"'  (1.676 m), weight 165 lb (74.8 kg), SpO2 99 %. Physical Exam  Constitutional: She appears well-developed and well-nourished.  HENT:  Head: Normocephalic and atraumatic.  Neck: Normal range of motion. Neck supple. No tracheal deviation present. No  thyromegaly present.  Cardiovascular:  Irregular rhythm.  Respiratory: Effort normal. She has wheezes.  GI: Soft. She exhibits no distension.  Musculoskeletal:  Upper extremity fistula for dialysis. Good range of motion the shoulders elbows good sensation to her hands. No distal pedal pulses are palpable. She has swelling in the thigh related to fracture hematoma. Capillary refill is present at the ankle and foot. No pain with right knee range of motion.    Assessment/Plan: Patient has multiple metatarsal fractures is will be treated conservatively. She has an  unstable supracondylar femur fracture which required stabilization. Plan would be retrograde nail with proximal distal interlock. She'll be nonweightbearing on this which is on the same side of her foot injury. Risks of surgery discussed. She has severe osteopenia and complex multiple medical problems multisystem. This puts her at increased risk for postoperative complications. Plan will be surgery tonight so that she can proceed with dialysis tomorrow.. Plan procedure was discussed with family members and friends. Patient asked appropriate questions. They agree to proceed. Left great toe fracture closed. Consider treatment plan. There is minimal displacement. Marybelle Killings 07/04/2016, 12:47 PM

## 2016-07-04 NOTE — Progress Notes (Signed)
Orthopedic Tech Progress Note Patient Details:  [name] [birth year] [id number]  Ortho Devices Type of Ortho Device: Postop shoe/boot Ortho Device/Splint Location: lle Ortho Device/Splint Interventions: Application   Irine Heminger 07/04/2016, 1:01 PM

## 2016-07-04 NOTE — Consult Note (Signed)
[facility] Renal Consultation Note    Indication for Consultation:  Management of ESRD/hemodialysis; anemia, hypertension/volume and secondary hyperparathyroidism  HPI: [name] is a 83 y.o. female with ESRD on hemodialysis, DM with neuropathy, HTN, CAD s/p CABGx 5 in 2009, HLD, dementia. She presented to ED yesterday after she fell getting out of car. She tripped over her walker and fell onto hard cement. She was found to have comminuted fracture of the left distal femur and transverse fractures through the proximal second, third, and fourth metatarsals. Ortho has been consulted and planning ORIF today, no operative intervention for foot fractures.  She is seen currently at bedside with family present. Alert and making jokes. No complaints currently says leg pain is controlled with meds. Denies LOC, dizziness, chest pain, SOB, abdominal pain, nausea, vomiting.   Dialyzes at [facility]. Last HD was yesterday 4/5. She completed full treatment and reached EDW. No noted issues on dialysis and is compliant with treatments.   Past Medical History:  Diagnosis Date  . Absent pedal pulses   . Anemia of chronic disease 10/05/2010  . Anemia, iron deficiency 08/18/2012  . Arthritis    osteoarthritis  . Ataxia involving legs    has had several falls  . Chronic kidney disease    Acute on Chronic  . Dementia    "mild" per MD note  . Diabetes mellitus   . Family history of anesthesia complication    Nephew- had time waking  . High cholesterol   . Hyperlipidemia   . Hypertension   . Neuropathy ([place])   . Shortness of breath    Past Surgical History:  Procedure Laterality Date  . CORONARY ARTERY BYPASS GRAFT  2008  . EYE SURGERY    . Heart bypass surgery  2008  . INSERTION OF DIALYSIS CATHETER Left 09/05/2013   Procedure: INSERTION OF DIALYSIS CATHETER;  Surgeon: Conrad Miramar, MD;  Location: [place];  Service: Vascular;  Laterality: Left;  . left eye surgery      cataract   Family History  Problem Relation Age of Onset  . Diabetes Father   . Cancer Sister   . Aneurysm Mother    Social History:  reports that she has never smoked. She has never used smokeless tobacco. She reports that she does not drink alcohol or use drugs. No Known Allergies Prior to Admission medications   Medication Sig Start Date End Date Taking? Authorizing Provider  acetaminophen-codeine (TYLENOL #3) 300-30 MG per tablet Take 1 tablet by mouth every 6 (six) hours as needed for moderate pain. DO NOT EXCEED 3 GM OF APAP/24 HOURS FROM ALL SOURCES 08/14/14   Lauree Chandler, NP  calcitRIOL (ROCALTROL) 0.25 MCG capsule Take 1 capsule (0.25 mcg total) by mouth daily. 08/27/13   Kathie Dike, MD  cilostazol (PLETAL) 100 MG tablet Take 100 mg by mouth 2 (two) times daily. For CIRCULATION or AS DIRECTED by your physician    Historical Provider, MD  donepezil (ARICEPT) 10 MG tablet Take 5 mg by mouth at bedtime. For MEMORY    Historical Provider, MD  erythromycin ophthalmic ointment Place 1 application into both eyes at bedtime. For keratitis    Historical Provider, MD  ferrous sulfate 325 (65 FE) MG tablet Take 325 mg by mouth 2 (two) times daily. As SUPPLEMENT    Historical Provider, MD  folic acid-vitamin b complex-vitamin c-selenium-zinc (DIALYVITE) 3 MG TABS tablet Take 1 tablet by mouth daily.    Historical Provider,  MD  gabapentin (NEURONTIN) 300 MG capsule Take 300 mg by mouth 2 (two) times daily. For NEUROPATHY/NERVE PAIN    Historical Provider, MD  lidocaine-prilocaine (EMLA) cream Apply 1 application topically as needed (On Tues, Thu,and Sat.).    Historical Provider, MD  linagliptin (TRADJENTA) 5 MG TABS tablet Take 5 mg by mouth every morning. For BLOOD SUGARS/DIABETES    Historical Provider, MD  Propylene Glycol (SYSTANE BALANCE) 0.6 % SOLN Apply 1 drop to eye 2 (two) times daily.    Historical Provider, MD  rosuvastatin (CRESTOR) 5 MG tablet Take 5 mg by mouth at bedtime.  For CHOLESTEROL    Historical Provider, MD  sevelamer carbonate (RENVELA) 800 MG tablet Take 1 tablet (800 mg total) by mouth 3 (three) times daily with meals. 08/27/13   Kathie Dike, MD  sodium bicarbonate 650 MG tablet Take 650 mg by mouth 2 (two) times daily.    Historical Provider, MD  torsemide (DEMADEX) 10 MG tablet Take 5 tablets (50 mg total) by mouth daily. 08/27/13   Kathie Dike, MD  Vitamin D, Ergocalciferol, (DRISDOL) 50000 UNITS CAPS capsule Take 1 capsule (50,000 Units total) by mouth every 7 (seven) days. 01/05/13   Rosita Fire, MD   Current Facility-Administered Medications  Medication Dose Route Frequency Provider Last Rate Last Dose  . acetaminophen-codeine (TYLENOL #3) 300-30 MG per tablet 1 tablet  1 tablet Oral Q6H PRN Orvan Falconer, MD   1 tablet at 07/04/16 1023  . calcitRIOL (ROCALTROL) capsule 0.25 mcg  0.25 mcg Oral Daily Orvan Falconer, MD   0.25 mcg at 07/04/16 1006  . ceFAZolin (ANCEF) IVPB 2g/100 mL premix  2 g Intravenous To SS-Surg Lisette Abu, PA-C      . chlorhexidine (HIBICLENS) 4 % liquid 4 application  60 mL Topical Once Lisette Abu, PA-C      . cilostazol (PLETAL) tablet 100 mg  100 mg Oral BID Orvan Falconer, MD   100 mg at 07/04/16 0050  . dextrose 5 %-0.9 % sodium chloride infusion   Intravenous Continuous Orvan Falconer, MD 10 mL/hr at 07/04/16 0051    . donepezil (ARICEPT) tablet 5 mg  5 mg Oral QHS Orvan Falconer, MD   5 mg at 07/04/16 0050  . erythromycin ophthalmic ointment 1 application  1 application Both Eyes QHS Orvan Falconer, MD      . ferrous sulfate tablet 325 mg  325 mg Oral BID Orvan Falconer, MD   325 mg at 07/04/16 1006  . gabapentin (NEURONTIN) capsule 300 mg  300 mg Oral BID Orvan Falconer, MD   300 mg at 07/04/16 1006  . insulin aspart (novoLOG) injection 0-9 Units  0-9 Units Subcutaneous Q4H Orvan Falconer, MD   2 Units at 07/04/16 [phone]  . morphine 2 MG/ML injection 2 mg  2 mg Intravenous Q1H PRN Francine Graven, DO   2 mg at 07/03/16 2052  . morphine 2 MG/ML injection  2 mg  2 mg Intravenous Q2H PRN Orvan Falconer, MD   2 mg at 07/04/16 0854  . polyvinyl alcohol (LIQUIFILM TEARS) 1.4 % ophthalmic solution 1 drop  1 drop Both Eyes BID Orvan Falconer, MD      . povidone-iodine 10 % swab 2 application  2 application Topical Once Lisette Abu, PA-C      . rosuvastatin (CRESTOR) tablet 5 mg  5 mg Oral QHS Orvan Falconer, MD   5 mg at 07/04/16 0050  . sevelamer carbonate (RENVELA) tablet 800 mg  800 mg Oral  TID WC Orvan Falconer, MD      . sodium bicarbonate tablet 650 mg  650 mg Oral BID Orvan Falconer, MD   650 mg at 07/04/16 0050  . torsemide (DEMADEX) tablet 50 mg  50 mg Oral Daily Orvan Falconer, MD      . Derrill Memo ON 07/07/2016] Vitamin D (Ergocalciferol) (DRISDOL) capsule 50,000 Units  50,000 Units Oral Q Mon Orvan Falconer, MD        ROS: As per HPI otherwise negative.  Physical Exam: Vitals:   07/03/16 1930 07/03/16 2157 07/03/16 2305 07/04/16 0359  BP: (!) 125/52 (!) 125/52 (!) 134/96 (!) 126/52  Pulse: 94 94 (!) 103 96  Resp:  15 18 18   Temp:  98.1 F (36.7 C) 98.3 F (36.8 C) 98.3 F (36.8 C)  TempSrc:   Oral Oral  SpO2: 93% 97% 98% 99%  Weight:      Height:         General: WDWN Elderly AAF NAD Head: NCAT sclera not icteric MMM Neck: Supple. No JVD  Lungs: CTA bilaterally without wheezes, rales, or rhonchi. Breathing is unlabored. Heart: RRR with S1 S2 Abdomen: soft NT + BS Lower extremities: L leg in ace wrap from thigh to foot no edema R LE Neuro: A & O  X 3. Moves all extremities spontaneously. Psych:  Responds to questions appropriately with a normal affect. Dialysis Access: LUE AVF +thrill/bruit   Labs: Basic Metabolic Panel:  Recent Labs Lab 07/03/16 2039 07/04/16 0944  NA 135 135  K 3.7 3.9  CL 91* 92*  CO2 34* 28  GLUCOSE 201* 213*  BUN 16 21*  CREATININE 3.90* 4.88*  CALCIUM 8.8* 8.8*  PHOS  --  4.0   Liver Function Tests:  Recent Labs Lab 07/04/16 0944  ALBUMIN 3.1*   No results for input(s): LIPASE, AMYLASE in the last 168 hours. No results  for input(s): AMMONIA in the last 168 hours. CBC:  Recent Labs Lab 07/03/16 2039 07/04/16 0944  WBC 11.0* 11.6*  NEUTROABS 9.0* 9.1*  HGB 10.5* 9.9*  HCT 32.4* 31.2*  MCV 90.3 91.8  PLT 185 183   Cardiac Enzymes:  Recent Labs Lab 07/03/16 2039  TROPONINI <0.03   CBG:  Recent Labs Lab 07/04/16 0015 07/04/16 0359 07/04/16 0812  GLUCAP 169* 159* 159*   Iron Studies: No results for input(s): IRON, TIBC, TRANSFERRIN, FERRITIN in the last 72 hours. Studies/Results: Dg Lumbar Spine Complete  Result Date: 07/03/2016 CLINICAL DATA:  PT states she was getting out of the car after her dialysis treatment today and tripped over her rolling walker and landed onto her left leg and buttocks. PT c/o pain to buttocks and down left leg since fall. EXAM: LUMBAR SPINE - COMPLETE 4+ VIEW COMPARISON:  04/20/2008 FINDINGS: There is a mild compression fracture of L4 with depression of the upper endplate, new since the prior radiographs. No other fractures. Slight, approximately 3 mm, anterolisthesis of L4 on L5, and mild retrolisthesis of L2 on L3 also new since the prior exam. No other spondylolisthesis. Moderate loss of disc height with endplate sclerosis and osteophytes at L2-L3, increased in severity from the prior exam. Moderate loss disc height at L3-L4. Moderate to severe loss of disc height at L4-L5 and moderate loss disc height at L5-S1. Loss of disc height has increased at L3-L4 and L4-L5. Bones are diffusely demineralized. There are dense vascular calcifications. Calcification in the central pelvis is consistent with calcified fibroid, similar to the prior exam. There has been  a prior cholecystectomy. IMPRESSION: 1. Mild depression fracture of L4, of unclear chronicity, but new since the prior lumbar spine radiographs. 2. No other fractures. 3. Degenerative changes as detailed which have progressed since the prior study. Electronically Signed   By: Lajean Manes M.D.   On: 07/03/2016 19:23   Dg  Ankle Complete Left  Result Date: 07/03/2016 CLINICAL DATA:  PT states she was getting out of the car after her dialysis treatment today and tripped over her rolling walker and landed onto her left leg and buttocks. PT c/o pain to buttocks and down left leg since fall. EXAM: LEFT ANKLE COMPLETE - 3+ VIEW COMPARISON:  None. FINDINGS: No fracture.  No bone lesion. The ankle mortise is normally spaced and aligned. There are plantar and dorsal calcaneal spurs. Bones are diffusely demineralized. There is mild nonspecific subcutaneous soft tissue edema and dense vascular calcifications. IMPRESSION: No fracture, dislocation or acute finding. Electronically Signed   By: Lajean Manes M.D.   On: 07/03/2016 19:28   Dg Chest Port 1 View  Result Date: 07/03/2016 CLINICAL DATA:  Femur fracture.  History of hypertension. EXAM: PORTABLE CHEST 1 VIEW COMPARISON:  Chest radiograph April 17, 2014 FINDINGS: Cardiac silhouette is mildly enlarged, mediastinal silhouette is unremarkable for this low inspiratory examination with crowded vasculature markings. Calcified aortic knob. Similar fullness of the hila compatible with vascular structures. Status post median sternotomy for CABG. The lungs are clear without pleural effusions or focal consolidations. Trachea projects midline and there is no pneumothorax. Included soft tissue planes and osseous structures are non-suspicious. Surgical clips in the included right abdomen compatible with cholecystectomy. IMPRESSION: Mild cardiomegaly without acute pulmonary process in this low inspiratory portable examination. Electronically Signed   By: Elon Alas M.D.   On: 07/03/2016 20:19   Dg Knee Complete 4 Views Left  Result Date: 07/03/2016 CLINICAL DATA:  PT states she was getting out of the car after her dialysis treatment today and tripped over her rolling walker and landed onto her left leg and buttocks. PT c/o pain to buttocks and down left leg since fall. EXAM: LEFT KNEE -  COMPLETE 4+ VIEW COMPARISON:  None. FINDINGS: There is a comminuted fracture of the distal femur. There is a transverse fracture across the proximal metaphysis. There are comminuted fracture components along the lateral margin of the fracture. Fracture is mildly impacted by approximately 1.5 cm. The fracture shows posterior and lateral displacement by just over 1 cm. There is also anterior angulation of the distal fracture component of approximately 20 degrees. There are no other fractures. The bones are diffusely demineralized. The knee joint is normally aligned. There is tricompartmental joint space narrowing with marginal osteophytes consistent with osteoarthritis. No significant joint effusion is noted. There is surrounding soft tissue edema and dense vascular calcifications. IMPRESSION: Comminuted, mildly displaced and impacted and angulated fracture of the distal left femur at the proximal metaphysis. No dislocation. Electronically Signed   By: Lajean Manes M.D.   On: 07/03/2016 19:27   Dg Foot Complete Left  Result Date: 07/03/2016 CLINICAL DATA:  Patient tripped and fell.  Pain. EXAM: LEFT FOOT - COMPLETE 3+ VIEW COMPARISON:  None. FINDINGS: Bones are demineralized. Degenerative changes are seen at the midfoot. Linear lucencies through the proximal aspect of the second, third, and fourth metatarsals is consistent with fracture. Cortical over had with linear lucency in the neck of the second metatarsal noted. Oblique fracture through the base of the great toe distal phalanx appears nonacute. Dense vascular  calcifications suggest diabetes. IMPRESSION: 1. Transverse fractures through the proximal second, third, and fourth metatarsals. 2. Question fracture in the second metatarsal neck. 3. Oblique fracture through the base of the great toe distal phalanx appears nonacute. Electronically Signed   By: Misty Stanley M.D.   On: 07/03/2016 19:25   Dg Hip Unilat With Pelvis 2-3 Views Left  Result Date:  07/03/2016 CLINICAL DATA:  PT states she was getting out of the car after her dialysis treatment today and tripped over her rolling walker and landed onto her left leg and buttocks. PT c/o pain to buttocks and down left leg since fall. EXAM: DG HIP (WITH OR WITHOUT PELVIS) 2-3V LEFT COMPARISON:  None. FINDINGS: No fracture or dislocation.  No bone lesion. Mild concentric hip joint space narrowing, left greater than right. The SI joints and symphysis pubis are normally aligned. Bones are diffusely demineralized. Densely calcified fibroid in the central pelvis. There is dense arterial vascular calcification. IMPRESSION: 1. No fracture, dislocation or acute finding. Electronically Signed   By: Lajean Manes M.D.   On: 07/03/2016 19:29    Dialysis Orders:  [facility] -3h 57mins 180 F BFR 400 2K/2.25Ca Na linear UF Prof 4  EDW 73.5 kg -LAVF Heparin 4000 U IV q HD -Mircera 60 mcg IV q 2 weeks (last dose 4/3) - Calcitriol 0.5 mcg PO q HD  OP Labs: Hgb 11.5  Tsat 27% Ca 9.2  Alb 3.9 P 4.7 PTH 578 (03/2016)    Assessment/Plan: 1.  Left distal femur fracture/ L foot fractures- s/p fall per primary ortho following for ORIF today  2.  ESRD -  TTS - HD tomorrow on schedule 3K bath  3.  Hypertension/volume  - BP controlled/ volume ok at edw  4.  Anemia  - Hgb 9.9 - follow on esa 5.  Metabolic bone disease -  Ca/P ok  Cont VDRA/ No binders currently - follow renal panel  6.  Nutrition - renal diet/vitamins/ protein supp for low albumin when diet resumes  7. DM - per primary  8. Dementia - mild   Lynnda Child PA-C [facility] Pager [phone] 07/04/2016, 12:01 PM   Pt seen, examined and agree w A/P as above.  Kelly Splinter MD [employer] pager [phone]   07/04/2016, 4:45 PM

## 2016-07-04 NOTE — Interval H&P Note (Signed)
History and Physical Interval Note:  07/04/2016 4:59 PM  [name]  has presented today for surgery, with the diagnosis of left distal femur fracture  The various methods of treatment have been discussed with the patient and family. After consideration of risks, benefits and other options for treatment, the patient has consented to  Procedure(s): INTRAMEDULLARY (IM) RETROGRADE FEMORAL NAILING (Left) as a surgical intervention .  The patient's history has been reviewed, patient examined, no change in status, stable for surgery.  I have reviewed the patient's chart and labs.  Questions were answered to the patient's satisfaction.     Marybelle Killings

## 2016-07-04 NOTE — Care Management Note (Signed)
Case Management Note  Patient Details  Name: TEODORA BAUMGARTEN MRN: [id number] Date of Birth: December 27, 1930  Subjective/Objective:    83 yr old female s/p fall resulting in a left femur fracture. Patient underwent a left Hip IM nailing.             Action/Plan: patient is active with [facility], may need shortterm rehab at [facility]. Social worker is aware. CM will continue to monitor.   Expected Discharge Date:    pending              Expected Discharge Plan:   [place]  In-House Referral:  Clinical Social Work  Discharge planning Services  CM Consult  Post Acute Care Choice:    Choice offered to:     DME Arranged:    DME Agency:     HH Arranged:    [place] Agency:  NA  Status of Service:  In process, will continue to follow  If discussed at Long Length of Stay Meetings, dates discussed:    Additional Comments:  Ninfa Meeker, RN 07/04/2016, 3:00 PM

## 2016-07-04 NOTE — Progress Notes (Signed)
PROGRESS NOTE    [name]  BTD:[id number] DOB: April 02, 1930 DOA: 07/03/2016 PCP: Jani Gravel, MD    Brief Narrative:  [name] is an 83 y.o. female with hx of dementia, mild DM, ataxia and fall risks, ESRD on HD (T Th Sat), HLD, HTN, fell at home without loss of consciousness, presented to the ER with left leg pain.  Work up in the ER included an X ray of the femur showing comminuted distal femur Fx with some angulation, Xray of the left foot showed toe and metatarsal Fx, LS spine film showed compressive Fx of undetermined age, no Fx of the left pelvis, and CXR showed cardiomegaly and no infiltrate.  Her UA was negative, and lab work is still pending.  Orthopedics was consulted, and Dr Lorin Mercy planned to perform ORIF of the left femur, and foot did not require surgical intervention.  Hospitalist was asked to admit her for same.   Assessment & Plan:   Principal Problem:   Femur fracture, left (HCC) Active Problems:   Hypertension   High cholesterol   Diabetes mellitus   ESRD (end stage renal disease) ([place])   Femur fracture (HCC)   Metatarsal bone fracture   Nondisplaced fracture of second metatarsal bone, left foot, initial encounter for closed fracture  #1 left distal femur fracture comminuted Secondary to mechanical fall. Patient has been seen by orthopedics and scheduled for surgery later today. Patient currently nothing by mouth. Pain management. Per orthopedics.  #2 left foot fracture Per orthopedics no surgical intervention at this time. Pain management. Supportive care. Per orthopedics.  #3 end-stage renal disease on hemodialysis Patient has a seen in consultation by nephrology. Patient on Tuesday Thursday Saturday schedule. Patient scheduled for hemodialysis tomorrow.  #4 diabetes mellitus Sliding scale insulin.  #5 hyperlipidemia Continue statin.  #6 mild dementia Stable. Continue Aricept.   DVT prophylaxis: SCDs Code Status: Full Family Communication:  Updated patient. No family at bedside. Disposition Plan: Pending surgery and PT OT evaluation postoperatively and per orthopedics. Likely skilled nursing facility.   Consultants:   Orthopedics: Dr. Lorin Mercy 07/04/2016  Nephrology: Dr. Jonnie Finner 07/04/2016  Procedures:  Plane. The left foot 07/03/2016  Plain films of the left hip 03/04/2017  Principal's of the left knee 07/03/2016  Plain films of the L-spine 07/03/2016 at slight chest x-ray 07/03/2016  Plain films of the left ankle 07/03/2016  Antimicrobials:   None   Subjective: No chest pain. No shortness of breath. Patient states pain is controlled.  Objective: Vitals:   07/03/16 1930 07/03/16 2157 07/03/16 2305 07/04/16 0359  BP: (!) 125/52 (!) 125/52 (!) 134/96 (!) 126/52  Pulse: 94 94 (!) 103 96  Resp:  15 18 18   Temp:  98.1 F (36.7 C) 98.3 F (36.8 C) 98.3 F (36.8 C)  TempSrc:   Oral Oral  SpO2: 93% 97% 98% 99%  Weight:      Height:        Intake/Output Summary (Last 24 hours) at 07/04/16 1314 Last data filed at 07/04/16 0300  Gross per 24 hour  Intake             21.5 ml  Output                0 ml  Net             21.5 ml   Filed Weights   07/03/16 1447  Weight: 74.8 kg (165 lb)    Examination:  General exam: Appears calm and comfortable  Respiratory system: Clear to auscultation anterior lung fields. Respiratory effort normal. Cardiovascular system: S1 & S2 heard, RRR. No JVD, murmurs, rubs, gallops or clicks. No pedal edema. Gastrointestinal system: Abdomen is nondistended, soft and nontender. No organomegaly or masses felt. Normal bowel sounds heard. Central nervous system: Alert and oriented. No focal neurological deficits. Extremities: Left lower extremity in splint and wrapped. Skin: No rashes, lesions or ulcers Psychiatry: Judgement and insight appear normal. Mood & affect appropriate.     Data Reviewed: I have personally reviewed following labs and imaging studies  CBC:  Recent  Labs Lab 07/03/16 2039 07/04/16 0944  WBC 11.0* 11.6*  NEUTROABS 9.0* 9.1*  HGB 10.5* 9.9*  HCT 32.4* 31.2*  MCV 90.3 91.8  PLT 185 161   Basic Metabolic Panel:  Recent Labs Lab 07/03/16 2039 07/04/16 0944  NA 135 135  K 3.7 3.9  CL 91* 92*  CO2 34* 28  GLUCOSE 201* 213*  BUN 16 21*  CREATININE 3.90* 4.88*  CALCIUM 8.8* 8.8*  PHOS  --  4.0   GFR: Estimated Creatinine Clearance: 9 mL/min (A) (by C-G formula based on SCr of 4.88 mg/dL (H)). Liver Function Tests:  Recent Labs Lab 07/04/16 0944  ALBUMIN 3.1*   No results for input(s): LIPASE, AMYLASE in the last 168 hours. No results for input(s): AMMONIA in the last 168 hours. Coagulation Profile: No results for input(s): INR, PROTIME in the last 168 hours. Cardiac Enzymes:  Recent Labs Lab 07/03/16 2039  TROPONINI <0.03   BNP (last 3 results) No results for input(s): PROBNP in the last 8760 hours. HbA1C: No results for input(s): HGBA1C in the last 72 hours. CBG:  Recent Labs Lab 07/04/16 0015 07/04/16 0359 07/04/16 0812  GLUCAP 169* 159* 159*   Lipid Profile: No results for input(s): CHOL, HDL, LDLCALC, TRIG, CHOLHDL, LDLDIRECT in the last 72 hours. Thyroid Function Tests: No results for input(s): TSH, T4TOTAL, FREET4, T3FREE, THYROIDAB in the last 72 hours. Anemia Panel: No results for input(s): VITAMINB12, FOLATE, FERRITIN, TIBC, IRON, RETICCTPCT in the last 72 hours. Sepsis Labs: No results for input(s): PROCALCITON, LATICACIDVEN in the last 168 hours.  Recent Results (from the past 240 hour(s))  Surgical PCR screen     Status: None   Collection Time: 07/04/16  3:18 AM  Result Value Ref Range Status   MRSA, PCR NEGATIVE NEGATIVE Final   Staphylococcus aureus NEGATIVE NEGATIVE Final    Comment:        The Xpert SA Assay (FDA approved for NASAL specimens in patients over 70 years of age), is one component of a comprehensive surveillance program.  Test performance has been validated by  [facility] for patients greater than or equal to 65 year old. It is not intended to diagnose infection nor to guide or monitor treatment.          Radiology Studies: Dg Lumbar Spine Complete  Result Date: 07/03/2016 CLINICAL DATA:  PT states she was getting out of the car after her dialysis treatment today and tripped over her rolling walker and landed onto her left leg and buttocks. PT c/o pain to buttocks and down left leg since fall. EXAM: LUMBAR SPINE - COMPLETE 4+ VIEW COMPARISON:  04/20/2008 FINDINGS: There is a mild compression fracture of L4 with depression of the upper endplate, new since the prior radiographs. No other fractures. Slight, approximately 3 mm, anterolisthesis of L4 on L5, and mild retrolisthesis of L2 on L3 also new since the prior exam. No other spondylolisthesis.  Moderate loss of disc height with endplate sclerosis and osteophytes at L2-L3, increased in severity from the prior exam. Moderate loss disc height at L3-L4. Moderate to severe loss of disc height at L4-L5 and moderate loss disc height at L5-S1. Loss of disc height has increased at L3-L4 and L4-L5. Bones are diffusely demineralized. There are dense vascular calcifications. Calcification in the central pelvis is consistent with calcified fibroid, similar to the prior exam. There has been a prior cholecystectomy. IMPRESSION: 1. Mild depression fracture of L4, of unclear chronicity, but new since the prior lumbar spine radiographs. 2. No other fractures. 3. Degenerative changes as detailed which have progressed since the prior study. Electronically Signed   By: Lajean Manes M.D.   On: 07/03/2016 19:23   Dg Ankle Complete Left  Result Date: 07/03/2016 CLINICAL DATA:  PT states she was getting out of the car after her dialysis treatment today and tripped over her rolling walker and landed onto her left leg and buttocks. PT c/o pain to buttocks and down left leg since fall. EXAM: LEFT ANKLE COMPLETE - 3+ VIEW  COMPARISON:  None. FINDINGS: No fracture.  No bone lesion. The ankle mortise is normally spaced and aligned. There are plantar and dorsal calcaneal spurs. Bones are diffusely demineralized. There is mild nonspecific subcutaneous soft tissue edema and dense vascular calcifications. IMPRESSION: No fracture, dislocation or acute finding. Electronically Signed   By: Lajean Manes M.D.   On: 07/03/2016 19:28   Dg Chest Port 1 View  Result Date: 07/03/2016 CLINICAL DATA:  Femur fracture.  History of hypertension. EXAM: PORTABLE CHEST 1 VIEW COMPARISON:  Chest radiograph April 17, 2014 FINDINGS: Cardiac silhouette is mildly enlarged, mediastinal silhouette is unremarkable for this low inspiratory examination with crowded vasculature markings. Calcified aortic knob. Similar fullness of the hila compatible with vascular structures. Status post median sternotomy for CABG. The lungs are clear without pleural effusions or focal consolidations. Trachea projects midline and there is no pneumothorax. Included soft tissue planes and osseous structures are non-suspicious. Surgical clips in the included right abdomen compatible with cholecystectomy. IMPRESSION: Mild cardiomegaly without acute pulmonary process in this low inspiratory portable examination. Electronically Signed   By: Elon Alas M.D.   On: 07/03/2016 20:19   Dg Knee Complete 4 Views Left  Result Date: 07/03/2016 CLINICAL DATA:  PT states she was getting out of the car after her dialysis treatment today and tripped over her rolling walker and landed onto her left leg and buttocks. PT c/o pain to buttocks and down left leg since fall. EXAM: LEFT KNEE - COMPLETE 4+ VIEW COMPARISON:  None. FINDINGS: There is a comminuted fracture of the distal femur. There is a transverse fracture across the proximal metaphysis. There are comminuted fracture components along the lateral margin of the fracture. Fracture is mildly impacted by approximately 1.5 cm. The fracture  shows posterior and lateral displacement by just over 1 cm. There is also anterior angulation of the distal fracture component of approximately 20 degrees. There are no other fractures. The bones are diffusely demineralized. The knee joint is normally aligned. There is tricompartmental joint space narrowing with marginal osteophytes consistent with osteoarthritis. No significant joint effusion is noted. There is surrounding soft tissue edema and dense vascular calcifications. IMPRESSION: Comminuted, mildly displaced and impacted and angulated fracture of the distal left femur at the proximal metaphysis. No dislocation. Electronically Signed   By: Lajean Manes M.D.   On: 07/03/2016 19:27   Dg Foot Complete Left  Result  Date: 07/03/2016 CLINICAL DATA:  Patient tripped and fell.  Pain. EXAM: LEFT FOOT - COMPLETE 3+ VIEW COMPARISON:  None. FINDINGS: Bones are demineralized. Degenerative changes are seen at the midfoot. Linear lucencies through the proximal aspect of the second, third, and fourth metatarsals is consistent with fracture. Cortical over had with linear lucency in the neck of the second metatarsal noted. Oblique fracture through the base of the great toe distal phalanx appears nonacute. Dense vascular calcifications suggest diabetes. IMPRESSION: 1. Transverse fractures through the proximal second, third, and fourth metatarsals. 2. Question fracture in the second metatarsal neck. 3. Oblique fracture through the base of the great toe distal phalanx appears nonacute. Electronically Signed   By: Misty Stanley M.D.   On: 07/03/2016 19:25   Dg Hip Unilat With Pelvis 2-3 Views Left  Result Date: 07/03/2016 CLINICAL DATA:  PT states she was getting out of the car after her dialysis treatment today and tripped over her rolling walker and landed onto her left leg and buttocks. PT c/o pain to buttocks and down left leg since fall. EXAM: DG HIP (WITH OR WITHOUT PELVIS) 2-3V LEFT COMPARISON:  None. FINDINGS: No  fracture or dislocation.  No bone lesion. Mild concentric hip joint space narrowing, left greater than right. The SI joints and symphysis pubis are normally aligned. Bones are diffusely demineralized. Densely calcified fibroid in the central pelvis. There is dense arterial vascular calcification. IMPRESSION: 1. No fracture, dislocation or acute finding. Electronically Signed   By: Lajean Manes M.D.   On: 07/03/2016 19:29        Scheduled Meds: . [START ON 07/05/2016] calcitRIOL  0.5 mcg Oral Q T,Th,Sa-HD  .  ceFAZolin (ANCEF) IV  2 g Intravenous To SS-Surg  . chlorhexidine  60 mL Topical Once  . cilostazol  100 mg Oral BID  . donepezil  5 mg Oral QHS  . erythromycin  1 application Both Eyes QHS  . [START ON 07/05/2016] feeding supplement (NEPRO CARB STEADY)  237 mL Oral BID BM  . ferrous sulfate  325 mg Oral BID  . gabapentin  300 mg Oral BID  . insulin aspart  0-9 Units Subcutaneous Q4H  . multivitamin  1 tablet Oral QHS  . polyvinyl alcohol  1 drop Both Eyes BID  . povidone-iodine  2 application Topical Once  . rosuvastatin  5 mg Oral QHS  . sevelamer carbonate  800 mg Oral TID WC   Continuous Infusions: . dextrose 5 % and 0.9% NaCl 10 mL/hr at 07/04/16 0051     LOS: 1 day    Time spent: 13 minutes    Darrien Belter, MD [facility] Pager [phone]  If 7PM-7AM, please contact night-coverage [email or web address] Password [email or web address] 07/04/2016, 1:14 PM

## 2016-07-04 NOTE — Consult Note (Signed)
Reason for Consult:Left femur, foot fxs Referring Physician: Orvan Falconer  [name] is an 83 y.o. female.  HPI: [name] was leaving dialysis yesterday when she got tangled up with her walker and fell onto her left side. She was not able to catch herself with her arms. She had immediate pain and was brought to the ED where she was diagnosed with a supracondylar femur fx and MT fxs of the 2-4 bases. She was admitted by IM for her multiple medical problems. She will receive dialysis again tomorrow.  Past Medical History:  Diagnosis Date  . Absent pedal pulses   . Anemia of chronic disease 10/05/2010  . Anemia, iron deficiency 08/18/2012  . Arthritis    osteoarthritis  . Ataxia involving legs    has had several falls  . Chronic kidney disease    Acute on Chronic  . Dementia    "mild" per MD note  . Diabetes mellitus   . Family history of anesthesia complication    Nephew- had time waking  . High cholesterol   . Hyperlipidemia   . Hypertension   . Neuropathy ([place])   . Shortness of breath     Past Surgical History:  Procedure Laterality Date  . CORONARY ARTERY BYPASS GRAFT  2008  . EYE SURGERY    . Heart bypass surgery  2008  . INSERTION OF DIALYSIS CATHETER Left 09/05/2013   Procedure: INSERTION OF DIALYSIS CATHETER;  Surgeon: Conrad Richland, MD;  Location: [facility];  Service: Vascular;  Laterality: Left;  . left eye surgery     cataract    Family History  Problem Relation Age of Onset  . Diabetes Father   . Cancer Sister   . Aneurysm Mother     Social History:  reports that she has never smoked. She has never used smokeless tobacco. She reports that she does not drink alcohol or use drugs.  Allergies: No Known Allergies  Medications: I have reviewed the patient's current medications.  Results for orders placed or performed during the hospital encounter of 07/03/16 (from the past 48 hour(s))  Basic metabolic panel     Status: Abnormal   Collection Time: 07/03/16  8:39 PM   Result Value Ref Range   Sodium 135 135 - 145 mmol/L   Potassium 3.7 3.5 - 5.1 mmol/L   Chloride 91 (L) 101 - 111 mmol/L   CO2 34 (H) 22 - 32 mmol/L   Glucose, Bld 201 (H) 65 - 99 mg/dL   BUN 16 6 - 20 mg/dL   Creatinine, Ser 3.90 (H) 0.44 - 1.00 mg/dL   Calcium 8.8 (L) 8.9 - 10.3 mg/dL   GFR calc non Af Amer 10 (L) >60 mL/min   GFR calc Af Amer 11 (L) >60 mL/min    Comment: (NOTE) The eGFR has been calculated using the CKD EPI equation. This calculation has not been validated in all clinical situations. eGFR's persistently <60 mL/min signify possible Chronic Kidney Disease.    Anion gap 10 5 - 15  Troponin I     Status: None   Collection Time: 07/03/16  8:39 PM  Result Value Ref Range   Troponin I <0.03 <0.03 ng/mL  CBC with Differential     Status: Abnormal   Collection Time: 07/03/16  8:39 PM  Result Value Ref Range   WBC 11.0 (H) 4.0 - 10.5 K/uL   RBC 3.59 (L) 3.87 - 5.11 MIL/uL   Hemoglobin 10.5 (L) 12.0 - 15.0 g/dL  HCT 32.4 (L) 36.0 - 46.0 %   MCV 90.3 78.0 - 100.0 fL   MCH 29.2 26.0 - 34.0 pg   MCHC 32.4 30.0 - 36.0 g/dL   RDW 16.5 (H) 11.5 - 15.5 %   Platelets 185 150 - 400 K/uL   Neutrophils Relative % 82 %   Neutro Abs 9.0 (H) 1.7 - 7.7 K/uL   Lymphocytes Relative 10 %   Lymphs Abs 1.1 0.7 - 4.0 K/uL   Monocytes Relative 8 %   Monocytes Absolute 0.9 0.1 - 1.0 K/uL   Eosinophils Relative 0 %   Eosinophils Absolute 0.0 0.0 - 0.7 K/uL   Basophils Relative 0 %   Basophils Absolute 0.0 0.0 - 0.1 K/uL  Glucose, capillary     Status: Abnormal   Collection Time: 07/04/16 12:15 AM  Result Value Ref Range   Glucose-Capillary 169 (H) 65 - 99 mg/dL  Surgical PCR screen     Status: None   Collection Time: 07/04/16  3:18 AM  Result Value Ref Range   MRSA, PCR NEGATIVE NEGATIVE   Staphylococcus aureus NEGATIVE NEGATIVE    Comment:        The Xpert SA Assay (FDA approved for NASAL specimens in patients over 22 years of age), is one component of a comprehensive  surveillance program.  Test performance has been validated by [facility] for patients greater than or equal to 50 year old. It is not intended to diagnose infection nor to guide or monitor treatment.   Glucose, capillary     Status: Abnormal   Collection Time: 07/04/16  3:59 AM  Result Value Ref Range   Glucose-Capillary 159 (H) 65 - 99 mg/dL  Glucose, capillary     Status: Abnormal   Collection Time: 07/04/16  8:12 AM  Result Value Ref Range   Glucose-Capillary 159 (H) 65 - 99 mg/dL  CBC with Differential/Platelet     Status: Abnormal   Collection Time: 07/04/16  9:44 AM  Result Value Ref Range   WBC 11.6 (H) 4.0 - 10.5 K/uL   RBC 3.40 (L) 3.87 - 5.11 MIL/uL   Hemoglobin 9.9 (L) 12.0 - 15.0 g/dL   HCT 31.2 (L) 36.0 - 46.0 %   MCV 91.8 78.0 - 100.0 fL   MCH 29.1 26.0 - 34.0 pg   MCHC 31.7 30.0 - 36.0 g/dL   RDW 16.9 (H) 11.5 - 15.5 %   Platelets 183 150 - 400 K/uL   Neutrophils Relative % 79 %   Neutro Abs 9.1 (H) 1.7 - 7.7 K/uL   Lymphocytes Relative 9 %   Lymphs Abs 1.0 0.7 - 4.0 K/uL   Monocytes Relative 12 %   Monocytes Absolute 1.4 (H) 0.1 - 1.0 K/uL   Eosinophils Relative 0 %   Eosinophils Absolute 0.0 0.0 - 0.7 K/uL   Basophils Relative 0 %   Basophils Absolute 0.0 0.0 - 0.1 K/uL    Dg Lumbar Spine Complete  Result Date: 07/03/2016 CLINICAL DATA:  PT states she was getting out of the car after her dialysis treatment today and tripped over her rolling walker and landed onto her left leg and buttocks. PT c/o pain to buttocks and down left leg since fall. EXAM: LUMBAR SPINE - COMPLETE 4+ VIEW COMPARISON:  04/20/2008 FINDINGS: There is a mild compression fracture of L4 with depression of the upper endplate, new since the prior radiographs. No other fractures. Slight, approximately 3 mm, anterolisthesis of L4 on L5, and mild retrolisthesis of L2 on  L3 also new since the prior exam. No other spondylolisthesis. Moderate loss of disc height with endplate sclerosis and  osteophytes at L2-L3, increased in severity from the prior exam. Moderate loss disc height at L3-L4. Moderate to severe loss of disc height at L4-L5 and moderate loss disc height at L5-S1. Loss of disc height has increased at L3-L4 and L4-L5. Bones are diffusely demineralized. There are dense vascular calcifications. Calcification in the central pelvis is consistent with calcified fibroid, similar to the prior exam. There has been a prior cholecystectomy. IMPRESSION: 1. Mild depression fracture of L4, of unclear chronicity, but new since the prior lumbar spine radiographs. 2. No other fractures. 3. Degenerative changes as detailed which have progressed since the prior study. Electronically Signed   By: Lajean Manes M.D.   On: 07/03/2016 19:23   Dg Ankle Complete Left  Result Date: 07/03/2016 CLINICAL DATA:  PT states she was getting out of the car after her dialysis treatment today and tripped over her rolling walker and landed onto her left leg and buttocks. PT c/o pain to buttocks and down left leg since fall. EXAM: LEFT ANKLE COMPLETE - 3+ VIEW COMPARISON:  None. FINDINGS: No fracture.  No bone lesion. The ankle mortise is normally spaced and aligned. There are plantar and dorsal calcaneal spurs. Bones are diffusely demineralized. There is mild nonspecific subcutaneous soft tissue edema and dense vascular calcifications. IMPRESSION: No fracture, dislocation or acute finding. Electronically Signed   By: Lajean Manes M.D.   On: 07/03/2016 19:28   Dg Chest Port 1 View  Result Date: 07/03/2016 CLINICAL DATA:  Femur fracture.  History of hypertension. EXAM: PORTABLE CHEST 1 VIEW COMPARISON:  Chest radiograph April 17, 2014 FINDINGS: Cardiac silhouette is mildly enlarged, mediastinal silhouette is unremarkable for this low inspiratory examination with crowded vasculature markings. Calcified aortic knob. Similar fullness of the hila compatible with vascular structures. Status post median sternotomy for CABG.  The lungs are clear without pleural effusions or focal consolidations. Trachea projects midline and there is no pneumothorax. Included soft tissue planes and osseous structures are non-suspicious. Surgical clips in the included right abdomen compatible with cholecystectomy. IMPRESSION: Mild cardiomegaly without acute pulmonary process in this low inspiratory portable examination. Electronically Signed   By: Elon Alas M.D.   On: 07/03/2016 20:19   Dg Knee Complete 4 Views Left  Result Date: 07/03/2016 CLINICAL DATA:  PT states she was getting out of the car after her dialysis treatment today and tripped over her rolling walker and landed onto her left leg and buttocks. PT c/o pain to buttocks and down left leg since fall. EXAM: LEFT KNEE - COMPLETE 4+ VIEW COMPARISON:  None. FINDINGS: There is a comminuted fracture of the distal femur. There is a transverse fracture across the proximal metaphysis. There are comminuted fracture components along the lateral margin of the fracture. Fracture is mildly impacted by approximately 1.5 cm. The fracture shows posterior and lateral displacement by just over 1 cm. There is also anterior angulation of the distal fracture component of approximately 20 degrees. There are no other fractures. The bones are diffusely demineralized. The knee joint is normally aligned. There is tricompartmental joint space narrowing with marginal osteophytes consistent with osteoarthritis. No significant joint effusion is noted. There is surrounding soft tissue edema and dense vascular calcifications. IMPRESSION: Comminuted, mildly displaced and impacted and angulated fracture of the distal left femur at the proximal metaphysis. No dislocation. Electronically Signed   By: Lajean Manes M.D.   On:  07/03/2016 19:27   Dg Foot Complete Left  Result Date: 07/03/2016 CLINICAL DATA:  Patient tripped and fell.  Pain. EXAM: LEFT FOOT - COMPLETE 3+ VIEW COMPARISON:  None. FINDINGS: Bones are  demineralized. Degenerative changes are seen at the midfoot. Linear lucencies through the proximal aspect of the second, third, and fourth metatarsals is consistent with fracture. Cortical over had with linear lucency in the neck of the second metatarsal noted. Oblique fracture through the base of the great toe distal phalanx appears nonacute. Dense vascular calcifications suggest diabetes. IMPRESSION: 1. Transverse fractures through the proximal second, third, and fourth metatarsals. 2. Question fracture in the second metatarsal neck. 3. Oblique fracture through the base of the great toe distal phalanx appears nonacute. Electronically Signed   By: Misty Stanley M.D.   On: 07/03/2016 19:25   Dg Hip Unilat With Pelvis 2-3 Views Left  Result Date: 07/03/2016 CLINICAL DATA:  PT states she was getting out of the car after her dialysis treatment today and tripped over her rolling walker and landed onto her left leg and buttocks. PT c/o pain to buttocks and down left leg since fall. EXAM: DG HIP (WITH OR WITHOUT PELVIS) 2-3V LEFT COMPARISON:  None. FINDINGS: No fracture or dislocation.  No bone lesion. Mild concentric hip joint space narrowing, left greater than right. The SI joints and symphysis pubis are normally aligned. Bones are diffusely demineralized. Densely calcified fibroid in the central pelvis. There is dense arterial vascular calcification. IMPRESSION: 1. No fracture, dislocation or acute finding. Electronically Signed   By: Lajean Manes M.D.   On: 07/03/2016 19:29    Review of Systems  Constitutional: Negative for weight loss.  HENT: Negative for ear discharge, ear pain, hearing loss and tinnitus.   Eyes: Negative for blurred vision, double vision, photophobia and pain.  Respiratory: Negative for cough, sputum production and shortness of breath.   Cardiovascular: Negative for chest pain.  Gastrointestinal: Negative for abdominal pain, nausea and vomiting.  Genitourinary: Negative for dysuria,  flank pain, frequency and urgency.  Musculoskeletal: Positive for joint pain (Left knee, left foot). Negative for back pain, falls, myalgias and neck pain.  Neurological: Positive for sensory change (Bilateral feet at times). Negative for dizziness, tingling, focal weakness, loss of consciousness and headaches.  Endo/Heme/Allergies: Does not bruise/bleed easily.  Psychiatric/Behavioral: Negative for depression, memory loss and substance abuse. The patient is not nervous/anxious.    Blood pressure (!) 126/52, pulse 96, temperature 98.3 F (36.8 C), temperature source Oral, resp. rate 18, height '5\' 6"'  (1.676 m), weight 74.8 kg (165 lb), SpO2 99 %. Physical Exam  Constitutional: She appears well-developed and well-nourished. No distress.  HENT:  Head: Normocephalic and atraumatic.  Eyes: Conjunctivae are normal. Right eye exhibits no discharge. Left eye exhibits no discharge. No scleral icterus.  Neck: Normal range of motion. Neck supple.  Cardiovascular: Normal rate and normal heart sounds.  An irregularly irregular rhythm present.  Respiratory: Effort normal and breath sounds normal. No respiratory distress. She has no wheezes. She has no rales.  GI: Soft.  Musculoskeletal:  UEx shoulder, elbow, wrist, digits- no skin wounds, nontender, no instability, no blocks to motion  Sens  Ax/R/M/U intact  Mot   Ax/ R/ PIN/ M/ AIN/ U intact  Rad 2+ right, 0 left, fistula in place  LLE No traumatic wounds, ecchymosis, or rash  In splint and KI  Sens DPN, SPN, TN intact  Motor EHL intact  No significant edema   RLE No traumatic wounds, ecchymosis,  or rash  Nontender  No effusions  Knee stable to varus/ valgus and anterior/posterior stress  Sens DPN, SPN, TN intact  Motor EHL, ext, flex, evers 5/5  DP 2+, PT 1+, No significant edema      Lymphadenopathy:    She has no cervical adenopathy.  Neurological: She is alert.  Skin: Skin is warm and dry. She is not diaphoretic.  Psychiatric: She  has a normal mood and affect. Her behavior is normal.    Assessment/Plan: Fall Left supracondylar femur fx -- For retrograde nail later today by Dr. Lorin Mercy. Continue NPO. Left 2-4 MT fxs -- Non-operative. Hard-soled shoe. Multiple medical problems -- per primary service    Lisette Abu, PA-C Orthopedic Surgery [phone] 07/04/2016, 10:59 AM

## 2016-07-04 NOTE — Progress Notes (Signed)
Patient arrived to unit via EMS with minimal belongings.  Oriented x4. Vitals stable. Admission completed. Continue to monitor.

## 2016-07-04 NOTE — Progress Notes (Signed)
Called charge RN on 5N to notify that patient's surgery is cancelled tonight due to emergency in OR. Dr. Lorin Mercy at bedside stating patient can have a renal, carb modified diet until midnight and then npo for surgery in the am. Verbalized understanding and stated she would report off to Tammy, Therapist, sports (patient's primary RN). Awaiting transport team to return patient to room.

## 2016-07-05 ENCOUNTER — Inpatient Hospital Stay (HOSPITAL_COMMUNITY): Payer: Medicare (Managed Care) | Admitting: Anesthesiology

## 2016-07-05 ENCOUNTER — Inpatient Hospital Stay (HOSPITAL_COMMUNITY): Payer: Medicare (Managed Care)

## 2016-07-05 ENCOUNTER — Encounter (HOSPITAL_COMMUNITY): Payer: Self-pay | Admitting: Anesthesiology

## 2016-07-05 ENCOUNTER — Encounter (HOSPITAL_COMMUNITY)
Admission: EM | Disposition: A | Discharge status: Skilled Nursing Facility | Payer: Self-pay | Source: Home / Self Care | Attending: Internal Medicine

## 2016-07-05 DIAGNOSIS — S92345A Nondisplaced fracture of fourth metatarsal bone, left foot, initial encounter for closed fracture: Secondary | ICD-10-CM

## 2016-07-05 DIAGNOSIS — S72461A Displaced supracondylar fracture with intracondylar extension of lower end of right femur, initial encounter for closed fracture: Secondary | ICD-10-CM | POA: Diagnosis present

## 2016-07-05 DIAGNOSIS — R079 Chest pain, unspecified: Secondary | ICD-10-CM

## 2016-07-05 DIAGNOSIS — S72402A Unspecified fracture of lower end of left femur, initial encounter for closed fracture: Secondary | ICD-10-CM

## 2016-07-05 DIAGNOSIS — S92335A Nondisplaced fracture of third metatarsal bone, left foot, initial encounter for closed fracture: Secondary | ICD-10-CM

## 2016-07-05 DIAGNOSIS — I2510 Atherosclerotic heart disease of native coronary artery without angina pectoris: Secondary | ICD-10-CM | POA: Diagnosis present

## 2016-07-05 DIAGNOSIS — S72462A Displaced supracondylar fracture with intracondylar extension of lower end of left femur, initial encounter for closed fracture: Secondary | ICD-10-CM

## 2016-07-05 DIAGNOSIS — I471 Supraventricular tachycardia: Secondary | ICD-10-CM

## 2016-07-05 HISTORY — DX: Atherosclerotic heart disease of native coronary artery without angina pectoris: I25.10

## 2016-07-05 HISTORY — PX: FEMUR IM NAIL: SHX1597

## 2016-07-05 LAB — CBC
HEMATOCRIT: 28.9 % — AB (ref 36.0–46.0)
Hemoglobin: 9.6 g/dL — ABNORMAL LOW (ref 12.0–15.0)
MCH: 29.5 pg (ref 26.0–34.0)
MCHC: 33.2 g/dL (ref 30.0–36.0)
MCV: 88.9 fL (ref 78.0–100.0)
Platelets: 140 10*3/uL — ABNORMAL LOW (ref 150–400)
RBC: 3.25 MIL/uL — AB (ref 3.87–5.11)
RDW: 16 % — AB (ref 11.5–15.5)
WBC: 11 10*3/uL — AB (ref 4.0–10.5)

## 2016-07-05 LAB — TSH: TSH: 1.703 u[IU]/mL (ref 0.350–4.500)

## 2016-07-05 LAB — CREATININE, SERUM
Creatinine, Ser: 7.61 mg/dL — ABNORMAL HIGH (ref 0.44–1.00)
GFR calc non Af Amer: 4 mL/min — ABNORMAL LOW (ref >60)
GFR, EST AFRICAN AMERICAN: 5 mL/min — AB (ref >60)

## 2016-07-05 LAB — RENAL FUNCTION PANEL
Albumin: 2.4 g/dL — ABNORMAL LOW (ref 3.5–5.0)
Anion gap: 15 (ref 5–15)
BUN: 40 mg/dL — ABNORMAL HIGH (ref 6–20)
CALCIUM: 8.4 mg/dL — AB (ref 8.9–10.3)
CHLORIDE: 90 mmol/L — AB (ref 101–111)
CO2: 30 mmol/L (ref 22–32)
CREATININE: 6.94 mg/dL — AB (ref 0.44–1.00)
GFR calc Af Amer: 6 mL/min — ABNORMAL LOW (ref >60)
GFR calc non Af Amer: 5 mL/min — ABNORMAL LOW (ref >60)
GLUCOSE: 163 mg/dL — AB (ref 65–99)
Phosphorus: 5.7 mg/dL — ABNORMAL HIGH (ref 2.5–4.6)
Potassium: 4.5 mmol/L (ref 3.5–5.1)
SODIUM: 135 mmol/L (ref 135–145)

## 2016-07-05 LAB — GLUCOSE, CAPILLARY
GLUCOSE-CAPILLARY: 191 mg/dL — AB (ref 65–99)
Glucose-Capillary: 170 mg/dL — ABNORMAL HIGH (ref 65–99)
Glucose-Capillary: 188 mg/dL — ABNORMAL HIGH (ref 65–99)
Glucose-Capillary: 216 mg/dL — ABNORMAL HIGH (ref 65–99)
Glucose-Capillary: 169 mg/dL — ABNORMAL HIGH (ref 65–99)

## 2016-07-05 LAB — CBC WITH DIFFERENTIAL/PLATELET
BASOS PCT: 0 %
Basophils Absolute: 0 10*3/uL (ref 0.0–0.1)
EOS ABS: 0 10*3/uL (ref 0.0–0.7)
Eosinophils Relative: 0 %
HEMATOCRIT: 24.4 % — AB (ref 36.0–46.0)
HEMOGLOBIN: 7.6 g/dL — AB (ref 12.0–15.0)
LYMPHS ABS: 1.7 10*3/uL (ref 0.7–4.0)
Lymphocytes Relative: 13 %
MCH: 28.5 pg (ref 26.0–34.0)
MCHC: 31.1 g/dL (ref 30.0–36.0)
MCV: 91.4 fL (ref 78.0–100.0)
MONO ABS: 1.6 10*3/uL — AB (ref 0.1–1.0)
MONOS PCT: 12 %
NEUTROS ABS: 10 10*3/uL — AB (ref 1.7–7.7)
Neutrophils Relative %: 76 %
Platelets: 202 10*3/uL (ref 150–400)
RBC: 2.67 MIL/uL — ABNORMAL LOW (ref 3.87–5.11)
RDW: 17.2 % — AB (ref 11.5–15.5)
WBC: 13.3 10*3/uL — ABNORMAL HIGH (ref 4.0–10.5)

## 2016-07-05 LAB — TROPONIN I
TROPONIN I: 0.03 ng/mL — AB (ref <0.03)
Troponin I: 0.06 ng/mL (ref <0.03)

## 2016-07-05 LAB — POCT I-STAT 4, (NA,K, GLUC, HGB,HCT)
GLUCOSE: 159 mg/dL — AB (ref 65–99)
Glucose, Bld: 159 mg/dL — ABNORMAL HIGH (ref 65–99)
HCT: 24 % — ABNORMAL LOW (ref 36.0–46.0)
HCT: 21 % — ABNORMAL LOW (ref 36.0–46.0)
HEMOGLOBIN: 7.1 g/dL — AB (ref 12.0–15.0)
Hemoglobin: 8.2 g/dL — ABNORMAL LOW (ref 12.0–15.0)
POTASSIUM: 3.7 mmol/L (ref 3.5–5.1)
Potassium: 4.5 mmol/L (ref 3.5–5.1)
Sodium: 134 mmol/L — ABNORMAL LOW (ref 135–145)
Sodium: 138 mmol/L (ref 135–145)

## 2016-07-05 LAB — CK TOTAL AND CKMB (NOT AT ARMC)
CK TOTAL: 3140 U/L — AB (ref 38–234)
CK, MB: 15.5 ng/mL — AB (ref 0.5–5.0)
Relative Index: 0.5 (ref 0.0–2.5)

## 2016-07-05 LAB — PREPARE RBC (CROSSMATCH)

## 2016-07-05 LAB — MAGNESIUM: Magnesium: 1.9 mg/dL (ref 1.7–2.4)

## 2016-07-05 SURGERY — INSERTION, INTRAMEDULLARY ROD, FEMUR, RETROGRADE
Anesthesia: General | Site: Leg Upper | Laterality: Left

## 2016-07-05 MED ORDER — ONDANSETRON HCL 4 MG/2ML IJ SOLN
INTRAMUSCULAR | Status: DC | PRN
  Administered 2016-07-05: 4 mg via INTRAVENOUS

## 2016-07-05 MED ORDER — HYDROCODONE-ACETAMINOPHEN 5-325 MG PO TABS: 1.0000 | ORAL_TABLET | Freq: Four times a day (QID) | ORAL | Status: DC | PRN

## 2016-07-05 MED ORDER — FENTANYL CITRATE (PF) 100 MCG/2ML IJ SOLN
INTRAMUSCULAR | Status: DC | PRN
  Administered 2016-07-05: 75 ug via INTRAVENOUS

## 2016-07-05 MED ORDER — ROPINIROLE HCL 1 MG PO TABS
1.0000 mg | ORAL_TABLET | Freq: Two times a day (BID) | ORAL | Status: DC
  Administered 2016-07-06 – 2016-07-10 (×10): 1 mg via ORAL
  Filled 2016-07-05 (×11): qty 1

## 2016-07-05 MED ORDER — ESMOLOL HCL 100 MG/10ML IV SOLN
INTRAVENOUS | Status: DC | PRN
  Administered 2016-07-05: 10 mg via INTRAVENOUS

## 2016-07-05 MED ORDER — ACETAMINOPHEN 325 MG PO TABS
650.0000 mg | ORAL_TABLET | Freq: Four times a day (QID) | ORAL | Status: DC | PRN
  Administered 2016-07-05 – 2016-07-07 (×4): 650 mg via ORAL
  Filled 2016-07-05 (×3): qty 2

## 2016-07-05 MED ORDER — ONDANSETRON HCL 4 MG/2ML IJ SOLN: 4.0000 mg | Freq: Once | INTRAMUSCULAR | Status: DC | PRN

## 2016-07-05 MED ORDER — EPHEDRINE 5 MG/ML INJ
INTRAVENOUS | Status: AC
  Filled 2016-07-05: qty 10

## 2016-07-05 MED ORDER — SUCCINYLCHOLINE CHLORIDE 20 MG/ML IJ SOLN
INTRAMUSCULAR | Status: DC | PRN
  Administered 2016-07-05: 120 mg via INTRAVENOUS

## 2016-07-05 MED ORDER — SODIUM CHLORIDE 0.9 % IV SOLN
Freq: Once | INTRAVENOUS | Status: DC
  Administered 2016-07-05: 11:00:00 via INTRAVENOUS

## 2016-07-05 MED ORDER — SUCCINYLCHOLINE CHLORIDE 200 MG/10ML IV SOSY
PREFILLED_SYRINGE | INTRAVENOUS | Status: AC
  Filled 2016-07-05: qty 10

## 2016-07-05 MED ORDER — PROPOFOL 10 MG/ML IV BOLUS
INTRAVENOUS | Status: AC
  Filled 2016-07-05: qty 20

## 2016-07-05 MED ORDER — HEPARIN SODIUM (PORCINE) 1000 UNIT/ML DIALYSIS
1000.0000 [IU] | INTRAMUSCULAR | Status: DC | PRN
  Filled 2016-07-05: qty 1

## 2016-07-05 MED ORDER — 0.9 % SODIUM CHLORIDE (POUR BTL) OPTIME
TOPICAL | Status: DC | PRN
  Administered 2016-07-05: 1000 mL

## 2016-07-05 MED ORDER — ROCURONIUM BROMIDE 100 MG/10ML IV SOLN
INTRAVENOUS | Status: DC | PRN
  Administered 2016-07-05: 15 mg via INTRAVENOUS

## 2016-07-05 MED ORDER — FENTANYL CITRATE (PF) 100 MCG/2ML IJ SOLN
25.0000 ug | INTRAMUSCULAR | Status: DC | PRN
  Administered 2016-07-05 (×3): 25 ug via INTRAVENOUS

## 2016-07-05 MED ORDER — PROPOFOL 10 MG/ML IV BOLUS
INTRAVENOUS | Status: DC | PRN
  Administered 2016-07-05: 90 mg via INTRAVENOUS

## 2016-07-05 MED ORDER — CEFAZOLIN SODIUM-DEXTROSE 2-4 GM/100ML-% IV SOLN
INTRAVENOUS | Status: AC
  Filled 2016-07-05: qty 100

## 2016-07-05 MED ORDER — LIDOCAINE HCL (CARDIAC) 20 MG/ML IV SOLN
INTRAVENOUS | Status: DC | PRN
  Administered 2016-07-05: 40 mg via INTRAVENOUS

## 2016-07-05 MED ORDER — SODIUM CHLORIDE 0.9 % IV SOLN: Freq: Once | INTRAVENOUS | Status: DC

## 2016-07-05 MED ORDER — MORPHINE SULFATE (PF) 2 MG/ML IV SOLN: 1.0000 mg | INTRAVENOUS | Status: DC | PRN

## 2016-07-05 MED ORDER — PHENYLEPHRINE HCL 10 MG/ML IJ SOLN
INTRAVENOUS | Status: DC | PRN
  Administered 2016-07-05: 75 ug/min via INTRAVENOUS

## 2016-07-05 MED ORDER — PENTAFLUOROPROP-TETRAFLUOROETH EX AERO: 1.0000 "application " | INHALATION_SPRAY | CUTANEOUS | Status: DC | PRN

## 2016-07-05 MED ORDER — LOPERAMIDE HCL 2 MG PO CAPS: 2.0000 mg | ORAL_CAPSULE | Freq: Four times a day (QID) | ORAL | Status: DC | PRN

## 2016-07-05 MED ORDER — LIDOCAINE-PRILOCAINE 2.5-2.5 % EX CREA
1.0000 "application " | TOPICAL_CREAM | CUTANEOUS | Status: DC | PRN
  Filled 2016-07-05: qty 5

## 2016-07-05 MED ORDER — PHENYLEPHRINE HCL 10 MG/ML IJ SOLN
INTRAMUSCULAR | Status: DC | PRN
  Administered 2016-07-05: 120 ug via INTRAVENOUS

## 2016-07-05 MED ORDER — CARVEDILOL 3.125 MG PO TABS
3.1250 mg | ORAL_TABLET | Freq: Two times a day (BID) | ORAL | Status: DC
  Administered 2016-07-06: 3.125 mg via ORAL
  Filled 2016-07-05: qty 1

## 2016-07-05 MED ORDER — BUPIVACAINE HCL (PF) 0.25 % IJ SOLN
INTRAMUSCULAR | Status: AC
  Filled 2016-07-05: qty 30

## 2016-07-05 MED ORDER — FENTANYL CITRATE (PF) 250 MCG/5ML IJ SOLN
INTRAMUSCULAR | Status: AC
  Filled 2016-07-05: qty 5

## 2016-07-05 MED ORDER — ENOXAPARIN SODIUM 30 MG/0.3ML ~~LOC~~ SOLN
30.0000 mg | SUBCUTANEOUS | Status: DC
  Administered 2016-07-06 – 2016-07-09 (×4): 30 mg via SUBCUTANEOUS
  Filled 2016-07-05 (×4): qty 0.3

## 2016-07-05 MED ORDER — SODIUM CHLORIDE 0.9 % IV SOLN
100.0000 mL | INTRAVENOUS | Status: DC | PRN
Start: 2016-07-05 — End: 2016-07-08

## 2016-07-05 MED ORDER — GLYCOPYRROLATE 0.2 MG/ML IJ SOLN
INTRAMUSCULAR | Status: DC | PRN
  Administered 2016-07-05: 0.4 mg via INTRAVENOUS

## 2016-07-05 MED ORDER — NEOSTIGMINE METHYLSULFATE 5 MG/5ML IV SOSY
PREFILLED_SYRINGE | INTRAVENOUS | Status: AC
  Filled 2016-07-05: qty 5

## 2016-07-05 MED ORDER — FENTANYL CITRATE (PF) 100 MCG/2ML IJ SOLN
INTRAMUSCULAR | Status: AC
  Administered 2016-07-05: 25 ug via INTRAVENOUS
  Filled 2016-07-05: qty 2

## 2016-07-05 MED ORDER — ACETAMINOPHEN 325 MG PO TABS
650.0000 mg | ORAL_TABLET | Freq: Once | ORAL | Status: DC
Start: 2016-07-05 — End: 2016-07-10
  Filled 2016-07-05: qty 2

## 2016-07-05 MED ORDER — SODIUM CHLORIDE 0.9 % IV SOLN: 100.0000 mL | INTRAVENOUS | Status: DC | PRN

## 2016-07-05 MED ORDER — ONDANSETRON HCL 4 MG/2ML IJ SOLN
INTRAMUSCULAR | Status: AC
  Filled 2016-07-05: qty 2

## 2016-07-05 MED ORDER — BUPIVACAINE-EPINEPHRINE 0.5% -1:200000 IJ SOLN
INTRAMUSCULAR | Status: DC | PRN
  Administered 2016-07-05: 20 mL

## 2016-07-05 MED ORDER — METOPROLOL TARTRATE 5 MG/5ML IV SOLN
2.0000 mg | INTRAVENOUS | Status: AC | PRN
  Administered 2016-07-05 (×3): 2 mg via INTRAVENOUS

## 2016-07-05 MED ORDER — LIDOCAINE HCL (PF) 1 % IJ SOLN: 5.0000 mL | INTRAMUSCULAR | Status: DC | PRN

## 2016-07-05 MED ORDER — METOPROLOL TARTRATE 5 MG/5ML IV SOLN
INTRAVENOUS | Status: AC
  Administered 2016-07-05: 2 mg via INTRAVENOUS
  Filled 2016-07-05: qty 5

## 2016-07-05 MED ORDER — METOPROLOL TARTRATE 5 MG/5ML IV SOLN
2.5000 mg | INTRAVENOUS | Status: AC | PRN
  Administered 2016-07-06: 2.5 mg via INTRAVENOUS

## 2016-07-05 MED ORDER — NEOSTIGMINE METHYLSULFATE 10 MG/10ML IV SOLN
INTRAVENOUS | Status: DC | PRN
  Administered 2016-07-05: 2 mg via INTRAVENOUS

## 2016-07-05 MED ORDER — EPHEDRINE SULFATE-NACL 50-0.9 MG/10ML-% IV SOSY
PREFILLED_SYRINGE | INTRAVENOUS | Status: DC | PRN
  Administered 2016-07-05: 10 mg via INTRAVENOUS
  Administered 2016-07-05: 5 mg via INTRAVENOUS

## 2016-07-05 MED ORDER — PHENYLEPHRINE 40 MCG/ML (10ML) SYRINGE FOR IV PUSH (FOR BLOOD PRESSURE SUPPORT)
PREFILLED_SYRINGE | INTRAVENOUS | Status: AC
  Filled 2016-07-05: qty 10

## 2016-07-05 MED ORDER — SODIUM CHLORIDE 0.9 % IV SOLN
INTRAVENOUS | Status: DC
Start: 2016-07-05 — End: 2016-07-05

## 2016-07-05 MED ORDER — MENTHOL 3 MG MT LOZG: 1.0000 | LOZENGE | OROMUCOSAL | Status: DC | PRN

## 2016-07-05 MED ORDER — CEFAZOLIN SODIUM-DEXTROSE 2-4 GM/100ML-% IV SOLN
2.0000 g | Freq: Once | INTRAVENOUS | Status: AC
  Administered 2016-07-05: 2 g via INTRAVENOUS

## 2016-07-05 MED ORDER — GLIMEPIRIDE 1 MG PO TABS
1.0000 mg | ORAL_TABLET | Freq: Every day | ORAL | Status: DC
  Administered 2016-07-06: 1 mg via ORAL
  Filled 2016-07-05: qty 1

## 2016-07-05 MED ORDER — ONDANSETRON HCL 4 MG PO TABS: 4.0000 mg | ORAL_TABLET | Freq: Three times a day (TID) | ORAL | Status: DC | PRN

## 2016-07-05 MED ORDER — KETOCONAZOLE 2 % EX CREA
1.0000 "application " | TOPICAL_CREAM | Freq: Every day | CUTANEOUS | Status: DC
  Administered 2016-07-06 – 2016-07-10 (×5): 1 via TOPICAL
  Filled 2016-07-05: qty 15

## 2016-07-05 SURGICAL SUPPLY — 70 items
BANDAGE ACE 4X5 VEL STRL LF (GAUZE/BANDAGES/DRESSINGS) ×3 IMPLANT
BANDAGE ACE 6X5 VEL STRL LF (GAUZE/BANDAGES/DRESSINGS) ×3 IMPLANT
BANDAGE ELASTIC 4 VELCRO ST LF (GAUZE/BANDAGES/DRESSINGS) ×3 IMPLANT
BANDAGE ELASTIC 6 VELCRO ST LF (GAUZE/BANDAGES/DRESSINGS) ×3 IMPLANT
BANDAGE ESMARK 6X9 LF (GAUZE/BANDAGES/DRESSINGS) IMPLANT
BIT DRILL CALIBRATED 4.3MMX365 (DRILL) ×1 IMPLANT
BIT DRILL CROWE PNT TWST 4.5MM (DRILL) ×1 IMPLANT
BLADE CLIPPER SURG (BLADE) IMPLANT
BLADE SURG 15 STRL LF DISP TIS (BLADE) ×1 IMPLANT
BLADE SURG 15 STRL SS (BLADE) ×2
BNDG COHESIVE 6X5 TAN STRL LF (GAUZE/BANDAGES/DRESSINGS) ×3 IMPLANT
BNDG ESMARK 6X9 LF (GAUZE/BANDAGES/DRESSINGS)
BNDG GAUZE ELAST 4 BULKY (GAUZE/BANDAGES/DRESSINGS) ×3 IMPLANT
COVER SURGICAL LIGHT HANDLE (MISCELLANEOUS) ×6 IMPLANT
CUFF TOURNIQUET SINGLE 34IN LL (TOURNIQUET CUFF) ×3 IMPLANT
CUFF TOURNIQUET SINGLE 44IN (TOURNIQUET CUFF) IMPLANT
DRAPE C-ARM 42X72 X-RAY (DRAPES) ×3 IMPLANT
DRAPE IMP U-DRAPE 54X76 (DRAPES) ×3 IMPLANT
DRAPE ORTHO SPLIT 77X108 STRL (DRAPES) ×4
DRAPE PROXIMA HALF (DRAPES) ×6 IMPLANT
DRAPE SURG ORHT 6 SPLT 77X108 (DRAPES) ×2 IMPLANT
DRAPE U-SHAPE 47X51 STRL (DRAPES) ×3 IMPLANT
DRILL CALIBRATED 4.3MMX365 (DRILL) ×3
DRILL CROWE POINT TWIST 4.5MM (DRILL) ×3
DURAPREP 26ML APPLICATOR (WOUND CARE) ×3 IMPLANT
ELECT REM PT RETURN 9FT ADLT (ELECTROSURGICAL) ×3
ELECTRODE REM PT RTRN 9FT ADLT (ELECTROSURGICAL) ×1 IMPLANT
FACESHIELD WRAPAROUND (MASK) IMPLANT
GAUZE SPONGE 4X4 12PLY STRL (GAUZE/BANDAGES/DRESSINGS) ×6 IMPLANT
GAUZE XEROFORM 5X9 LF (GAUZE/BANDAGES/DRESSINGS) ×3 IMPLANT
GLOVE BIOGEL PI IND STRL 8 (GLOVE) ×2 IMPLANT
GLOVE BIOGEL PI INDICATOR 8 (GLOVE) ×4
GLOVE ORTHO TXT STRL SZ7.5 (GLOVE) ×6 IMPLANT
GOWN STRL REUS W/ TWL LRG LVL3 (GOWN DISPOSABLE) ×1 IMPLANT
GOWN STRL REUS W/ TWL XL LVL3 (GOWN DISPOSABLE) ×1 IMPLANT
GOWN STRL REUS W/TWL 2XL LVL3 (GOWN DISPOSABLE) ×3 IMPLANT
GOWN STRL REUS W/TWL LRG LVL3 (GOWN DISPOSABLE) ×2
GOWN STRL REUS W/TWL XL LVL3 (GOWN DISPOSABLE) ×2
GUIDEPIN 3.2X17.5 THRD DISP (PIN) ×6 IMPLANT
GUIDEWIRE BEAD TIP (WIRE) ×3 IMPLANT
KIT BASIN OR (CUSTOM PROCEDURE TRAY) ×3 IMPLANT
KIT ROOM TURNOVER OR (KITS) ×3 IMPLANT
MANIFOLD NEPTUNE II (INSTRUMENTS) ×3 IMPLANT
NAIL FEM RETRO 10.5X380 (Nail) ×3 IMPLANT
NS IRRIG 1000ML POUR BTL (IV SOLUTION) ×3 IMPLANT
PACK GENERAL/GYN (CUSTOM PROCEDURE TRAY) ×3 IMPLANT
PACK UNIVERSAL I (CUSTOM PROCEDURE TRAY) ×3 IMPLANT
PAD ARMBOARD 7.5X6 YLW CONV (MISCELLANEOUS) ×6 IMPLANT
PAD CAST 4YDX4 CTTN HI CHSV (CAST SUPPLIES) ×1 IMPLANT
PADDING CAST COTTON 4X4 STRL (CAST SUPPLIES) ×2
PADDING CAST COTTON 6X4 STRL (CAST SUPPLIES) ×3 IMPLANT
PIN GUIDE DRILL TIP 2.8X300 (DRILL) ×3 IMPLANT
SCREW CANN 70X40X6.5 (Screw) ×1 IMPLANT
SCREW CANNULATED 6.5X70MM (Screw) ×3 IMPLANT
SCREW CANNULATED 6.5X75MM (Screw) ×3 IMPLANT
SCREW CORT TI DBL LEAD 5X32 (Screw) ×3 IMPLANT
SCREW CORT TI DBL LEAD 5X36 (Screw) ×3 IMPLANT
SCREW CORT TI DBL LEAD 5X65 (Screw) ×6 IMPLANT
SCREW CORT TI DBL LEAD 5X70 (Screw) ×3 IMPLANT
SCREW CORT TI DBL LEAD 5X75 (Screw) ×3 IMPLANT
STAPLER VISISTAT 35W (STAPLE) ×3 IMPLANT
STOCKINETTE IMPERVIOUS LG (DRAPES) ×3 IMPLANT
SUT VIC AB 0 CT1 27 (SUTURE) ×2
SUT VIC AB 0 CT1 27XBRD ANBCTR (SUTURE) ×1 IMPLANT
SUT VIC AB 2-0 CT1 27 (SUTURE) ×2
SUT VIC AB 2-0 CT1 TAPERPNT 27 (SUTURE) ×1 IMPLANT
TOWEL OR 17X24 6PK STRL BLUE (TOWEL DISPOSABLE) ×3 IMPLANT
TOWEL OR 17X26 10 PK STRL BLUE (TOWEL DISPOSABLE) ×3 IMPLANT
TRAY FOLEY W/METER SILVER 16FR (SET/KITS/TRAYS/PACK) IMPLANT
WATER STERILE IRR 1000ML POUR (IV SOLUTION) ×3 IMPLANT

## 2016-07-05 NOTE — Progress Notes (Signed)
[facility] Progress Note   Subjective: had surg this am, some postop CP, seen by cards, resolved  Vitals:   07/05/16 1500 07/05/16 1545 07/05/16 1605 07/05/16 1610  BP: 113/65  120/64 124/68  Pulse: 62 68 73 71  Resp: 18 20  20   Temp: 98.2 F (36.8 C) 98.8 F (37.1 C)  98.8 F (37.1 C)  TempSrc: Oral Oral    SpO2: 96%     Weight:      Height:        Inpatient medications: . calcitRIOL  0.5 mcg Oral Q T,Th,Sa-HD  . donepezil  5 mg Oral QHS  . [START ON 07/06/2016] enoxaparin (LOVENOX) injection  30 mg Subcutaneous Q24H  . erythromycin  1 application Both Eyes QHS  . feeding supplement (NEPRO CARB STEADY)  237 mL Oral BID BM  . ferrous sulfate  325 mg Oral BID  . gabapentin  300 mg Oral Daily  . [START ON 07/06/2016] glimepiride  1 mg Oral Q breakfast  . insulin aspart  0-9 Units Subcutaneous Q4H  . ketoconazole  1 application Topical Daily  . multivitamin  1 tablet Oral QHS  . polyvinyl alcohol  1 drop Both Eyes BID  . rOPINIRole  1 mg Oral BID  . rosuvastatin  5 mg Oral QHS  . sevelamer carbonate  800 mg Oral TID WC   . sodium chloride 10 mL/hr at 07/04/16 1747   sodium chloride, sodium chloride, acetaminophen, acetaminophen-codeine, heparin, HYDROcodone-acetaminophen, lidocaine (PF), lidocaine-prilocaine, loperamide, menthol-cetylpyridinium, morphine injection, morphine injection, morphine injection, ondansetron, pentafluoroprop-tetrafluoroeth  Exam: General: WDWN Elderly AAF NAD Head: NCAT sclera not icteric MMM Neck: Supple. No JVD  Lungs: CTA bilaterally without wheezes, rales, or rhonchi. Breathing is unlabored. Heart: RRR with S1 S2 Abdomen: soft NT + BS Lower extremities: L leg in ace wrap from thigh to foot no edema R LE Neuro: A & O  X 3. Moves all extremities spontaneously. Psych:  Responds to questions appropriately with a normal affect. Dialysis Access: LUE AVF +thrill/bruit    Dialysis:  [facility] 3h 30min  2/2.25 bath  P4   73.5kg   Hep 4000  LUA AVF -Mircera 60 mcg IV q 2 weeks (last dose 4/3) - Calcitriol 0.5 mcg PO q HD  OP Labs: Hgb 11.5  Tsat 27% Ca 9.2  Alb 3.9 P 4.7 PTH 578 (03/2016)    Assessment/Plan: 1. Left distal femur fracture - sp ORIF today 2. Chest pain - peri-op, seen by cards, had some MAT, but no MI.  CP resolved after 1min 3. AMS - slightly confused but responsive, postop/ dementia/ ESRD 4. MAT - got IV metoprolol w improvement in HR 5. ESRD TTS HD 6. Vol stable, 1-2kg up 7. Anemia  - Hgb 9.9 - follow on esa 8. Metabolic bone disease -  Ca/P ok  Cont VDRA/ No binders currently - follow renal panel  9. Nutrition - renal diet/vitamins/ protein supp for low albumin when diet resumes  10. DM - per primary  11. Dementia - mild    Plan - pt appears stable for HD tonight, plan HD tonight   Kelly Splinter MD [place] pager [phone]   07/05/2016, 4:20 PM    Recent Labs Lab 07/03/16 2039 07/04/16 0944 07/05/16 0551 07/05/16 0643 07/05/16 1004  NA 135 135 135 134* 138  K 3.7 3.9 4.5 4.5 3.7  CL 91* 92* 90*  --   --   CO2 34* 28 30  --   --  GLUCOSE 201* 213* 163* 159* 159*  BUN 16 21* 40*  --   --   CREATININE 3.90* 4.88* 6.94*  --   --   CALCIUM 8.8* 8.8* 8.4*  --   --   PHOS  --  4.0 5.7*  --   --     Recent Labs Lab 07/04/16 0944 07/05/16 0551  ALBUMIN 3.1* 2.4*    Recent Labs Lab 07/03/16 2039 07/04/16 0944 07/05/16 0551 07/05/16 0643 07/05/16 1004  WBC 11.0* 11.6* 13.3*  --   --   NEUTROABS 9.0* 9.1* 10.0*  --   --   HGB 10.5* 9.9* 7.6* 8.2* 7.1*  HCT 32.4* 31.2* 24.4* 24.0* 21.0*  MCV 90.3 91.8 91.4  --   --   PLT 185 183 202  --   --    Iron/TIBC/Ferritin/ %Sat    Component Value Date/Time   IRON 31 (L) 08/26/2013 0521   TIBC 242 (L) 08/26/2013 0521   FERRITIN 303 (H) 08/26/2013 0521   IRONPCTSAT 13 (L) 08/26/2013 8882

## 2016-07-05 NOTE — Progress Notes (Signed)
Patient to HD 

## 2016-07-05 NOTE — Brief Op Note (Signed)
07/03/2016 - 07/05/2016  10:11 AM  PATIENT:  [name]  83 y.o. female  PRE-OPERATIVE DIAGNOSIS:  left distal femur fracture, closed supracondylar fracture with intercondylar extension  POST-OPERATIVE DIAGNOSIS:  left distal femur fracture  PROCEDURE:  Procedure(s): INTRAMEDULLARY (IM) RETROGRADE FEMORAL NAILING LEFT (Left) Proximal and distal interlock SURGEON:  Surgeon(s) and Role:    * Marybelle Killings, MD - Primary  PHYSICIAN ASSISTANT:   ASSISTANTS: none   ANESTHESIA:   local and general  EBL:  Total I/O In: 1000 [I.V.:1000] Out: 100 [Blood:100]  BLOOD ADMINISTERED:none  DRAINS: none   LOCAL MEDICATIONS USED:  MARCAINE     SPECIMEN:  No Specimen  DISPOSITION OF SPECIMEN:  N/A  COUNTS:  YES  TOURNIQUET:   Total Tourniquet Time Documented: Thigh (Left) - 11 minutes Total: Thigh (Left) - 11 minutes   DICTATION: .Viviann Spare Dictation  PLAN OF CARE: Already inpatient  PATIENT DISPOSITION:  PACU - hemodynamically stable.   Delay start of Pharmacological VTE agent (>24hrs) due to surgical blood loss or risk of bleeding: yes

## 2016-07-05 NOTE — Transfer of Care (Signed)
Immediate Anesthesia Transfer of Care Note  Patient: [name]  Procedure(s) Performed: Procedure(s): INTRAMEDULLARY (IM) RETROGRADE FEMORAL NAILING LEFT (Left)  Patient Location: PACU  Anesthesia Type:General  Level of Consciousness: awake and alert   Airway & Oxygen Therapy: Patient Spontanous Breathing and Patient connected to nasal cannula oxygen  Post-op Assessment: Report given to RN, Post -op Vital signs reviewed and stable and Patient moving all extremities X 4  Post vital signs: Reviewed and stable  Last Vitals:  Vitals:   07/05/16 0948 07/05/16 1003  BP: 114/64 (!) 133/53  Pulse:  (!) 137  Resp: 18 18  Temp: (P) 36.5 C     Last Pain:  Vitals:   07/05/16 0415  TempSrc: Oral  PainSc:       Patients Stated Pain Goal: 3 (03/88/82 8003)  Complications: No apparent anesthesia complications

## 2016-07-05 NOTE — Progress Notes (Signed)
PROGRESS NOTE    [name]  IRS:[id number] DOB: [birth year] DOA: 07/03/2016 PCP: Jani Gravel, MD    Brief Narrative:  [name] is an 83 y.o. female with hx of dementia, mild DM, ataxia and fall risks, ESRD on HD (T Th Sat), HLD, HTN, fell at home without loss of consciousness, presented to the ER with left leg pain.  Work up in the ER included an X ray of the femur showing comminuted distal femur Fx with some angulation, Xray of the left foot showed toe and metatarsal Fx, LS spine film showed compressive Fx of undetermined age, no Fx of the left pelvis, and CXR showed cardiomegaly and no infiltrate.  Her UA was negative, and lab work is still pending.  Orthopedics was consulted, and Dr Lorin Mercy planned to perform ORIF of the left femur, and foot did not require surgical intervention.  Hospitalist was asked to admit her for same.   Assessment & Plan:   Active Problems:   Nondisplaced fracture of second metatarsal bone, left foot, initial encounter for closed fracture   Closed displaced fracture of distal phalanx of left great toe   ESRD on hemodialysis ([place])   Displaced supracondylar fracture with intracondylar extension of lower end of right femur, initial encounter for closed fracture (H[facility])   CAD (coronary artery disease), native coronary artery   Chest pain   Closed fracture of left distal femur (HCC)   Closed nondisplaced fracture of fourth metatarsal bone of left foot   Closed nondisplaced fracture of third metatarsal bone of left foot   Multifocal atrial tachycardia ([place])  #1 left distal femur fracture comminuted Secondary to mechanical fall. Patient has been seen by orthopedics and status post ORIF left closed supracondylar distal femur fracture with intracondylar extension per Dr. Lorin Mercy 07/05/2016.Pain management. Per orthopedics.  #2 left foot fracture Per orthopedics no surgical intervention at this time. Pain management. Supportive care. Per orthopedics.  #3 postop  chest pain/ MAT/tachycardia Postoperatively patient was noted in the recovery room 12 complaints of left-sided chest pain noted also to be tachycardic. Cardiology was consulted as patient with significant coronary artery disease history. Patient was seen in consultation by cardiology and at the time patient chest pain-free. Patient also noted to be anemic with a hemoglobin of 7.1 postprocedure. EKG done showed MAT. Patient given IV metoprolol with improvement with tachycardia. Cardiology recommended serial cardiac enzymes, 2-D echo to evaluate left ventricular ejection fraction and wall motion, to check a TSH. Cardiology recommended beta blocker and aspirin if okay with orthopedics.  #3 end-stage renal disease on hemodialysis Patient has a seen in consultation by nephrology. Patient on Tuesday Thursday Saturday schedule. Patient scheduled for hemodialysis today.  #4 diabetes mellitus Sliding scale insulin.  #5 hyperlipidemia Continue statin.  #6 mild dementia Stable. Continue Aricept.  #7 postop acute blood loss anemia Patient being transfused 2 units packed red blood cells in the PACU to maintain hemoglobin greater than 8-8.5 as patient noted with complaints of chest pain postoperatively and noted to be tachycardic.    DVT prophylaxis: SCDs Code Status: Full Family Communication: Updated patient. No family at bedside. Disposition Plan: Pending surgery and PT OT evaluation postoperatively and per orthopedics. Likely skilled nursing facility.   Consultants:   Orthopedics: Dr. Lorin Mercy 07/04/2016  Nephrology: Dr. Jonnie Finner 07/04/2016  Cardiology: Dr. Wynonia Lawman 07/05/2016  Procedures:  Plane. The left foot 07/03/2016  Plain films of the left hip 03/04/2017  Principal's of the left knee 07/03/2016  Plain films of the L-spine  07/03/2016 at slight chest x-ray 07/03/2016  Plain films of the left ankle 07/03/2016  2 units packed red blood cells being transfused  07/05/2016  Antimicrobials:   None   Subjective: Patient in PACU postoperatively. It was noted postop that patient had some complaints of chest pain and was noted to be tachycardic at that time and given IV Lopressor. Patient currently denies any chest pain no shortness of breath.   Objective: Vitals:   07/05/16 1500 07/05/16 1545 07/05/16 1605 07/05/16 1610  BP: 113/65  120/64 124/68  Pulse: 62 68 73 71  Resp: 18 20  20   Temp: 98.2 F (36.8 C) 98.8 F (37.1 C)  98.8 F (37.1 C)  TempSrc: Oral Oral    SpO2: 96%     Weight:      Height:        Intake/Output Summary (Last 24 hours) at 07/05/16 1950 Last data filed at 07/05/16 1749  Gross per 24 hour  Intake             2425 ml  Output              100 ml  Net             2325 ml   Filed Weights   07/03/16 1447  Weight: 74.8 kg (165 lb)    Examination:  General exam: Appears calm and comfortable,. In PACU. Respiratory system: Clear to auscultation anterior lung fields. Respiratory effort normal. Cardiovascular system: S1 & S2 heard, tachycardia. No JVD, murmurs, rubs, gallops or clicks. No pedal edema. Gastrointestinal system: Abdomen is nondistended, soft and nontender. No organomegaly or masses felt. Normal bowel sounds heard. Central nervous system: Alert and oriented. No focal neurological deficits. Extremities: Left lower extremity in splint and wrapped. Skin: No rashes, lesions or ulcers Psychiatry: Judgement and insight appear fair. Mood & affect appropriate.     Data Reviewed: I have personally reviewed following labs and imaging studies  CBC:  Recent Labs Lab 07/03/16 2039 07/04/16 0944 07/05/16 0551 07/05/16 0643 07/05/16 1004 07/05/16 1802  WBC 11.0* 11.6* 13.3*  --   --  11.0*  NEUTROABS 9.0* 9.1* 10.0*  --   --   --   HGB 10.5* 9.9* 7.6* 8.2* 7.1* 9.6*  HCT 32.4* 31.2* 24.4* 24.0* 21.0* 28.9*  MCV 90.3 91.8 91.4  --   --  88.9  PLT 185 183 202  --   --  710*   Basic Metabolic  Panel:  Recent Labs Lab 07/03/16 2039 07/04/16 0944 07/05/16 0551 07/05/16 0643 07/05/16 1004 07/05/16 1802  NA 135 135 135 134* 138  --   K 3.7 3.9 4.5 4.5 3.7  --   CL 91* 92* 90*  --   --   --   CO2 34* 28 30  --   --   --   GLUCOSE 201* 213* 163* 159* 159*  --   BUN 16 21* 40*  --   --   --   CREATININE 3.90* 4.88* 6.94*  --   --  7.61*  CALCIUM 8.8* 8.8* 8.4*  --   --   --   MG  --   --   --   --   --  1.9  PHOS  --  4.0 5.7*  --   --   --    GFR: Estimated Creatinine Clearance: 5.8 mL/min (A) (by C-G formula based on SCr of 7.61 mg/dL (H)). Liver Function Tests:  Recent Labs Lab 07/04/16  1696 07/05/16 0551  ALBUMIN 3.1* 2.4*   No results for input(s): LIPASE, AMYLASE in the last 168 hours. No results for input(s): AMMONIA in the last 168 hours. Coagulation Profile: No results for input(s): INR, PROTIME in the last 168 hours. Cardiac Enzymes:  Recent Labs Lab 07/03/16 2039 07/05/16 1115 07/05/16 1802  CKTOTAL  --  3,140*  --   CKMB  --  15.5*  --   TROPONINI <0.03 0.03* 0.06*   BNP (last 3 results) No results for input(s): PROBNP in the last 8760 hours. HbA1C: No results for input(s): HGBA1C in the last 72 hours. CBG:  Recent Labs Lab 07/04/16 2020 07/05/16 0010 07/05/16 0417 07/05/16 0952 07/05/16 1604  GLUCAP 192* 191* 170* 188* 216*   Lipid Profile: No results for input(s): CHOL, HDL, LDLCALC, TRIG, CHOLHDL, LDLDIRECT in the last 72 hours. Thyroid Function Tests:  Recent Labs  07/05/16 1802  TSH 1.703   Anemia Panel: No results for input(s): VITAMINB12, FOLATE, FERRITIN, TIBC, IRON, RETICCTPCT in the last 72 hours. Sepsis Labs: No results for input(s): PROCALCITON, LATICACIDVEN in the last 168 hours.  Recent Results (from the past 240 hour(s))  Surgical PCR screen     Status: None   Collection Time: 07/04/16  3:18 AM  Result Value Ref Range Status   MRSA, PCR NEGATIVE NEGATIVE Final   Staphylococcus aureus NEGATIVE NEGATIVE Final     Comment:        The Xpert SA Assay (FDA approved for NASAL specimens in patients over 57 years of age), is one component of a comprehensive surveillance program.  Test performance has been validated by [facility] for patients greater than or equal to 73 year old. It is not intended to diagnose infection nor to guide or monitor treatment.          Radiology Studies: Dg Chest Port 1 View  Result Date: 07/05/2016 CLINICAL DATA:  Chest pain which has resolved but complaint of shakiness EXAM: PORTABLE CHEST 1 VIEW COMPARISON:  Two days ago FINDINGS: Chronic cardiomegaly. Status post CABG. Mildly low lung volumes with interstitial crowding. There is no edema, consolidation, effusion, or pneumothorax. Cholecystectomy clips. IMPRESSION: No evidence of active disease. Electronically Signed   By: Monte Fantasia M.D.   On: 07/05/2016 14:51   Dg Chest Port 1 View  Result Date: 07/03/2016 CLINICAL DATA:  Femur fracture.  History of hypertension. EXAM: PORTABLE CHEST 1 VIEW COMPARISON:  Chest radiograph April 17, 2014 FINDINGS: Cardiac silhouette is mildly enlarged, mediastinal silhouette is unremarkable for this low inspiratory examination with crowded vasculature markings. Calcified aortic knob. Similar fullness of the hila compatible with vascular structures. Status post median sternotomy for CABG. The lungs are clear without pleural effusions or focal consolidations. Trachea projects midline and there is no pneumothorax. Included soft tissue planes and osseous structures are non-suspicious. Surgical clips in the included right abdomen compatible with cholecystectomy. IMPRESSION: Mild cardiomegaly without acute pulmonary process in this low inspiratory portable examination. Electronically Signed   By: Elon Alas M.D.   On: 07/03/2016 20:19   Dg C-arm 1-60 Min  Result Date: 07/05/2016 CLINICAL DATA:  Intramedullary nail. EXAM: DG C-ARM 61-120 MIN; LEFT FEMUR 2 VIEWS COMPARISON:   Radiography 07/03/2016 FINDINGS: Fluoroscopy for retrograde intramedullary femoral nail with multiple distal interlocking screws. Especially in the coronal plane, distal femoral metaphysis fracture has significantly improved alignment. No evidence of intraoperative fracture. IMPRESSION: Fluoroscopy for ORIF of distal femur fracture. No unexpected finding. Electronically Signed   By: Monte Fantasia  M.D.   On: 07/05/2016 09:49   Dg Femur Min 2 Views Left  Result Date: 07/05/2016 CLINICAL DATA:  Intramedullary nail. EXAM: DG C-ARM 61-120 MIN; LEFT FEMUR 2 VIEWS COMPARISON:  Radiography 07/03/2016 FINDINGS: Fluoroscopy for retrograde intramedullary femoral nail with multiple distal interlocking screws. Especially in the coronal plane, distal femoral metaphysis fracture has significantly improved alignment. No evidence of intraoperative fracture. IMPRESSION: Fluoroscopy for ORIF of distal femur fracture. No unexpected finding. Electronically Signed   By: Monte Fantasia M.D.   On: 07/05/2016 09:49        Scheduled Meds: . calcitRIOL  0.5 mcg Oral Q T,Th,Sa-HD  . donepezil  5 mg Oral QHS  . [START ON 07/06/2016] enoxaparin (LOVENOX) injection  30 mg Subcutaneous Q24H  . erythromycin  1 application Both Eyes QHS  . feeding supplement (NEPRO CARB STEADY)  237 mL Oral BID BM  . ferrous sulfate  325 mg Oral BID  . gabapentin  300 mg Oral Daily  . [START ON 07/06/2016] glimepiride  1 mg Oral Q breakfast  . insulin aspart  0-9 Units Subcutaneous Q4H  . ketoconazole  1 application Topical Daily  . multivitamin  1 tablet Oral QHS  . polyvinyl alcohol  1 drop Both Eyes BID  . rOPINIRole  1 mg Oral BID  . rosuvastatin  5 mg Oral QHS  . sevelamer carbonate  800 mg Oral TID WC   Continuous Infusions: . sodium chloride 10 mL/hr at 07/04/16 1747     LOS: 2 days    Time spent: 66 minutes    Liisa Picone, MD [facility] Pager [phone]  If 7PM-7AM, please contact  night-coverage [email or web address] Password [facility] 07/05/2016, 7:49 PM

## 2016-07-05 NOTE — Consult Note (Signed)
Cardiology Consultation Note  Patient ID: [name], MRN: [id number], DOB/AGE: 1930-05-20 83 y.o. Admit date: 07/03/2016   Date of Consult: 07/05/2016 Primary Physician: Jani Gravel, MD Primary Cardiologist: New to [facility] - consult by Wynonia Lawman Requesting Physician: Dr. Grandville Silos, MD  Chief Complaint: Mechanical fall leading to closed displaced angulated impacted left supracondylar femur fracture Reason for Consult: Chest pain s/p surgery  HPI: [name] is a 83 y.o. female who is being seen today for the evaluation of chest pain at the request of Dr. Grandville Silos, MD. Patient has a h/o CAD s/p 5 vessel CABG in 12/2006, reported PAF in 2015 post procedure not on long term full-dose anticoagulation, ESRD on HD (TTS), dementia, multiple mechanical falls, DM, PAD, anemia of chronic disease, HTN, HLD, obesity, and OA who presented to [facility] on 4/5 with mechanical fall leading to the above fracture. She underwent successful ORIF on 4/7. In the recovery room she noted left sided chest pain. Initial troponin 0.03. EKG with MAT. Cardiology asked to evaluate.   Patient previously underwent LHC on 01/21/2007 that showed severe 3-vessel CAD as detailed below. It was recommended she be evaluated for CABG. She underwent successful 5-vessel CABG on 06/22/2006 with LIMA-LAD, sequential SVG-OM1/OM2, SVG Diag, SVG-distal RCA. She was lost to follow up afterwards. In 07/2013 she underwent TTE for murmur that showed EF 55%, no RWMA, GR2DD, moderately calcified mitral annulus with moderate MR directed eccentrically, mild AS, left atrium was moderately dilated, mildly dilated right atrium, PASP 54 mmHg. In 08/2013 she was undergoing dialysis catheter placement when she apparently developed Afib with RVR for a brief amount of time, spontaneously converting to NSR. She was advised to follow up in [place]. For uncertain reasons this did not occur.   Patient presented on 4/5 after suffering a mechanical fall. Apparently, her  walker got stuck on the edge of a curb leading to her fall. She did not hit her head or suffer LOC. She reports seh was on the ground for approximately 1 hour. Her neighbors down the street did seh her and contacted EMS. She was brought to [facility] where she was found to have a closed, displaced angulated impacted left suprandylar femur fracture. Admission EKF with sinus tach, 103 bpm, baseline wandering, supraventricular bigeminy, nonspecific st/t changes. Vitals stable. Admission troponin negative.  HGB 10.5 which is her approximate baseline. K+ 3.9. She underwent successful ORIF on 4/7. Post-op she reported left-sided chest pain with associated nausea and SOB. No associated palpitations, diaphoresis, dizziness, vomiting, presyncope, or syncope. Initial troponin 0.03. CK 3140, CKMB 15.5, Relative index 0.5. She was given units of pRBC for acute blood anemia with post procedure hgb of 7.1. Stat EKG showed MAT, 136 bpm, TWI leads I and aVL, anterolateral horizontal st depression. She was given IV metoprolol for her MAT with improvement to the low 100s bpm. Chest pain resolved after approximately 15 minutes.  Currently remains in MAT and is chest pain free.    Past Medical History:  Diagnosis Date  . Absent pedal pulses   . Anemia of chronic renal failure 10/05/2010   Nice response to Procrit   . Anemia, iron deficiency 08/18/2012  . Ataxia involving legs    has had several falls  . CAD (coronary artery disease), native coronary artery 07/05/2016   3VD by cath 2008  01/22/07 CABG with LIMA to LAD< SVG to dx, SVG to OM1-2, SVG to PDA Dr. Roxan Hockey  . Chronic diastolic heart failure ([place]) 05/23/2014  . Dementia    "  mild" per MD note  . Diabetes mellitus   . ESRD on hemodialysis ([place])   . Family history of anesthesia complication    Nephew- had time waking  . Hyperlipidemia   . Hypertensive heart disease without CHF 11/10/2013  . Neuropathy ([place])   . Osteoarthritis 01/01/2013  . Type 2 diabetes mellitus  with renal manifestations ([place]) 02/01/2014      Most Recent Cardiac Studies: TTE 07/2013: Study Conclusions  - Left ventricle: The cavity size was normal. There was moderate focal basal hypertrophy of the septum. Systolic function was normal. The estimated ejection fraction was in the range of 55% to 60%. Wall motion was normal; there were no regional wall motion abnormalities. Features are consistent with a pseudonormal left ventricular filling pattern, with concomitant abnormal relaxation and increased filling pressure (grade 2 diastolic dysfunction). - Aortic valve: Moderately calcified annulus. Trileaflet; mildly calcified leaflets. There was very mild stenosis. There was no significant regurgitation. Mean gradient (S): 12 mm Hg. Valve area (VTI): 1.64 cm^2. Valve area (Vmax): 1.77 cm^2. - Mitral valve: Calcified annulus. Mildly thickened leaflets . There was moderate regurgitation directed eccentrically. - Left atrium: The atrium was moderately dilated. - Right atrium: The atrium was mildly dilated. Central venous pressure (est): 15 mm Hg. - Atrial septum: No defect or patent foramen ovale was identified. - Tricuspid valve: There was moderate regurgitation. - Pulmonic valve: There was mild regurgitation. - Pulmonary arteries: PA peak pressure: 54 mm Hg (S). - Pericardium, extracardiac: A moderate pericardial effusion was identified posterior to the heart.  Impressions:  - Moderate LV basal septal hypertrophy with LVEF 85-02%, grade 2 diastolic dysfunction. Moderate left atrial enlargement. MAC with mildly thickened mitral leaflets and moderate, eccentric mitral regurgitation. Very mild aortic stenosis. Moderate tricuspid regurgitation with PASP 54 mm mercury. Moderate, posterior pericardial effusion.   LHC/PV angiogram 12/2006: The LIMA is a large vessel, widely patent, and there was no subclavian  stenosis with good antegrade  vertebral flow, although it is tortuous  proximally.   LV angiogram in the RAO projection showed mild hypokinesis of the  anteroapical and inferoapical areas that was fairly extensive, but wall  motion was preserved.  In the LAO projection, there was hypokinesis of  the antero and posteroapical segment.  There was no significant mitral  regurgitation.  EF was greater than 50%.  There was angiographic LVH  present.   The fluoroscopy demonstrated 2-3+ extensive eggshell calcification  throughout the proximal third of the LAD and 2+ calcification of the  proximal right coronary artery.  There was mild mitral annular  calcification.   The main left coronary was normal.   The left anterior descending had 40% narrowing very proximally in the  area of calcification with 70-80% narrowing at the level of the first  diagonal branch involving its origin.  This was fairly focal with  decreased dye density.  There was a second lesion in the mid-LAD at the  area of small second diagonal branch that had 85% stenosis and another  area of 85-90% stenosis just beyond this at the junction of the distal  third of the LAD.  The LAD then coursed to the apex of the heart and  bifurcated.   The first diagonal was long, relatively thin but moderate size, with a  90% stenosis arising after the first septal perforator branch.  The  second diagonal was a small vessel from the mid-LAD and had a 95%  proximal stenosis.   The circumflex artery gave off  a large bifurcating OM1 that had a 95%  proximal stenosis and an 85% proximal/midstenosis before bifurcation.  The circumflex was then 100% occluded after the left atrial branch with  no significant antegrade flow.   The right coronary artery demonstrated 95% proximal stenosis beyond the  proximal bend and a tandem 75% stenosis.  This occurred beyond the large  acute marginal RV branch that supplied grade 3 collaterals to  trifurcating mid and distal  circumflex.  The remainder of the right  coronary had irregularities with high-grade stenosis.  The PDA was  intact.  The bifurcating PLA was intact.   From the standpoint of her coronary disease, she has significant  calcific three-vessel disease with wall motion abnormalities but  preserved motion.  She appears to be a candidate for multivessel CABG in  this setting.   PERIPHERAL ANGIOGRAPHY:  Abdominal angiogram reveals patent celiac and  SMA axis.  The renal arteries are single and normal bilaterally.  The  infrarenal  abdominal aorta is his widely patent and smooth with no  aneurysm or stenosis.  The IMA was intact.   The common, external, and hypogastric arteries were widely patent,  smooth, and normal.  The SFA profunda junctions bilaterally were normal.   The right SFA had 50% narrowing in the proximal third.  There was 40%  narrowing of the left SFA with irregularity but no significant stenosis  in a large vessel.  The popliteals were intact bilaterally.   The right posterior tibial was totally occluded.  The right peroneal was  intact with irregularities throughout.  The RAT had tandem 50% proximal,  90% mid, and segmental 95% mid to distal stenosis.   The left popliteal was intact.  The left posterior tibial was totally  occluded.  The LAD had 90, 95, 95 and 90% tandem stenoses from the  proximal third down to the distal third of the vessel.  The peroneal had  95% stenosis.  There was flow predominately through the LAT and peroneal  to the feet bilaterally with bilateral posterior tibial occlusion.   From the standpoint of her peripheral vascular disease, she has  extensive tibial disease.  There is no critical limb ischemia.  I would  recommend medical therapy including antiplatelet therapy and possible  Pletal therapy for this.  We recommend continuation of statins.  If she  does have multivessel CABG, she would be a candidate to add Plavix to  her regimen.  At  present, we will continue aspirin.   We will obtain CVTS consultation for purpose of consideration for  multivessel CABG in this setting.  She will need x-ray of her chest to  rule out any hilar or left-sided masses.  She also needs further  evaluation of her significant anemia.  As mentioned, she has been on  nonsteroidal Motrin which we will hold, and laboratories are in  progress.  She may need associated GI and possible hematology  consultation.  CABG 12/2006: PROCEDURE:  On January 22, 2007, the patient was taken to the operating  room.  At which time she underwent the following procedure:  Coronary  artery bypass grafting x5.  The following grafts were placed:  1. Left internal mammary artery to the LAD.  2. Sequential saphenous vein graft to the obtuse marginal 1 and obtuse      marginal 2.  3. Saphenous vein graft to the diagonal coronary artery.  4. A saphenous vein graft to the distal right coronary artery.   Surgical History:  Past Surgical History:  Procedure Laterality Date  . CATARACT EXTRACTION     cataract  . CORONARY ARTERY BYPASS GRAFT  2008  . EYE SURGERY    . FEMUR FRACTURE SURGERY    . INSERTION OF DIALYSIS CATHETER Left 09/05/2013   Procedure: INSERTION OF DIALYSIS CATHETER;  Surgeon: Conrad [place], MD;  Location: [facility];  Service: Vascular;  Laterality: Left;     Home Meds: Prior to Admission medications   Medication Sig Start Date End Date Taking? Authorizing Provider  cilostazol (PLETAL) 100 MG tablet Take 100 mg by mouth 2 (two) times daily. For CIRCULATION or AS DIRECTED by your physician   Yes Historical Provider, MD  erythromycin ophthalmic ointment Place 1 application into both eyes at bedtime. For keratitis   Yes Historical Provider, MD  ferrous sulfate 325 (65 FE) MG tablet Take 325 mg by mouth 2 (two) times daily. As SUPPLEMENT   Yes Historical Provider, MD  folic acid-vitamin b complex-vitamin c-selenium-zinc (DIALYVITE) 3 MG TABS tablet Take 1  tablet by mouth daily.   Yes Historical Provider, MD  glimepiride (AMARYL) 1 MG tablet Take 1 mg by mouth daily with breakfast.   Yes Historical Provider, MD  ketoconazole (NIZORAL) 2 % cream Apply 1 application topically daily.   Yes Historical Provider, MD  lidocaine-prilocaine (EMLA) cream Apply 1 application topically as needed (On Tues, Thu,and Sat.).   Yes Historical Provider, MD  linagliptin (TRADJENTA) 5 MG TABS tablet Take 5 mg by mouth every morning. For BLOOD SUGARS/DIABETES   Yes Historical Provider, MD  loperamide (IMODIUM A-D) 2 MG tablet Take 2 mg by mouth 4 (four) times daily as needed for diarrhea or loose stools.   Yes Historical Provider, MD  menthol-cetylpyridinium (CEPACOL) 3 MG lozenge Take 1 lozenge by mouth as needed for sore throat.   Yes Historical Provider, MD  ondansetron (ZOFRAN) 4 MG tablet Take 4 mg by mouth every 8 (eight) hours as needed for nausea or vomiting.   Yes Historical Provider, MD  Propylene Glycol (SYSTANE BALANCE) 0.6 % SOLN Apply 1 drop to eye 2 (two) times daily.   Yes Historical Provider, MD  rOPINIRole (REQUIP) 1 MG tablet Take 1 mg by mouth 2 (two) times daily.   Yes Historical Provider, MD  rosuvastatin (CRESTOR) 5 MG tablet Take 5 mg by mouth at bedtime. For CHOLESTEROL   Yes Historical Provider, MD  sodium bicarbonate 650 MG tablet Take 650 mg by mouth 2 (two) times daily.   Yes Historical Provider, MD  Vitamin D, Ergocalciferol, (DRISDOL) 50000 UNITS CAPS capsule Take 1 capsule (50,000 Units total) by mouth every 7 (seven) days. 01/05/13  Yes Rosita Fire, MD  calcitRIOL (ROCALTROL) 0.25 MCG capsule Take 1 capsule (0.25 mcg total) by mouth daily. Patient not taking: Reported on 07/04/2016 08/27/13   Kathie Dike, MD  donepezil (ARICEPT) 10 MG tablet Take 5 mg by mouth at bedtime. For MEMORY    Historical Provider, MD  gabapentin (NEURONTIN) 300 MG capsule Take 300 mg by mouth at bedtime. For NEUROPATHY/NERVE PAIN    Historical Provider, MD    sevelamer carbonate (RENVELA) 800 MG tablet Take 1 tablet (800 mg total) by mouth 3 (three) times daily with meals. Patient not taking: Reported on 07/04/2016 08/27/13   Kathie Dike, MD  torsemide (DEMADEX) 10 MG tablet Take 5 tablets (50 mg total) by mouth daily. Patient not taking: Reported on 07/04/2016 08/27/13   Kathie Dike, MD    Inpatient Medications:  . calcitRIOL  0.5  mcg Oral Q T,Th,Sa-HD  . cilostazol  100 mg Oral BID  . donepezil  5 mg Oral QHS  . [START ON 07/06/2016] enoxaparin (LOVENOX) injection  30 mg Subcutaneous Q24H  . erythromycin  1 application Both Eyes QHS  . feeding supplement (NEPRO CARB STEADY)  237 mL Oral BID BM  . ferrous sulfate  325 mg Oral BID  . gabapentin  300 mg Oral Daily  . insulin aspart  0-9 Units Subcutaneous Q4H  . multivitamin  1 tablet Oral QHS  . polyvinyl alcohol  1 drop Both Eyes BID  . rosuvastatin  5 mg Oral QHS  . sevelamer carbonate  800 mg Oral TID WC   . sodium chloride 10 mL/hr at 07/04/16 1747  . dextrose 5 % and 0.9% NaCl 10 mL/hr at 07/04/16 0051    Allergies: No Known Allergies  Social History   Social History  . Marital status: Single    Spouse name: N/A  . Number of children: N/A  . Years of education: N/A   Occupational History  . Not on file.   Social History Main Topics  . Smoking status: Never Smoker  . Smokeless tobacco: Never Used  . Alcohol use No  . Drug use: No  . Sexual activity: Not Currently    Birth control/ protection: Post-menopausal   Other Topics Concern  . Not on file   Social History Narrative  . No narrative on file     Family History  Problem Relation Age of Onset  . Diabetes Father   . Cancer Sister   . Aneurysm Mother      Review of Systems: Review of Systems  Unable to perform ROS: Dementia    Labs:  Recent Labs  07/03/16 2039 07/05/16 1115  CKTOTAL  --  3,140*  CKMB  --  15.5*  TROPONINI <0.03 0.03*   Lab Results  Component Value Date   WBC 13.3 (H)  07/05/2016   HGB 7.1 (L) 07/05/2016   HCT 21.0 (L) 07/05/2016   MCV 91.4 07/05/2016   PLT 202 07/05/2016    Recent Labs Lab 07/05/16 0551  07/05/16 1004  NA 135  < > 138  K 4.5  < > 3.7  CL 90*  --   --   CO2 30  --   --   BUN 40*  --   --   CREATININE 6.94*  --   --   CALCIUM 8.4*  --   --   GLUCOSE 163*  < > 159*  < > = values in this interval not displayed. No results found for: CHOL, HDL, LDLCALC, TRIG No results found for: DDIMER  Radiology/Studies:  Dg Lumbar Spine Complete  Result Date: 07/03/2016 CLINICAL DATA:  PT states she was getting out of the car after her dialysis treatment today and tripped over her rolling walker and landed onto her left leg and buttocks. PT c/o pain to buttocks and down left leg since fall. EXAM: LUMBAR SPINE - COMPLETE 4+ VIEW COMPARISON:  04/20/2008 FINDINGS: There is a mild compression fracture of L4 with depression of the upper endplate, new since the prior radiographs. No other fractures. Slight, approximately 3 mm, anterolisthesis of L4 on L5, and mild retrolisthesis of L2 on L3 also new since the prior exam. No other spondylolisthesis. Moderate loss of disc height with endplate sclerosis and osteophytes at L2-L3, increased in severity from the prior exam. Moderate loss disc height at L3-L4. Moderate to severe loss of disc height at  L4-L5 and moderate loss disc height at L5-S1. Loss of disc height has increased at L3-L4 and L4-L5. Bones are diffusely demineralized. There are dense vascular calcifications. Calcification in the central pelvis is consistent with calcified fibroid, similar to the prior exam. There has been a prior cholecystectomy. IMPRESSION: 1. Mild depression fracture of L4, of unclear chronicity, but new since the prior lumbar spine radiographs. 2. No other fractures. 3. Degenerative changes as detailed which have progressed since the prior study. Electronically Signed   By: Lajean Manes M.D.   On: 07/03/2016 19:23   Dg Ankle  Complete Left  Result Date: 07/03/2016 CLINICAL DATA:  PT states she was getting out of the car after her dialysis treatment today and tripped over her rolling walker and landed onto her left leg and buttocks. PT c/o pain to buttocks and down left leg since fall. EXAM: LEFT ANKLE COMPLETE - 3+ VIEW COMPARISON:  None. FINDINGS: No fracture.  No bone lesion. The ankle mortise is normally spaced and aligned. There are plantar and dorsal calcaneal spurs. Bones are diffusely demineralized. There is mild nonspecific subcutaneous soft tissue edema and dense vascular calcifications. IMPRESSION: No fracture, dislocation or acute finding. Electronically Signed   By: Lajean Manes M.D.   On: 07/03/2016 19:28   Dg Chest Port 1 View  Result Date: 07/03/2016 CLINICAL DATA:  Femur fracture.  History of hypertension. EXAM: PORTABLE CHEST 1 VIEW COMPARISON:  Chest radiograph April 17, 2014 FINDINGS: Cardiac silhouette is mildly enlarged, mediastinal silhouette is unremarkable for this low inspiratory examination with crowded vasculature markings. Calcified aortic knob. Similar fullness of the hila compatible with vascular structures. Status post median sternotomy for CABG. The lungs are clear without pleural effusions or focal consolidations. Trachea projects midline and there is no pneumothorax. Included soft tissue planes and osseous structures are non-suspicious. Surgical clips in the included right abdomen compatible with cholecystectomy. IMPRESSION: Mild cardiomegaly without acute pulmonary process in this low inspiratory portable examination. Electronically Signed   By: Elon Alas M.D.   On: 07/03/2016 20:19   Dg Knee Complete 4 Views Left  Result Date: 07/03/2016 CLINICAL DATA:  PT states she was getting out of the car after her dialysis treatment today and tripped over her rolling walker and landed onto her left leg and buttocks. PT c/o pain to buttocks and down left leg since fall. EXAM: LEFT KNEE - COMPLETE  4+ VIEW COMPARISON:  None. FINDINGS: There is a comminuted fracture of the distal femur. There is a transverse fracture across the proximal metaphysis. There are comminuted fracture components along the lateral margin of the fracture. Fracture is mildly impacted by approximately 1.5 cm. The fracture shows posterior and lateral displacement by just over 1 cm. There is also anterior angulation of the distal fracture component of approximately 20 degrees. There are no other fractures. The bones are diffusely demineralized. The knee joint is normally aligned. There is tricompartmental joint space narrowing with marginal osteophytes consistent with osteoarthritis. No significant joint effusion is noted. There is surrounding soft tissue edema and dense vascular calcifications. IMPRESSION: Comminuted, mildly displaced and impacted and angulated fracture of the distal left femur at the proximal metaphysis. No dislocation. Electronically Signed   By: Lajean Manes M.D.   On: 07/03/2016 19:27   Dg Foot Complete Left  Result Date: 07/03/2016 CLINICAL DATA:  Patient tripped and fell.  Pain. EXAM: LEFT FOOT - COMPLETE 3+ VIEW COMPARISON:  None. FINDINGS: Bones are demineralized. Degenerative changes are seen at the midfoot. Linear  lucencies through the proximal aspect of the second, third, and fourth metatarsals is consistent with fracture. Cortical over had with linear lucency in the neck of the second metatarsal noted. Oblique fracture through the base of the great toe distal phalanx appears nonacute. Dense vascular calcifications suggest diabetes. IMPRESSION: 1. Transverse fractures through the proximal second, third, and fourth metatarsals. 2. Question fracture in the second metatarsal neck. 3. Oblique fracture through the base of the great toe distal phalanx appears nonacute. Electronically Signed   By: Misty Stanley M.D.   On: 07/03/2016 19:25   Dg C-arm 1-60 Min  Result Date: 07/05/2016 CLINICAL DATA:   Intramedullary nail. EXAM: DG C-ARM 61-120 MIN; LEFT FEMUR 2 VIEWS COMPARISON:  Radiography 07/03/2016 FINDINGS: Fluoroscopy for retrograde intramedullary femoral nail with multiple distal interlocking screws. Especially in the coronal plane, distal femoral metaphysis fracture has significantly improved alignment. No evidence of intraoperative fracture. IMPRESSION: Fluoroscopy for ORIF of distal femur fracture. No unexpected finding. Electronically Signed   By: Monte Fantasia M.D.   On: 07/05/2016 09:49   Dg Hip Unilat With Pelvis 2-3 Views Left  Result Date: 07/03/2016 CLINICAL DATA:  PT states she was getting out of the car after her dialysis treatment today and tripped over her rolling walker and landed onto her left leg and buttocks. PT c/o pain to buttocks and down left leg since fall. EXAM: DG HIP (WITH OR WITHOUT PELVIS) 2-3V LEFT COMPARISON:  None. FINDINGS: No fracture or dislocation.  No bone lesion. Mild concentric hip joint space narrowing, left greater than right. The SI joints and symphysis pubis are normally aligned. Bones are diffusely demineralized. Densely calcified fibroid in the central pelvis. There is dense arterial vascular calcification. IMPRESSION: 1. No fracture, dislocation or acute finding. Electronically Signed   By: Lajean Manes M.D.   On: 07/03/2016 19:29   Dg Femur Min 2 Views Left  Result Date: 07/05/2016 CLINICAL DATA:  Intramedullary nail. EXAM: DG C-ARM 61-120 MIN; LEFT FEMUR 2 VIEWS COMPARISON:  Radiography 07/03/2016 FINDINGS: Fluoroscopy for retrograde intramedullary femoral nail with multiple distal interlocking screws. Especially in the coronal plane, distal femoral metaphysis fracture has significantly improved alignment. No evidence of intraoperative fracture. IMPRESSION: Fluoroscopy for ORIF of distal femur fracture. No unexpected finding. Electronically Signed   By: Monte Fantasia M.D.   On: 07/05/2016 09:49    EKG: Interpreted by me showed: MAT, 136 bpm, TWI  I, aVL, nonspecific anterolateral horizontal st depression Telemetry: Interpreted by me showed: MAT, 130s to low 100s bpm  Weights: Filed Weights   07/03/16 1447  Weight: 165 lb (74.8 kg)     Physical Exam: Blood pressure (!) 146/74, pulse (!) 110, temperature 97.5 F (36.4 C), resp. rate (!) 22, height _0  (1.676 m), weight 165 lb (74.8 kg), SpO2 (!) 85 %. Body mass index is 26.63 kg/m. General: Well developed, well nourished, in no acute distress. Head: Normocephalic, atraumatic, sclera non-icteric, no xanthomas, nares are without discharge.  Neck: Negative for carotid bruits. JVD not elevated. Lungs: Clear bilaterally to auscultation without wheezes, rales, or rhonchi. Breathing is unlabored. Heart: RRR with S1 S2. II/VI systolic murmur, no rubs, or gallops appreciated. Abdomen: Soft, non-tender, non-distended with normoactive bowel sounds. No hepatomegaly. No rebound/guarding. No obvious abdominal masses. Msk:  Strength and tone appear normal for age. Extremities: No clubbing or cyanosis. No edema. Distal pedal pulses are 2+ and equal bilaterally. LLE in bandage.  Neuro: Alert. No facial asymmetry. No focal deficit. Moves all extremities spontaneously. Psych:  Responds to questions appropriately with a normal affect.    Assessment and Plan:  Active Problems:   Nondisplaced fracture of second metatarsal bone, left foot, initial encounter for closed fracture   Closed displaced fracture of distal phalanx of left great toe   ESRD on hemodialysis ([place])   Displaced supracondylar fracture with intracondylar extension of lower end of right femur, initial encounter for closed fracture ([place])   CAD (coronary artery disease), native coronary artery    1. Chest pain/CAD s/p CABG as above: -Brief episode in PACU lasting approximately 15 minutes before self resolving -Currently chest pain free -Initial troponin 0.03, continue to cycle to rule out -Check echo to evaluate LVSF and wall  motion -Hold on heparin gtt 2/2 recent surgery and the above -ASA if ok with ortho -Beta blocker -Check lipid and A1c for further risk stratification -If troponin does not trend significantly upwards and it echo is relatively normal would not pursue ischemic intervention at this time -Cannot rule out PE/fat embolism -Check echo as above to evaluate for RV dilatation  -D-dimer unhelpful at this time -Would ideally like to avoid CTA if possible -Not on heparin as above -Oxygen saturations in the mid 90s on [place]  2. MAT: -Asymptomatic -Beta blocker as above -Possibly in the setting of her acute trauma/ABL anemia -Check magnesium with recommendation to replete to goal of at least 2.0 -Check TSH -K+ deferred to nephrology   3. ESRD on HD: -Dialysis TTS -Per primary  4. Closed displaced angulated left femur fracture: -S/p ORIF -Per ortho  5. ABL anemia/anemia of chronic disease: -Maintain HGB > 8-8.5  6. Dementia   Signed, Christell Faith, PA-C [place] Pager: [phone] 07/05/2016, 2:29 PM

## 2016-07-05 NOTE — Anesthesia Procedure Notes (Signed)
Procedure Name: Intubation Date/Time: 07/05/2016 7:38 AM Performed by: Kyung Rudd Pre-anesthesia Checklist: Patient identified, Emergency Drugs available, Suction available, Patient being monitored and Timeout performed Patient Re-evaluated:Patient Re-evaluated prior to inductionOxygen Delivery Method: Circle system utilized Preoxygenation: Pre-oxygenation with 100% oxygen Intubation Type: IV induction Laryngoscope Size: Mac and 4 Grade View: Grade I Tube type: Oral Tube size: 7.0 mm Number of attempts: 1 Airway Equipment and Method: Stylet Placement Confirmation: ETT inserted through vocal cords under direct vision,  positive ETCO2 and breath sounds checked- equal and bilateral Secured at: 21 cm Tube secured with: Tape Dental Injury: Teeth and Oropharynx as per pre-operative assessment

## 2016-07-05 NOTE — Anesthesia Postprocedure Evaluation (Addendum)
Anesthesia Post Note  Patient: [name]  Procedure(s) Performed: Procedure(s) (LRB): INTRAMEDULLARY (IM) RETROGRADE FEMORAL NAILING LEFT (Left)  Patient location during evaluation: PACU Anesthesia Type: General Level of consciousness: awake, confused and oriented Vital Signs Assessment: vitals unstable Respiratory status: spontaneous breathing and nonlabored ventilation Cardiovascular status: blood pressure returned to baseline Anesthetic complications: no       Last Vitals:  Vitals:   07/05/16 1610 07/05/16 2024  BP: 124/68 (!) 104/40  Pulse: 71 (!) 117  Resp: 20 18  Temp: 37.1 C (!) 38.2 C    Last Pain:  Vitals:   07/05/16 2032  TempSrc:   PainSc: 3                  Jarae Nemmers COKER

## 2016-07-05 NOTE — Anesthesia Preprocedure Evaluation (Addendum)
Anesthesia Evaluation  Patient identified by MRN, date of birth, ID band Patient awake    Reviewed: Allergy & Precautions, NPO status , Patient's Chart, lab work & pertinent test results  Airway Mallampati: III     Mouth opening: Limited Mouth Opening  Dental  (+) Edentulous Upper, Edentulous Lower   Pulmonary    breath sounds clear to auscultation       Cardiovascular hypertension,  Rhythm:Regular Rate:Tachycardia     Neuro/Psych    GI/Hepatic   Endo/Other  diabetes  Renal/GU      Musculoskeletal   Abdominal   Peds  Hematology   Anesthesia Other Findings   Reproductive/Obstetrics                            Anesthesia Physical Anesthesia Plan  ASA: III  Anesthesia Plan: General   Post-op Pain Management:    Induction: Intravenous  Airway Management Planned: Oral ETT  Additional Equipment:   Intra-op Plan:   Post-operative Plan: Extubation in OR  Informed Consent: I have reviewed the patients History and Physical, chart, labs and discussed the procedure including the risks, benefits and alternatives for the proposed anesthesia with the patient or authorized representative who has indicated his/her understanding and acceptance.   Dental advisory given  Plan Discussed with: Anesthesiologist and CRNA  Anesthesia Plan Comments:         Anesthesia Quick Evaluation

## 2016-07-05 NOTE — Progress Notes (Signed)
Patient to OR

## 2016-07-05 NOTE — Op Note (Signed)
Preop diagnosis: left closed supracondylar distal femur fracture without intracondylar extension  Postop diagnosis : Left closed supracondylar distal femur fracture WITH intracondylar extension  Procedure: ORIF left closed supracondylar distal femur fracture with intracondylar extension. Biomet retrograde nail with interlocks proximal and distal. 6.5 cannulated screws  Surgeon: Rodell Perna M.D.  Anesthesia Gen. plus Marcaine local  EBL: Less than 150 mL  Procedure: After standard prepping and draping with general anesthesia Ancef prophylaxis timeout procedure 10:15 drape was placed up with the groin DuraPrep was used from the ankle past the groin. Split sheets drapes sterile skin marker Betadine Steri-Drape impervious stockinette Coban while applied.  Sterile mid thigh tourniquet was applied and tourniquet time was 11 minutes 350. Initial incision was made splitting the patellar tendon we'll and a retractor was placed. With palpation fingertip and draped visualization there is intracondylar extension which was not visualized on preoperative x-rays. Patient has osteopenia and is a dialysis patient. Smooth case wires were used under direct visualization to pin intracondylar fracture with finger reduction holding the condyles reduced. Next 6.5 lag screws were placed with a would not interfere with the planned retrograde nail. Biomet retrograde femoral nail was inserted without reaming due to her wide canal and a 360 mm nail was selected with all 4 distal interlocks applied. Nail was countersunk 1 mm. All distal screws lag the fracture and were bicortical. 26.5 lag screws have been placed tightening down the fragment 1 placed before the nail and the other after the nail. Both were bicortical did not catch the nail and tightened down securely. Due to the patient's osteopenia to proximal lag screws were placed locking bicortical 32 and 36 mm for rotational stability. Final spot pictures were taken copious  irrigation there was minimal blood loss. Split the patellar tendon was repaired with #1 Vicryl 2-0 Vicryl subtendinous tissue skin staple closure of all stab incisions for interlocks proximal and distal as well as the lag screws. Patient had a splint that was removed at the beginning the procedure at the end of the procedure the short leg splint was applied for the second third and fourth metatarsal fractures. Webb roll and Ace wrap applied over the knee and the knee immobilizer was reapplied. Patient tolerated the procedure well rotation looked good. There was slight extension at the distal fragment and patient had degenerative changes with pre-existing osteoarthritis in her knee.

## 2016-07-05 NOTE — Progress Notes (Signed)
Anesthesiology Note:  83 year old female with CAD S/P CABG 2008,  Type 2 DM ESRD on HD, mild dementia, ataxia and fall risk suffered L. distal femur fracture after fall at home. She then underwent ORIF of fracture with IM nail today by Dr. Lorin Mercy. The surgery and ansthesia were uneventful with 150 cc blood loss. In the recovery room, the patient developed tachycardia with HR 140-150. ECG showed sinus tach with PACs. She complained of intermittent chest pain. Labs were remarkable for H/H 7.1/21 K-3.7 glucose 159 and troponin I 0.03 ng/ml.  She was then given 2 units PRBCs and metoprolol 6 mg IV in divided doses with resolution of her chest pain and HR decreased to 100-110 range.   Impression: Chest pain and tachycardia following distal femur fracture reapir in elderly mildy demented patient with ESRD.  Plan  1. Appreciate Cardiology  (Dr. Wynonia Lawman) assistance 2. Transfer to monitored bed 3. Serial troponins   Roberts Gaudy

## 2016-07-05 NOTE — Interval H&P Note (Signed)
History and Physical Interval Note:  07/05/2016 7:15 AM  [name]  has presented today for surgery, with the diagnosis of left distal femur fracture  The various methods of treatment have been discussed with the patient and family. After consideration of risks, benefits and other options for treatment, the patient has consented to  Procedure(s): INTRAMEDULLARY (IM) RETROGRADE FEMORAL NAILING LEFT (Left) as a surgical intervention .  The patient's history has been reviewed, patient examined, no change in status, stable for surgery.  I have reviewed the patient's chart and labs.  Questions were answered to the patient's satisfaction.     Marybelle Killings

## 2016-07-06 DIAGNOSIS — S92335G Nondisplaced fracture of third metatarsal bone, left foot, subsequent encounter for fracture with delayed healing: Secondary | ICD-10-CM

## 2016-07-06 LAB — TYPE AND SCREEN
ABO/RH(D): B POS
ANTIBODY SCREEN: NEGATIVE
UNIT DIVISION: 0
UNIT DIVISION: 0

## 2016-07-06 LAB — BPAM RBC
BLOOD PRODUCT EXPIRATION DATE: 201805082359
BLOOD PRODUCT EXPIRATION DATE: 201805082359
ISSUE DATE / TIME: 201804071056
ISSUE DATE / TIME: 201804071056
UNIT TYPE AND RH: 7300
Unit Type and Rh: 7300

## 2016-07-06 LAB — CBC
HCT: 29.6 % — ABNORMAL LOW (ref 36.0–46.0)
HEMOGLOBIN: 9.5 g/dL — AB (ref 12.0–15.0)
MCH: 28.4 pg (ref 26.0–34.0)
MCHC: 32.1 g/dL (ref 30.0–36.0)
MCV: 88.6 fL (ref 78.0–100.0)
Platelets: 147 10*3/uL — ABNORMAL LOW (ref 150–400)
RBC: 3.34 MIL/uL — AB (ref 3.87–5.11)
RDW: 15.9 % — ABNORMAL HIGH (ref 11.5–15.5)
WBC: 12 10*3/uL — ABNORMAL HIGH (ref 4.0–10.5)

## 2016-07-06 LAB — RENAL FUNCTION PANEL
ALBUMIN: 2.8 g/dL — AB (ref 3.5–5.0)
ANION GAP: 14 (ref 5–15)
BUN: 24 mg/dL — ABNORMAL HIGH (ref 6–20)
CALCIUM: 8.2 mg/dL — AB (ref 8.9–10.3)
CO2: 27 mmol/L (ref 22–32)
Chloride: 94 mmol/L — ABNORMAL LOW (ref 101–111)
Creatinine, Ser: 4.38 mg/dL — ABNORMAL HIGH (ref 0.44–1.00)
GFR calc non Af Amer: 8 mL/min — ABNORMAL LOW (ref >60)
GFR, EST AFRICAN AMERICAN: 10 mL/min — AB (ref >60)
GLUCOSE: 133 mg/dL — AB (ref 65–99)
PHOSPHORUS: 3.6 mg/dL (ref 2.5–4.6)
POTASSIUM: 3.8 mmol/L (ref 3.5–5.1)
SODIUM: 135 mmol/L (ref 135–145)

## 2016-07-06 LAB — GLUCOSE, CAPILLARY
GLUCOSE-CAPILLARY: 145 mg/dL — AB (ref 65–99)
GLUCOSE-CAPILLARY: 168 mg/dL — AB (ref 65–99)
GLUCOSE-CAPILLARY: 222 mg/dL — AB (ref 65–99)
GLUCOSE-CAPILLARY: 189 mg/dL — AB (ref 65–99)
Glucose-Capillary: 209 mg/dL — ABNORMAL HIGH (ref 65–99)
Glucose-Capillary: 267 mg/dL — ABNORMAL HIGH (ref 65–99)

## 2016-07-06 MED ORDER — INSULIN GLARGINE 100 UNIT/ML ~~LOC~~ SOLN
5.0000 [IU] | Freq: Every day | SUBCUTANEOUS | Status: DC
  Administered 2016-07-06 – 2016-07-09 (×4): 5 [IU] via SUBCUTANEOUS
  Filled 2016-07-06 (×5): qty 0.05

## 2016-07-06 MED ORDER — CALCITRIOL 0.5 MCG PO CAPS
ORAL_CAPSULE | ORAL | Status: AC
  Filled 2016-07-06: qty 1

## 2016-07-06 MED ORDER — CARVEDILOL 6.25 MG PO TABS
6.2500 mg | ORAL_TABLET | Freq: Two times a day (BID) | ORAL | Status: DC
  Administered 2016-07-06 – 2016-07-10 (×6): 6.25 mg via ORAL
  Filled 2016-07-06 (×7): qty 1

## 2016-07-06 MED ORDER — METOPROLOL TARTRATE 5 MG/5ML IV SOLN
INTRAVENOUS | Status: AC
  Filled 2016-07-06: qty 5

## 2016-07-06 NOTE — Evaluation (Signed)
Physical Therapy Evaluation Patient Details Name: [name] MRN: [id number] DOB: March 31, 1931 Today's Date: 07/06/2016   History of Present Illness  Pt is an 83 yo female admitted through ED following a fall off a curb on 07/03/16 resulting in multiple fractures. Pt sustained a L 2nd metatarsal fx, R femur fx, L distal femur fx, and underwent an ORIF to L femur on 07/05/16. PMH significant for CAD with CABG x5 2008, dementia, DM, HLD, Neuropathy, HF, OA, ESRD on HD.   Clinical Impression  Pt is POD 1 and requires increased assistance this session due to fatigue and confusion. Prior to admission, pt was living alone a receiving assistance through the [facility]. Pt Is ESRD and on HD. Pt requires Max A for bed mobility and total assist for attempted transfers on RLE. Pt is having hallucinatory behaviors this session and reports the room spinning. Pt will require rehab placement at [facility] to improve mobility prior to discharge home. Pt will continue to benefit from acute PT services in order to continue to work on [place] mobility prior to discharge.     Follow Up Recommendations SNF;Supervision/Assistance - 24 hour    Equipment Recommendations  None recommended by PT    Recommendations for Other Services       Precautions / Restrictions Precautions Precautions: Fall Required Braces or Orthoses: Knee Immobilizer - Left Knee Immobilizer - Left: On at all times Restrictions Weight Bearing Restrictions: Yes RLE Weight Bearing: Weight bearing as tolerated (transfer only) LLE Weight Bearing: Non weight bearing      Mobility  Bed Mobility Overal bed mobility: Needs Assistance Bed Mobility: Supine to Sit;Sit to Supine     Supine to sit: +2 for physical assistance;HOB elevated;Max assist Sit to supine: Max assist;+2 for physical assistance   General bed mobility comments: +2 assistance to get EOB with use of pads to scoot pt to EOB  Transfers Overall transfer level: Needs  assistance Equipment used: None Transfers: Sit to/from Stand Sit to Stand: Total assist;From elevated surface;+2 physical assistance         General transfer comment: Unable to achieve standing, attempted x2 with pt being total assist.   Ambulation/Gait                Stairs            Wheelchair Mobility    Modified Rankin (Stroke Patients Only)       Balance Overall balance assessment: Needs assistance Sitting-balance support: Single extremity supported;Feet supported Sitting balance-Leahy Scale: Fair                                       Pertinent Vitals/Pain Pain Assessment: No/denies pain    Home Living Family/patient expects to be discharged to:: Skilled nursing facility Living Arrangements: Alone                    Prior Function Level of Independence: Independent with assistive device(s)         Comments: ambulatory with RW at baseline     Hand Dominance   Dominant Hand: Right    Extremity/Trunk Assessment   Upper Extremity Assessment Upper Extremity Assessment: Defer to OT evaluation    Lower Extremity Assessment Lower Extremity Assessment: RLE deficits/detail;LLE deficits/detail RLE Deficits / Details: No official MMT performed but at least 3/5 grossly.  LLE Deficits / Details: Pt with post op pain and weakness.  At least 3/5 ankle and 2/5 knee and hip per gross functional assessment LLE: Unable to fully assess due to immobilization       Communication   Communication: No difficulties  Cognition Arousal/Alertness: Awake/alert Behavior During Therapy: Flat affect Overall Cognitive Status: No family/caregiver present to determine baseline cognitive functioning Area of Impairment: Orientation;Following commands;Safety/judgement;Awareness;Problem solving                 Orientation Level: Disoriented to;Time     Following Commands: Follows one step commands inconsistently Safety/Judgement:  Decreased awareness of deficits   Problem Solving: Slow processing;Decreased initiation;Requires verbal cues General Comments: Pt reports seeing "roaches" in her room, thought her daughter was in the room and becomes very disoriented in sitting.       General Comments General comments (skin integrity, edema, etc.): pt becomes more confused in sitting. Cardiology assessed during treatment and pt is tachycardic at 122. BP is 158/55 in sitting. Once laying supine BP dropped to 118/56 with HR 109. O2 sats were 94%on RA supine.     Exercises     Assessment/Plan    PT Assessment Patient needs continued PT services  PT Problem List Decreased strength;Decreased activity tolerance;Decreased balance;Decreased mobility;Decreased coordination;Decreased cognition;Decreased knowledge of use of DME;Decreased safety awareness;Pain       PT Treatment Interventions Functional mobility training;Therapeutic activities;Therapeutic exercise;Balance training    PT Goals (Current goals can be found in the Care Plan section)  Acute Rehab PT Goals Patient Stated Goal: none stated PT Goal Formulation: With patient Time For Goal Achievement: 07/13/16 Potential to Achieve Goals: Fair    Frequency Min 3X/week   Barriers to discharge        Co-evaluation PT/OT/SLP Co-Evaluation/Treatment: Yes Reason for Co-Treatment: For patient/therapist safety;To address functional/ADL transfers;Complexity of the patient's impairments (multi-system involvement) PT goals addressed during session: Mobility/safety with mobility         End of Session Equipment Utilized During Treatment: Gait belt Activity Tolerance: Patient limited by lethargy Patient left: in bed;with call bell/phone within reach;with bed alarm set;with SCD's reapplied Nurse Communication: Mobility status PT Visit Diagnosis: Muscle weakness (generalized) (M62.81);History of falling (Z91.81);Difficulty in walking, not elsewhere classified (R26.2)     Time: 1135-1200 PT Time Calculation (min) (ACUTE ONLY): 25 min   Charges:   PT Evaluation $PT Eval Moderate Complexity: 1 Procedure     PT G Codes:       Scheryl Marten PT, DPT  [phone]   [name] 07/06/2016, 2:15 PM

## 2016-07-06 NOTE — Progress Notes (Signed)
Subjective:  Sitting up in bed.  Poor historian but no c/o chest pain or SOB.  Objective:  Vital Signs in the last 24 hours: BP (!) 154/45 (BP Location: Right Arm)   Pulse (!) 123   Temp 100 F (37.8 C) (Oral)   Resp 16   Ht 5\' 6"  (1.676 m)   Wt 76.4 kg (168 lb 6.9 oz)   SpO2 95%   BMI 27.19 kg/m   Physical Exam: Elderly BF in NAD Lungs:  Clear Cardiac:  Rapid regular rhythm, normal S1 and S2, no S3 Abdomen:  Soft, nontender, no masses Extremities:  Left leg in brace  Intake/Output from previous day: 04/07 0701 - 04/08 0700 In: 2425 [P.O.:420; I.V.:1000; Blood:1005] Out: 1100 [Blood:100]  Weight Filed Weights   07/03/16 1447 07/05/16 2212 07/06/16 0115  Weight: 74.8 kg (165 lb) 77.4 kg (170 lb 10.2 oz) 76.4 kg (168 lb 6.9 oz)    Lab Results: Basic Metabolic Panel:  Recent Labs  07/05/16 0551  07/05/16 1004 07/05/16 1802 07/06/16 0019  NA 135  < > 138  --  135  K 4.5  < > 3.7  --  3.8  CL 90*  --   --   --  94*  CO2 30  --   --   --  27  GLUCOSE 163*  < > 159*  --  133*  BUN 40*  --   --   --  24*  CREATININE 6.94*  --   --  7.61* 4.38*  < > = values in this interval not displayed. CBC:  Recent Labs  07/04/16 0944 07/05/16 0551  07/05/16 1802 07/06/16 0214  WBC 11.6* 13.3*  --  11.0* 12.0*  NEUTROABS 9.1* 10.0*  --   --   --   HGB 9.9* 7.6*  < > 9.6* 9.5*  HCT 31.2* 24.4*  < > 28.9* 29.6*  MCV 91.8 91.4  --  88.9 88.6  PLT 183 202  --  140* 147*  < > = values in this interval not displayed.  Recent Labs  07/03/16 2039 07/05/16 1115 07/05/16 1802  CKTOTAL  --  3,140*  --   CKMB  --  15.5*  --   TROPONINI <0.03 0.03* 0.06*  RELINDX  --  0.5  --     Telemetry: Sinus tachycardia  EKG Personally reviewed sinus tachycardia without acute change Assessment/Plan:  1.  Post op chest pain ? Due to anemia and tachycardia doubt cardiad 2. ESRD 3. SInus tachycardia  Rec:  Beta blocker .  OK to continue rehab.     Kerry Hough   MD [facility]  07/06/2016, 11:49 AM

## 2016-07-06 NOTE — Progress Notes (Signed)
PROGRESS NOTE    [name]  ZOX:[id number] DOB: May 06, 1930 DOA: 07/03/2016 PCP: Jani Gravel, MD    Brief Narrative:  [name] is an 83 y.o. female with hx of dementia, mild DM, ataxia and fall risks, ESRD on HD (T Th Sat), HLD, HTN, fell at home without loss of consciousness, presented to the ER with left leg pain.  Work up in the ER included an X ray of the femur showing comminuted distal femur Fx with some angulation, Xray of the left foot showed toe and metatarsal Fx, LS spine film showed compressive Fx of undetermined age, no Fx of the left pelvis, and CXR showed cardiomegaly and no infiltrate.  Her UA was negative, and lab work is still pending.  Orthopedics was consulted, and Dr Lorin Mercy planned to perform ORIF of the left femur, and foot did not require surgical intervention.  Hospitalist was asked to admit her for same.   Assessment & Plan:   Active Problems:   Nondisplaced fracture of second metatarsal bone, left foot, initial encounter for closed fracture   Closed displaced fracture of distal phalanx of left great toe   ESRD on hemodialysis ([place])   Displaced supracondylar fracture with intracondylar extension of lower end of right femur, initial encounter for closed fracture (HCC)   CAD (coronary artery disease), native coronary artery   Chest pain   Closed fracture of left distal femur (HCC)   Closed nondisplaced fracture of fourth metatarsal bone of left foot   Closed nondisplaced fracture of third metatarsal bone of left foot   Multifocal atrial tachycardia ([place])  #1 left distal femur fracture comminuted Secondary to mechanical fall. Patient has been seen by orthopedics and status post ORIF left closed supracondylar distal femur fracture with intracondylar extension per Dr. Lorin Mercy 07/05/2016.Pain management. Per orthopedics.  #2 left foot fracture Per orthopedics no surgical intervention at this time. Pain management. Supportive care. Per orthopedics.  #3 postop  chest pain/ MAT/tachycardia Postoperatively patient was noted in the recovery room 12 complaints of left-sided chest pain noted also to be tachycardic. Cardiology was consulted as patient with significant coronary artery disease history. Patient was seen in consultation by cardiology and at the time patient chest pain-free. Patient also noted to be anemic with a hemoglobin of 7.1 postprocedure. EKG done showed MAT. Patient given IV metoprolol with improvement with tachycardia. Cardiology recommended serial cardiac enzymes which were minimally elevated, 2-D echo to evaluate left ventricular ejection fraction and wall motion which is currently pending. TSH within normal limits at 1.703. Continue Coreg. Cardiology recommended beta blocker and aspirin if okay with orthopedics.  #3 end-stage renal disease on hemodialysis Patient has a seen in consultation by nephrology. Patient on Tuesday Thursday Saturday schedule. Per nephrology.   #4 diabetes mellitus Patient with CBGs in the 200s. Discontinue oral Amaryl. Place on Lantus 5 units daily. Sliding scale insulin.  #5 hyperlipidemia Continue statin.  #6 mild dementia Stable. Continue Aricept.  #7 postop acute blood loss anemia Patient s/p 2 units packed red blood cells in the PACU to maintain hemoglobin greater than 8-8.5 as patient noted with complaints of chest pain postoperatively and noted to be tachycardic. Hemoglobin currently at 9.5.  #8 postop fever Check blood cultures 2. Chest x-ray negative for any acute abnormalities.    DVT prophylaxis: SCDs Code Status: Full Family Communication: Updated patient. No family at bedside. Disposition Plan: Pending surgery and PT OT evaluation postoperatively and per orthopedics. Likely skilled nursing facility.   Consultants:  Orthopedics: Dr. Lorin Mercy 07/04/2016  Nephrology: Dr. Jonnie Finner 07/04/2016  Cardiology: Dr. Wynonia Lawman 07/05/2016  Procedures:  Plane. The left foot 07/03/2016  Plain films  of the left hip 03/04/2017  Principal's of the left knee 07/03/2016  Plain films of the L-spine 07/03/2016 at slight chest x-ray 07/03/2016  Plain films of the left ankle 07/03/2016  2 units packed red blood cells being transfused 07/05/2016  Antimicrobials:   None   Subjective: Patient sitting up in bed eating lunch. No chest. No shortness of breath. Patient is to have a MAXIMUM TEMPERATURE of 101 last night.  Objective: Vitals:   07/06/16 0100 07/06/16 0115 07/06/16 0415 07/06/16 0501  BP: (!) 105/47 (!) 108/41 (!) 109/42 (!) 154/45  Pulse: (!) 108 (!) 107 (!) 109 (!) 123  Resp: 17 18 16    Temp:  98.7 F (37.1 C) 98.9 F (37.2 C) 100 F (37.8 C)  TempSrc:  Oral Oral Oral  SpO2:  95% 100% 95%  Weight:  76.4 kg (168 lb 6.9 oz)    Height:        Intake/Output Summary (Last 24 hours) at 07/06/16 1326 Last data filed at 07/06/16 0115  Gross per 24 hour  Intake             1090 ml  Output             1000 ml  Net               90 ml   Filed Weights   07/03/16 1447 07/05/16 2212 07/06/16 0115  Weight: 74.8 kg (165 lb) 77.4 kg (170 lb 10.2 oz) 76.4 kg (168 lb 6.9 oz)    Examination:  General exam: Appears calm and comfortable,.  Respiratory system: Clear to auscultation anterior lung fields. Respiratory effort normal. Cardiovascular system: S1 & S2 heard, tachycardia. No JVD, murmurs, rubs, gallops or clicks. No pedal edema. Gastrointestinal system: Abdomen is nondistended, soft and nontender. No organomegaly or masses felt. Normal bowel sounds heard. Central nervous system: Alert and oriented. No focal neurological deficits. Extremities: Left lower extremity in splint and wrapped. Skin: No rashes, lesions or ulcers Psychiatry: Judgement and insight appear fair. Mood & affect appropriate.     Data Reviewed: I have personally reviewed following labs and imaging studies  CBC:  Recent Labs Lab 07/03/16 2039 07/04/16 0944 07/05/16 0551 07/05/16 0643  07/05/16 1004 07/05/16 1802 07/06/16 0214  WBC 11.0* 11.6* 13.3*  --   --  11.0* 12.0*  NEUTROABS 9.0* 9.1* 10.0*  --   --   --   --   HGB 10.5* 9.9* 7.6* 8.2* 7.1* 9.6* 9.5*  HCT 32.4* 31.2* 24.4* 24.0* 21.0* 28.9* 29.6*  MCV 90.3 91.8 91.4  --   --  88.9 88.6  PLT 185 183 202  --   --  140* 409*   Basic Metabolic Panel:  Recent Labs Lab 07/03/16 2039 07/04/16 0944 07/05/16 0551 07/05/16 0643 07/05/16 1004 07/05/16 1802 07/06/16 0019  NA 135 135 135 134* 138  --  135  K 3.7 3.9 4.5 4.5 3.7  --  3.8  CL 91* 92* 90*  --   --   --  94*  CO2 34* 28 30  --   --   --  27  GLUCOSE 201* 213* 163* 159* 159*  --  133*  BUN 16 21* 40*  --   --   --  24*  CREATININE 3.90* 4.88* 6.94*  --   --  7.61* 4.38*  CALCIUM 8.8* 8.8* 8.4*  --   --   --  8.2*  MG  --   --   --   --   --  1.9  --   PHOS  --  4.0 5.7*  --   --   --  3.6   GFR: Estimated Creatinine Clearance: 10.2 mL/min (A) (by C-G formula based on SCr of 4.38 mg/dL (H)). Liver Function Tests:  Recent Labs Lab 07/04/16 0944 07/05/16 0551 07/06/16 0019  ALBUMIN 3.1* 2.4* 2.8*   No results for input(s): LIPASE, AMYLASE in the last 168 hours. No results for input(s): AMMONIA in the last 168 hours. Coagulation Profile: No results for input(s): INR, PROTIME in the last 168 hours. Cardiac Enzymes:  Recent Labs Lab 07/03/16 2039 07/05/16 1115 07/05/16 1802  CKTOTAL  --  3,140*  --   CKMB  --  15.5*  --   TROPONINI <0.03 0.03* 0.06*   BNP (last 3 results) No results for input(s): PROBNP in the last 8760 hours. HbA1C: No results for input(s): HGBA1C in the last 72 hours. CBG:  Recent Labs Lab 07/05/16 2028 07/06/16 0207 07/06/16 0444 07/06/16 0844 07/06/16 1216  GLUCAP 169* 145* 168* 209* 222*   Lipid Profile: No results for input(s): CHOL, HDL, LDLCALC, TRIG, CHOLHDL, LDLDIRECT in the last 72 hours. Thyroid Function Tests:  Recent Labs  07/05/16 1802  TSH 1.703   Anemia Panel: No results for  input(s): VITAMINB12, FOLATE, FERRITIN, TIBC, IRON, RETICCTPCT in the last 72 hours. Sepsis Labs: No results for input(s): PROCALCITON, LATICACIDVEN in the last 168 hours.  Recent Results (from the past 240 hour(s))  Surgical PCR screen     Status: None   Collection Time: 07/04/16  3:18 AM  Result Value Ref Range Status   MRSA, PCR NEGATIVE NEGATIVE Final   Staphylococcus aureus NEGATIVE NEGATIVE Final    Comment:        The Xpert SA Assay (FDA approved for NASAL specimens in patients over 65 years of age), is one component of a comprehensive surveillance program.  Test performance has been validated by [facility] for patients greater than or equal to 41 year old. It is not intended to diagnose infection nor to guide or monitor treatment.          Radiology Studies: Dg Chest Port 1 View  Result Date: 07/05/2016 CLINICAL DATA:  Chest pain which has resolved but complaint of shakiness EXAM: PORTABLE CHEST 1 VIEW COMPARISON:  Two days ago FINDINGS: Chronic cardiomegaly. Status post CABG. Mildly low lung volumes with interstitial crowding. There is no edema, consolidation, effusion, or pneumothorax. Cholecystectomy clips. IMPRESSION: No evidence of active disease. Electronically Signed   By: Monte Fantasia M.D.   On: 07/05/2016 14:51   Dg C-arm 1-60 Min  Result Date: 07/05/2016 CLINICAL DATA:  Intramedullary nail. EXAM: DG C-ARM 61-120 MIN; LEFT FEMUR 2 VIEWS COMPARISON:  Radiography 07/03/2016 FINDINGS: Fluoroscopy for retrograde intramedullary femoral nail with multiple distal interlocking screws. Especially in the coronal plane, distal femoral metaphysis fracture has significantly improved alignment. No evidence of intraoperative fracture. IMPRESSION: Fluoroscopy for ORIF of distal femur fracture. No unexpected finding. Electronically Signed   By: Monte Fantasia M.D.   On: 07/05/2016 09:49   Dg Femur Min 2 Views Left  Result Date: 07/05/2016 CLINICAL DATA:  Intramedullary nail.  EXAM: DG C-ARM 61-120 MIN; LEFT FEMUR 2 VIEWS COMPARISON:  Radiography 07/03/2016 FINDINGS: Fluoroscopy for retrograde intramedullary femoral nail with multiple distal interlocking screws. Especially in the  coronal plane, distal femoral metaphysis fracture has significantly improved alignment. No evidence of intraoperative fracture. IMPRESSION: Fluoroscopy for ORIF of distal femur fracture. No unexpected finding. Electronically Signed   By: Monte Fantasia M.D.   On: 07/05/2016 09:49        Scheduled Meds: . acetaminophen  650 mg Oral Once  . calcitRIOL  0.5 mcg Oral Q T,Th,Sa-HD  . carvedilol  6.25 mg Oral BID WC  . donepezil  5 mg Oral QHS  . enoxaparin (LOVENOX) injection  30 mg Subcutaneous Q24H  . erythromycin  1 application Both Eyes QHS  . feeding supplement (NEPRO CARB STEADY)  237 mL Oral BID BM  . ferrous sulfate  325 mg Oral BID  . gabapentin  300 mg Oral Daily  . glimepiride  1 mg Oral Q breakfast  . insulin aspart  0-9 Units Subcutaneous Q4H  . ketoconazole  1 application Topical Daily  . multivitamin  1 tablet Oral QHS  . polyvinyl alcohol  1 drop Both Eyes BID  . rOPINIRole  1 mg Oral BID  . rosuvastatin  5 mg Oral QHS  . sevelamer carbonate  800 mg Oral TID WC   Continuous Infusions: . sodium chloride 10 mL/hr at 07/04/16 1747     LOS: 3 days    Time spent: 66 minutes    Bohdan Macho, MD [facility] Pager [phone]  If 7PM-7AM, please contact night-coverage [email or web address] Password [email or web address] 07/06/2016, 1:26 PM

## 2016-07-06 NOTE — Progress Notes (Signed)
   Subjective:  Patient reports pain as mild.  No events.  Objective:   VITALS:   Vitals:   07/06/16 0100 07/06/16 0115 07/06/16 0415 07/06/16 0501  BP: (!) 105/47 (!) 108/41 (!) 109/42 (!) 154/45  Pulse: (!) 108 (!) 107 (!) 109 (!) 123  Resp: 17 18 16    Temp:  98.7 F (37.1 C) 98.9 F (37.2 C) 100 F (37.8 C)  TempSrc:  Oral Oral Oral  SpO2:  95% 100% 95%  Weight:  76.4 kg (168 lb 6.9 oz)    Height:        Neurologically intact Neurovascular intact Sensation intact distally Intact pulses distally Dorsiflexion/Plantar flexion intact Incision: dressing C/D/I No cellulitis present Compartment soft   Lab Results  Component Value Date   WBC 12.0 (H) 07/06/2016   HGB 9.5 (L) 07/06/2016   HCT 29.6 (L) 07/06/2016   MCV 88.6 07/06/2016   PLT 147 (L) 07/06/2016     Assessment/Plan:  1 Day Post-Op   - Expected postop acute blood loss anemia - will monitor for symptoms - Up with PT/OT - DVT ppx - SCDs, ambulation, lovenox - NWB operative extremity - Pain control - Discharge planning - will need sNF  Eduard Roux 07/06/2016, 9:39 AM [phone]

## 2016-07-06 NOTE — Progress Notes (Signed)
Patient from HD to 5N.

## 2016-07-06 NOTE — Evaluation (Signed)
Occupational Therapy Evaluation Patient Details Name: [name] MRN: [id number] DOB: 1930-10-23 Today's Date: 07/06/2016    History of Present Illness Pt is an 83 yo female admitted through ED following a fall off a curb on 07/03/16 resulting in multiple fractures. Pt sustained a L 2nd metatarsal fx, R femur fx, L distal femur fx, and underwent an ORIF to L femur on 07/05/16. PMH significant for CAD with CABG x5 2008, dementia, DM, HLD, Neuropathy, HF, OA, ESRD on HD.    Clinical Impression   Pt reports she was independent with ADL PTA; pt is a poor historian and unsure of accuracy of information provided. Currently pt max-total assist for ADL and bed mobility; attempted sit to stand with total assist +2 but unable to clear pts bottom off bed. Pt presenting with generalized weakness, impaired cognition, poor sitting balance, decreased awareness of deficits, and poor activity tolerance impacting her independence and safety with ADL and functional mobility. Recommending SNF for follow up to maximize independence and safety with ADL and functional mobility upon d/c. Pt would benefit from continued skilled OT to address established goals.    Follow Up Recommendations  SNF;Supervision/Assistance - 24 hour    Equipment Recommendations  Other (comment) (TBD at next venue)    Recommendations for Other Services       Precautions / Restrictions Precautions Precautions: Fall Required Braces or Orthoses: Knee Immobilizer - Left Knee Immobilizer - Left: On at all times Restrictions Weight Bearing Restrictions: Yes RLE Weight Bearing: Weight bearing as tolerated (transfer only) LLE Weight Bearing: Non weight bearing      Mobility Bed Mobility Overal bed mobility: Needs Assistance Bed Mobility: Supine to Sit;Sit to Supine     Supine to sit: +2 for physical assistance;HOB elevated;Max assist Sit to supine: Max assist;+2 for physical assistance   General bed mobility comments: +2  assistance to get EOB with use of pads to scoot pt to EOB  Transfers Overall transfer level: Needs assistance Equipment used: None Transfers: Sit to/from Stand Sit to Stand: Total assist;From elevated surface;+2 physical assistance         General transfer comment: Unable to achieve standing, attempted x2 with pt being total assist.     Balance Overall balance assessment: Needs assistance Sitting-balance support: Single extremity supported;Feet supported Sitting balance-Leahy Scale: Fair                                     ADL either performed or assessed with clinical judgement   ADL Overall ADL's : Needs assistance/impaired                                       General ADL Comments: Pt currently total assist for ADL.     Vision         Perception     Praxis      Pertinent Vitals/Pain Pain Assessment: No/denies pain     Hand Dominance Right   Extremity/Trunk Assessment Upper Extremity Assessment Upper Extremity Assessment: Generalized weakness   Lower Extremity Assessment Lower Extremity Assessment: Defer to PT evaluation RLE Deficits / Details: No official MMT performed but at least 3/5 grossly.  LLE Deficits / Details: Pt with post op pain and weakness. At least 3/5 ankle and 2/5 knee and hip per gross functional assessment LLE: Unable to fully assess  due to immobilization   Cervical / Trunk Assessment Cervical / Trunk Assessment: Kyphotic   Communication Communication Communication: No difficulties   Cognition Arousal/Alertness: Awake/alert Behavior During Therapy: Flat affect Overall Cognitive Status: No family/caregiver present to determine baseline cognitive functioning Area of Impairment: Orientation;Following commands;Safety/judgement;Awareness;Problem solving                 Orientation Level: Disoriented to;Time     Following Commands: Follows one step commands inconsistently Safety/Judgement:  Decreased awareness of deficits   Problem Solving: Slow processing;Decreased initiation;Requires verbal cues General Comments: Pt reports seeing "roaches" in her room, thought her daughter was in the room and becomes very disoriented in sitting.    General Comments  pt becomes more confused in sitting. Cardiology assessed during treatment and pt is tachycardic at 122. BP is 158/55 in sitting. Once laying supine BP dropped to 118/56 with HR 109. O2 sats were 94%on RA supine.     Exercises     Shoulder Instructions      Home Living Family/patient expects to be discharged to:: Skilled nursing facility Living Arrangements: Alone                                      Prior Functioning/Environment Level of Independence: Independent with assistive device(s)        Comments: ambulatory with RW at baseline        OT Problem List: Decreased strength;Decreased range of motion;Decreased activity tolerance;Impaired balance (sitting and/or standing);Decreased cognition;Decreased safety awareness;Decreased knowledge of use of DME or AE;Decreased knowledge of precautions;Cardiopulmonary status limiting activity;Pain      OT Treatment/Interventions: Self-care/ADL training;Therapeutic exercise;Energy conservation;DME and/or AE instruction;Therapeutic activities;Patient/family education;Balance training    OT Goals(Current goals can be found in the care plan section) Acute Rehab OT Goals Patient Stated Goal: none stated OT Goal Formulation: With patient Time For Goal Achievement: 07/20/16 Potential to Achieve Goals: Fair ADL Goals Pt Will Perform Eating: with set-up;sitting Pt Will Perform Grooming: with set-up;with supervision;sitting Pt Will Transfer to Toilet: with max assist;stand pivot transfer;bedside commode Additional ADL Goal #1: Pt will perform bed mobility with mod assist as precursor to ADL.  OT Frequency: Min 2X/week   Barriers to D/C:             Co-evaluation PT/OT/SLP Co-Evaluation/Treatment: Yes Reason for Co-Treatment: Complexity of the patient's impairments (multi-system involvement);Necessary to address cognition/behavior during functional activity;For patient/therapist safety PT goals addressed during session: Mobility/safety with mobility OT goals addressed during session: ADL's and self-care      End of Session Equipment Utilized During Treatment: Gait belt;Left knee immobilizer Nurse Communication: Mobility status  Activity Tolerance: Patient tolerated treatment well Patient left: in bed;with call bell/phone within reach;with bed alarm set  OT Visit Diagnosis: Other abnormalities of gait and mobility (R26.89);Muscle weakness (generalized) (M62.81)                Time: 5681-2751 OT Time Calculation (min): 26 min Charges:  OT General Charges $OT Visit: 1 Procedure OT Evaluation $OT Eval Moderate Complexity: 1 Procedure G-Codes:     Malekai Markwood A. Ulice Brilliant, M.S., OTR/L Pager: [place] 07/06/2016, 3:19 PM

## 2016-07-06 NOTE — Progress Notes (Signed)
[facility] Progress Note   Subjective: did well w HD last night, slightly tachy during Rx but remained < 110 bpm.  Less jerking and more oriented this am.    Vitals:   07/06/16 0115 07/06/16 0415 07/06/16 0501 07/06/16 1722  BP: (!) 108/41 (!) 109/42 (!) 154/45 (!) 107/47  Pulse: (!) 107 (!) 109 (!) 123 (!) 105  Resp: 18 16    Temp: 98.7 F (37.1 C) 98.9 F (37.2 C) 100 F (37.8 C)   TempSrc: Oral Oral Oral   SpO2: 95% 100% 95% 91%  Weight: 76.4 kg (168 lb 6.9 oz)     Height:        Inpatient medications: . acetaminophen  650 mg Oral Once  . calcitRIOL  0.5 mcg Oral Q T,Th,Sa-HD  . carvedilol  6.25 mg Oral BID WC  . donepezil  5 mg Oral QHS  . enoxaparin (LOVENOX) injection  30 mg Subcutaneous Q24H  . erythromycin  1 application Both Eyes QHS  . feeding supplement (NEPRO CARB STEADY)  237 mL Oral BID BM  . ferrous sulfate  325 mg Oral BID  . gabapentin  300 mg Oral Daily  . glimepiride  1 mg Oral Q breakfast  . insulin aspart  0-9 Units Subcutaneous Q4H  . ketoconazole  1 application Topical Daily  . multivitamin  1 tablet Oral QHS  . polyvinyl alcohol  1 drop Both Eyes BID  . rOPINIRole  1 mg Oral BID  . rosuvastatin  5 mg Oral QHS  . sevelamer carbonate  800 mg Oral TID WC   . sodium chloride 10 mL/hr at 07/04/16 1747   sodium chloride, sodium chloride, acetaminophen, acetaminophen-codeine, heparin, HYDROcodone-acetaminophen, lidocaine (PF), lidocaine-prilocaine, loperamide, menthol-cetylpyridinium, morphine injection, morphine injection, morphine injection, ondansetron, pentafluoroprop-tetrafluoroeth  Exam: General: WDWN Elderly AAF NAD Head: NCAT sclera not icteric MMM Neck: Supple. No JVD  Lungs: CTA bilaterally without wheezes, rales, or rhonchi. Breathing is unlabored. Heart: RRR with S1 S2 Abdomen: soft NT + BS Lower extremities: L leg in ace wrap from thigh to foot no edema R LE Neuro: A & O  X 3. Moves all extremities  spontaneously. Psych:  Responds to questions appropriately with a normal affect. Dialysis Access: LUE AVF +thrill/bruit    Dialysis:  [facility] 3h 8min  2/2.25 bath  P4  73.5kg   Hep 4000  LUA AVF -Mircera 60 mcg IV q 2 weeks (last dose 4/3) - Calcitriol 0.5 mcg PO q HD  OP Labs: Hgb 11.5  Tsat 27% Ca 9.2  Alb 3.9 P 4.7 PTH 578 (03/2016)    Assessment/Plan: 1. Left distal femur fracture - sp ORIF 4/7 2. Chest pain - postop, transient , no further w/u per cards.   3. AMS - resolved, OX 3 now.  4. ESRD TTS HD 5. Vol - up 2-3kg, stable 6. Anemia  - Hgb 9.9 - follow on esa 7. Metabolic bone disease -  Ca/P ok  Cont VDRA/ No binders currently - follow renal panel  8. Nutrition - renal diet/vitamins/ protein supp for low albumin when diet resumes  9. DM - per primary  10. Dementia - mild    Plan - HD TTS   Kelly Splinter MD [facility] pager [phone]   07/06/2016, 5:53 PM    Recent Labs Lab 07/04/16 0944 07/05/16 0551 07/05/16 0643 07/05/16 1004 07/05/16 1802 07/06/16 0019  NA 135 135 134* 138  --  135  K 3.9 4.5 4.5 3.7  --  3.8  CL 92* 90*  --   --   --  94*  CO2 28 30  --   --   --  27  GLUCOSE 213* 163* 159* 159*  --  133*  BUN 21* 40*  --   --   --  24*  CREATININE 4.88* 6.94*  --   --  7.61* 4.38*  CALCIUM 8.8* 8.4*  --   --   --  8.2*  PHOS 4.0 5.7*  --   --   --  3.6    Recent Labs Lab 07/04/16 0944 07/05/16 0551 07/06/16 0019  ALBUMIN 3.1* 2.4* 2.8*    Recent Labs Lab 07/03/16 2039 07/04/16 0944 07/05/16 0551  07/05/16 1004 07/05/16 1802 07/06/16 0214  WBC 11.0* 11.6* 13.3*  --   --  11.0* 12.0*  NEUTROABS 9.0* 9.1* 10.0*  --   --   --   --   HGB 10.5* 9.9* 7.6*  < > 7.1* 9.6* 9.5*  HCT 32.4* 31.2* 24.4*  < > 21.0* 28.9* 29.6*  MCV 90.3 91.8 91.4  --   --  88.9 88.6  PLT 185 183 202  --   --  140* 147*  < > = values in this interval not displayed. Iron/TIBC/Ferritin/ %Sat    Component Value Date/Time   IRON 31  (L) 08/26/2013 0521   TIBC 242 (L) 08/26/2013 0521   FERRITIN 303 (H) 08/26/2013 0521   IRONPCTSAT 13 (L) 08/26/2013 4388

## 2016-07-07 ENCOUNTER — Inpatient Hospital Stay (HOSPITAL_COMMUNITY): Payer: Medicare (Managed Care)

## 2016-07-07 ENCOUNTER — Encounter (HOSPITAL_COMMUNITY): Payer: Self-pay | Admitting: Orthopaedic Surgery

## 2016-07-07 DIAGNOSIS — S92345K Nondisplaced fracture of fourth metatarsal bone, left foot, subsequent encounter for fracture with nonunion: Secondary | ICD-10-CM

## 2016-07-07 DIAGNOSIS — I2510 Atherosclerotic heart disease of native coronary artery without angina pectoris: Secondary | ICD-10-CM

## 2016-07-07 DIAGNOSIS — R079 Chest pain, unspecified: Secondary | ICD-10-CM

## 2016-07-07 DIAGNOSIS — I471 Supraventricular tachycardia: Secondary | ICD-10-CM

## 2016-07-07 DIAGNOSIS — R072 Precordial pain: Secondary | ICD-10-CM

## 2016-07-07 LAB — ECHOCARDIOGRAM COMPLETE
Height: 66 in
WEIGHTICAEL: 2694.9 [oz_av]

## 2016-07-07 LAB — RENAL FUNCTION PANEL
ANION GAP: 11 (ref 5–15)
Albumin: 2.1 g/dL — ABNORMAL LOW (ref 3.5–5.0)
BUN: 50 mg/dL — ABNORMAL HIGH (ref 6–20)
CALCIUM: 8 mg/dL — AB (ref 8.9–10.3)
CO2: 29 mmol/L (ref 22–32)
Chloride: 91 mmol/L — ABNORMAL LOW (ref 101–111)
Creatinine, Ser: 7.1 mg/dL — ABNORMAL HIGH (ref 0.44–1.00)
GFR calc non Af Amer: 5 mL/min — ABNORMAL LOW (ref >60)
GFR, EST AFRICAN AMERICAN: 5 mL/min — AB (ref >60)
Glucose, Bld: 169 mg/dL — ABNORMAL HIGH (ref 65–99)
PHOSPHORUS: 5.8 mg/dL — AB (ref 2.5–4.6)
POTASSIUM: 4.8 mmol/L (ref 3.5–5.1)
SODIUM: 131 mmol/L — AB (ref 135–145)

## 2016-07-07 LAB — CBC
HEMATOCRIT: 26 % — AB (ref 36.0–46.0)
HEMOGLOBIN: 8.7 g/dL — AB (ref 12.0–15.0)
MCH: 29.8 pg (ref 26.0–34.0)
MCHC: 33.5 g/dL (ref 30.0–36.0)
MCV: 89 fL (ref 78.0–100.0)
Platelets: 144 10*3/uL — ABNORMAL LOW (ref 150–400)
RBC: 2.92 MIL/uL — AB (ref 3.87–5.11)
RDW: 16 % — ABNORMAL HIGH (ref 11.5–15.5)
WBC: 9.8 10*3/uL (ref 4.0–10.5)

## 2016-07-07 LAB — GLUCOSE, CAPILLARY
GLUCOSE-CAPILLARY: 157 mg/dL — AB (ref 65–99)
GLUCOSE-CAPILLARY: 179 mg/dL — AB (ref 65–99)
GLUCOSE-CAPILLARY: 189 mg/dL — AB (ref 65–99)
GLUCOSE-CAPILLARY: 221 mg/dL — AB (ref 65–99)
Glucose-Capillary: 133 mg/dL — ABNORMAL HIGH (ref 65–99)
Glucose-Capillary: 170 mg/dL — ABNORMAL HIGH (ref 65–99)

## 2016-07-07 MED ORDER — HYDROCODONE-ACETAMINOPHEN 5-325 MG PO TABS
1.0000 | ORAL_TABLET | Freq: Four times a day (QID) | ORAL | Status: DC | PRN
  Administered 2016-07-07 – 2016-07-08 (×3): 1 via ORAL
  Filled 2016-07-07 (×3): qty 1

## 2016-07-07 MED ORDER — ACETAMINOPHEN 500 MG PO TABS
500.0000 mg | ORAL_TABLET | Freq: Three times a day (TID) | ORAL | Status: DC
  Administered 2016-07-07 – 2016-07-10 (×7): 500 mg via ORAL
  Filled 2016-07-07 (×7): qty 1

## 2016-07-07 MED ORDER — DARBEPOETIN ALFA 100 MCG/0.5ML IJ SOSY
100.0000 ug | PREFILLED_SYRINGE | INTRAMUSCULAR | Status: DC
  Administered 2016-07-08: 100 ug via INTRAVENOUS

## 2016-07-07 NOTE — Progress Notes (Signed)
  Echocardiogram 2D Echocardiogram has been performed.  Dolora Ridgely T Daniyah Fohl 07/07/2016, 2:50 PM

## 2016-07-07 NOTE — Progress Notes (Signed)
PT Cancellation Note  Patient Details Name: [name] MRN: [id number] DOB: 12/20/1930   Cancelled Treatment:    Reason Eval/Treat Not Completed: Patient at procedure or test/unavailable Pt off unit for test. PT will check on pt later as time allows.    [place]ina April, PTA Pager: [phone]   07/07/2016, 1:50 PM

## 2016-07-07 NOTE — [place] FL2 (Signed)
[place] MEDICAID FL2 LEVEL OF CARE SCREENING TOOL     IDENTIFICATION  Patient Name: [name] Birthdate: December 10, 1930 Sex: female Admission Date (Current Location): 07/03/2016  [facility] and [place] Number:  Herbalist and Address:  [facility] [place], [place] [address] [place], [place] [address]      Provider Number: [id number]  Attending Physician Name and Address:  Eugenie Filler, MD  Relative Name and Phone Number:       Current Level of Care: Hospital Recommended Level of Care: [place] Prior Approval Number:    Date Approved/Denied:   PASRR Number:  [id number]   Discharge Plan: SNF    Current Diagnoses: Patient Active Problem List   Diagnosis Date Noted  . Displaced supracondylar fracture with intracondylar extension of lower end of right femur, initial encounter for closed fracture ([place]) 07/05/2016  . CAD (coronary artery disease), native coronary artery 07/05/2016  . Chest pain   . Closed fracture of left distal femur ([place])   . Closed nondisplaced fracture of fourth metatarsal bone of left foot   . Closed nondisplaced fracture of third metatarsal bone of left foot   . Multifocal atrial tachycardia (HCC)   . Nondisplaced fracture of second metatarsal bone, left foot, initial encounter for closed fracture   . Closed displaced fracture of distal phalanx of left great toe   . ESRD on hemodialysis ([place])   . Chronic diastolic heart failure ([place]) 05/23/2014  . Type 2 diabetes mellitus with renal manifestations ([place]) 02/01/2014  . Hypertensive heart disease without CHF 11/10/2013  . Dementia 08/24/2013  . Frequent falls 08/24/2013  . Osteoarthritis 01/01/2013  . Hyperlipidemia 01/01/2013  . Hx of CABG 01/01/2013  . Anemia of chronic renal failure 10/05/2010    Orientation RESPIRATION BLADDER Height & Weight     Self, Situation, Place  Normal Continent Weight: 168 lb 6.9 oz (76.4 kg) Height:  5\' 6"  (167.6 cm)   BEHAVIORAL SYMPTOMS/MOOD NEUROLOGICAL BOWEL NUTRITION STATUS      Continent Diet (See DC Summary)  AMBULATORY STATUS COMMUNICATION OF NEEDS Skin   Extensive Assist Verbally Surgical wounds (left leg incision, ABD, compression wrap; left chest incision)                       Personal Care Assistance Level of Assistance  Bathing, Feeding Bathing Assistance: Maximum assistance Feeding assistance: Limited assistance Dressing Assistance: Maximum assistance     Functional Limitations Info  Sight, Hearing, Speech Sight Info: Adequate Hearing Info: Adequate Speech Info: Adequate    SPECIAL CARE FACTORS FREQUENCY  PT (By licensed PT), OT (By licensed OT)     PT Frequency: 5/week OT Frequency: 5/week            Contractures Contractures Info: Not present    Additional Factors Info  Code Status, Allergies, Insulin Sliding Scale Code Status Info: FULL Allergies Info: NKA   Insulin Sliding Scale Info: 9 units every 4 hr; lantus 5 units at bed       Current Medications (07/07/2016):  This is the current hospital active medication list Current Facility-Administered Medications  Medication Dose Route Frequency Provider Last Rate Last Dose  . 0.9 %  sodium chloride infusion  100 mL Intravenous PRN Thomos Lemons Ejigiri, PA-C      . 0.9 %  sodium chloride infusion  100 mL Intravenous PRN Thomos Lemons Ejigiri, PA-C      . 0.9 %  sodium chloride infusion  Intravenous Continuous Josephine Igo, MD 10 mL/hr at 07/04/16 1747    . acetaminophen (TYLENOL) tablet 650 mg  650 mg Oral Q6H PRN Alexis Hugelmeyer, DO   650 mg at 07/06/16 0455  . acetaminophen (TYLENOL) tablet 650 mg  650 mg Oral Once [employer], DO   Stopped at 07/06/16 0200  . acetaminophen-codeine (TYLENOL #3) 300-30 MG per tablet 1 tablet  1 tablet Oral Q6H PRN Orvan Falconer, MD   1 tablet at 07/04/16 1023  . calcitRIOL (ROCALTROL) capsule 0.5 mcg  0.5 mcg Oral Q T,Th,Sa-HD Lynnda Child, PA-C   0.5 mcg at  07/06/16 0020  . carvedilol (COREG) tablet 6.25 mg  6.25 mg Oral BID WC Jacolyn Reedy, MD   6.25 mg at 07/07/16 1034  . [START ON 07/08/2016] Darbepoetin Alfa (ARANESP) injection 100 mcg  100 mcg Intravenous Q Tue-HD Fleet Contras, MD      . donepezil (ARICEPT) tablet 5 mg  5 mg Oral QHS Orvan Falconer, MD   5 mg at 07/06/16 2141  . enoxaparin (LOVENOX) injection 30 mg  30 mg Subcutaneous Q24H Marybelle Killings, MD   30 mg at 07/07/16 1034  . erythromycin ophthalmic ointment 1 application  1 application Both Eyes QHS Orvan Falconer, MD   1 application at 25/95/63 2142  . feeding supplement (NEPRO CARB STEADY) liquid 237 mL  237 mL Oral BID BM Thomos Lemons Ejigiri, PA-C   237 mL at 07/07/16 1043  . ferrous sulfate tablet 325 mg  325 mg Oral BID Orvan Falconer, MD   325 mg at 07/07/16 1036  . gabapentin (NEURONTIN) capsule 300 mg  300 mg Oral Daily Irine Seal V, MD   300 mg at 07/07/16 1037  . heparin injection 1,000 Units  1,000 Units Dialysis PRN Thomos Lemons Ejigiri, PA-C      . HYDROcodone-acetaminophen (NORCO/VICODIN) 5-325 MG per tablet 1 tablet  1 tablet Oral Q6H PRN Marybelle Killings, MD   1 tablet at 07/07/16 1101  . insulin aspart (novoLOG) injection 0-9 Units  0-9 Units Subcutaneous Q4H Orvan Falconer, MD   2 Units at 07/07/16 0416  . insulin glargine (LANTUS) injection 5 Units  5 Units Subcutaneous QHS Eugenie Filler, MD   5 Units at 07/06/16 2143  . ketoconazole (NIZORAL) 2 % cream 1 application  1 application Topical Daily Marybelle Killings, MD   1 application at 87/56/43 1037  . lidocaine (PF) (XYLOCAINE) 1 % injection 5 mL  5 mL Intradermal PRN Lynnda Child, PA-C      . lidocaine-prilocaine (EMLA) cream 1 application  1 application Topical PRN Lynnda Child, PA-C      . loperamide (IMODIUM) capsule 2 mg  2 mg Oral QID PRN Marybelle Killings, MD      . menthol-cetylpyridinium (CEPACOL) lozenge 3 mg  1 lozenge Oral PRN Marybelle Killings, MD      . multivitamin (RENA-VIT) tablet 1 tablet  1 tablet Oral QHS  Lynnda Child, PA-C   1 tablet at 07/06/16 2143  . ondansetron (ZOFRAN) tablet 4 mg  4 mg Oral Q8H PRN Marybelle Killings, MD      . pentafluoroprop-tetrafluoroeth [facility]) aerosol 1 application  1 application Topical PRN Thomos Lemons Ejigiri, PA-C      . polyvinyl alcohol (LIQUIFILM TEARS) 1.4 % ophthalmic solution 1 drop  1 drop Both Eyes BID Orvan Falconer, MD   1 drop at 07/07/16 1037  . rOPINIRole (REQUIP) tablet 1 mg  1  mg Oral BID Marybelle Killings, MD   1 mg at 07/07/16 1037  . rosuvastatin (CRESTOR) tablet 5 mg  5 mg Oral QHS Orvan Falconer, MD   5 mg at 07/06/16 2144  . sevelamer carbonate (RENVELA) tablet 800 mg  800 mg Oral TID WC Orvan Falconer, MD   800 mg at 07/07/16 1037     Discharge Medications: Please see discharge summary for a list of discharge medications.  Relevant Imaging Results:  Relevant Lab Results:   Additional Information SS: [id number] ; Dialysis T, Th, Sat  Kelso Bibby A Daphanie Oquendo, LCSW

## 2016-07-07 NOTE — Progress Notes (Addendum)
   Subjective: 2 Days Post-Op Procedure(s) (LRB): INTRAMEDULLARY (IM) RETROGRADE FEMORAL NAILING LEFT (Left) Patient reports pain as mild.  Falls asleep when I stop talking to her.   Objective: Vital signs in last 24 hours: Temp:  [100 F (37.8 C)-100.5 F (38.1 C)] 100 F (37.8 C) (04/09 0358) Pulse Rate:  [92-105] 92 (04/09 0358) Resp:  [16] 16 (04/09 0358) BP: (97-107)/(41-47) 97/41 (04/09 0358) SpO2:  [91 %-92 %] 92 % (04/09 0358)  Intake/Output from previous day: 04/08 0701 - 04/09 0700 In: 580 [P.O.:580] Out: -  Intake/Output this shift: Total I/O In: 120 [P.O.:120] Out: -    Recent Labs  07/05/16 0643 07/05/16 1004 07/05/16 1802 07/06/16 0214 07/07/16 0307  HGB 8.2* 7.1* 9.6* 9.5* 8.7*    Recent Labs  07/06/16 0214 07/07/16 0307  WBC 12.0* 9.8  RBC 3.34* 2.92*  HCT 29.6* 26.0*  PLT 147* 144*    Recent Labs  07/06/16 0019 07/07/16 0307  NA 135 131*  K 3.8 4.8  CL 94* 91*  CO2 27 29  BUN 24* 50*  CREATININE 4.38* 7.10*  GLUCOSE 133* 169*  CALCIUM 8.2* 8.0*   No results for input(s): LABPT, INR in the last 72 hours.  dressing changed right thigh. betadine swab Mephilex dressing applied. all incisions look good.  No results found.  Assessment/Plan: 2 Days Post-Op Procedure(s) (LRB): INTRAMEDULLARY (IM) RETROGRADE FEMORAL NAILING LEFT (Left) Plan:  Stop morphine. Decrease norco to 1 tablet instead of 2.  Knee immobilizer discontinued only when therapy to do gentle partial knee flexion then reapply.   Short leg splint remains for metatarsal fractures with fluid changes and dialysis will wait a few days to apply short leg cast.  Begin knee ROM with therapy.  SNF placement.   Marybelle Killings 07/07/2016, 9:51 AM

## 2016-07-07 NOTE — Clinical Social Work Placement (Signed)
   CLINICAL SOCIAL WORK PLACEMENT  NOTE  Date:  07/07/2016  Patient Details  Name: [name] MRN: [id number] Date of Birth: 1931-03-16  Clinical Social Work is seeking post-discharge placement for this patient at the [place] level of care (*CSW will initial, date and re-position this form in  chart as items are completed):  Yes   Patient/family provided with [place] Work Department's list of facilities offering this level of care within the geographic area requested by the patient (or if unable, by the patient's family).  Yes   Patient/family informed of their freedom to choose among providers that offer the needed level of care, that participate in Medicare, Medicaid or managed care program needed by the patient, have an available bed and are willing to accept the patient.  Yes   Patient/family informed of [facility]'s ownership interest in [facility] and [facility], as well as of the fact that they are under no obligation to receive care at these facilities.  PASRR submitted to EDS on       PASRR number received on 07/07/16     Existing PASRR number confirmed on       FL2 transmitted to all facilities in geographic area requested by pt/family on 07/07/16     FL2 transmitted to all facilities within larger geographic area on 07/07/16     Patient informed that his/her managed care company has contracts with or will negotiate with certain facilities, including the following:        No   Patient/family informed of bed offers received.  Patient chooses bed at       Physician recommends and patient chooses bed at      Patient to be transferred to   on  .  Patient to be transferred to facility by       Patient family notified on   of transfer.  Name of family member notified:        PHYSICIAN Please prepare priority discharge summary, including medications     Additional Comment:     _______________________________________________ Normajean Baxter, LCSW 07/07/2016, 4:22 PM

## 2016-07-07 NOTE — Progress Notes (Signed)
PROGRESS NOTE    [name]  MEQ:[id number] DOB: 1931-03-05 DOA: 07/03/2016 PCP: Jani Gravel, MD    Brief Narrative:  [name] is an 83 y.o. female with hx of dementia, mild DM, ataxia and fall risks, ESRD on HD (T Th Sat), HLD, HTN, fell at home without loss of consciousness, presented to the ER with left leg pain.  Work up in the ER included an X ray of the femur showing comminuted distal femur Fx with some angulation, Xray of the left foot showed toe and metatarsal Fx, LS spine film showed compressive Fx of undetermined age, no Fx of the left pelvis, and CXR showed cardiomegaly and no infiltrate.  Her UA was negative, and lab work is still pending.  Orthopedics was consulted, and Dr Lorin Mercy planned to perform ORIF of the left femur, and foot did not require surgical intervention.  Hospitalist was asked to admit her for same.   Assessment & Plan:   Active Problems:   Nondisplaced fracture of second metatarsal bone, left foot, initial encounter for closed fracture   Closed displaced fracture of distal phalanx of left great toe   ESRD on hemodialysis ([place])   Displaced supracondylar fracture with intracondylar extension of lower end of right femur, initial encounter for closed fracture (HCC)   CAD (coronary artery disease), native coronary artery   Chest pain   Closed fracture of left distal femur (HCC)   Closed nondisplaced fracture of fourth metatarsal bone of left foot   Closed nondisplaced fracture of third metatarsal bone of left foot   Multifocal atrial tachycardia ([place])  #1 left distal femur fracture comminuted Secondary to mechanical fall. Patient has been seen by orthopedics and status post ORIF left closed supracondylar distal femur fracture with intracondylar extension per Dr. Lorin Mercy 07/05/2016.Pain management. Per orthopedics.  #2 left foot fracture Per orthopedics no surgical intervention at this time. Pain management. Supportive care. Per orthopedics.  #3 postop  chest pain/ MAT/tachycardia Postoperatively patient was noted in the recovery room 12 complaints of left-sided chest pain noted also to be tachycardic. Cardiology was consulted as patient with significant coronary artery disease history. Patient was seen in consultation by cardiology and at the time patient chest pain-free. Patient also noted to be anemic with a hemoglobin of 7.1 postprocedure. EKG done showed MAT. Patient given IV metoprolol with improvement with tachycardia. Cardiology recommended serial cardiac enzymes which were minimally elevated, 2-D echo to evaluate left ventricular ejection fraction and wall motion which is currently pending. TSH within normal limits at 1.703. Continue Coreg. Cardiology recommended beta blocker and aspirin if okay with orthopedics.  #3 end-stage renal disease on hemodialysis Patient has a seen in consultation by nephrology. Patient on Tuesday Thursday Saturday schedule. Per nephrology.   #4 diabetes mellitus Patient with CBGs in the 133-221. Discontinued oral Amaryl. Continue Lantus 5 units daily. Sliding scale insulin.  #5 hyperlipidemia Continue statin.  #6 mild dementia Stable. Continue Aricept.  #7 postop acute blood loss anemia Patient s/p 2 units packed red blood cells in the PACU to maintain hemoglobin greater than 8-8.5 as patient noted with complaints of chest pain postoperatively and noted to be tachycardic. Hemoglobin currently at 8.7.  #8 postop fever Blood cultures with no growth to date. Chest x-ray negative for any acute abnormalities.    DVT prophylaxis: SCDs Code Status: Full Family Communication: Updated patient and family at bedside. Disposition Plan: Pending surgery and PT OT evaluation postoperatively and per orthopedics. Likely skilled nursing facility.   Consultants:  Orthopedics: Dr. Lorin Mercy 07/04/2016  Nephrology: Dr. Jonnie Finner 07/04/2016  Cardiology: Dr. Wynonia Lawman 07/05/2016  Procedures:  Plane. The left foot  07/03/2016  Plain films of the left hip 03/04/2017  Principal's of the left knee 07/03/2016  Plain films of the L-spine 07/03/2016 at slight chest x-ray 07/03/2016  Plain films of the left ankle 07/03/2016  2 units packed red blood cells being transfused 07/05/2016  2-D echo pending 07/07/2016  Antimicrobials:   None   Subjective: Patient in bed no complaints. No chest pain. No shortness of breath. MAXIMUM TEMPERATURE is 100.5. Patient complaining of left lower extremity pain.  Objective: Vitals:   07/06/16 0501 07/06/16 1722 07/06/16 2210 07/07/16 0358  BP: (!) 154/45 (!) 107/47 (!) 100/41 (!) 97/41  Pulse: (!) 123 (!) 105 93 92  Resp:   16 16  Temp: 100 F (37.8 C)  (!) 100.5 F (38.1 C) 100 F (37.8 C)  TempSrc: Oral  Oral Oral  SpO2: 95% 91% 92% 92%  Weight:      Height:        Intake/Output Summary (Last 24 hours) at 07/07/16 1347 Last data filed at 07/07/16 0821  Gross per 24 hour  Intake              460 ml  Output                0 ml  Net              460 ml   Filed Weights   07/03/16 1447 07/05/16 2212 07/06/16 0115  Weight: 74.8 kg (165 lb) 77.4 kg (170 lb 10.2 oz) 76.4 kg (168 lb 6.9 oz)    Examination:  General exam: Appears calm and comfortable,.  Respiratory system: Clear to auscultation anterior lung fields. Respiratory effort normal. Cardiovascular system: S1 & S2 heard, tachycardia. No JVD, murmurs, rubs, gallops or clicks. No pedal edema. Gastrointestinal system: Abdomen is nondistended, soft and nontender. No organomegaly or masses felt. Normal bowel sounds heard. Central nervous system: Alert and oriented. No focal neurological deficits. Extremities: Left lower extremity in splint and wrapped. Skin: No rashes, lesions or ulcers Psychiatry: Judgement and insight appear fair. Mood & affect appropriate.     Data Reviewed: I have personally reviewed following labs and imaging studies  CBC:  Recent Labs Lab 07/03/16 2039  07/04/16 0944 07/05/16 0551 07/05/16 0643 07/05/16 1004 07/05/16 1802 07/06/16 0214 07/07/16 0307  WBC 11.0* 11.6* 13.3*  --   --  11.0* 12.0* 9.8  NEUTROABS 9.0* 9.1* 10.0*  --   --   --   --   --   HGB 10.5* 9.9* 7.6* 8.2* 7.1* 9.6* 9.5* 8.7*  HCT 32.4* 31.2* 24.4* 24.0* 21.0* 28.9* 29.6* 26.0*  MCV 90.3 91.8 91.4  --   --  88.9 88.6 89.0  PLT 185 183 202  --   --  140* 147* 160*   Basic Metabolic Panel:  Recent Labs Lab 07/03/16 2039 07/04/16 0944 07/05/16 0551 07/05/16 0643 07/05/16 1004 07/05/16 1802 07/06/16 0019 07/07/16 0307  NA 135 135 135 134* 138  --  135 131*  K 3.7 3.9 4.5 4.5 3.7  --  3.8 4.8  CL 91* 92* 90*  --   --   --  94* 91*  CO2 34* 28 30  --   --   --  27 29  GLUCOSE 201* 213* 163* 159* 159*  --  133* 169*  BUN 16 21* 40*  --   --   --  24* 50*  CREATININE 3.90* 4.88* 6.94*  --   --  7.61* 4.38* 7.10*  CALCIUM 8.8* 8.8* 8.4*  --   --   --  8.2* 8.0*  MG  --   --   --   --   --  1.9  --   --   PHOS  --  4.0 5.7*  --   --   --  3.6 5.8*   GFR: Estimated Creatinine Clearance: 6.3 mL/min (A) (by C-G formula based on SCr of 7.1 mg/dL (H)). Liver Function Tests:  Recent Labs Lab 07/04/16 0944 07/05/16 0551 07/06/16 0019 07/07/16 0307  ALBUMIN 3.1* 2.4* 2.8* 2.1*   No results for input(s): LIPASE, AMYLASE in the last 168 hours. No results for input(s): AMMONIA in the last 168 hours. Coagulation Profile: No results for input(s): INR, PROTIME in the last 168 hours. Cardiac Enzymes:  Recent Labs Lab 07/03/16 2039 07/05/16 1115 07/05/16 1802  CKTOTAL  --  3,140*  --   CKMB  --  15.5*  --   TROPONINI <0.03 0.03* 0.06*   BNP (last 3 results) No results for input(s): PROBNP in the last 8760 hours. HbA1C: No results for input(s): HGBA1C in the last 72 hours. CBG:  Recent Labs Lab 07/06/16 2003 07/07/16 0027 07/07/16 0401 07/07/16 0746 07/07/16 1155  GLUCAP 189* 189* 157* 133* 179*   Lipid Profile: No results for input(s): CHOL,  HDL, LDLCALC, TRIG, CHOLHDL, LDLDIRECT in the last 72 hours. Thyroid Function Tests:  Recent Labs  07/05/16 1802  TSH 1.703   Anemia Panel: No results for input(s): VITAMINB12, FOLATE, FERRITIN, TIBC, IRON, RETICCTPCT in the last 72 hours. Sepsis Labs: No results for input(s): PROCALCITON, LATICACIDVEN in the last 168 hours.  Recent Results (from the past 240 hour(s))  Surgical PCR screen     Status: None   Collection Time: 07/04/16  3:18 AM  Result Value Ref Range Status   MRSA, PCR NEGATIVE NEGATIVE Final   Staphylococcus aureus NEGATIVE NEGATIVE Final    Comment:        The Xpert SA Assay (FDA approved for NASAL specimens in patients over 58 years of age), is one component of a comprehensive surveillance program.  Test performance has been validated by [facility] for patients greater than or equal to 79 year old. It is not intended to diagnose infection nor to guide or monitor treatment.   Culture, blood (Routine X 2) w Reflex to ID Panel     Status: None (Preliminary result)   Collection Time: 07/06/16  9:22 AM  Result Value Ref Range Status   Specimen Description BLOOD RIGHT HAND  Final   Special Requests IN PEDIATRIC BOTTLE Blood Culture adequate volume  Final   Culture NO GROWTH < 12 HOURS  Final   Report Status PENDING  Incomplete  Culture, blood (Routine X 2) w Reflex to ID Panel     Status: None (Preliminary result)   Collection Time: 07/06/16  9:32 AM  Result Value Ref Range Status   Specimen Description BLOOD RIGHT HAND  Final   Special Requests IN PEDIATRIC BOTTLE Blood Culture adequate volume  Final   Culture NO GROWTH < 12 HOURS  Final   Report Status PENDING  Incomplete         Radiology Studies: No results found.      Scheduled Meds: . acetaminophen  650 mg Oral Once  . calcitRIOL  0.5 mcg Oral Q T,Th,Sa-HD  . carvedilol  6.25 mg Oral BID  WC  . [START ON 07/08/2016] darbepoetin (ARANESP) injection - DIALYSIS  100 mcg Intravenous Q Tue-HD   . donepezil  5 mg Oral QHS  . enoxaparin (LOVENOX) injection  30 mg Subcutaneous Q24H  . erythromycin  1 application Both Eyes QHS  . feeding supplement (NEPRO CARB STEADY)  237 mL Oral BID BM  . ferrous sulfate  325 mg Oral BID  . gabapentin  300 mg Oral Daily  . insulin aspart  0-9 Units Subcutaneous Q4H  . insulin glargine  5 Units Subcutaneous QHS  . ketoconazole  1 application Topical Daily  . multivitamin  1 tablet Oral QHS  . polyvinyl alcohol  1 drop Both Eyes BID  . rOPINIRole  1 mg Oral BID  . rosuvastatin  5 mg Oral QHS  . sevelamer carbonate  800 mg Oral TID WC   Continuous Infusions: . sodium chloride 10 mL/hr at 07/04/16 1747     LOS: 4 days    Time spent: 19 minutes    THOMPSON,DANIEL, MD [facility] Pager [phone]  If 7PM-7AM, please contact night-coverage [email or web address] Password [email or web address] 07/07/2016, 1:47 PM

## 2016-07-07 NOTE — Clinical Social Work Note (Addendum)
Clinical Social Work Assessment  Patient Details  Name: [name] MRN: [id number] Date of Birth: 12-07-1930  Date of referral:  07/07/16               Reason for consult:  Facility Placement                Permission sought to share information with:  Chartered certified accountant granted to share information::  Yes, Verbal Permission Granted  Name::     Barista::  SNF  Relationship::  Nephew  Contact Information:     Housing/Transportation Living arrangements for the past 2 months:  Apartment Source of Information:   Clinical cytogeneticist) Patient Interpreter Needed:  None Criminal Activity/Legal Involvement Pertinent to Current Situation/Hospitalization:  No - Comment as needed Significant Relationships:  Other Family Members, Friend Lives with:  Self Do you feel safe going back to the place where you live?  No Need for family participation in patient care:  Yes (Comment)  Care giving concerns:  Patient resides alone prior to hospitalization. Nephew is concerned about her care at home and her ability to manage all her health needs independently.  Social Worker assessment / plan:  CSW met with nephew today who was in patients room.  Nephew indicated that he was patient's HCPOA.  He indicated that patient was agreeable with SNF placement and has minimal supports at home.   Employment status:  Retired Surveyor, [employer] Care PT Recommendations:  [place] / Referral to community resources:  [place]  Patient/Family's Response to care:  Patient and patient's nephew agreeable to SNF at DC.  Patient's nephew primarily prefers [facility] however is aware that since she owes a balance from a prior stay she may not be able to return at this time.  Patient's nephew agreeable to [facility] as a second placement choice. Patient's agreeable to general [facility] fax out.  Patient and patient's nephew denies any  concerns regarding patient care at this time.  Patient/Family's Understanding of and Emotional Response to Diagnosis, Current Treatment, and Prognosis:  Patient and patient's nephew understand SNF placement and agreeable with SNF placement at DC.   Patient and patient's family responded emotionally appropriate during initial assessment.  Patient and patient's nephew denies any concerns regarding treatment plan at this time  Emotional Assessment Appearance:  Appears stated age Attitude/Demeanor/Rapport:   (Appropriate) Affect (typically observed):  Appropriate Orientation:  Oriented to Self, Oriented to Place, Oriented to Situation Alcohol / Substance use:  Not Applicable Psych involvement (Current and /or in the community):  No (Comment)  Discharge Needs  Concerns to be addressed:  Care Coordination Readmission within the last 30 days:  No Current discharge risk:  Physical Impairment, Dependent with Mobility Barriers to Discharge:  No Barriers Identified   Normajean Baxter, LCSW 07/07/2016, 4:02 PM

## 2016-07-07 NOTE — Progress Notes (Signed)
S:Not eating much.  Says she has not been out of bed O:BP (!) 97/41 (BP Location: Right Arm)   Pulse 92   Temp 100 F (37.8 C) (Oral)   Resp 16   Ht 5\' 6"  (1.676 m)   Wt 76.4 kg (168 lb 6.9 oz)   SpO2 92%   BMI 27.19 kg/m   Intake/Output Summary (Last 24 hours) at 07/07/16 0844 Last data filed at 07/07/16 9937  Gross per 24 hour  Intake              700 ml  Output                0 ml  Net              700 ml   Weight change:  JIR:CVELF and alert CVS:RRR Resp:clear Abd:+ BS NTND Ext:no edema on RLE, LLE wrapped  LUA AVF + bruit NEURO:CNI Ox3, no asterixis   . acetaminophen  650 mg Oral Once  . calcitRIOL  0.5 mcg Oral Q T,Th,Sa-HD  . carvedilol  6.25 mg Oral BID WC  . donepezil  5 mg Oral QHS  . enoxaparin (LOVENOX) injection  30 mg Subcutaneous Q24H  . erythromycin  1 application Both Eyes QHS  . feeding supplement (NEPRO CARB STEADY)  237 mL Oral BID BM  . ferrous sulfate  325 mg Oral BID  . gabapentin  300 mg Oral Daily  . insulin aspart  0-9 Units Subcutaneous Q4H  . insulin glargine  5 Units Subcutaneous QHS  . ketoconazole  1 application Topical Daily  . multivitamin  1 tablet Oral QHS  . polyvinyl alcohol  1 drop Both Eyes BID  . rOPINIRole  1 mg Oral BID  . rosuvastatin  5 mg Oral QHS  . sevelamer carbonate  800 mg Oral TID WC   Dg Chest Port 1 View  Result Date: 07/05/2016 CLINICAL DATA:  Chest pain which has resolved but complaint of shakiness EXAM: PORTABLE CHEST 1 VIEW COMPARISON:  Two days ago FINDINGS: Chronic cardiomegaly. Status post CABG. Mildly low lung volumes with interstitial crowding. There is no edema, consolidation, effusion, or pneumothorax. Cholecystectomy clips. IMPRESSION: No evidence of active disease. Electronically Signed   By: Monte Fantasia M.D.   On: 07/05/2016 14:51   Dg C-arm 1-60 Min  Result Date: 07/05/2016 CLINICAL DATA:  Intramedullary nail. EXAM: DG C-ARM 61-120 MIN; LEFT FEMUR 2 VIEWS COMPARISON:  Radiography 07/03/2016  FINDINGS: Fluoroscopy for retrograde intramedullary femoral nail with multiple distal interlocking screws. Especially in the coronal plane, distal femoral metaphysis fracture has significantly improved alignment. No evidence of intraoperative fracture. IMPRESSION: Fluoroscopy for ORIF of distal femur fracture. No unexpected finding. Electronically Signed   By: Monte Fantasia M.D.   On: 07/05/2016 09:49   Dg Femur Min 2 Views Left  Result Date: 07/05/2016 CLINICAL DATA:  Intramedullary nail. EXAM: DG C-ARM 61-120 MIN; LEFT FEMUR 2 VIEWS COMPARISON:  Radiography 07/03/2016 FINDINGS: Fluoroscopy for retrograde intramedullary femoral nail with multiple distal interlocking screws. Especially in the coronal plane, distal femoral metaphysis fracture has significantly improved alignment. No evidence of intraoperative fracture. IMPRESSION: Fluoroscopy for ORIF of distal femur fracture. No unexpected finding. Electronically Signed   By: Monte Fantasia M.D.   On: 07/05/2016 09:49   BMET    Component Value Date/Time   NA 131 (L) 07/07/2016 0307   NA 137 10/12/2013 1254   K 4.8 07/07/2016 0307   K 3.3 (L) 10/12/2013 1254   CL 91 (  L) 07/07/2016 0307   CL 98 10/12/2013 1254   CO2 29 07/07/2016 0307   CO2 34 (H) 10/12/2013 1254   GLUCOSE 169 (H) 07/07/2016 0307   GLUCOSE 161 (H) 10/12/2013 1254   BUN 50 (H) 07/07/2016 0307   BUN 15 10/12/2013 1254   CREATININE 7.10 (H) 07/07/2016 0307   CREATININE 2.28 (H) 10/12/2013 1254   CALCIUM 8.0 (L) 07/07/2016 0307   CALCIUM 8.5 10/12/2013 1254   CALCIUM 8.6 01/02/2013 1036   GFRNONAA 5 (L) 07/07/2016 0307   GFRNONAA 20 (L) 10/12/2013 1254   GFRAA 5 (L) 07/07/2016 0307   GFRAA 23 (L) 10/12/2013 1254   CBC    Component Value Date/Time   WBC 9.8 07/07/2016 0307   RBC 2.92 (L) 07/07/2016 0307   HGB 8.7 (L) 07/07/2016 0307   HGB 10.3 (L) 10/12/2013 1254   HCT 26.0 (L) 07/07/2016 0307   HCT 32.0 (L) 10/12/2013 1254   PLT 144 (L) 07/07/2016 0307   PLT 234  10/12/2013 1254   MCV 89.0 07/07/2016 0307   MCV 91 10/12/2013 1254   MCH 29.8 07/07/2016 0307   MCHC 33.5 07/07/2016 0307   RDW 16.0 (H) 07/07/2016 0307   RDW 15.2 (H) 10/12/2013 1254   LYMPHSABS 1.7 07/05/2016 0551   MONOABS 1.6 (H) 07/05/2016 0551   EOSABS 0.0 07/05/2016 0551   BASOSABS 0.0 07/05/2016 0551     Assessment:  1. Lt distal femur fx  SP ORIF 2. ESRD TTS [place] 3. Anemia  4. Sec HPTH on Vit D 5. DM  Plan: 1. Plan HD tomorrow 2. Start aranesp   Nakaya Mishkin T

## 2016-07-07 NOTE — Care Management Important Message (Signed)
Important Message  Patient Details  Name: [name] MRN: [id number] Date of Birth: [birth year]   Medicare Important Message Given:  Yes    Shantella Blubaugh Montine Circle 07/07/2016, 2:59 PM

## 2016-07-07 NOTE — Progress Notes (Addendum)
Patient Name: [name] Date of Encounter: 07/07/2016  Primary Cardiologist: Prior patient of Dr. Meredith Mody Problem List     Active Problems:   Nondisplaced fracture of second metatarsal bone, left foot, initial encounter for closed fracture   Closed displaced fracture of distal phalanx of left great toe   ESRD on hemodialysis ([place])   Displaced supracondylar fracture with intracondylar extension of lower end of right femur, initial encounter for closed fracture ([place])   CAD (coronary artery disease), native coronary artery   Chest pain   Closed fracture of left distal femur ([facility])   Closed nondisplaced fracture of fourth metatarsal bone of left foot   Closed nondisplaced fracture of third metatarsal bone of left foot   Multifocal atrial tachycardia ([facility])     Subjective   No complaints of chest pain or SOB  Inpatient Medications    Scheduled Meds: . acetaminophen  650 mg Oral Once  . calcitRIOL  0.5 mcg Oral Q T,Th,Sa-HD  . carvedilol  6.25 mg Oral BID WC  . [START ON 07/08/2016] darbepoetin (ARANESP) injection - DIALYSIS  100 mcg Intravenous Q Tue-HD  . donepezil  5 mg Oral QHS  . enoxaparin (LOVENOX) injection  30 mg Subcutaneous Q24H  . erythromycin  1 application Both Eyes QHS  . feeding supplement (NEPRO CARB STEADY)  237 mL Oral BID BM  . ferrous sulfate  325 mg Oral BID  . gabapentin  300 mg Oral Daily  . insulin aspart  0-9 Units Subcutaneous Q4H  . insulin glargine  5 Units Subcutaneous QHS  . ketoconazole  1 application Topical Daily  . multivitamin  1 tablet Oral QHS  . polyvinyl alcohol  1 drop Both Eyes BID  . rOPINIRole  1 mg Oral BID  . rosuvastatin  5 mg Oral QHS  . sevelamer carbonate  800 mg Oral TID WC   Continuous Infusions: . sodium chloride 10 mL/hr at 07/04/16 1747   PRN Meds: sodium chloride, sodium chloride, acetaminophen, acetaminophen-codeine, heparin, HYDROcodone-acetaminophen, lidocaine (PF), lidocaine-prilocaine, loperamide,  menthol-cetylpyridinium, ondansetron, pentafluoroprop-tetrafluoroeth   Vital Signs    Vitals:   07/06/16 0501 07/06/16 1722 07/06/16 2210 07/07/16 0358  BP: (!) 154/45 (!) 107/47 (!) 100/41 (!) 97/41  Pulse: (!) 123 (!) 105 93 92  Resp:   16 16  Temp: 100 F (37.8 C)  (!) 100.5 F (38.1 C) 100 F (37.8 C)  TempSrc: Oral  Oral Oral  SpO2: 95% 91% 92% 92%  Weight:      Height:        Intake/Output Summary (Last 24 hours) at 07/07/16 1324 Last data filed at 07/07/16 0821  Gross per 24 hour  Intake              460 ml  Output                0 ml  Net              460 ml   Filed Weights   07/03/16 1447 07/05/16 2212 07/06/16 0115  Weight: 165 lb (74.8 kg) 170 lb 10.2 oz (77.4 kg) 168 lb 6.9 oz (76.4 kg)    Physical Exam    GEN: Well nourished, well developed, in no acute distress.  HEENT: Grossly normal.  Neck: Supple, no JVD, carotid bruits, or masses. Cardiac: RRR, no murmurs, rubs, or gallops. No clubbing, cyanosis, edema.  Radials/DP/PT 2+ and equal bilaterally.  Respiratory:  Respirations regular and unlabored, clear to auscultation bilaterally.  GI: Soft, nontender, nondistended, BS + x 4. MS: no deformity or atrophy. Skin: warm and dry, no rash. Neuro:  Strength and sensation are intact. Psych: AAOx3.  Normal affect.  Labs    CBC  Recent Labs  07/05/16 0551  07/06/16 0214 07/07/16 0307  WBC 13.3*  < > 12.0* 9.8  NEUTROABS 10.0*  --   --   --   HGB 7.6*  < > 9.5* 8.7*  HCT 24.4*  < > 29.6* 26.0*  MCV 91.4  < > 88.6 89.0  PLT 202  < > 147* 144*  < > = values in this interval not displayed. Basic Metabolic Panel  Recent Labs  07/05/16 1802 07/06/16 0019 07/07/16 0307  NA  --  135 131*  K  --  3.8 4.8  CL  --  94* 91*  CO2  --  27 29  GLUCOSE  --  133* 169*  BUN  --  24* 50*  CREATININE 7.61* 4.38* 7.10*  CALCIUM  --  8.2* 8.0*  MG 1.9  --   --   PHOS  --  3.6 5.8*   Liver Function Tests  Recent Labs  07/06/16 0019 07/07/16 0307   ALBUMIN 2.8* 2.1*   No results for input(s): LIPASE, AMYLASE in the last 72 hours. Cardiac Enzymes  Recent Labs  07/05/16 1115 07/05/16 1802  CKTOTAL 3,140*  --   CKMB 15.5*  --   TROPONINI 0.03* 0.06*   BNP Invalid input(s): POCBNP D-Dimer No results for input(s): DDIMER in the last 72 hours. Hemoglobin A1C No results for input(s): HGBA1C in the last 72 hours. Fasting Lipid Panel No results for input(s): CHOL, HDL, LDLCALC, TRIG, CHOLHDL, LDLDIRECT in the last 72 hours. Thyroid Function Tests  Recent Labs  07/05/16 1802  TSH 1.703    Telemetry    NSR - Personally Reviewed  ECG    NSR - Personally Reviewed  Radiology    No results found.  Cardiac Studies   Echo pending  Patient Profile     This is an 83 y.o. female with  a h/o CAD s/p 5 vessel CABG in 12/2006, reported PAF in 2015 post procedure not on long term full-dose anticoagulation, ESRD on HD (TTS), dementia, multiple mechanical falls, DM, PAD, anemia of chronic disease, HTN, HLD, obesity, mild aortic stenosis, moderate pulmonary HTN with PASP 12mmHg and OA who presented to [facility] on [facility] with mechanical fall leading to the displaced angulated impacted left suprandylar femur fracture fracture. She underwent successful ORIF on 4/7. In the recovery room she noted left sided chest pain. Initial troponin 0.03. EKG with MAT. Cardiology asked to evaluate.   Assessment & Plan    1. Chest pain/CAD s/p remote CABG  -Brief episode of CP in PACU lasting approximately 15 minutes before self resolving and has not had any further episodes -Initial troponin 0.03 > 0.6. CPK was elevated at 3140 with MB 15.5 and RI of 0.5 c/w skeletal muscle trauma from fall and not ACS. - 2D echo showed normal LVF with EF 65-70% with no RWMAs therefore would not pursue ischemic workup at this time - Hold on heparin gtt 2/2 recent surgery and the above - restart ASA if ok with ortho - continue Beta blocker and statin. - Check lipid and  A1c for further risk stratification - Cannot rule out PE/fat embolism - RV mildly dilated on echo but normal RVF.  She has moderate pulmonary HTN with PASP 43mmHg similar to echo findings 2015 except  that now RV mildly dilated.   - D-dimer will likely be elevated but if normal it rules out PE.  If elevated will need LE venous dopplers and VQ scan.  2. MAT - likely related to her acute trauma/ABL anemia - Asymptomatic - Will continue beta blocker - cannot increase further due to soft BP - magnesium level borderline low - replete to goal of at least 2.0 - defer to nephrology - TSH normal - K+ deferred to nephrology   3. ESRD on HD: - Dialysis TTS - Per nephrology  4. Closed displaced angulated left femur fracture: - S/p ORIF - Per ortho  5. ABL anemia/anemia of chronic disease: - Maintain HGB > 8-8.5  6. Dementia  Signed, Fransico Him, MD  07/07/2016, 1:24 PM

## 2016-07-08 ENCOUNTER — Inpatient Hospital Stay (HOSPITAL_COMMUNITY): Payer: Medicare (Managed Care)

## 2016-07-08 ENCOUNTER — Other Ambulatory Visit (HOSPITAL_COMMUNITY): Payer: Self-pay

## 2016-07-08 LAB — LIPID PANEL
Cholesterol: 95 mg/dL (ref 0–200)
HDL: 27 mg/dL — ABNORMAL LOW (ref >40)
LDL CALC: 37 mg/dL (ref 0–99)
TRIGLYCERIDES: 153 mg/dL — AB (ref <150)
Total CHOL/HDL Ratio: 3.5 RATIO
VLDL: 31 mg/dL (ref 0–40)

## 2016-07-08 LAB — RENAL FUNCTION PANEL
ALBUMIN: 2.2 g/dL — AB (ref 3.5–5.0)
Anion gap: 14 (ref 5–15)
BUN: 71 mg/dL — AB (ref 6–20)
CO2: 29 mmol/L (ref 22–32)
CREATININE: 8.62 mg/dL — AB (ref 0.44–1.00)
Calcium: 8 mg/dL — ABNORMAL LOW (ref 8.9–10.3)
Chloride: 91 mmol/L — ABNORMAL LOW (ref 101–111)
GFR, EST AFRICAN AMERICAN: 4 mL/min — AB (ref >60)
GFR, EST NON AFRICAN AMERICAN: 4 mL/min — AB (ref >60)
Glucose, Bld: 146 mg/dL — ABNORMAL HIGH (ref 65–99)
PHOSPHORUS: 5.3 mg/dL — AB (ref 2.5–4.6)
POTASSIUM: 4.5 mmol/L (ref 3.5–5.1)
Sodium: 134 mmol/L — ABNORMAL LOW (ref 135–145)

## 2016-07-08 LAB — GLUCOSE, CAPILLARY
GLUCOSE-CAPILLARY: 142 mg/dL — AB (ref 65–99)
GLUCOSE-CAPILLARY: 155 mg/dL — AB (ref 65–99)
GLUCOSE-CAPILLARY: 157 mg/dL — AB (ref 65–99)
Glucose-Capillary: 237 mg/dL — ABNORMAL HIGH (ref 65–99)
Glucose-Capillary: 86 mg/dL (ref 65–99)
Glucose-Capillary: 173 mg/dL — ABNORMAL HIGH (ref 65–99)

## 2016-07-08 LAB — CBC
HCT: 26.4 % — ABNORMAL LOW (ref 36.0–46.0)
Hemoglobin: 8.5 g/dL — ABNORMAL LOW (ref 12.0–15.0)
MCH: 28.6 pg (ref 26.0–34.0)
MCHC: 32.2 g/dL (ref 30.0–36.0)
MCV: 88.9 fL (ref 78.0–100.0)
PLATELETS: 163 10*3/uL (ref 150–400)
RBC: 2.97 MIL/uL — AB (ref 3.87–5.11)
RDW: 15.8 % — AB (ref 11.5–15.5)
WBC: 8 10*3/uL (ref 4.0–10.5)

## 2016-07-08 LAB — D-DIMER, QUANTITATIVE (NOT AT ARMC)

## 2016-07-08 MED ORDER — TECHNETIUM TO 99M ALBUMIN AGGREGATED: 4.0000 | Freq: Once | INTRAVENOUS | Status: DC | PRN

## 2016-07-08 MED ORDER — AMIODARONE HCL 100 MG PO TABS
200.0000 mg | ORAL_TABLET | Freq: Every day | ORAL | Status: DC
  Administered 2016-07-08 – 2016-07-10 (×3): 200 mg via ORAL
  Filled 2016-07-08 (×3): qty 2

## 2016-07-08 MED ORDER — CALCITRIOL 0.5 MCG PO CAPS
ORAL_CAPSULE | ORAL | Status: AC
  Filled 2016-07-08: qty 1

## 2016-07-08 MED ORDER — DARBEPOETIN ALFA 100 MCG/0.5ML IJ SOSY
PREFILLED_SYRINGE | INTRAMUSCULAR | Status: AC
  Filled 2016-07-08: qty 0.5

## 2016-07-08 MED ORDER — TECHNETIUM TC 99M DIETHYLENETRIAME-PENTAACETIC ACID: 30.0000 | Freq: Once | INTRAVENOUS | Status: DC | PRN

## 2016-07-08 NOTE — Progress Notes (Signed)
D-Dimer > 20.  Will get LE venous dopplers and VQ scan

## 2016-07-08 NOTE — Progress Notes (Signed)
S: Pain controlled.  Still not eating much.  Still says she has not been out of bed? O:BP (!) 103/56 (BP Location: Right Arm)   Pulse 72   Temp 98.1 F (36.7 C) (Oral)   Resp 13   Ht 5\' 6"  (1.676 m)   Wt 76.4 kg (168 lb 6.9 oz)   SpO2 97%   BMI 27.19 kg/m   Intake/Output Summary (Last 24 hours) at 07/08/16 0913 Last data filed at 07/08/16 0100  Gross per 24 hour  Intake              120 ml  Output                0 ml  Net              120 ml   Weight change:  CXK:GYJEH and alert CVS:RRR Resp:clear Abd:+ BS NTND Ext:no edema on RLE, LLE wrapped  LUA AVF + bruit NEURO:CNI Ox3, no asterixis   . acetaminophen  500 mg Oral TID  . acetaminophen  650 mg Oral Once  . calcitRIOL  0.5 mcg Oral Q T,Th,Sa-HD  . carvedilol  6.25 mg Oral BID WC  . darbepoetin (ARANESP) injection - DIALYSIS  100 mcg Intravenous Q Tue-HD  . donepezil  5 mg Oral QHS  . enoxaparin (LOVENOX) injection  30 mg Subcutaneous Q24H  . erythromycin  1 application Both Eyes QHS  . feeding supplement (NEPRO CARB STEADY)  237 mL Oral BID BM  . ferrous sulfate  325 mg Oral BID  . gabapentin  300 mg Oral Daily  . insulin aspart  0-9 Units Subcutaneous Q4H  . insulin glargine  5 Units Subcutaneous QHS  . ketoconazole  1 application Topical Daily  . multivitamin  1 tablet Oral QHS  . polyvinyl alcohol  1 drop Both Eyes BID  . rOPINIRole  1 mg Oral BID  . rosuvastatin  5 mg Oral QHS  . sevelamer carbonate  800 mg Oral TID WC   No results found. BMET    Component Value Date/Time   NA 134 (L) 07/08/2016 0638   NA 137 10/12/2013 1254   K 4.5 07/08/2016 0638   K 3.3 (L) 10/12/2013 1254   CL 91 (L) 07/08/2016 0638   CL 98 10/12/2013 1254   CO2 29 07/08/2016 0638   CO2 34 (H) 10/12/2013 1254   GLUCOSE 146 (H) 07/08/2016 0638   GLUCOSE 161 (H) 10/12/2013 1254   BUN 71 (H) 07/08/2016 0638   BUN 15 10/12/2013 1254   CREATININE 8.62 (H) 07/08/2016 0638   CREATININE 2.28 (H) 10/12/2013 1254   CALCIUM 8.0 (L)  07/08/2016 0638   CALCIUM 8.5 10/12/2013 1254   CALCIUM 8.6 01/02/2013 1036   GFRNONAA 4 (L) 07/08/2016 0638   GFRNONAA 20 (L) 10/12/2013 1254   GFRAA 4 (L) 07/08/2016 0638   GFRAA 23 (L) 10/12/2013 1254   CBC    Component Value Date/Time   WBC 8.0 07/08/2016 0638   RBC 2.97 (L) 07/08/2016 0638   HGB 8.5 (L) 07/08/2016 0638   HGB 10.3 (L) 10/12/2013 1254   HCT 26.4 (L) 07/08/2016 0638   HCT 32.0 (L) 10/12/2013 1254   PLT 163 07/08/2016 0638   PLT 234 10/12/2013 1254   MCV 88.9 07/08/2016 0638   MCV 91 10/12/2013 1254   MCH 28.6 07/08/2016 0638   MCHC 32.2 07/08/2016 0638   RDW 15.8 (H) 07/08/2016 0638   RDW 15.2 (H) 10/12/2013 1254   LYMPHSABS 1.7  07/05/2016 0551   MONOABS 1.6 (H) 07/05/2016 0551   EOSABS 0.0 07/05/2016 0551   BASOSABS 0.0 07/05/2016 0551     Assessment:  1. Lt distal femur fx  SP ORIF 2. ESRD TTS [place] 3. Anemia  4. Sec HPTH on Vit D 5. DM  Plan: 1.  HD today 2. Note plans for SNF    Johnnae Impastato T

## 2016-07-08 NOTE — Progress Notes (Signed)
qPhysical Therapy Treatment Patient Details Name: [name] MRN: [id number] DOB: [birth year] Today's Date: 07/08/2016    History of Present Illness Pt is an 83 yo female admitted through ED following a fall off a curb on 07/03/16 resulting in multiple fractures. Pt sustained a L 2nd metatarsal fx, R femur fx, L distal femur fx, and underwent an ORIF to L femur on 07/05/16. PMH significant for CAD with CABG x5 2008, dementia, DM, HLD, Neuropathy, HF, OA, ESRD on HD.     PT Comments    Pt admitted with above diagnosis. Pt currently with functional limitations due to balance and endurance deficits. Pt was able to squat pivot to recliner with mod assist of 2 but needs assist to maintain NWB left LE at all times.  Pt will need SNF.  HR 84-108 bpm with pt fatigued after squat pivot transfer.  Pt will benefit from skilled PT to increase their independence and safety with mobility to allow discharge to the venue listed below.    Follow Up Recommendations  SNF;Supervision/Assistance - 24 hour     Equipment Recommendations  None recommended by PT    Recommendations for Other Services       Precautions / Restrictions Precautions Precautions: Fall Required Braces or Orthoses: Knee Immobilizer - Left Knee Immobilizer - Left: On at all times Restrictions Weight Bearing Restrictions: Yes RLE Weight Bearing: Weight bearing as tolerated (transfer only) LLE Weight Bearing: Non weight bearing    Mobility  Bed Mobility Overal bed mobility: Needs Assistance Bed Mobility: Supine to Sit;Sit to Supine     Supine to sit: HOB elevated;Mod assist     General bed mobility comments: LEss assist needed to come to EOB.  Assisted with LE only.   Transfers Overall transfer level: Needs assistance Equipment used: Rolling walker (2 wheeled);None Transfers: Sit to/from [employer] Sit to Stand: Total assist;From elevated surface;+2 physical assistance   Squat pivot transfers: Mod  assist;+2 physical assistance;From elevated surface     General transfer comment: Unable to achieve standing, attempted x2 with pt being total assist and unable to stand to RW and maintain NWB on the left LE.  Performed squat pivot to recliner with mod assist of 2 with PT holding left LE off floor to maintain NWB left LE.Marland Kitchen   Ambulation/Gait                 Stairs            Wheelchair Mobility    Modified Rankin (Stroke Patients Only)       Balance Overall balance assessment: Needs assistance Sitting-balance support: Single extremity supported;Feet supported Sitting balance-Leahy Scale: Fair                                      Cognition Arousal/Alertness: Awake/alert Behavior During Therapy: Flat affect Overall Cognitive Status: No family/caregiver present to determine baseline cognitive functioning Area of Impairment: Orientation;Following commands;Safety/judgement;Awareness;Problem solving                 Orientation Level: Disoriented to;Time;Situation     Following Commands: Follows one step commands inconsistently Safety/Judgement: Decreased awareness of deficits   Problem Solving: Slow processing;Decreased initiation;Requires verbal cues General Comments: Doing better today but still needs cues.       Exercises General Exercises - Lower Extremity Ankle Circles/Pumps: AROM;Both;10 reps;Supine Quad Sets: AROM;Right;10 reps;Supine Straight Leg Raises: 10 reps;Supine;AAROM;Left  General Comments        Pertinent Vitals/Pain Pain Assessment: No/denies pain    Home Living                      Prior Function            PT Goals (current goals can now be found in the care plan section) Progress towards PT goals: Progressing toward goals    Frequency    Min 3X/week      PT Plan Current plan remains appropriate    Co-evaluation             End of Session Equipment Utilized During Treatment: Gait  belt Activity Tolerance: Patient limited by fatigue Patient left: with call bell/phone within reach;in chair;with chair alarm set Nurse Communication: Mobility status;Need for lift equipment (May need maximove back to bed) PT Visit Diagnosis: Muscle weakness (generalized) (M62.81);History of falling (Z91.81);Difficulty in walking, not elsewhere classified (R26.2)     Time: 7322-0254 PT Time Calculation (min) (ACUTE ONLY): 13 min  Charges:  $Therapeutic Activity: 8-22 mins                    G Codes:       Leor Whyte,PT Acute Rehabilitation [phone] (pager)    Denice Paradise 07/08/2016, 11:46 AM

## 2016-07-08 NOTE — Progress Notes (Signed)
Patient Name: [name] Date of Encounter: 07/08/2016  Primary Cardiologist: Prior patient of Dr. Meredith Mody Problem List     Active Problems:   Nondisplaced fracture of second metatarsal bone, left foot, initial encounter for closed fracture   Closed displaced fracture of distal phalanx of left great toe   ESRD on hemodialysis ([place])   Displaced supracondylar fracture with intracondylar extension of lower end of right femur, initial encounter for closed fracture ([place])   CAD (coronary artery disease), native coronary artery   Chest pain   Closed fracture of left distal femur (HCC)   Closed nondisplaced fracture of fourth metatarsal bone of left foot   Closed nondisplaced fracture of third metatarsal bone of left foot   Multifocal atrial tachycardia (HCC)     Subjective    She denies any further CP or SOB  Inpatient Medications    Scheduled Meds: . acetaminophen  500 mg Oral TID  . acetaminophen  650 mg Oral Once  . calcitRIOL  0.5 mcg Oral Q T,Th,Sa-HD  . carvedilol  6.25 mg Oral BID WC  . darbepoetin (ARANESP) injection - DIALYSIS  100 mcg Intravenous Q Tue-HD  . donepezil  5 mg Oral QHS  . enoxaparin (LOVENOX) injection  30 mg Subcutaneous Q24H  . erythromycin  1 application Both Eyes QHS  . feeding supplement (NEPRO CARB STEADY)  237 mL Oral BID BM  . ferrous sulfate  325 mg Oral BID  . gabapentin  300 mg Oral Daily  . insulin aspart  0-9 Units Subcutaneous Q4H  . insulin glargine  5 Units Subcutaneous QHS  . ketoconazole  1 application Topical Daily  . multivitamin  1 tablet Oral QHS  . polyvinyl alcohol  1 drop Both Eyes BID  . rOPINIRole  1 mg Oral BID  . rosuvastatin  5 mg Oral QHS  . sevelamer carbonate  800 mg Oral TID WC   Continuous Infusions: . sodium chloride 10 mL/hr at 07/04/16 1747   PRN Meds: sodium chloride, sodium chloride, acetaminophen-codeine, heparin, HYDROcodone-acetaminophen, lidocaine (PF), lidocaine-prilocaine, loperamide,  menthol-cetylpyridinium, ondansetron, pentafluoroprop-tetrafluoroeth   Vital Signs    Vitals:   07/07/16 0358 07/07/16 1800 07/07/16 2121 07/08/16 0429  BP: (!) 97/41 (!) 88/49 (!) 85/42 (!) 103/56  Pulse: 92  82 72  Resp: 16  14 13   Temp: 100 F (37.8 C)  98.6 F (37 C) 98.1 F (36.7 C)  TempSrc: Oral  Oral Oral  SpO2: 92%  92% 97%  Weight:      Height:        Intake/Output Summary (Last 24 hours) at 07/08/16 0925 Last data filed at 07/08/16 0100  Gross per 24 hour  Intake              120 ml  Output                0 ml  Net              120 ml   Filed Weights   07/03/16 1447 07/05/16 2212 07/06/16 0115  Weight: 165 lb (74.8 kg) 170 lb 10.2 oz (77.4 kg) 168 lb 6.9 oz (76.4 kg)    Physical Exam    GEN: WD, WN in NAD HEENT: benign with no gross abnormalities Neck: No JVD or massess Cardiac: RRR with no M/R/G Respiratory:  CTA anteriorly GI: Soft, NT, ND with active BS MS: no edema Skin: warm and dry Neuro:  A&O x 3 Psych: normal affect  Labs    CBC  Recent Labs  07/07/16 0307 07/08/16 0638  WBC 9.8 8.0  HGB 8.7* 8.5*  HCT 26.0* 26.4*  MCV 89.0 88.9  PLT 144* 809   Basic Metabolic Panel  Recent Labs  07/05/16 1802  07/07/16 0307 07/08/16 0638  NA  --   < > 131* 134*  K  --   < > 4.8 4.5  CL  --   < > 91* 91*  CO2  --   < > 29 29  GLUCOSE  --   < > 169* 146*  BUN  --   < > 50* 71*  CREATININE 7.61*  < > 7.10* 8.62*  CALCIUM  --   < > 8.0* 8.0*  MG 1.9  --   --   --   PHOS  --   < > 5.8* 5.3*  < > = values in this interval not displayed. Liver Function Tests  Recent Labs  07/07/16 0307 07/08/16 0638  ALBUMIN 2.1* 2.2*   No results for input(s): LIPASE, AMYLASE in the last 72 hours. Cardiac Enzymes  Recent Labs  07/05/16 1115 07/05/16 1802  CKTOTAL 3,140*  --   CKMB 15.5*  --   TROPONINI 0.03* 0.06*   BNP Invalid input(s): POCBNP D-Dimer No results for input(s): DDIMER in the last 72 hours. Hemoglobin A1C No results for  input(s): HGBA1C in the last 72 hours. Fasting Lipid Panel No results for input(s): CHOL, HDL, LDLCALC, TRIG, CHOLHDL, LDLDIRECT in the last 72 hours. Thyroid Function Tests  Recent Labs  07/05/16 1802  TSH 1.703    Telemetry    NSR wit frequent bursts of nonsustained atrial tachycardia and PACs- Personally Reviewed  ECG    NSR - Personally Reviewed  Radiology    No results found.  Cardiac Studies   2D echo 07/07/2016 Study Conclusions  - Left ventricle: The cavity size was normal. There was mild   concentric hypertrophy. Systolic function was vigorous. The   estimated ejection fraction was in the range of 65% to 70%. Wall   motion was normal; there were no regional wall motion   abnormalities. Doppler parameters are consistent with abnormal   left ventricular relaxation (grade 1 diastolic dysfunction).   There was no evidence of elevated ventricular filling pressure by   Doppler parameters. - Aortic valve: Trileaflet; mildly thickened, mildly calcified   leaflets. Mean gradient (S): 8 mm Hg. Peak gradient (S): 13 mm   Hg. Valve area (VTI): 1.92 cm^2. Valve area (Vmax): 1.91 cm^2.   Valve area (Vmean): 1.51 cm^2. - Mitral valve: Mildly thickened leaflets and moderately calcified   chordae. - Left atrium: The atrium was normal in size. - Right ventricle: The cavity size was mildly dilated. Wall   thickness was normal. Systolic function was normal. - Tricuspid valve: There was moderate regurgitation. - Pulmonary arteries: Systolic pressure was moderately increased.   PA peak pressure: 55 mm Hg (S). - Inferior vena cava: The vessel was dilated. The respirophasic   diameter changes were blunted (< 50%), consistent with elevated   central venous pressure.  Patient Profile     This is an 83 y.o. female with  a h/o CAD s/p 5 vessel CABG in 12/2006, reported PAF in 2015 post procedure not on long term full-dose anticoagulation, ESRD on HD (TTS), dementia, multiple  mechanical falls, DM, PAD, anemia of chronic disease, HTN, HLD, obesity, mild aortic stenosis, moderate pulmonary HTN with PASP 104mmHg and OA who presented to [facility]  on 4/5 with mechanical fall leading to the displaced angulated impacted left suprandylar femur fracture fracture. She underwent successful ORIF on 4/7. In the recovery room she noted left sided chest pain. Initial troponin 0.03. EKG with MAT. Cardiology asked to evaluate.   Assessment & Plan    1. Chest pain/CAD s/p remote CABG  -Brief episode of CP in PACU lasting approximately 15 minutes before self resolving.  She has not had any further episodes since that occurrence. - Initial troponin 0.03 > 0.6. CPK was elevated at 3140 with MB 15.5 and RI of 0.5 c/w skeletal muscle trauma from fall and not ACS. - 2D echo showed normal LVF with EF 65-70% with no RWMAs and therefore no further ischemic workup recommended at this time - restart ASA if ok with ortho - continue Beta blocker and statin. - Check lipid and A1c for further risk stratification - Cannot rule out PE/fat embolism - RV mildly dilated on echo but normal RVF.  She has moderate pulmonary HTN with PASP 100mmHg similar to echo findings 2015 except that now RV mildly dilated.   - D-dimer will likely be elevated but if normal it rules out PE. I will get a statin D-Dimer and if elevated will evaluate further with  LE venous dopplers and VQ scan.  2. PAT - likely related to her acute trauma/ABL anemia - Asymptomatic but continues to have quite a few runs in the 120's - Will continue beta blocker - cannot increase further due to soft BP - TSH normal - will start low dose Amio 200mg  daily to see if we can suppress frequent PAT and prevent progression to afib. - follow daily EKG for QTc  3. ESRD on HD: - Dialysis TTS - Per nephrology  4. Closed displaced angulated left femur fracture: - S/p ORIF - Per ortho  5. ABL anemia/anemia of chronic disease: - Maintain HGB >  8-8.5  6. Dementia  Signed, Fransico Him, MD  07/08/2016, 9:25 AM

## 2016-07-08 NOTE — Progress Notes (Signed)
LCSW following for disposition: SNF at discharge.  Patient has been accepted to [facility]. Discussed plan with SW at [facility]: Raquel Sarna who reports when patient medically stable she can transfer. Her diaylsis remains in [place] has arranged transportation.  No other needs currently. Will continue to assist with discharge planning.  Lane Hacker, MSW Clinical Social Work: [employer] Coverage for :  [phone]

## 2016-07-08 NOTE — Progress Notes (Signed)
PROGRESS NOTE    SHEVAUN LOVAN  ZYS:[id number] DOB: [birth year] DOA: 07/03/2016 PCP: Jani Gravel, MD    Brief Narrative:  [name] is an 83 y.o. female with hx of dementia, mild DM, ataxia and fall risks, ESRD on HD (T Th Sat), HLD, HTN, fell at home without loss of consciousness, presented to the ER with left leg pain.  Work up in the ER included an X ray of the femur showing comminuted distal femur Fx with some angulation, Xray of the left foot showed toe and metatarsal Fx, LS spine film showed compressive Fx of undetermined age, no Fx of the left pelvis, and CXR showed cardiomegaly and no infiltrate.  Her UA was negative, and lab work is still pending.  Orthopedics was consulted, and Dr Lorin Mercy planned to perform ORIF of the left femur, and foot did not require surgical intervention.  Hospitalist was asked to admit her for same.   Assessment & Plan:   Active Problems:   Nondisplaced fracture of second metatarsal bone, left foot, initial encounter for closed fracture   Closed displaced fracture of distal phalanx of left great toe   ESRD on hemodialysis ([place])   Displaced supracondylar fracture with intracondylar extension of lower end of right femur, initial encounter for closed fracture (HCC)   CAD (coronary artery disease), native coronary artery   Chest pain   Closed fracture of left distal femur (HCC)   Closed nondisplaced fracture of fourth metatarsal bone of left foot   Closed nondisplaced fracture of third metatarsal bone of left foot   Multifocal atrial tachycardia ([place])  #1 left distal femur fracture comminuted Secondary to mechanical fall. Patient has been seen by orthopedics and status post ORIF left closed supracondylar distal femur fracture with intracondylar extension per Dr. Lorin Mercy 07/05/2016.Pain management. Per orthopedics.  #2 left foot fracture Per orthopedics no surgical intervention at this time. Pain management. Supportive care. Per orthopedics.  #3 postop  chest pain/ MAT/tachycardia Postoperatively patient was noted in the recovery room 12 complaints of left-sided chest pain noted also to be tachycardic. Cardiology was consulted as patient with significant coronary artery disease history. Patient was seen in consultation by cardiology and at the time patient chest pain-free. Patient also noted to be anemic with a hemoglobin of 7.1 postprocedure. EKG done showed MAT. Patient given IV metoprolol with improvement with tachycardia. Cardiology recommended serial cardiac enzymes which were minimally elevated, 2-D echo to evaluate left ventricular ejection fraction and wall motion which is currently pending. TSH within normal limits at 1.703. Continue Coreg. D-dimer ordered noted to be elevated and a such cardiology as ordered lower extremity Dopplers and VQ scan. Cardiology recommended beta blocker and aspirin if okay with orthopedics.  #3 end-stage renal disease on hemodialysis Patient has a seen in consultation by nephrology. Patient on Tuesday Thursday Saturday schedule. Per nephrology.   #4 diabetes mellitus Patient with CBGs in the 142-237. Discontinued oral Amaryl. Continue Lantus 5 units daily. Sliding scale insulin.  #5 hyperlipidemia Continue statin.  #6 mild dementia Stable. Continue Aricept.  #7 postop acute blood loss anemia Patient s/p 2 units packed red blood cells in the PACU to maintain hemoglobin greater than 8-8.5 as patient noted with complaints of chest pain postoperatively and noted to be tachycardic. Hemoglobin currently at 8.5.  #8 postop fever Blood cultures with no growth to date. Chest x-ray negative for any acute abnormalities. Fever curve trending down. Monitor.    DVT prophylaxis: SCDs Code Status: Full Family Communication: Updated patient.  No family at bedside. Disposition Plan: Pending surgery and PT OT evaluation postoperatively and per orthopedics. Likely skilled nursing facility.   Consultants:    Orthopedics: Dr. Lorin Mercy 07/04/2016  Nephrology: Dr. Jonnie Finner 07/04/2016  Cardiology: Dr. Wynonia Lawman 07/05/2016  Procedures:  Plane. The left foot 07/03/2016  Plain films of the left hip 03/04/2017  Principal's of the left knee 07/03/2016  Plain films of the L-spine 07/03/2016 at slight chest x-ray 07/03/2016  Plain films of the left ankle 07/03/2016  2 units packed red blood cells being transfused 07/05/2016  2-D echo 07/07/2016  Antimicrobials:   None   Subjective: Patient in HD. No chest pain. No shortness of breath.  Objective: Vitals:   07/07/16 0358 07/07/16 1800 07/07/16 2121 07/08/16 0429  BP: (!) 97/41 (!) 88/49 (!) 85/42 (!) 103/56  Pulse: 92  82 72  Resp: 16  14 13   Temp: 100 F (37.8 C)  98.6 F (37 C) 98.1 F (36.7 C)  TempSrc: Oral  Oral Oral  SpO2: 92%  92% 97%  Weight:      Height:        Intake/Output Summary (Last 24 hours) at 07/08/16 1412 Last data filed at 07/08/16 0900  Gross per 24 hour  Intake              236 ml  Output                0 ml  Net              236 ml   Filed Weights   07/03/16 1447 07/05/16 2212 07/06/16 0115  Weight: 74.8 kg (165 lb) 77.4 kg (170 lb 10.2 oz) 76.4 kg (168 lb 6.9 oz)    Examination:  General exam: Appears calm and comfortable. In HD. Respiratory system: Clear to auscultation anterior lung fields. Respiratory effort normal. Cardiovascular system: S1 & S2 heard, tachycardia. No JVD, murmurs, rubs, gallops or clicks. No pedal edema. Gastrointestinal system: Abdomen is nondistended, soft and nontender. No organomegaly or masses felt. Normal bowel sounds heard. Central nervous system: Alert and oriented. No focal neurological deficits. Extremities: Left lower extremity in splint and wrapped. Skin: No rashes, lesions or ulcers Psychiatry: Judgement and insight appear fair. Mood & affect appropriate.     Data Reviewed: I have personally reviewed following labs and imaging studies  CBC:  Recent  Labs Lab 07/03/16 2039 07/04/16 0944 07/05/16 0551  07/05/16 1004 07/05/16 1802 07/06/16 0214 07/07/16 0307 07/08/16 0638  WBC 11.0* 11.6* 13.3*  --   --  11.0* 12.0* 9.8 8.0  NEUTROABS 9.0* 9.1* 10.0*  --   --   --   --   --   --   HGB 10.5* 9.9* 7.6*  < > 7.1* 9.6* 9.5* 8.7* 8.5*  HCT 32.4* 31.2* 24.4*  < > 21.0* 28.9* 29.6* 26.0* 26.4*  MCV 90.3 91.8 91.4  --   --  88.9 88.6 89.0 88.9  PLT 185 183 202  --   --  140* 147* 144* 163  < > = values in this interval not displayed. Basic Metabolic Panel:  Recent Labs Lab 07/04/16 0944 07/05/16 0551 07/05/16 0643 07/05/16 1004 07/05/16 1802 07/06/16 0019 07/07/16 0307 07/08/16 0638  NA 135 135 134* 138  --  135 131* 134*  K 3.9 4.5 4.5 3.7  --  3.8 4.8 4.5  CL 92* 90*  --   --   --  94* 91* 91*  CO2 28 30  --   --   --  27 29 29   GLUCOSE 213* 163* 159* 159*  --  133* 169* 146*  BUN 21* 40*  --   --   --  24* 50* 71*  CREATININE 4.88* 6.94*  --   --  7.61* 4.38* 7.10* 8.62*  CALCIUM 8.8* 8.4*  --   --   --  8.2* 8.0* 8.0*  MG  --   --   --   --  1.9  --   --   --   PHOS 4.0 5.7*  --   --   --  3.6 5.8* 5.3*   GFR: Estimated Creatinine Clearance: 5.2 mL/min (A) (by C-G formula based on SCr of 8.62 mg/dL (H)). Liver Function Tests:  Recent Labs Lab 07/04/16 0944 07/05/16 0551 07/06/16 0019 07/07/16 0307 07/08/16 3557  ALBUMIN 3.1* 2.4* 2.8* 2.1* 2.2*   No results for input(s): LIPASE, AMYLASE in the last 168 hours. No results for input(s): AMMONIA in the last 168 hours. Coagulation Profile: No results for input(s): INR, PROTIME in the last 168 hours. Cardiac Enzymes:  Recent Labs Lab 07/03/16 2039 07/05/16 1115 07/05/16 1802  CKTOTAL  --  3,140*  --   CKMB  --  15.5*  --   TROPONINI <0.03 0.03* 0.06*   BNP (last 3 results) No results for input(s): PROBNP in the last 8760 hours. HbA1C: No results for input(s): HGBA1C in the last 72 hours. CBG:  Recent Labs Lab 07/07/16 2042 07/08/16 0022  07/08/16 0348 07/08/16 0609 07/08/16 1140  GLUCAP 170* 142* 155* 157* 237*   Lipid Profile: No results for input(s): CHOL, HDL, LDLCALC, TRIG, CHOLHDL, LDLDIRECT in the last 72 hours. Thyroid Function Tests:  Recent Labs  07/05/16 1802  TSH 1.703   Anemia Panel: No results for input(s): VITAMINB12, FOLATE, FERRITIN, TIBC, IRON, RETICCTPCT in the last 72 hours. Sepsis Labs: No results for input(s): PROCALCITON, LATICACIDVEN in the last 168 hours.  Recent Results (from the past 240 hour(s))  Surgical PCR screen     Status: None   Collection Time: 07/04/16  3:18 AM  Result Value Ref Range Status   MRSA, PCR NEGATIVE NEGATIVE Final   Staphylococcus aureus NEGATIVE NEGATIVE Final    Comment:        The Xpert SA Assay (FDA approved for NASAL specimens in patients over 81 years of age), is one component of a comprehensive surveillance program.  Test performance has been validated by [facility] for patients greater than or equal to 50 year old. It is not intended to diagnose infection nor to guide or monitor treatment.   Culture, blood (Routine X 2) w Reflex to ID Panel     Status: None (Preliminary result)   Collection Time: 07/06/16  9:22 AM  Result Value Ref Range Status   Specimen Description BLOOD RIGHT HAND  Final   Special Requests IN PEDIATRIC BOTTLE Blood Culture adequate volume  Final   Culture NO GROWTH 2 DAYS  Final   Report Status PENDING  Incomplete  Culture, blood (Routine X 2) w Reflex to ID Panel     Status: None (Preliminary result)   Collection Time: 07/06/16  9:32 AM  Result Value Ref Range Status   Specimen Description BLOOD RIGHT HAND  Final   Special Requests IN PEDIATRIC BOTTLE Blood Culture adequate volume  Final   Culture NO GROWTH 2 DAYS  Final   Report Status PENDING  Incomplete         Radiology Studies: No results found.  Scheduled Meds: . acetaminophen  500 mg Oral TID  . acetaminophen  650 mg Oral Once  . amiodarone   200 mg Oral Daily  . calcitRIOL  0.5 mcg Oral Q T,Th,Sa-HD  . carvedilol  6.25 mg Oral BID WC  . darbepoetin (ARANESP) injection - DIALYSIS  100 mcg Intravenous Q Tue-HD  . donepezil  5 mg Oral QHS  . enoxaparin (LOVENOX) injection  30 mg Subcutaneous Q24H  . erythromycin  1 application Both Eyes QHS  . feeding supplement (NEPRO CARB STEADY)  237 mL Oral BID BM  . ferrous sulfate  325 mg Oral BID  . gabapentin  300 mg Oral Daily  . insulin aspart  0-9 Units Subcutaneous Q4H  . insulin glargine  5 Units Subcutaneous QHS  . ketoconazole  1 application Topical Daily  . multivitamin  1 tablet Oral QHS  . polyvinyl alcohol  1 drop Both Eyes BID  . rOPINIRole  1 mg Oral BID  . rosuvastatin  5 mg Oral QHS  . sevelamer carbonate  800 mg Oral TID WC   Continuous Infusions: . sodium chloride 10 mL/hr at 07/04/16 1747     LOS: 5 days    Time spent: 66 minutes    THOMPSON,DANIEL, MD [facility] Pager [phone]  If 7PM-7AM, please contact night-coverage [email or web address] Password [email or web address] 07/08/2016, 2:12 PM

## 2016-07-08 NOTE — Progress Notes (Signed)
   Subjective: 3 Days Post-Op Procedure(s) (LRB): INTRAMEDULLARY (IM) RETROGRADE FEMORAL NAILING LEFT (Left) Patient reports pain as mild.    Objective: Vital signs in last 24 hours: Temp:  [98.1 F (36.7 C)-98.6 F (37 C)] 98.1 F (36.7 C) (04/10 0429) Pulse Rate:  [72-82] 72 (04/10 0429) Resp:  [13-14] 13 (04/10 0429) BP: (85-103)/(42-56) 103/56 (04/10 0429) SpO2:  [92 %-97 %] 97 % (04/10 0429)  Intake/Output from previous day: 04/09 0701 - 04/10 0700 In: 240 [P.O.:240] Out: 0  Intake/Output this shift: No intake/output data recorded.   Recent Labs  07/05/16 1004 07/05/16 1802 07/06/16 0214 07/07/16 0307 07/08/16 0638  HGB 7.1* 9.6* 9.5* 8.7* 8.5*    Recent Labs  07/07/16 0307 07/08/16 0638  WBC 9.8 8.0  RBC 2.92* 2.97*  HCT 26.0* 26.4*  PLT 144* 163    Recent Labs  07/06/16 0019 07/07/16 0307  NA 135 131*  K 3.8 4.8  CL 94* 91*  CO2 27 29  BUN 24* 50*  CREATININE 4.38* 7.10*  GLUCOSE 133* 169*  CALCIUM 8.2* 8.0*   No results for input(s): LABPT, INR in the last 72 hours.  Neurologically intact No results found.  Assessment/Plan: 3 Days Post-Op Procedure(s) (LRB): INTRAMEDULLARY (IM) RETROGRADE FEMORAL NAILING LEFT (Left) Up with therapy, SNF  [name] 07/08/2016, 7:43 AM

## 2016-07-09 ENCOUNTER — Inpatient Hospital Stay (HOSPITAL_COMMUNITY): Payer: Medicare (Managed Care)

## 2016-07-09 DIAGNOSIS — S72402A Unspecified fracture of lower end of left femur, initial encounter for closed fracture: Secondary | ICD-10-CM

## 2016-07-09 DIAGNOSIS — S92422P Displaced fracture of distal phalanx of left great toe, subsequent encounter for fracture with malunion: Secondary | ICD-10-CM

## 2016-07-09 DIAGNOSIS — Z9889 Other specified postprocedural states: Secondary | ICD-10-CM

## 2016-07-09 DIAGNOSIS — R071 Chest pain on breathing: Secondary | ICD-10-CM

## 2016-07-09 LAB — RENAL FUNCTION PANEL
ALBUMIN: 2.1 g/dL — AB (ref 3.5–5.0)
Anion gap: 13 (ref 5–15)
BUN: 33 mg/dL — AB (ref 6–20)
CHLORIDE: 93 mmol/L — AB (ref 101–111)
CO2: 27 mmol/L (ref 22–32)
CREATININE: 4.47 mg/dL — AB (ref 0.44–1.00)
Calcium: 8.3 mg/dL — ABNORMAL LOW (ref 8.9–10.3)
GFR calc Af Amer: 10 mL/min — ABNORMAL LOW (ref >60)
GFR, EST NON AFRICAN AMERICAN: 8 mL/min — AB (ref >60)
GLUCOSE: 110 mg/dL — AB (ref 65–99)
PHOSPHORUS: 3.7 mg/dL (ref 2.5–4.6)
Potassium: 4.1 mmol/L (ref 3.5–5.1)
Sodium: 133 mmol/L — ABNORMAL LOW (ref 135–145)

## 2016-07-09 LAB — GLUCOSE, CAPILLARY
GLUCOSE-CAPILLARY: 112 mg/dL — AB (ref 65–99)
GLUCOSE-CAPILLARY: 174 mg/dL — AB (ref 65–99)
Glucose-Capillary: 108 mg/dL — ABNORMAL HIGH (ref 65–99)
Glucose-Capillary: 119 mg/dL — ABNORMAL HIGH (ref 65–99)
Glucose-Capillary: 143 mg/dL — ABNORMAL HIGH (ref 65–99)
Glucose-Capillary: 159 mg/dL — ABNORMAL HIGH (ref 65–99)
Glucose-Capillary: 242 mg/dL — ABNORMAL HIGH (ref 65–99)

## 2016-07-09 LAB — HEMOGLOBIN A1C
Hgb A1c MFr Bld: 5.6 % (ref 4.8–5.6)
MEAN PLASMA GLUCOSE: 114 mg/dL

## 2016-07-09 LAB — CBC
HCT: 26.5 % — ABNORMAL LOW (ref 36.0–46.0)
Hemoglobin: 8.4 g/dL — ABNORMAL LOW (ref 12.0–15.0)
MCH: 28.5 pg (ref 26.0–34.0)
MCHC: 31.7 g/dL (ref 30.0–36.0)
MCV: 89.8 fL (ref 78.0–100.0)
PLATELETS: 178 10*3/uL (ref 150–400)
RBC: 2.95 MIL/uL — ABNORMAL LOW (ref 3.87–5.11)
RDW: 15.5 % (ref 11.5–15.5)
WBC: 7.7 10*3/uL (ref 4.0–10.5)

## 2016-07-09 MED ORDER — CARVEDILOL 6.25 MG PO TABS: 6.2500 mg | ORAL_TABLET | Freq: Two times a day (BID) | ORAL | Status: DC

## 2016-07-09 MED ORDER — ACETAMINOPHEN-CODEINE #3 300-30 MG PO TABS: 1.0000 | ORAL_TABLET | Freq: Four times a day (QID) | ORAL | 0 refills | Status: DC | PRN

## 2016-07-09 MED ORDER — CALCITRIOL 0.5 MCG PO CAPS: 0.5000 ug | ORAL_CAPSULE | ORAL | Status: DC

## 2016-07-09 MED ORDER — ENOXAPARIN SODIUM 40 MG/0.4ML ~~LOC~~ SOLN
40.0000 mg | Freq: Once | SUBCUTANEOUS | Status: AC
  Administered 2016-07-09: 40 mg via SUBCUTANEOUS
  Filled 2016-07-09: qty 0.4

## 2016-07-09 MED ORDER — AMIODARONE HCL 200 MG PO TABS: 200.0000 mg | ORAL_TABLET | Freq: Every day | ORAL | Status: DC

## 2016-07-09 MED ORDER — WARFARIN SODIUM 4 MG PO TABS
4.0000 mg | ORAL_TABLET | Freq: Once | ORAL | Status: AC
  Administered 2016-07-09: 4 mg via ORAL
  Filled 2016-07-09 (×2): qty 1

## 2016-07-09 MED ORDER — INSULIN GLARGINE 100 UNIT/ML ~~LOC~~ SOLN: 5.0000 [IU] | Freq: Every day | SUBCUTANEOUS | 11 refills | Status: DC

## 2016-07-09 MED ORDER — INSULIN ASPART 100 UNIT/ML ~~LOC~~ SOLN: 0.0000 [IU] | Freq: Three times a day (TID) | SUBCUTANEOUS | 11 refills | Status: DC

## 2016-07-09 MED ORDER — WARFARIN - PHARMACIST DOSING INPATIENT: Freq: Every day | Status: DC

## 2016-07-09 MED ORDER — RENA-VITE PO TABS: 1.0000 | ORAL_TABLET | Freq: Every day | ORAL | 0 refills | Status: DC

## 2016-07-09 MED ORDER — NEPRO/CARBSTEADY PO LIQD: 237.0000 mL | Freq: Two times a day (BID) | ORAL | 0 refills | Status: AC

## 2016-07-09 MED ORDER — ASPIRIN EC 81 MG PO TBEC: 81.0000 mg | DELAYED_RELEASE_TABLET | Freq: Every day | ORAL | Status: DC

## 2016-07-09 MED ORDER — ENOXAPARIN SODIUM 80 MG/0.8ML ~~LOC~~ SOLN: 70.0000 mg | SUBCUTANEOUS | Status: DC

## 2016-07-09 NOTE — Progress Notes (Signed)
Subjective: Doing well.  Pain controlled.     Objective: Vital signs in last 24 hours: Temp:  [97.9 F (36.6 C)-98.4 F (36.9 C)] 97.9 F (36.6 C) (04/11 0435) Pulse Rate:  [83-92] 83 (04/11 0900) Resp:  [18-20] 20 (04/11 0900) BP: (115-120)/(49-67) 119/67 (04/11 0900) SpO2:  [98 %-99 %] 99 % (04/11 0900)  Intake/Output from previous day: 04/10 0701 - 04/11 0700 In: 476 [P.O.:476] Out: 1000  Intake/Output this shift: Total I/O In: 236 [P.O.:236] Out: -    Recent Labs  07/07/16 0307 07/08/16 0638 07/09/16 0536  HGB 8.7* 8.5* 8.4*    Recent Labs  07/08/16 0638 07/09/16 0536  WBC 8.0 7.7  RBC 2.97* 2.95*  HCT 26.4* 26.5*  PLT 163 178    Recent Labs  07/08/16 0638 07/09/16 0536  NA 134* 133*  K 4.5 4.1  CL 91* 93*  CO2 29 27  BUN 71* 33*  CREATININE 8.62* 4.47*  GLUCOSE 146* 110*  CALCIUM 8.0* 8.3*   No results for input(s): LABPT, INR in the last 72 hours.  Exam:  Pleasant elderly female in NAD.  Wound looks good.  Staples intact.  Dressing changed.    Assessment/Plan: Continue present care.  Stable from ortho standpoint.    Benjiman Core 07/09/2016, 4:32 PM

## 2016-07-09 NOTE — Progress Notes (Signed)
S: Pain controlled.  Eating a little better. Lowish BP with HD yest and only removed 1 L O:BP (!) 120/57 (BP Location: Right Arm)   Pulse 86   Temp 97.9 F (36.6 C) (Oral)   Resp 18   Ht 5\' 6"  (1.676 m)   Wt 77.9 kg (171 lb 11.8 oz)   SpO2 98%   BMI 27.72 kg/m   Intake/Output Summary (Last 24 hours) at 07/09/16 0855 Last data filed at 07/08/16 2016  Gross per 24 hour  Intake              476 ml  Output             1000 ml  Net             -524 ml   Weight change:  KNL:ZJQBH and alert CVS:RRR Resp: few faint crackles Abd:+ BS NTND Ext:no edema on RLE, LLE in brace,  LUA AVF + bruit NEURO:CNI Ox3, no asterixis   . acetaminophen  500 mg Oral TID  . acetaminophen  650 mg Oral Once  . amiodarone  200 mg Oral Daily  . calcitRIOL  0.5 mcg Oral Q T,Th,Sa-HD  . carvedilol  6.25 mg Oral BID WC  . darbepoetin (ARANESP) injection - DIALYSIS  100 mcg Intravenous Q Tue-HD  . donepezil  5 mg Oral QHS  . enoxaparin (LOVENOX) injection  30 mg Subcutaneous Q24H  . erythromycin  1 application Both Eyes QHS  . feeding supplement (NEPRO CARB STEADY)  237 mL Oral BID BM  . ferrous sulfate  325 mg Oral BID  . gabapentin  300 mg Oral Daily  . insulin aspart  0-9 Units Subcutaneous Q4H  . insulin glargine  5 Units Subcutaneous QHS  . ketoconazole  1 application Topical Daily  . multivitamin  1 tablet Oral QHS  . polyvinyl alcohol  1 drop Both Eyes BID  . rOPINIRole  1 mg Oral BID  . rosuvastatin  5 mg Oral QHS  . sevelamer carbonate  800 mg Oral TID WC   Dg Chest 1 View  Result Date: 07/08/2016 CLINICAL DATA:  Chest pain today. EXAM: CHEST 1 VIEW COMPARISON:  Single-view of the chest 07/05/2016. PA and lateral chest 04/17/2014. FINDINGS: There is cardiomegaly without edema. No consolidative process, pneumothorax or effusion. The patient is status post CABG. IMPRESSION: Cardiomegaly without acute disease. Electronically Signed   By: Inge Rise M.D.   On: 07/08/2016 20:24   Nm  Pulmonary Perf And Vent  Result Date: 07/08/2016 CLINICAL DATA:  Femur surgery 2 days ago. Dialysis patient. Substernal chest pain. EXAM: NUCLEAR MEDICINE VENTILATION - PERFUSION LUNG SCAN TECHNIQUE: Ventilation images were obtained in multiple projections using inhaled aerosol Tc-34m DTPA. Perfusion images were obtained in multiple projections after intravenous injection of Tc-44m MAA. RADIOPHARMACEUTICALS:  30.5 mCi Technetium-73m DTPA aerosol inhalation and 4.3 mCi Technetium-59m MAA IV COMPARISON:  Chest radiography same day FINDINGS: Ventilation: No focal ventilation defect. Slight patchy ventilation which could relate to radiopharmaceutical distribution versus mild lung disease. Perfusion: No wedge shaped peripheral perfusion defects to suggest acute pulmonary embolism. IMPRESSION: No perfusion defects to suggest pulmonary emboli. Mild patchy perfusion pattern could relate to radiopharmaceutical distribution versus mild lung disease. Electronically Signed   By: Nelson Chimes M.D.   On: 07/08/2016 19:08   BMET    Component Value Date/Time   NA 133 (L) 07/09/2016 0536   NA 137 10/12/2013 1254   K 4.1 07/09/2016 0536   K 3.3 (L) 10/12/2013 1254  CL 93 (L) 07/09/2016 0536   CL 98 10/12/2013 1254   CO2 27 07/09/2016 0536   CO2 34 (H) 10/12/2013 1254   GLUCOSE 110 (H) 07/09/2016 0536   GLUCOSE 161 (H) 10/12/2013 1254   BUN 33 (H) 07/09/2016 0536   BUN 15 10/12/2013 1254   CREATININE 4.47 (H) 07/09/2016 0536   CREATININE 2.28 (H) 10/12/2013 1254   CALCIUM 8.3 (L) 07/09/2016 0536   CALCIUM 8.5 10/12/2013 1254   CALCIUM 8.6 01/02/2013 1036   GFRNONAA 8 (L) 07/09/2016 0536   GFRNONAA 20 (L) 10/12/2013 1254   GFRAA 10 (L) 07/09/2016 0536   GFRAA 23 (L) 10/12/2013 1254   CBC    Component Value Date/Time   WBC 7.7 07/09/2016 0536   RBC 2.95 (L) 07/09/2016 0536   HGB 8.4 (L) 07/09/2016 0536   HGB 10.3 (L) 10/12/2013 1254   HCT 26.5 (L) 07/09/2016 0536   HCT 32.0 (L) 10/12/2013 1254    PLT 178 07/09/2016 0536   PLT 234 10/12/2013 1254   MCV 89.8 07/09/2016 0536   MCV 91 10/12/2013 1254   MCH 28.5 07/09/2016 0536   MCHC 31.7 07/09/2016 0536   RDW 15.5 07/09/2016 0536   RDW 15.2 (H) 10/12/2013 1254   LYMPHSABS 1.7 07/05/2016 0551   MONOABS 1.6 (H) 07/05/2016 0551   EOSABS 0.0 07/05/2016 0551   BASOSABS 0.0 07/05/2016 0551     Assessment:  1. Lt distal femur fx  SP ORIF 2. ESRD TTS [place] 3. Anemia  4. Sec HPTH on Vit D 5. DM  Plan: 1.  HD tomorrow 2. Note plans for SNF    Raiford Fetterman T

## 2016-07-09 NOTE — Progress Notes (Signed)
OT Cancellation    07/09/16 1410  OT Visit Information  Last OT Received On 07/09/16  Reason Eval/Treat Not Completed Patient at procedure or test/ unavailable (Pt at doppler test. Will check back as time allows.)   [facility], OTR/L [phone]

## 2016-07-09 NOTE — Progress Notes (Signed)
*  PRELIMINARY RESULTS* Vascular Ultrasound Bilateral lower extremity venosu duplex has been completed.  Preliminary findings: The right lower extremity is positive for a small segment of deep vein thrombosis in the proximal gastrocnemius vein, other visualized veins appear negative. Limited visualization of the left lower extremity due to bandages and patient mobility, visualized veins appear negative for deep vein thrombosis.  Preliminary results called to [place] at 14:30.  Everrett Coombe 07/09/2016, 2:40 PM

## 2016-07-09 NOTE — Progress Notes (Signed)
[facility]                                                                              Patient Demographics  [name], is a 83 y.o. female, DOB - 06-Nov-1930, LOV:[id number]  Admit date - 07/03/2016   Admitting Physician Orvan Falconer, MD  Outpatient Primary MD for the patient is Jani Gravel, MD  Outpatient specialists:   LOS - 6  days    Chief Complaint  Patient presents with  . Fall       Brief summary  For complete details please refer to admission H and P, but in brief Stacey D Mullinsis an 83 y.o.femalewith hx of dementia, mild DM, ataxia and fall risks, ESRD on HD (T Th Sat), HLD, HTN, fell at home without loss of consciousness, presented to the ER with left leg pain. Work up in the ER included an X ray of the femur showing comminuted distal femur Fx with some angulation, Xray of the left foot showed toe and metatarsal Fx, LS spine film showed compressive Fx of undetermined age, no Fx of the left pelvis, and CXR showed cardiomegaly and no infiltrate. Her UA was negative, and lab work is still pending. Orthopedics was consulted, and Dr Lorin Mercy planned to perform ORIF of the left femur, and foot did not require surgical intervention.      Assessment & Plan   Left distal femur fracture comminuted Secondary to mechanical fall. Patient was seen by orthopedics and status post ORIF left closed supracondylar distal femur fracture with intracondylar extension per Dr. Lorin Mercy 07/05/2016.  PT evaluation recommended skilled nursing facility  left foot fracture Per orthopedics no surgical intervention at this time. Pain management. Supportive care. Per orthopedics.  postop chest pain/ MAT/tachycardia Postoperatively patient was noted in the recovery room 12 complaints of left-sided chest pain noted also to be tachycardic. Cardiology was consulted as patient with significant coronary artery disease history. Patient also noted to be anemic with a hemoglobin of 7.1  postprocedure. EKG done showed MAT. Patient given IV metoprolol with improvement with tachycardia. Cardiology recommended serial cardiac enzymes which were minimally elevated. TSH was within normal limits at 1.703. Continue Coreg.  2-D echo showed EF of 65-70% with grade 1 diastolic dysfunction. Cardiology recommended starting beta blocker and amiodarone. D-dimer was elevated hence a VQ scan was done which was negative for any PE.   end-stage renal disease on hemodialysis Patient has a seen in consultation by nephrology. Patient on Tuesday Thursday Saturday schedule.     diabetes mellitus Patient with CBGs in the 142-237. Discontinued oral Amaryl and Tradjenta. Continue Lantus 5 units daily. Sliding scale insulin.   hyperlipidemia Continue statin.   mild dementia Stable. Continue Aricept.  postop acute blood loss anemia Patient was given 2 units packed red blood cells in the PACU to maintain hemoglobin greater than 8-8.5 as patient noted with complaints of chest pain postoperatively and noted to be tachycardic.  postop fever Blood cultures with no growth to date. Chest x-ray negative for any acute abnormalities. fevers resolved.   RLE DVT - Doppler LE showed positive for a small segment of deep vein thrombosis  in the proximal gastrocnemius vein - will place on coumadin for 3 months and change to therapeutic lovenox until INR >2.    Code Status: full  DVT Prophylaxis:  Lovenox  Family Communication: Discussed in detail with the patient, all imaging results, lab results explained to the patient    Disposition Plan: likely in am   Time Spent in minutes  25  minutes  Procedures:  HD  ORIF left closed supracondylar distal femur fracture with intracondylar extension. Biomet retrograde nail with interlocks proximal and distal. 6.5 cannulated screws  Consultants:   Renal  Ortho   Antimicrobials:      Medications  Scheduled Meds: . acetaminophen  500 mg Oral TID    . acetaminophen  650 mg Oral Once  . amiodarone  200 mg Oral Daily  . calcitRIOL  0.5 mcg Oral Q T,Th,Sa-HD  . carvedilol  6.25 mg Oral BID WC  . darbepoetin (ARANESP) injection - DIALYSIS  100 mcg Intravenous Q Tue-HD  . donepezil  5 mg Oral QHS  . enoxaparin (LOVENOX) injection  30 mg Subcutaneous Q24H  . erythromycin  1 application Both Eyes QHS  . feeding supplement (NEPRO CARB STEADY)  237 mL Oral BID BM  . ferrous sulfate  325 mg Oral BID  . gabapentin  300 mg Oral Daily  . insulin aspart  0-9 Units Subcutaneous Q4H  . insulin glargine  5 Units Subcutaneous QHS  . ketoconazole  1 application Topical Daily  . multivitamin  1 tablet Oral QHS  . polyvinyl alcohol  1 drop Both Eyes BID  . rOPINIRole  1 mg Oral BID  . rosuvastatin  5 mg Oral QHS  . sevelamer carbonate  800 mg Oral TID WC   Continuous Infusions: . sodium chloride 10 mL/hr at 07/04/16 1747   PRN Meds:.acetaminophen-codeine, HYDROcodone-acetaminophen, loperamide, menthol-cetylpyridinium, ondansetron, technetium albumin aggregated, technetium TC 66M diethylenetriame-pentaacetic acid   Antibiotics   Anti-infectives    Start     Dose/Rate Route Frequency Ordered Stop   07/05/16 0715  ceFAZolin (ANCEF) IVPB 2g/100 mL premix     2 g 200 mL/hr over 30 Minutes Intravenous  Once 07/05/16 0713 07/05/16 0755   07/05/16 0714  ceFAZolin (ANCEF) 2-4 GM/100ML-% IVPB    Comments:  Henrine Screws   : cabinet override      07/05/16 0714 07/05/16 0745   07/04/16 1230  ceFAZolin (ANCEF) IVPB 2g/100 mL premix  Status:  Discontinued     2 g 200 mL/hr over 30 Minutes Intravenous To [facility] 07/04/16 1148 07/04/16 1830   07/04/16 1200  ceFAZolin (ANCEF) IVPB 2g/100 mL premix  Status:  Discontinued     2 g 200 mL/hr over 30 Minutes Intravenous On call to O.R. 07/04/16 1148 07/04/16 1200        Subjective:   [name] was seen and examined today. Hip Pain controlled. No Chest pain. Patient denies dizziness,  abdominal pain, N/V/D/C, new weakness, numbess, tingling. No acute events overnight.    Objective:   Vitals:   07/08/16 1625 07/08/16 2209 07/09/16 0435 07/09/16 0900  BP: (!) 130/59 (!) 115/49 (!) 120/57 119/67  Pulse: 78 92 86 83  Resp: 20 18 18 20   Temp: 98 F (36.7 C) 98.4 F (36.9 C) 97.9 F (36.6 C)   TempSrc: Oral Oral Oral   SpO2: 96% 98% 98% 99%  Weight: 77.9 kg (171 lb 11.8 oz)     Height:        Intake/Output Summary (Last 24 hours)  at 07/09/16 1504 Last data filed at 07/08/16 2016  Gross per 24 hour  Intake              240 ml  Output             1000 ml  Net             -760 ml     Wt Readings from Last 3 Encounters:  07/08/16 77.9 kg (171 lb 11.8 oz)  11/09/13 75.4 kg (166 lb 3.2 oz)  10/22/13 77.1 kg (170 lb)     Exam  General: Alert and oriented x 3, NAD  HEENT:     Neck: Supple, no JVD  Cardiovascular: S1 S2 auscultated, 2/6 SEM. Regular rate and rhythm.  Respiratory: Clear to auscultation bilaterally, no wheezing, rales or rhonchi  Gastrointestinal: Soft, nontender, nondistended, + bowel sounds  Ext: no cyanosis clubbing or edema  Neuro: no new deficits   Skin: No rashes  Psych: Normal affect and demeanor, alert and oriented x3    Data Reviewed:  I have personally reviewed following labs and imaging studies  Micro Results Recent Results (from the past 240 hour(s))  Surgical PCR screen     Status: None   Collection Time: 07/04/16  3:18 AM  Result Value Ref Range Status   MRSA, PCR NEGATIVE NEGATIVE Final   Staphylococcus aureus NEGATIVE NEGATIVE Final    Comment:        The Xpert SA Assay (FDA approved for NASAL specimens in patients over 45 years of age), is one component of a comprehensive surveillance program.  Test performance has been validated by [facility] for patients greater than or equal to 61 year old. It is not intended to diagnose infection nor to guide or monitor treatment.   Culture, blood (Routine X 2) w  Reflex to ID Panel     Status: None (Preliminary result)   Collection Time: 07/06/16  9:22 AM  Result Value Ref Range Status   Specimen Description BLOOD RIGHT HAND  Final   Special Requests IN PEDIATRIC BOTTLE Blood Culture adequate volume  Final   Culture NO GROWTH 3 DAYS  Final   Report Status PENDING  Incomplete  Culture, blood (Routine X 2) w Reflex to ID Panel     Status: None (Preliminary result)   Collection Time: 07/06/16  9:32 AM  Result Value Ref Range Status   Specimen Description BLOOD RIGHT HAND  Final   Special Requests IN PEDIATRIC BOTTLE Blood Culture adequate volume  Final   Culture NO GROWTH 3 DAYS  Final   Report Status PENDING  Incomplete    Radiology Reports Dg Chest 1 View  Result Date: 07/08/2016 CLINICAL DATA:  Chest pain today. EXAM: CHEST 1 VIEW COMPARISON:  Single-view of the chest 07/05/2016. PA and lateral chest 04/17/2014. FINDINGS: There is cardiomegaly without edema. No consolidative process, pneumothorax or effusion. The patient is status post CABG. IMPRESSION: Cardiomegaly without acute disease. Electronically Signed   By: Inge Rise M.D.   On: 07/08/2016 20:24   Dg Lumbar Spine Complete  Result Date: 07/03/2016 CLINICAL DATA:  PT states she was getting out of the car after her dialysis treatment today and tripped over her rolling walker and landed onto her left leg and buttocks. PT c/o pain to buttocks and down left leg since fall. EXAM: LUMBAR SPINE - COMPLETE 4+ VIEW COMPARISON:  04/20/2008 FINDINGS: There is a mild compression fracture of L4 with depression of the upper endplate, new since  the prior radiographs. No other fractures. Slight, approximately 3 mm, anterolisthesis of L4 on L5, and mild retrolisthesis of L2 on L3 also new since the prior exam. No other spondylolisthesis. Moderate loss of disc height with endplate sclerosis and osteophytes at L2-L3, increased in severity from the prior exam. Moderate loss disc height at L3-L4. Moderate to  severe loss of disc height at L4-L5 and moderate loss disc height at L5-S1. Loss of disc height has increased at L3-L4 and L4-L5. Bones are diffusely demineralized. There are dense vascular calcifications. Calcification in the central pelvis is consistent with calcified fibroid, similar to the prior exam. There has been a prior cholecystectomy. IMPRESSION: 1. Mild depression fracture of L4, of unclear chronicity, but new since the prior lumbar spine radiographs. 2. No other fractures. 3. Degenerative changes as detailed which have progressed since the prior study. Electronically Signed   By: Lajean Manes M.D.   On: 07/03/2016 19:23   Dg Ankle Complete Left  Result Date: 07/03/2016 CLINICAL DATA:  PT states she was getting out of the car after her dialysis treatment today and tripped over her rolling walker and landed onto her left leg and buttocks. PT c/o pain to buttocks and down left leg since fall. EXAM: LEFT ANKLE COMPLETE - 3+ VIEW COMPARISON:  None. FINDINGS: No fracture.  No bone lesion. The ankle mortise is normally spaced and aligned. There are plantar and dorsal calcaneal spurs. Bones are diffusely demineralized. There is mild nonspecific subcutaneous soft tissue edema and dense vascular calcifications. IMPRESSION: No fracture, dislocation or acute finding. Electronically Signed   By: Lajean Manes M.D.   On: 07/03/2016 19:28   Nm Pulmonary Perf And Vent  Result Date: 07/08/2016 CLINICAL DATA:  Femur surgery 2 days ago. Dialysis patient. Substernal chest pain. EXAM: NUCLEAR MEDICINE VENTILATION - PERFUSION LUNG SCAN TECHNIQUE: Ventilation images were obtained in multiple projections using inhaled aerosol Tc-76m DTPA. Perfusion images were obtained in multiple projections after intravenous injection of Tc-30m MAA. RADIOPHARMACEUTICALS:  30.5 mCi Technetium-61m DTPA aerosol inhalation and 4.3 mCi Technetium-56m MAA IV COMPARISON:  Chest radiography same day FINDINGS: Ventilation: No focal ventilation  defect. Slight patchy ventilation which could relate to radiopharmaceutical distribution versus mild lung disease. Perfusion: No wedge shaped peripheral perfusion defects to suggest acute pulmonary embolism. IMPRESSION: No perfusion defects to suggest pulmonary emboli. Mild patchy perfusion pattern could relate to radiopharmaceutical distribution versus mild lung disease. Electronically Signed   By: Nelson Chimes M.D.   On: 07/08/2016 19:08   Dg Chest Port 1 View  Result Date: 07/05/2016 CLINICAL DATA:  Chest pain which has resolved but complaint of shakiness EXAM: PORTABLE CHEST 1 VIEW COMPARISON:  Two days ago FINDINGS: Chronic cardiomegaly. Status post CABG. Mildly low lung volumes with interstitial crowding. There is no edema, consolidation, effusion, or pneumothorax. Cholecystectomy clips. IMPRESSION: No evidence of active disease. Electronically Signed   By: Monte Fantasia M.D.   On: 07/05/2016 14:51   Dg Chest Port 1 View  Result Date: 07/03/2016 CLINICAL DATA:  Femur fracture.  History of hypertension. EXAM: PORTABLE CHEST 1 VIEW COMPARISON:  Chest radiograph April 17, 2014 FINDINGS: Cardiac silhouette is mildly enlarged, mediastinal silhouette is unremarkable for this low inspiratory examination with crowded vasculature markings. Calcified aortic knob. Similar fullness of the hila compatible with vascular structures. Status post median sternotomy for CABG. The lungs are clear without pleural effusions or focal consolidations. Trachea projects midline and there is no pneumothorax. Included soft tissue planes and osseous structures are non-suspicious. Surgical  clips in the included right abdomen compatible with cholecystectomy. IMPRESSION: Mild cardiomegaly without acute pulmonary process in this low inspiratory portable examination. Electronically Signed   By: Elon Alas M.D.   On: 07/03/2016 20:19   Dg Knee Complete 4 Views Left  Result Date: 07/03/2016 CLINICAL DATA:  PT states she was  getting out of the car after her dialysis treatment today and tripped over her rolling walker and landed onto her left leg and buttocks. PT c/o pain to buttocks and down left leg since fall. EXAM: LEFT KNEE - COMPLETE 4+ VIEW COMPARISON:  None. FINDINGS: There is a comminuted fracture of the distal femur. There is a transverse fracture across the proximal metaphysis. There are comminuted fracture components along the lateral margin of the fracture. Fracture is mildly impacted by approximately 1.5 cm. The fracture shows posterior and lateral displacement by just over 1 cm. There is also anterior angulation of the distal fracture component of approximately 20 degrees. There are no other fractures. The bones are diffusely demineralized. The knee joint is normally aligned. There is tricompartmental joint space narrowing with marginal osteophytes consistent with osteoarthritis. No significant joint effusion is noted. There is surrounding soft tissue edema and dense vascular calcifications. IMPRESSION: Comminuted, mildly displaced and impacted and angulated fracture of the distal left femur at the proximal metaphysis. No dislocation. Electronically Signed   By: Lajean Manes M.D.   On: 07/03/2016 19:27   Dg Foot Complete Left  Result Date: 07/03/2016 CLINICAL DATA:  Patient tripped and fell.  Pain. EXAM: LEFT FOOT - COMPLETE 3+ VIEW COMPARISON:  None. FINDINGS: Bones are demineralized. Degenerative changes are seen at the midfoot. Linear lucencies through the proximal aspect of the second, third, and fourth metatarsals is consistent with fracture. Cortical over had with linear lucency in the neck of the second metatarsal noted. Oblique fracture through the base of the great toe distal phalanx appears nonacute. Dense vascular calcifications suggest diabetes. IMPRESSION: 1. Transverse fractures through the proximal second, third, and fourth metatarsals. 2. Question fracture in the second metatarsal neck. 3. Oblique  fracture through the base of the great toe distal phalanx appears nonacute. Electronically Signed   By: Misty Stanley M.D.   On: 07/03/2016 19:25   Dg C-arm 1-60 Min  Result Date: 07/05/2016 CLINICAL DATA:  Intramedullary nail. EXAM: DG C-ARM 61-120 MIN; LEFT FEMUR 2 VIEWS COMPARISON:  Radiography 07/03/2016 FINDINGS: Fluoroscopy for retrograde intramedullary femoral nail with multiple distal interlocking screws. Especially in the coronal plane, distal femoral metaphysis fracture has significantly improved alignment. No evidence of intraoperative fracture. IMPRESSION: Fluoroscopy for ORIF of distal femur fracture. No unexpected finding. Electronically Signed   By: Monte Fantasia M.D.   On: 07/05/2016 09:49   Dg Hip Unilat With Pelvis 2-3 Views Left  Result Date: 07/03/2016 CLINICAL DATA:  PT states she was getting out of the car after her dialysis treatment today and tripped over her rolling walker and landed onto her left leg and buttocks. PT c/o pain to buttocks and down left leg since fall. EXAM: DG HIP (WITH OR WITHOUT PELVIS) 2-3V LEFT COMPARISON:  None. FINDINGS: No fracture or dislocation.  No bone lesion. Mild concentric hip joint space narrowing, left greater than right. The SI joints and symphysis pubis are normally aligned. Bones are diffusely demineralized. Densely calcified fibroid in the central pelvis. There is dense arterial vascular calcification. IMPRESSION: 1. No fracture, dislocation or acute finding. Electronically Signed   By: Lajean Manes M.D.   On: 07/03/2016 19:29  Dg Femur Min 2 Views Left  Result Date: 07/05/2016 CLINICAL DATA:  Intramedullary nail. EXAM: DG C-ARM 61-120 MIN; LEFT FEMUR 2 VIEWS COMPARISON:  Radiography 07/03/2016 FINDINGS: Fluoroscopy for retrograde intramedullary femoral nail with multiple distal interlocking screws. Especially in the coronal plane, distal femoral metaphysis fracture has significantly improved alignment. No evidence of intraoperative fracture.  IMPRESSION: Fluoroscopy for ORIF of distal femur fracture. No unexpected finding. Electronically Signed   By: Monte Fantasia M.D.   On: 07/05/2016 09:49    Lab Data:  CBC:  Recent Labs Lab 07/03/16 2039 07/04/16 0944 07/05/16 0551  07/05/16 1802 07/06/16 0214 07/07/16 0307 07/08/16 0638 07/09/16 0536  WBC 11.0* 11.6* 13.3*  --  11.0* 12.0* 9.8 8.0 7.7  NEUTROABS 9.0* 9.1* 10.0*  --   --   --   --   --   --   HGB 10.5* 9.9* 7.6*  < > 9.6* 9.5* 8.7* 8.5* 8.4*  HCT 32.4* 31.2* 24.4*  < > 28.9* 29.6* 26.0* 26.4* 26.5*  MCV 90.3 91.8 91.4  --  88.9 88.6 89.0 88.9 89.8  PLT 185 183 202  --  140* 147* 144* 163 178  < > = values in this interval not displayed. Basic Metabolic Panel:  Recent Labs Lab 07/05/16 0551  07/05/16 1004 07/05/16 1802 07/06/16 0019 07/07/16 0307 07/08/16 0638 07/09/16 0536  NA 135  < > 138  --  135 131* 134* 133*  K 4.5  < > 3.7  --  3.8 4.8 4.5 4.1  CL 90*  --   --   --  94* 91* 91* 93*  CO2 30  --   --   --  27 29 29 27   GLUCOSE 163*  < > 159*  --  133* 169* 146* 110*  BUN 40*  --   --   --  24* 50* 71* 33*  CREATININE 6.94*  --   --  7.61* 4.38* 7.10* 8.62* 4.47*  CALCIUM 8.4*  --   --   --  8.2* 8.0* 8.0* 8.3*  MG  --   --   --  1.9  --   --   --   --   PHOS 5.7*  --   --   --  3.6 5.8* 5.3* 3.7  < > = values in this interval not displayed. GFR: Estimated Creatinine Clearance: 10 mL/min (A) (by C-G formula based on SCr of 4.47 mg/dL (H)). Liver Function Tests:  Recent Labs Lab 07/05/16 0551 07/06/16 0019 07/07/16 0307 07/08/16 4332 07/09/16 0536  ALBUMIN 2.4* 2.8* 2.1* 2.2* 2.1*   No results for input(s): LIPASE, AMYLASE in the last 168 hours. No results for input(s): AMMONIA in the last 168 hours. Coagulation Profile: No results for input(s): INR, PROTIME in the last 168 hours. Cardiac Enzymes:  Recent Labs Lab 07/03/16 2039 07/05/16 1115 07/05/16 1802  CKTOTAL  --  3,140*  --   CKMB  --  15.5*  --   TROPONINI <0.03 0.03*  0.06*   BNP (last 3 results) No results for input(s): PROBNP in the last 8760 hours. HbA1C:  Recent Labs  07/08/16 0638  HGBA1C 5.6   CBG:  Recent Labs Lab 07/08/16 2136 07/09/16 0129 07/09/16 0355 07/09/16 0805 07/09/16 1209  GLUCAP 173* 112* 108* 119* 174*   Lipid Profile:  Recent Labs  07/08/16 1934  CHOL 95  HDL 27*  LDLCALC 37  TRIG 153*  CHOLHDL 3.5   Thyroid Function Tests: No results for input(s): TSH,  T4TOTAL, FREET4, T3FREE, THYROIDAB in the last 72 hours. Anemia Panel: No results for input(s): VITAMINB12, FOLATE, FERRITIN, TIBC, IRON, RETICCTPCT in the last 72 hours. Urine analysis:    Component Value Date/Time   COLORURINE Yellow 10/12/2013 1254   COLORURINE YELLOW 08/24/2013 1454   APPEARANCEUR Clear 10/12/2013 1254   LABSPEC 1.004 10/12/2013 1254   PHURINE 6.0 10/12/2013 1254   PHURINE 5.5 08/24/2013 1454   GLUCOSEU Negative 10/12/2013 1254   HGBUR 1+ 10/12/2013 1254   HGBUR NEGATIVE 08/24/2013 1454   BILIRUBINUR Negative 10/12/2013 1254   KETONESUR Negative 10/12/2013 1254   KETONESUR NEGATIVE 08/24/2013 1454   PROTEINUR 100 mg/dL 10/12/2013 1254   PROTEINUR NEGATIVE 08/24/2013 1454   UROBILINOGEN 0.2 08/24/2013 1454   NITRITE Negative 10/12/2013 1254   NITRITE NEGATIVE 08/24/2013 1454   LEUKOCYTESUR Negative 10/12/2013 1254     Ripudeep Rai M.D. [facility] 07/09/2016, 3:04 PM  Pager: [phone] Between 7am to 7pm - call Pager - [phone]  After 7pm go to [email or web address] - password [email or web address]  Call night coverage person covering after 7pm

## 2016-07-09 NOTE — Progress Notes (Signed)
07/09/16 1500  Per vascular tech patient is positive for a small blood clot in rt leg. Dr Tana Coast notified.

## 2016-07-09 NOTE — Progress Notes (Signed)
Patient Name: [name] Date of Encounter: 07/09/2016  Primary Cardiologist: Prior patient of Dr. Meredith Mody Problem List     Active Problems:   Nondisplaced fracture of second metatarsal bone, left foot, initial encounter for closed fracture   Closed displaced fracture of distal phalanx of left great toe   ESRD on hemodialysis ([place])   Displaced supracondylar fracture with intracondylar extension of lower end of right femur, initial encounter for closed fracture ([place])   CAD (coronary artery disease), native coronary artery   Chest pain   Closed fracture of left distal femur (HCC)   Closed nondisplaced fracture of fourth metatarsal bone of left foot   Closed nondisplaced fracture of third metatarsal bone of left foot   Multifocal atrial tachycardia ([place])     Subjective    Lying in bed with no complaints  Inpatient Medications    Scheduled Meds: . acetaminophen  500 mg Oral TID  . acetaminophen  650 mg Oral Once  . amiodarone  200 mg Oral Daily  . calcitRIOL  0.5 mcg Oral Q T,Th,Sa-HD  . carvedilol  6.25 mg Oral BID WC  . darbepoetin (ARANESP) injection - DIALYSIS  100 mcg Intravenous Q Tue-HD  . donepezil  5 mg Oral QHS  . enoxaparin (LOVENOX) injection  30 mg Subcutaneous Q24H  . erythromycin  1 application Both Eyes QHS  . feeding supplement (NEPRO CARB STEADY)  237 mL Oral BID BM  . ferrous sulfate  325 mg Oral BID  . gabapentin  300 mg Oral Daily  . insulin aspart  0-9 Units Subcutaneous Q4H  . insulin glargine  5 Units Subcutaneous QHS  . ketoconazole  1 application Topical Daily  . multivitamin  1 tablet Oral QHS  . polyvinyl alcohol  1 drop Both Eyes BID  . rOPINIRole  1 mg Oral BID  . rosuvastatin  5 mg Oral QHS  . sevelamer carbonate  800 mg Oral TID WC   Continuous Infusions: . sodium chloride 10 mL/hr at 07/04/16 1747   PRN Meds: acetaminophen-codeine, HYDROcodone-acetaminophen, loperamide, menthol-cetylpyridinium, ondansetron, technetium  albumin aggregated, technetium TC 55M diethylenetriame-pentaacetic acid   Vital Signs    Vitals:   07/08/16 1600 07/08/16 1625 07/08/16 2209 07/09/16 0435  BP: 120/60 (!) 130/59 (!) 115/49 (!) 120/57  Pulse: 70 78 92 86  Resp:  20 18 18   Temp:  98 F (36.7 C) 98.4 F (36.9 C) 97.9 F (36.6 C)  TempSrc:  Oral Oral Oral  SpO2:  96% 98% 98%  Weight:  171 lb 11.8 oz (77.9 kg)    Height:        Intake/Output Summary (Last 24 hours) at 07/09/16 0748 Last data filed at 07/08/16 2016  Gross per 24 hour  Intake              476 ml  Output             1000 ml  Net             -524 ml   Filed Weights   07/06/16 0115 07/08/16 1310 07/08/16 1625  Weight: 168 lb 6.9 oz (76.4 kg) 173 lb 15.1 oz (78.9 kg) 171 lb 11.8 oz (77.9 kg)    Physical Exam    GEN: WN WD in NAD HEENT: Benign Neck: no JVD or bruit Cardiac: RRR with no M/R/G Respiratory:  Clear to auscultation bilaterally GI: soft, NT, ND MS: no deformity or edema Skin: warm and dry Neuro:  A&O x  3 Psych:normal  Labs    CBC  Recent Labs  07/08/16 0638 07/09/16 0536  WBC 8.0 7.7  HGB 8.5* 8.4*  HCT 26.4* 26.5*  MCV 88.9 89.8  PLT 163 073   Basic Metabolic Panel  Recent Labs  07/08/16 0638 07/09/16 0536  NA 134* 133*  K 4.5 4.1  CL 91* 93*  CO2 29 27  GLUCOSE 146* 110*  BUN 71* 33*  CREATININE 8.62* 4.47*  CALCIUM 8.0* 8.3*  PHOS 5.3* 3.7   Liver Function Tests  Recent Labs  07/08/16 0638 07/09/16 0536  ALBUMIN 2.2* 2.1*   No results for input(s): LIPASE, AMYLASE in the last 72 hours. Cardiac Enzymes No results for input(s): CKTOTAL, CKMB, CKMBINDEX, TROPONINI in the last 72 hours. BNP Invalid input(s): POCBNP D-Dimer  Recent Labs  07/08/16 0920  DDIMER >20.00*   Hemoglobin A1C  Recent Labs  07/08/16 0638  HGBA1C 5.6   Fasting Lipid Panel  Recent Labs  07/08/16 1934  CHOL 95  HDL 27*  LDLCALC 37  TRIG 153*  CHOLHDL 3.5   Thyroid Function Tests No results for input(s):  TSH, T4TOTAL, T3FREE, THYROIDAB in the last 72 hours.  Invalid input(s): FREET3  Telemetry    NSR wit frequent bursts of nonsustained atrial tachycardia and PACs- Personally Reviewed  ECG    NSR - Personally Reviewed  Radiology    Dg Chest 1 View  Result Date: 07/08/2016 CLINICAL DATA:  Chest pain today. EXAM: CHEST 1 VIEW COMPARISON:  Single-view of the chest 07/05/2016. PA and lateral chest 04/17/2014. FINDINGS: There is cardiomegaly without edema. No consolidative process, pneumothorax or effusion. The patient is status post CABG. IMPRESSION: Cardiomegaly without acute disease. Electronically Signed   By: Inge Rise M.D.   On: 07/08/2016 20:24   Nm Pulmonary Perf And Vent  Result Date: 07/08/2016 CLINICAL DATA:  Femur surgery 2 days ago. Dialysis patient. Substernal chest pain. EXAM: NUCLEAR MEDICINE VENTILATION - PERFUSION LUNG SCAN TECHNIQUE: Ventilation images were obtained in multiple projections using inhaled aerosol Tc-59m DTPA. Perfusion images were obtained in multiple projections after intravenous injection of Tc-49m MAA. RADIOPHARMACEUTICALS:  30.5 mCi Technetium-25m DTPA aerosol inhalation and 4.3 mCi Technetium-18m MAA IV COMPARISON:  Chest radiography same day FINDINGS: Ventilation: No focal ventilation defect. Slight patchy ventilation which could relate to radiopharmaceutical distribution versus mild lung disease. Perfusion: No wedge shaped peripheral perfusion defects to suggest acute pulmonary embolism. IMPRESSION: No perfusion defects to suggest pulmonary emboli. Mild patchy perfusion pattern could relate to radiopharmaceutical distribution versus mild lung disease. Electronically Signed   By: Nelson Chimes M.D.   On: 07/08/2016 19:08    Cardiac Studies   2D echo 07/07/2016 Study Conclusions  - Left ventricle: The cavity size was normal. There was mild   concentric hypertrophy. Systolic function was vigorous. The   estimated ejection fraction was in the range of  65% to 70%. Wall   motion was normal; there were no regional wall motion   abnormalities. Doppler parameters are consistent with abnormal   left ventricular relaxation (grade 1 diastolic dysfunction).   There was no evidence of elevated ventricular filling pressure by   Doppler parameters. - Aortic valve: Trileaflet; mildly thickened, mildly calcified   leaflets. Mean gradient (S): 8 mm Hg. Peak gradient (S): 13 mm   Hg. Valve area (VTI): 1.92 cm^2. Valve area (Vmax): 1.91 cm^2.   Valve area (Vmean): 1.51 cm^2. - Mitral valve: Mildly thickened leaflets and moderately calcified   chordae. - Left atrium: The atrium  was normal in size. - Right ventricle: The cavity size was mildly dilated. Wall   thickness was normal. Systolic function was normal. - Tricuspid valve: There was moderate regurgitation. - Pulmonary arteries: Systolic pressure was moderately increased.   PA peak pressure: 55 mm Hg (S). - Inferior vena cava: The vessel was dilated. The respirophasic   diameter changes were blunted (< 50%), consistent with elevated   central venous pressure.  Patient Profile     This is an 83 y.o. female with  a h/o CAD s/p 5 vessel CABG in 12/2006, reported PAF in 2015 post procedure not on long term full-dose anticoagulation, ESRD on HD (TTS), dementia, multiple mechanical falls, DM, PAD, anemia of chronic disease, HTN, HLD, obesity, mild aortic stenosis, moderate pulmonary HTN with PASP 57mmHg and OA who [facility] on [facility] with mechanical fall leading to the displaced angulated impacted left suprandylar femur fracture fracture. She underwent successful ORIF on 4/7. In the recovery room she noted left sided chest pain. Initial troponin 0.03. EKG with MAT. Cardiology asked to evaluate.   Assessment & Plan    1. Chest pain/CAD s/p remote CABG  -Brief episode of CP in PACU lasting approximately 15 minutes before self resolving.  She has not had any further episodes since that occurrence. -  Initial troponin 0.03 > 0.6. CPK was elevated at 3140 with MB 15.5 and RI of 0.5 c/w skeletal muscle trauma from fall and not ACS. - 2D echo showed normal LVF with EF 65-70% with no RWMAs and therefore no further ischemic workup recommended at this time - restart ASA if ok with ortho - continue Beta blocker and statin. - RV mildly dilated on echo but normal RVF.  She has moderate pulmonary HTN with PASP 38mmHg similar to echo findings 2015 except that now RV mildly dilated.  D-dimer > 20 but VQ scan negative for PE  2. PAT - likely related to her acute trauma/ABL anemia - Asymptomatic - Will continue beta blocker - cannot increase further due to soft BP - TSH normal - started low dose Amio 200mg  daily to suppress frequent PAT and prevent progression to afib. - follow daily EKG for QTc - EKG pending this am  3. ESRD on HD: - Dialysis TTS - Per nephrology  4. Closed displaced angulated left femur fracture: - S/p ORIF - Per ortho  5. ABL anemia/anemia of chronic disease: - Maintain HGB > 8-8.5>8.4 today  6. Dementia  Signed, Fransico Him, MD  07/09/2016, 7:48 AM

## 2016-07-09 NOTE — Progress Notes (Signed)
[place] for Lovenox and warfarin Indication: DVT  No Known Allergies  Patient Measurements: Height: 5\' 6"  (167.6 cm) Weight: 171 lb 11.8 oz (77.9 kg) IBW/kg (Calculated) : 59.3  Vital Signs: Temp: 97.9 F (36.6 C) (04/11 0435) Temp Source: Oral (04/11 0435) BP: 119/67 (04/11 0900) Pulse Rate: 83 (04/11 0900)  Labs:  Recent Labs  07/07/16 0307 07/08/16 3832 07/09/16 0536  HGB 8.7* 8.5* 8.4*  HCT 26.0* 26.4* 26.5*  PLT 144* 163 178  CREATININE 7.10* 8.62* 4.47*    Estimated Creatinine Clearance: 10 mL/min (A) (by C-G formula based on SCr of 4.47 mg/dL (H)).   Medical History: Past Medical History:  Diagnosis Date  . Absent pedal pulses   . Anemia of chronic renal failure 10/05/2010   Nice response to Procrit   . Anemia, iron deficiency 08/18/2012  . Ataxia involving legs    has had several falls  . CAD (coronary artery disease), native coronary artery 07/05/2016   3VD by cath 2008  01/22/07 CABG with LIMA to LAD< SVG to dx, SVG to OM1-2, SVG to PDA Dr. Roxan Hockey  . Chronic diastolic heart failure ([place]) 05/23/2014  . Dementia    "mild" per MD note  . Diabetes mellitus   . ESRD on hemodialysis ([place])   . Family history of anesthesia complication    Nephew- had time waking  . Hyperlipidemia   . Hypertensive heart disease without CHF 11/10/2013  . Neuropathy ([place])   . Osteoarthritis 01/01/2013  . Type 2 diabetes mellitus with renal manifestations ([place]) 02/01/2014    Assessment: 83 yo female with new RLE DVT in setting of ESRD. Hgb 8.4, PLTc 178.   Goal of Therapy:  Monitor platelets by anticoagulation protocol: Yes   Plan:  1. Lovenox 70 mg subcutaneously every 24 hours based on renal function 2. Warfarin 4 mg x 1 this evening  3. Daily INR  Vincenza Hews, PharmD, BCPS 07/09/2016, 3:51 PM

## 2016-07-09 NOTE — Discharge Summary (Signed)
Physician Discharge Summary   Patient ID: [name] MRN: [id number] DOB/AGE: [birth year] 83 y.o.  Admit date: 07/03/2016 Discharge date: 07/10/2016  Primary Care Physician:  Jani Gravel, MD  Discharge Diagnoses:    . Displaced supracondylar fracture with intracondylar extension of lower end of right femur, initial encounter for closed fracture ([place]) . CAD (coronary artery disease), native coronary artery   Left foot fracture   Postop chest pain   MAT tachycardia   End-stage renal disease on hemodialysis, TTS   Diabetes mellitus   Hyperlipidemia   Mild dementia   Postop acute blood loss anemia   Postop fever   Right lower extremity DVT   Consults:  Cardiology, Dr. Radford Pax Nephrology, Dr. [facility]  Recommendations for Outpatient Follow-up:  1. PT evaluation recommended skilled nursing facility  2. Please repeat CBC/BMET at next visit 3. Please obtain PT/INR on 07/11/2016 tomorrow and then repeat on Monday , 4/16 4. Patient started on Lovenox 70 mg daily until INR above 2, Coumadin 4 mg daily, please adjust dose according to PT/INR levels 5. Continue Coumadin for 3 months for provoked DVT 6. Please start aspirin 81 mg daily after patient has completed Coumadin    DIET: Renal diet with fluid restriction 1200 mL    Allergies:  No Known Allergies   DISCHARGE MEDICATIONS: Current Discharge Medication List    START taking these medications   Details  amiodarone (PACERONE) 200 MG tablet Take 1 tablet (200 mg total) by mouth daily.    carvedilol (COREG) 6.25 MG tablet Take 1 tablet (6.25 mg total) by mouth 2 (two) times daily with a meal.    enoxaparin (LOVENOX) 80 MG/0.8ML injection Inject 0.7 mLs (70 mg total) into the skin daily. Please continue until INR above 2 then stop Qty: 22.4 Syringe    insulin aspart (NOVOLOG) 100 UNIT/ML injection Inject 0-9 Units into the skin 3 (three) times daily with meals. Sliding scale CBG 70 - 120: 0 units CBG 121 -  150: 1 unit,  CBG 151 - 200: 2 units,  CBG 201 - 250: 3 units,  CBG 251 - 300: 5 units,  CBG 301 - 350: 7 units,  CBG 351 - 400: 9 units   CBG > 400: 9 units and notify your MD Qty: 10 mL, Refills: 11    insulin glargine (LANTUS) 100 UNIT/ML injection Inject 0.05 mLs (5 Units total) into the skin at bedtime. Qty: 10 mL, Refills: 11    multivitamin (RENA-VIT) TABS tablet Take 1 tablet by mouth at bedtime. Refills: 0    Nutritional Supplements (FEEDING SUPPLEMENT, NEPRO CARB STEADY,) LIQD Take 237 mLs by mouth 2 (two) times daily between meals. Refills: 0    warfarin (COUMADIN) 4 MG tablet Take 1 tablet (4 mg total) by mouth daily at 6 PM. Please adjust dose according to PT/INR, continue for 3 months      CONTINUE these medications which have CHANGED   Details  acetaminophen-codeine (TYLENOL #3) 300-30 MG tablet Take 1 tablet by mouth every 6 (six) hours as needed for moderate pain. Qty: 30 tablet, Refills: 0    calcitRIOL (ROCALTROL) 0.5 MCG capsule Take 1 capsule (0.5 mcg total) by mouth Every Tuesday,Thursday,and Saturday with dialysis.      CONTINUE these medications which have NOT CHANGED   Details  erythromycin ophthalmic ointment Place 1 application into both eyes at bedtime. For keratitis    ferrous sulfate 325 (65 FE) MG tablet Take 325 mg by mouth 2 (two) times daily. As  SUPPLEMENT    folic acid-vitamin b complex-vitamin c-selenium-zinc (DIALYVITE) 3 MG TABS tablet Take 1 tablet by mouth daily.    ketoconazole (NIZORAL) 2 % cream Apply 1 application topically daily.    lidocaine-prilocaine (EMLA) cream Apply 1 application topically as needed (On Tues, Thu,and Sat.).    loperamide (IMODIUM A-D) 2 MG tablet Take 2 mg by mouth 4 (four) times daily as needed for diarrhea or loose stools.    menthol-cetylpyridinium (CEPACOL) 3 MG lozenge Take 1 lozenge by mouth as needed for sore throat.    ondansetron (ZOFRAN) 4 MG tablet Take 4 mg by mouth every 8 (eight) hours as needed  for nausea or vomiting.    Propylene Glycol (SYSTANE BALANCE) 0.6 % SOLN Apply 1 drop to eye 2 (two) times daily.    rOPINIRole (REQUIP) 1 MG tablet Take 1 mg by mouth 2 (two) times daily.    rosuvastatin (CRESTOR) 5 MG tablet Take 5 mg by mouth at bedtime. For CHOLESTEROL    donepezil (ARICEPT) 10 MG tablet Take 5 mg by mouth at bedtime. For MEMORY    gabapentin (NEURONTIN) 300 MG capsule Take 300 mg by mouth at bedtime. For NEUROPATHY/NERVE PAIN    sevelamer carbonate (RENVELA) 800 MG tablet Take 1 tablet (800 mg total) by mouth 3 (three) times daily with meals.      STOP taking these medications     cilostazol (PLETAL) 100 MG tablet      glimepiride (AMARYL) 1 MG tablet      linagliptin (TRADJENTA) 5 MG TABS tablet      sodium bicarbonate 650 MG tablet      Vitamin D, Ergocalciferol, (DRISDOL) 50000 UNITS CAPS capsule      torsemide (DEMADEX) 10 MG tablet          Brief H and P: For complete details please refer to admission H and P, but in brief Stacey D Mullinsis an 83 y.o.femalewith hx of dementia, mild DM, ataxia and fall risks, ESRD on HD (T Th Sat), HLD, HTN, fell at home without loss of consciousness, presented to the ER with left leg pain. Work up in the ER included an X ray of the femur showing comminuted distal femur Fx with some angulation, Xray of the left foot showed toe and metatarsal Fx, LS spine film showed compressive Fx of undetermined age, no Fx of the left pelvis, and CXR showed cardiomegaly and no infiltrate. Her UA was negative, and lab work is still pending. Orthopedics was consulted, and Dr Lorin Mercy planned to perform ORIF of the left femur, and foot did not require surgical intervention.   Hospital Course:  Left distal femur fracture comminuted Secondary to mechanical fall. Patient was seen by orthopedics and status post ORIF left closed supracondylar distal femur fracture with intracondylar extension per Dr. Lorin Mercy 07/05/2016.  PT evaluation  recommended skilled nursing facility  left foot fracture Per orthopedics no surgical intervention at this time. Pain management. Supportive care. Per orthopedics.  postop chest pain/ MAT/tachycardia Postoperatively patient was noted in the recovery room 12 complaints of left-sided chest pain noted also to be tachycardic. Cardiology was consulted as patient with significant coronary artery disease history. Patient also noted to be anemic with a hemoglobin of 7.1 postprocedure. EKG done showed MAT. Patient given IV metoprolol with improvement with tachycardia. Cardiology recommended serial cardiac enzymes which were minimally elevated. TSH was within normal limits at 1.703. Continue Coreg.  2-D echo showed EF of 65-70% with grade 1 diastolic dysfunction. Cardiology recommended starting  beta blocker and amiodarone. D-dimer was elevated hence a VQ scan was done which was negative for any PE. Doppler ultrasound of the lower extremities was positive for right lower extremity DVT Per cardiology no further cardiac workup at this time, restart aspirin if okay with orthopedics. I discussed with Dr. Golden Hurter at the time of discharge who recommended that it is okay to restart aspirin after patient has completed the Coumadin as it puts patient at higher bleeding risk to be on aspirin and Coumadin.   end-stage renal disease on hemodialysis Patient has a seen in consultation by nephrology. Patient on Tuesday Thursday Saturday schedule.  She is completing hemodialysis prior to discharge.   diabetes mellitus Patient with CBGs in the 142-237. Discontinued oral Amaryl and Tradjenta. Continue Lantus 5 units daily. Sliding scale insulin.   hyperlipidemia Continue statin.   mild dementia Stable. Continue Aricept.  postop acute blood loss anemia Patient was given 2 units packed red blood cells in the PACU to maintain hemoglobin greater than 8-8.5 as patient noted with complaints of chest pain  postoperatively and noted to be tachycardic.  postop fever Blood cultures with no growth to date. Chest x-ray negative for any acute abnormalities. fevers resolved.  RLE DVT - Doppler LE showed positive for a small segment of deep vein thrombosis in the proximal gastrocnemius vein -Placed on coumadin for 3 months and therapeutic lovenox until INR >2.   Day of Discharge BP (!) 144/72 (BP Location: Right Arm)   Pulse 76   Temp 98.4 F (36.9 C) (Oral)   Resp 15   Ht 5\' 6"  (1.676 m)   Wt 74.7 kg (164 lb 10.9 oz)   SpO2 98%   BMI 26.58 kg/m   Physical Exam: General: Alert and awake oriented x3 not in any acute distress. HEENT: anicteric sclera, pupils reactive to light and accommodation CVS: S1-S2 clear no murmur rubs or gallops Chest: clear to auscultation bilaterally, no wheezing rales or rhonchi Abdomen: soft nontender, nondistended, normal bowel sounds Extremities: no cyanosis, clubbing, left lower extremity  in splint    The results of significant diagnostics from this hospitalization (including imaging, microbiology, ancillary and laboratory) are listed below for reference.    LAB RESULTS: Basic Metabolic Panel:  Recent Labs Lab 07/05/16 1802  07/09/16 0536 07/10/16 0331  NA  --   < > 133* 133*  K  --   < > 4.1 3.8  CL  --   < > 93* 95*  CO2  --   < > 27 29  GLUCOSE  --   < > 110* 114*  BUN  --   < > 33* 51*  CREATININE 7.61*  < > 4.47* 6.14*  CALCIUM  --   < > 8.3* 8.3*  MG 1.9  --   --   --   PHOS  --   < > 3.7 4.1  < > = values in this interval not displayed. Liver Function Tests:  Recent Labs Lab 07/09/16 0536 07/10/16 0331  ALBUMIN 2.1* 2.0*   No results for input(s): LIPASE, AMYLASE in the last 168 hours. No results for input(s): AMMONIA in the last 168 hours. CBC:  Recent Labs Lab 07/05/16 0551  07/09/16 0536 07/10/16 0331  WBC 13.3*  < > 7.7 7.9  NEUTROABS 10.0*  --   --   --   HGB 7.6*  < > 8.4* 8.6*  HCT 24.4*  < > 26.5* 27.1*   MCV 91.4  < > 89.8 89.7  PLT 202  < > 178 169  < > = values in this interval not displayed. Cardiac Enzymes:  Recent Labs Lab 07/05/16 1115 07/05/16 1802  CKTOTAL 3,140*  --   CKMB 15.5*  --   TROPONINI 0.03* 0.06*   BNP: Invalid input(s): POCBNP CBG:  Recent Labs Lab 07/10/16 0509 07/10/16 0947  GLUCAP 124* 129*    Significant Diagnostic Studies:  Dg Lumbar Spine Complete  Result Date: 07/03/2016 CLINICAL DATA:  PT states she was getting out of the car after her dialysis treatment today and tripped over her rolling walker and landed onto her left leg and buttocks. PT c/o pain to buttocks and down left leg since fall. EXAM: LUMBAR SPINE - COMPLETE 4+ VIEW COMPARISON:  04/20/2008 FINDINGS: There is a mild compression fracture of L4 with depression of the upper endplate, new since the prior radiographs. No other fractures. Slight, approximately 3 mm, anterolisthesis of L4 on L5, and mild retrolisthesis of L2 on L3 also new since the prior exam. No other spondylolisthesis. Moderate loss of disc height with endplate sclerosis and osteophytes at L2-L3, increased in severity from the prior exam. Moderate loss disc height at L3-L4. Moderate to severe loss of disc height at L4-L5 and moderate loss disc height at L5-S1. Loss of disc height has increased at L3-L4 and L4-L5. Bones are diffusely demineralized. There are dense vascular calcifications. Calcification in the central pelvis is consistent with calcified fibroid, similar to the prior exam. There has been a prior cholecystectomy. IMPRESSION: 1. Mild depression fracture of L4, of unclear chronicity, but new since the prior lumbar spine radiographs. 2. No other fractures. 3. Degenerative changes as detailed which have progressed since the prior study. Electronically Signed   By: Lajean Manes M.D.   On: 07/03/2016 19:23   Dg Ankle Complete Left  Result Date: 07/03/2016 CLINICAL DATA:  PT states she was getting out of the car after her  dialysis treatment today and tripped over her rolling walker and landed onto her left leg and buttocks. PT c/o pain to buttocks and down left leg since fall. EXAM: LEFT ANKLE COMPLETE - 3+ VIEW COMPARISON:  None. FINDINGS: No fracture.  No bone lesion. The ankle mortise is normally spaced and aligned. There are plantar and dorsal calcaneal spurs. Bones are diffusely demineralized. There is mild nonspecific subcutaneous soft tissue edema and dense vascular calcifications. IMPRESSION: No fracture, dislocation or acute finding. Electronically Signed   By: Lajean Manes M.D.   On: 07/03/2016 19:28   Dg Chest Port 1 View  Result Date: 07/03/2016 CLINICAL DATA:  Femur fracture.  History of hypertension. EXAM: PORTABLE CHEST 1 VIEW COMPARISON:  Chest radiograph April 17, 2014 FINDINGS: Cardiac silhouette is mildly enlarged, mediastinal silhouette is unremarkable for this low inspiratory examination with crowded vasculature markings. Calcified aortic knob. Similar fullness of the hila compatible with vascular structures. Status post median sternotomy for CABG. The lungs are clear without pleural effusions or focal consolidations. Trachea projects midline and there is no pneumothorax. Included soft tissue planes and osseous structures are non-suspicious. Surgical clips in the included right abdomen compatible with cholecystectomy. IMPRESSION: Mild cardiomegaly without acute pulmonary process in this low inspiratory portable examination. Electronically Signed   By: Elon Alas M.D.   On: 07/03/2016 20:19   Dg Knee Complete 4 Views Left  Result Date: 07/03/2016 CLINICAL DATA:  PT states she was getting out of the car after her dialysis treatment today and tripped over her rolling walker and landed onto her  left leg and buttocks. PT c/o pain to buttocks and down left leg since fall. EXAM: LEFT KNEE - COMPLETE 4+ VIEW COMPARISON:  None. FINDINGS: There is a comminuted fracture of the distal femur. There is a  transverse fracture across the proximal metaphysis. There are comminuted fracture components along the lateral margin of the fracture. Fracture is mildly impacted by approximately 1.5 cm. The fracture shows posterior and lateral displacement by just over 1 cm. There is also anterior angulation of the distal fracture component of approximately 20 degrees. There are no other fractures. The bones are diffusely demineralized. The knee joint is normally aligned. There is tricompartmental joint space narrowing with marginal osteophytes consistent with osteoarthritis. No significant joint effusion is noted. There is surrounding soft tissue edema and dense vascular calcifications. IMPRESSION: Comminuted, mildly displaced and impacted and angulated fracture of the distal left femur at the proximal metaphysis. No dislocation. Electronically Signed   By: Lajean Manes M.D.   On: 07/03/2016 19:27   Dg Foot Complete Left  Result Date: 07/03/2016 CLINICAL DATA:  Patient tripped and fell.  Pain. EXAM: LEFT FOOT - COMPLETE 3+ VIEW COMPARISON:  None. FINDINGS: Bones are demineralized. Degenerative changes are seen at the midfoot. Linear lucencies through the proximal aspect of the second, third, and fourth metatarsals is consistent with fracture. Cortical over had with linear lucency in the neck of the second metatarsal noted. Oblique fracture through the base of the great toe distal phalanx appears nonacute. Dense vascular calcifications suggest diabetes. IMPRESSION: 1. Transverse fractures through the proximal second, third, and fourth metatarsals. 2. Question fracture in the second metatarsal neck. 3. Oblique fracture through the base of the great toe distal phalanx appears nonacute. Electronically Signed   By: Misty Stanley M.D.   On: 07/03/2016 19:25   Dg Hip Unilat With Pelvis 2-3 Views Left  Result Date: 07/03/2016 CLINICAL DATA:  PT states she was getting out of the car after her dialysis treatment today and tripped  over her rolling walker and landed onto her left leg and buttocks. PT c/o pain to buttocks and down left leg since fall. EXAM: DG HIP (WITH OR WITHOUT PELVIS) 2-3V LEFT COMPARISON:  None. FINDINGS: No fracture or dislocation.  No bone lesion. Mild concentric hip joint space narrowing, left greater than right. The SI joints and symphysis pubis are normally aligned. Bones are diffusely demineralized. Densely calcified fibroid in the central pelvis. There is dense arterial vascular calcification. IMPRESSION: 1. No fracture, dislocation or acute finding. Electronically Signed   By: Lajean Manes M.D.   On: 07/03/2016 19:29    2D ECHO: Study Conclusions  - Left ventricle: The cavity size was normal. There was mild   concentric hypertrophy. Systolic function was vigorous. The   estimated ejection fraction was in the range of 65% to 70%. Wall   motion was normal; there were no regional wall motion   abnormalities. Doppler parameters are consistent with abnormal   left ventricular relaxation (grade 1 diastolic dysfunction).   There was no evidence of elevated ventricular filling pressure by   Doppler parameters. - Aortic valve: Trileaflet; mildly thickened, mildly calcified   leaflets. Mean gradient (S): 8 mm Hg. Peak gradient (S): 13 mm   Hg. Valve area (VTI): 1.92 cm^2. Valve area (Vmax): 1.91 cm^2.   Valve area (Vmean): 1.51 cm^2. - Mitral valve: Mildly thickened leaflets and moderately calcified   chordae. - Left atrium: The atrium was normal in size. - Right ventricle: The cavity size was mildly  dilated. Wall   thickness was normal. Systolic function was normal. - Tricuspid valve: There was moderate regurgitation. - Pulmonary arteries: Systolic pressure was moderately increased.   PA peak pressure: 55 mm Hg (S). - Inferior vena cava: The vessel was dilated. The respirophasic   diameter changes were blunted (< 50%), consistent with elevated   central venous pressure.    Disposition and  Follow-up: Discharge Instructions    Diet Carb Modified    Complete by:  As directed    Increase activity slowly    Complete by:  As directed    Non weight bearing    Complete by:  As directed    Laterality:  left   Extremity:  Lower   Due to left foot 2nd,3rd and 4th Metatarsal fractures.  Has IM rod for left femur fracture.       DISPOSITION: SNF    DISCHARGE FOLLOW-UP  Contact information for follow-up providers    Marybelle Killings, MD Follow up in 2 week(s).   Specialty:  Orthopedic Surgery Contact information: [place] [address] [phone]        Jani Gravel, MD. Schedule an appointment as soon as possible for a visit in 2 week(s).   Specialty:  Internal Medicine Contact information: [place] [address] [phone]        Fransico Him, MD. Schedule an appointment as soon as possible for a visit in 2 week(s).   Specialty:  Cardiology Contact information: [address] [place] [address] [phone]            Contact information for after-discharge care    Destination    [facility] Follow up.   Specialty:  [place] information: [address] [place] [phone]                   Time spent on Discharge: 27mins    Signed:   Estill Cotta M.D. [facility] 07/10/2016, 12:03 PM Pager: [phone]

## 2016-07-09 NOTE — Progress Notes (Signed)
PT Cancellation Note  Patient Details Name: [name] MRN: [id number] DOB: Nov 03, 1930   Cancelled Treatment:    Reason Eval/Treat Not Completed: Patient at procedure or test/unavailable (Pt at doppler test. Will check back as time allows.  Thanks.)   Denice Paradise 07/09/2016, 2:09 PM Ryana Montecalvo,PT Acute Rehabilitation [phone] (pager)

## 2016-07-09 NOTE — Progress Notes (Signed)
07/09/16  1125  Notified [id number] Vascular Dept for doppler

## 2016-07-09 NOTE — Clinical Social Work Note (Signed)
CSW talked with attending, Dr. Tana Coast regarding patient's discharge today. Per MD, patient not medically stable for discharge today, will begin coumadin and patient should be ready for discharge on Thursday, 4/12. Suanne Marker, admissions director with [facility] contacted and message left regarding no discharge today. CSW will continue to follow and facilitate d/c to SNF when medically stable.  Jenille Laszlo Givens, MSW, LCSW Licensed Clinical Social Worker [place] [phone]

## 2016-07-09 NOTE — Progress Notes (Signed)
Stopped by to visit when saw pt restless in bed. She was adjusting her bed to ease her foot. She requested and appreciated prayer for healing, emotional/spiritual support. Chaplain available for f/u.   07/09/16 1600  Clinical Encounter Type  Visited With Patient  Visit Type Initial;Post-op  Referral From Chaplain  Spiritual Encounters  Spiritual Needs Prayer;Emotional  Stress Factors  Patient Stress Factors Health changes   Gerrit Heck, Chaplain

## 2016-07-10 DIAGNOSIS — Z992 Dependence on renal dialysis: Secondary | ICD-10-CM

## 2016-07-10 DIAGNOSIS — W19XXXA Unspecified fall, initial encounter: Secondary | ICD-10-CM

## 2016-07-10 DIAGNOSIS — N186 End stage renal disease: Secondary | ICD-10-CM

## 2016-07-10 LAB — RENAL FUNCTION PANEL
ALBUMIN: 2 g/dL — AB (ref 3.5–5.0)
Anion gap: 9 (ref 5–15)
BUN: 51 mg/dL — AB (ref 6–20)
CO2: 29 mmol/L (ref 22–32)
Calcium: 8.3 mg/dL — ABNORMAL LOW (ref 8.9–10.3)
Chloride: 95 mmol/L — ABNORMAL LOW (ref 101–111)
Creatinine, Ser: 6.14 mg/dL — ABNORMAL HIGH (ref 0.44–1.00)
GFR, EST AFRICAN AMERICAN: 7 mL/min — AB (ref >60)
GFR, EST NON AFRICAN AMERICAN: 6 mL/min — AB (ref >60)
Glucose, Bld: 114 mg/dL — ABNORMAL HIGH (ref 65–99)
PHOSPHORUS: 4.1 mg/dL (ref 2.5–4.6)
POTASSIUM: 3.8 mmol/L (ref 3.5–5.1)
Sodium: 133 mmol/L — ABNORMAL LOW (ref 135–145)

## 2016-07-10 LAB — CBC
HEMATOCRIT: 27.1 % — AB (ref 36.0–46.0)
Hemoglobin: 8.6 g/dL — ABNORMAL LOW (ref 12.0–15.0)
MCH: 28.5 pg (ref 26.0–34.0)
MCHC: 31.7 g/dL (ref 30.0–36.0)
MCV: 89.7 fL (ref 78.0–100.0)
Platelets: 169 10*3/uL (ref 150–400)
RBC: 3.02 MIL/uL — AB (ref 3.87–5.11)
RDW: 15.3 % (ref 11.5–15.5)
WBC: 7.9 10*3/uL (ref 4.0–10.5)

## 2016-07-10 LAB — PROTIME-INR
INR: 1.14
Prothrombin Time: 14.6 seconds (ref 11.4–15.2)

## 2016-07-10 LAB — GLUCOSE, CAPILLARY
GLUCOSE-CAPILLARY: 143 mg/dL — AB (ref 65–99)
Glucose-Capillary: 124 mg/dL — ABNORMAL HIGH (ref 65–99)
Glucose-Capillary: 129 mg/dL — ABNORMAL HIGH (ref 65–99)

## 2016-07-10 MED ORDER — WARFARIN SODIUM 4 MG PO TABS: 4.0000 mg | ORAL_TABLET | Freq: Every day | ORAL | Status: DC

## 2016-07-10 MED ORDER — CALCITRIOL 0.25 MCG PO CAPS
ORAL_CAPSULE | ORAL | Status: AC
  Filled 2016-07-10: qty 2

## 2016-07-10 MED ORDER — ENOXAPARIN SODIUM 80 MG/0.8ML ~~LOC~~ SOLN: 70.0000 mg | SUBCUTANEOUS | Status: DC

## 2016-07-10 MED ORDER — DARBEPOETIN ALFA 100 MCG/0.5ML IJ SOSY
PREFILLED_SYRINGE | INTRAMUSCULAR | Status: AC
  Filled 2016-07-10: qty 0.5

## 2016-07-10 MED ORDER — BISACODYL 10 MG RE SUPP
10.0000 mg | Freq: Once | RECTAL | Status: DC
  Filled 2016-07-10: qty 1

## 2016-07-10 MED ORDER — ASPIRIN EC 81 MG PO TBEC: 81.0000 mg | DELAYED_RELEASE_TABLET | Freq: Every day | ORAL | Status: DC

## 2016-07-10 NOTE — Procedures (Signed)
Pt seen on HD.  Ap 190 Vp 170 BFR 400.  Tolerating HD well  SBP 126

## 2016-07-10 NOTE — Clinical Social Work Note (Signed)
Clinical Social Worker facilitated patient discharge including contacting patient family and facility to confirm patient discharge plans.  Clinical information faxed to facility and family agreeable with plan.  CSW arranged ambulance transport via [facility] to [facility].  RN to call [phone] for report prior to discharge. Patient will go to room 206.  Clinical Social Worker will sign off for now as social work intervention is no longer needed. Please consult [place] again if new need arises.  [address], [place]

## 2016-07-10 NOTE — Progress Notes (Signed)
PT Cancellation Note  Patient Details Name: [name] MRN: [id number] DOB: [birth year]   Cancelled Treatment:    Reason Eval/Treat Not Completed: Patient at procedure or test/unavailable (Pt in HD this am. Will return as time allows.  )   Denice Paradise 07/10/2016, 11:00 AM Amanda Cockayne Acute Rehabilitation [phone] (pager)

## 2016-07-10 NOTE — Progress Notes (Signed)
Patient Name: [name] Date of Encounter: 07/10/2016  Primary Cardiologist: Prior patient of Dr. Meredith Mody Problem List     Active Problems:   Nondisplaced fracture of second metatarsal bone, left foot, initial encounter for closed fracture   Closed displaced fracture of distal phalanx of left great toe   ESRD on hemodialysis ([place])   Displaced supracondylar fracture with intracondylar extension of lower end of right femur, initial encounter for closed fracture ([place])   CAD (coronary artery disease), native coronary artery   Chest pain   Closed fracture of left distal femur (HCC)   Closed nondisplaced fracture of fourth metatarsal bone of left foot   Closed nondisplaced fracture of third metatarsal bone of left foot   Multifocal atrial tachycardia (HCC)     Subjective    Currently in HD with no complaints  Inpatient Medications    Scheduled Meds: . acetaminophen  500 mg Oral TID  . acetaminophen  650 mg Oral Once  . amiodarone  200 mg Oral Daily  . bisacodyl  10 mg Rectal Once  . calcitRIOL  0.5 mcg Oral Q T,Th,Sa-HD  . carvedilol  6.25 mg Oral BID WC  . darbepoetin (ARANESP) injection - DIALYSIS  100 mcg Intravenous Q Tue-HD  . donepezil  5 mg Oral QHS  . enoxaparin (LOVENOX) injection  70 mg Subcutaneous Q24H  . erythromycin  1 application Both Eyes QHS  . feeding supplement (NEPRO CARB STEADY)  237 mL Oral BID BM  . ferrous sulfate  325 mg Oral BID  . gabapentin  300 mg Oral Daily  . insulin aspart  0-9 Units Subcutaneous Q4H  . insulin glargine  5 Units Subcutaneous QHS  . ketoconazole  1 application Topical Daily  . multivitamin  1 tablet Oral QHS  . polyvinyl alcohol  1 drop Both Eyes BID  . rOPINIRole  1 mg Oral BID  . rosuvastatin  5 mg Oral QHS  . sevelamer carbonate  800 mg Oral TID WC  . Warfarin - Pharmacist Dosing Inpatient   Does not apply q1800   Continuous Infusions: . sodium chloride 10 mL/hr at 07/04/16 1747   PRN  Meds: acetaminophen-codeine, HYDROcodone-acetaminophen, loperamide, menthol-cetylpyridinium, ondansetron, technetium albumin aggregated, technetium TC 79M diethylenetriame-pentaacetic acid   Vital Signs    Vitals:   07/10/16 0500 07/10/16 0800 07/10/16 0808 07/10/16 0813  BP: (!) 108/57 (!) 118/58 (!) 124/55 (!) 118/56  Pulse: 70 68 67 68  Resp: 18 17  17   Temp: 97.7 F (36.5 C) 98.2 F (36.8 C)    TempSrc: Oral Oral    SpO2: 100% 98%    Weight:  169 lb 1.5 oz (76.7 kg)    Height:        Intake/Output Summary (Last 24 hours) at 07/10/16 0829 Last data filed at 07/09/16 1730  Gross per 24 hour  Intake              476 ml  Output                0 ml  Net              476 ml   Filed Weights   07/08/16 1310 07/08/16 1625 07/10/16 0800  Weight: 173 lb 15.1 oz (78.9 kg) 171 lb 11.8 oz (77.9 kg) 169 lb 1.5 oz (76.7 kg)    Physical Exam    GEN: well developed, well nourished in NAD HEENT: benign Neck: no bruit or JVD Cardiac: RRR with  no ectopy, no M/R/G Respiratory:  CTA bilaterally GI:NT, ND active BS MS: no edema Skin: benign Neuro:  A&O x 3 Psych:normal affect  Labs    CBC  Recent Labs  07/09/16 0536 07/10/16 0331  WBC 7.7 7.9  HGB 8.4* 8.6*  HCT 26.5* 27.1*  MCV 89.8 89.7  PLT 178 191   Basic Metabolic Panel  Recent Labs  07/09/16 0536 07/10/16 0331  NA 133* 133*  K 4.1 3.8  CL 93* 95*  CO2 27 29  GLUCOSE 110* 114*  BUN 33* 51*  CREATININE 4.47* 6.14*  CALCIUM 8.3* 8.3*  PHOS 3.7 4.1   Liver Function Tests  Recent Labs  07/09/16 0536 07/10/16 0331  ALBUMIN 2.1* 2.0*   No results for input(s): LIPASE, AMYLASE in the last 72 hours. Cardiac Enzymes No results for input(s): CKTOTAL, CKMB, CKMBINDEX, TROPONINI in the last 72 hours. BNP Invalid input(s): POCBNP D-Dimer  Recent Labs  07/08/16 0920  DDIMER >20.00*   Hemoglobin A1C  Recent Labs  07/08/16 0638  HGBA1C 5.6   Fasting Lipid Panel  Recent Labs  07/08/16 1934   CHOL 95  HDL 27*  LDLCALC 37  TRIG 153*  CHOLHDL 3.5   Thyroid Function Tests No results for input(s): TSH, T4TOTAL, T3FREE, THYROIDAB in the last 72 hours.  Invalid input(s): FREET3  Telemetry    NSR- Personally Reviewed  ECG    NSR - Personally Reviewed  Radiology    Dg Chest 1 View  Result Date: 07/08/2016 CLINICAL DATA:  Chest pain today. EXAM: CHEST 1 VIEW COMPARISON:  Single-view of the chest 07/05/2016. PA and lateral chest 04/17/2014. FINDINGS: There is cardiomegaly without edema. No consolidative process, pneumothorax or effusion. The patient is status post CABG. IMPRESSION: Cardiomegaly without acute disease. Electronically Signed   By: Inge Rise M.D.   On: 07/08/2016 20:24   Nm Pulmonary Perf And Vent  Result Date: 07/08/2016 CLINICAL DATA:  Femur surgery 2 days ago. Dialysis patient. Substernal chest pain. EXAM: NUCLEAR MEDICINE VENTILATION - PERFUSION LUNG SCAN TECHNIQUE: Ventilation images were obtained in multiple projections using inhaled aerosol Tc-85m DTPA. Perfusion images were obtained in multiple projections after intravenous injection of Tc-67m MAA. RADIOPHARMACEUTICALS:  30.5 mCi Technetium-58m DTPA aerosol inhalation and 4.3 mCi Technetium-75m MAA IV COMPARISON:  Chest radiography same day FINDINGS: Ventilation: No focal ventilation defect. Slight patchy ventilation which could relate to radiopharmaceutical distribution versus mild lung disease. Perfusion: No wedge shaped peripheral perfusion defects to suggest acute pulmonary embolism. IMPRESSION: No perfusion defects to suggest pulmonary emboli. Mild patchy perfusion pattern could relate to radiopharmaceutical distribution versus mild lung disease. Electronically Signed   By: Nelson Chimes M.D.   On: 07/08/2016 19:08    Cardiac Studies   2D echo 07/07/2016 Study Conclusions  - Left ventricle: The cavity size was normal. There was mild   concentric hypertrophy. Systolic function was vigorous. The    estimated ejection fraction was in the range of 65% to 70%. Wall   motion was normal; there were no regional wall motion   abnormalities. Doppler parameters are consistent with abnormal   left ventricular relaxation (grade 1 diastolic dysfunction).   There was no evidence of elevated ventricular filling pressure by   Doppler parameters. - Aortic valve: Trileaflet; mildly thickened, mildly calcified   leaflets. Mean gradient (S): 8 mm Hg. Peak gradient (S): 13 mm   Hg. Valve area (VTI): 1.92 cm^2. Valve area (Vmax): 1.91 cm^2.   Valve area (Vmean): 1.51 cm^2. - Mitral valve: Mildly  thickened leaflets and moderately calcified   chordae. - Left atrium: The atrium was normal in size. - Right ventricle: The cavity size was mildly dilated. Wall   thickness was normal. Systolic function was normal. - Tricuspid valve: There was moderate regurgitation. - Pulmonary arteries: Systolic pressure was moderately increased.   PA peak pressure: 55 mm Hg (S). - Inferior vena cava: The vessel was dilated. The respirophasic   diameter changes were blunted (< 50%), consistent with elevated   central venous pressure.  Patient Profile     This is an 83 y.o. female with  a h/o CAD s/p 5 vessel CABG in 12/2006, reported PAF in 2015 post procedure not on long term full-dose anticoagulation, ESRD on HD (TTS), dementia, multiple mechanical falls, DM, PAD, anemia of chronic disease, HTN, HLD, obesity, mild aortic stenosis, moderate pulmonary HTN with PASP 55mmHg and OA who presented to [facility] on 4/5 with mechanical fall leading to the displaced angulated impacted left suprandylar femur fracture fracture. She underwent successful ORIF on 4/7. In the recovery room she noted left sided chest pain. Initial troponin 0.03. EKG with MAT. Cardiology asked to evaluate.   Assessment & Plan    1. Chest pain/CAD s/p remote CABG  -Brief episode of CP in PACU lasting approximately 15 minutes before self resolving.  She has not had  any further episodes since that occurrence. - Initial troponin 0.03 > 0.6. CPK was elevated at 3140 with MB 15.5 and RI of 0.5 c/w skeletal muscle trauma from fall and not ACS. - 2D echo showed normal LVF with EF 65-70% with no RWMAs and therefore no further ischemic workup recommended at this time - restart ASA if ok with ortho - continue Beta blocker and statin. - RV mildly dilated on echo but normal RVF.  She has moderate pulmonary HTN with PASP 58mmHg similar to echo findings 2015 except that now RV mildly dilated.  D-dimer > 20 but VQ scan negative for PE - no further cardiac workup at this tim.   - pulmonary HTN should be followed by Cardiology as outpt so please set up appt in 4-6 weeks in our office  2. PAT - likely related to her acute trauma/ABL anemia - Asymptomatic - Will continue beta blocker - cannot increase further due to soft BP - TSH normal - started low dose Amio 200mg  daily to suppress frequent PAT and prevent progression to afib. - follow daily EKG for QTc - EKG pending this am  3. ESRD on HD: - currently in HD - Per nephrology  4. Closed displaced angulated left femur fracture: - S/p ORIF - Per ortho  5. ABL anemia/anemia of chronic disease: - Maintain HGB > 8-8.5>8.4>8.5 today  No new recs at this time.  Will sign off.  Call with any questions.   Signed, Fransico Him, MD  07/10/2016, 8:29 AM

## 2016-07-11 LAB — CULTURE, BLOOD (ROUTINE X 2)
CULTURE: NO GROWTH
Culture: NO GROWTH
SPECIAL REQUESTS: ADEQUATE
Special Requests: ADEQUATE

## 2016-08-13 ENCOUNTER — Ambulatory Visit (INDEPENDENT_AMBULATORY_CARE_PROVIDER_SITE_OTHER): Payer: Medicare (Managed Care)

## 2016-08-13 ENCOUNTER — Encounter (INDEPENDENT_AMBULATORY_CARE_PROVIDER_SITE_OTHER): Payer: Self-pay

## 2016-08-13 ENCOUNTER — Encounter (INDEPENDENT_AMBULATORY_CARE_PROVIDER_SITE_OTHER): Payer: Self-pay | Admitting: Orthopaedic Surgery

## 2016-08-13 ENCOUNTER — Ambulatory Visit (INDEPENDENT_AMBULATORY_CARE_PROVIDER_SITE_OTHER): Payer: Medicare (Managed Care) | Admitting: Orthopaedic Surgery

## 2016-08-13 DIAGNOSIS — M79672 Pain in left foot: Secondary | ICD-10-CM

## 2016-08-13 DIAGNOSIS — S72461A Displaced supracondylar fracture with intracondylar extension of lower end of right femur, initial encounter for closed fracture: Secondary | ICD-10-CM | POA: Diagnosis not present

## 2016-08-13 NOTE — Progress Notes (Signed)
Post-Op Visit Note   Patient: [name]           Date of Birth: 11-25-1930           MRN: [id number] Visit Date: 08/13/2016 PCP: Jani Gravel, MD   Assessment & Plan:  Chief Complaint:  Chief Complaint  Patient presents with  . Left Leg - Routine Post Op  . Left Foot - Fracture   Visit Diagnoses:  1. Displaced supracondylar fracture with intracondylar extension of lower end of right femur, initial encounter for closed fracture ([place])   2. Pain in left foot     Plan: Patient follow up in 4 weeks for recheck. Physical therapist will start 50% partial weightbearing left lower extremity. Patient was given a postop shoe to wear when she is up and walking. Can discontinue immobilizer per Dr. Lorin Mercy.  Follow-Up Instructions: Return in about 4 weeks (around 09/10/2016).   Orders:  Orders Placed This Encounter  Procedures  . XR FEMUR MIN 2 VIEWS LEFT  . XR Foot Complete Left   No orders of the defined types were placed in this encounter.   Imaging: Xr Femur Min 2 Views Left  Result Date: 08/13/2016 X-ray left femur shows good alignment. Hardware intact.  Xr Foot Complete Left  Result Date: 08/13/2016 X-ray left foot shows that her metatarsal fractures are healing. Arterial calcification seen.   PMFS History: Patient Active Problem List   Diagnosis Date Noted  . Displaced supracondylar fracture with intracondylar extension of lower end of right femur, initial encounter for closed fracture ([place]) 07/05/2016  . CAD (coronary artery disease), native coronary artery 07/05/2016  . Chest pain   . Closed fracture of left distal femur ([place])   . Closed nondisplaced fracture of fourth metatarsal bone of left foot   . Closed nondisplaced fracture of third metatarsal bone of left foot   . Multifocal atrial tachycardia (HCC)   . Nondisplaced fracture of second metatarsal bone, left foot, initial encounter for closed fracture   . Closed displaced fracture of distal phalanx of  left great toe   . ESRD on hemodialysis ([place])   . Chronic diastolic heart failure ([place]) 05/23/2014  . Type 2 diabetes mellitus with renal manifestations ([employer]) 02/01/2014  . Hypertensive heart disease without CHF 11/10/2013  . Dementia 08/24/2013  . Frequent falls 08/24/2013  . Osteoarthritis 01/01/2013  . Hyperlipidemia 01/01/2013  . Hx of CABG 01/01/2013  . Anemia of chronic renal failure 10/05/2010   Past Medical History:  Diagnosis Date  . Absent pedal pulses   . Anemia of chronic renal failure 10/05/2010   Nice response to Procrit   . Anemia, iron deficiency 08/18/2012  . Ataxia involving legs    has had several falls  . CAD (coronary artery disease), native coronary artery 07/05/2016   3VD by cath 2008  01/22/07 CABG with LIMA to LAD< SVG to dx, SVG to OM1-2, SVG to PDA Dr. Roxan Hockey  . Chronic diastolic heart failure ([place]) 05/23/2014  . Dementia    "mild" per MD note  . Diabetes mellitus   . ESRD on hemodialysis ([place])   . Family history of anesthesia complication    Nephew- had time waking  . Hyperlipidemia   . Hypertensive heart disease without CHF 11/10/2013  . Neuropathy   . Osteoarthritis 01/01/2013  . Type 2 diabetes mellitus with renal manifestations ([place]) 02/01/2014    Family History  Problem Relation Age of Onset  . Diabetes Father   . Cancer Sister   .  Aneurysm Mother     Past Surgical History:  Procedure Laterality Date  . CATARACT EXTRACTION     cataract  . CORONARY ARTERY BYPASS GRAFT  2008  . EYE SURGERY    . FEMUR FRACTURE SURGERY    . FEMUR IM NAIL Left 07/05/2016   Procedure: INTRAMEDULLARY (IM) RETROGRADE FEMORAL NAILING LEFT;  Surgeon: Marybelle Killings, MD;  Location: [facility];  Service: Orthopedics;  Laterality: Left;  . INSERTION OF DIALYSIS CATHETER Left 09/05/2013   Procedure: INSERTION OF DIALYSIS CATHETER;  Surgeon: Conrad [place], MD;  Location: [facility];  Service: Vascular;  Laterality: Left;   Social History   Occupational History  . Not on file.    Social History Main Topics  . Smoking status: Never Smoker  . Smokeless tobacco: Never Used  . Alcohol use No  . Drug use: No  . Sexual activity: Not Currently    Birth control/ protection: Post-menopausal   Exam Today all staples were removed. Wounds healed without signs of infection. Left midfoot is tender. Bilateral calves are nontender.

## 2016-08-18 ENCOUNTER — Encounter (HOSPITAL_COMMUNITY): Payer: Self-pay | Admitting: Pharmacy Technician

## 2016-08-18 ENCOUNTER — Emergency Department (HOSPITAL_COMMUNITY)
Admission: EM | Admit: 2016-08-18 | Discharge: 2016-08-18 | Disposition: A | Discharge status: Home / Self Care | Payer: Medicare (Managed Care) | Attending: Emergency Medicine | Admitting: Emergency Medicine

## 2016-08-18 ENCOUNTER — Emergency Department (HOSPITAL_COMMUNITY): Payer: Medicare (Managed Care)

## 2016-08-18 DIAGNOSIS — Z7901 Long term (current) use of anticoagulants: Secondary | ICD-10-CM | POA: Insufficient documentation

## 2016-08-18 DIAGNOSIS — Z794 Long term (current) use of insulin: Secondary | ICD-10-CM | POA: Insufficient documentation

## 2016-08-18 DIAGNOSIS — E1122 Type 2 diabetes mellitus with diabetic chronic kidney disease: Secondary | ICD-10-CM | POA: Insufficient documentation

## 2016-08-18 DIAGNOSIS — I132 Hypertensive heart and chronic kidney disease with heart failure and with stage 5 chronic kidney disease, or end stage renal disease: Secondary | ICD-10-CM | POA: Diagnosis not present

## 2016-08-18 DIAGNOSIS — E1140 Type 2 diabetes mellitus with diabetic neuropathy, unspecified: Secondary | ICD-10-CM | POA: Diagnosis not present

## 2016-08-18 DIAGNOSIS — N186 End stage renal disease: Secondary | ICD-10-CM | POA: Diagnosis not present

## 2016-08-18 DIAGNOSIS — R110 Nausea: Secondary | ICD-10-CM

## 2016-08-18 DIAGNOSIS — I5032 Chronic diastolic (congestive) heart failure: Secondary | ICD-10-CM | POA: Diagnosis not present

## 2016-08-18 DIAGNOSIS — Z992 Dependence on renal dialysis: Secondary | ICD-10-CM | POA: Diagnosis not present

## 2016-08-18 DIAGNOSIS — Z79899 Other long term (current) drug therapy: Secondary | ICD-10-CM | POA: Diagnosis not present

## 2016-08-18 DIAGNOSIS — Z951 Presence of aortocoronary bypass graft: Secondary | ICD-10-CM | POA: Diagnosis not present

## 2016-08-18 DIAGNOSIS — R112 Nausea with vomiting, unspecified: Secondary | ICD-10-CM | POA: Diagnosis present

## 2016-08-18 DIAGNOSIS — I2510 Atherosclerotic heart disease of native coronary artery without angina pectoris: Secondary | ICD-10-CM | POA: Insufficient documentation

## 2016-08-18 DIAGNOSIS — E871 Hypo-osmolality and hyponatremia: Secondary | ICD-10-CM | POA: Diagnosis not present

## 2016-08-18 LAB — COMPREHENSIVE METABOLIC PANEL
ALK PHOS: 135 U/L — AB (ref 38–126)
ALT: 10 U/L — AB (ref 14–54)
ANION GAP: 11 (ref 5–15)
AST: 18 U/L (ref 15–41)
Albumin: 2.9 g/dL — ABNORMAL LOW (ref 3.5–5.0)
BUN: 48 mg/dL — ABNORMAL HIGH (ref 6–20)
CALCIUM: 8.7 mg/dL — AB (ref 8.9–10.3)
CO2: 30 mmol/L (ref 22–32)
CREATININE: 6.43 mg/dL — AB (ref 0.44–1.00)
Chloride: 88 mmol/L — ABNORMAL LOW (ref 101–111)
GFR calc non Af Amer: 5 mL/min — ABNORMAL LOW (ref >60)
GFR, EST AFRICAN AMERICAN: 6 mL/min — AB (ref >60)
Glucose, Bld: 147 mg/dL — ABNORMAL HIGH (ref 65–99)
Potassium: 4.6 mmol/L (ref 3.5–5.1)
Sodium: 129 mmol/L — ABNORMAL LOW (ref 135–145)
Total Bilirubin: 0.6 mg/dL (ref 0.3–1.2)
Total Protein: 6.2 g/dL — ABNORMAL LOW (ref 6.5–8.1)

## 2016-08-18 LAB — PROTIME-INR
INR: 2.34
Prothrombin Time: 26.1 seconds — ABNORMAL HIGH (ref 11.4–15.2)

## 2016-08-18 LAB — URINALYSIS, ROUTINE W REFLEX MICROSCOPIC
BILIRUBIN URINE: NEGATIVE
GLUCOSE, UA: NEGATIVE mg/dL
KETONES UR: NEGATIVE mg/dL
NITRITE: NEGATIVE
PH: 7 (ref 5.0–8.0)
Protein, ur: 30 mg/dL — AB
Specific Gravity, Urine: 1.018 (ref 1.005–1.030)

## 2016-08-18 LAB — CBC WITH DIFFERENTIAL/PLATELET
BASOS ABS: 0 10*3/uL (ref 0.0–0.1)
BASOS PCT: 0 %
EOS ABS: 0.1 10*3/uL (ref 0.0–0.7)
Eosinophils Relative: 1 %
HCT: 33.3 % — ABNORMAL LOW (ref 36.0–46.0)
HEMOGLOBIN: 10.4 g/dL — AB (ref 12.0–15.0)
Lymphocytes Relative: 16 %
Lymphs Abs: 1.3 10*3/uL (ref 0.7–4.0)
MCH: 27.7 pg (ref 26.0–34.0)
MCHC: 31.2 g/dL (ref 30.0–36.0)
MCV: 88.6 fL (ref 78.0–100.0)
Monocytes Absolute: 0.7 10*3/uL (ref 0.1–1.0)
Monocytes Relative: 9 %
NEUTROS PCT: 74 %
Neutro Abs: 5.7 10*3/uL (ref 1.7–7.7)
Platelets: 239 10*3/uL (ref 150–400)
RBC: 3.76 MIL/uL — ABNORMAL LOW (ref 3.87–5.11)
RDW: 16.8 % — ABNORMAL HIGH (ref 11.5–15.5)
WBC: 7.8 10*3/uL (ref 4.0–10.5)

## 2016-08-18 LAB — I-STAT CG4 LACTIC ACID, ED: Lactic Acid, Venous: 0.99 mmol/L (ref 0.5–1.9)

## 2016-08-18 NOTE — ED Notes (Signed)
[facility] called for transport.  

## 2016-08-18 NOTE — Discharge Instructions (Signed)
Go to Dialysis on Tuesday.  Get rechecked if you have any new or worrisome symptoms.

## 2016-08-18 NOTE — ED Triage Notes (Signed)
Pt from [facility] with complaints of low oxygen saturations. Per EMS, [facility] checked patient oxygen saturation approx 3 hours pta and was found to be at 79% on RA. Staff placed pt on 3L [place] and eventually called EMS for transport. EMS found pt to be 95% on RA. Pt with no complaints.

## 2016-08-18 NOTE — ED Provider Notes (Signed)
[facility] DEPT Provider Note   CSN: [id number] Arrival date & time: 08/18/16  1811     History   Chief Complaint Chief Complaint  Patient presents with  . Nausea  . Chills    HPI [name] is a 83 y.o. female.  The history is provided by the patient and the EMS personnel. No language interpreter was used.  Fever      [name] is a 83 y.o. female who presents to the Emergency Department complaining of fever/hypoxia.  She presents via EMS for evaluation of fever. History is provided by the patient and EMS. Per EMS she had a fever at rehabilitation facility But actual temperature is not available.  At the time she had a SPO2 of 79%. When EMS arrived they removed her nail polish and notices an SPO2 of 95% on room air. Patient states that she has had nausea and shaking chills since last night. She denies any cough, chest pain, shortness breath, abdominal pain she did have slight nausea no vomiting she does have occasional dysuria but she makes minimal urine. She was recently discharged following hospitalization for femur surgery. Symptoms are moderate and constant nature. She does have ESRD and is on dialysis Tuesday, Thursday, Saturday. Her last session was on Saturday and went without difficulties.  Past Medical History:  Diagnosis Date  . Absent pedal pulses   . Anemia of chronic renal failure 10/05/2010   Nice response to Procrit   . Anemia, iron deficiency 08/18/2012  . Ataxia involving legs    has had several falls  . CAD (coronary artery disease), native coronary artery 07/05/2016   3VD by cath 2008  01/22/07 CABG with LIMA to LAD< SVG to dx, SVG to OM1-2, SVG to PDA Dr. Roxan Hockey  . Chronic diastolic heart failure ([place]) 05/23/2014  . Dementia    "mild" per MD note  . Diabetes mellitus   . ESRD on hemodialysis ([place])   . Family history of anesthesia complication    Nephew- had time waking  . Hyperlipidemia   . Hypertensive heart disease without CHF  11/10/2013  . Neuropathy   . Osteoarthritis 01/01/2013  . Type 2 diabetes mellitus with renal manifestations ([place]) 02/01/2014    Patient Active Problem List   Diagnosis Date Noted  . Displaced supracondylar fracture with intracondylar extension of lower end of right femur, initial encounter for closed fracture ([place]) 07/05/2016  . CAD (coronary artery disease), native coronary artery 07/05/2016  . Chest pain   . Closed fracture of left distal femur ([place])   . Closed nondisplaced fracture of fourth metatarsal bone of left foot   . Closed nondisplaced fracture of third metatarsal bone of left foot   . Multifocal atrial tachycardia (HCC)   . Nondisplaced fracture of second metatarsal bone, left foot, initial encounter for closed fracture   . Closed displaced fracture of distal phalanx of left great toe   . ESRD on hemodialysis ([place])   . Chronic diastolic heart failure ([place]) 05/23/2014  . Type 2 diabetes mellitus with renal manifestations ([place]) 02/01/2014  . Hypertensive heart disease without CHF 11/10/2013  . Dementia 08/24/2013  . Frequent falls 08/24/2013  . Osteoarthritis 01/01/2013  . Hyperlipidemia 01/01/2013  . Hx of CABG 01/01/2013  . Anemia of chronic renal failure 10/05/2010    Past Surgical History:  Procedure Laterality Date  . CATARACT EXTRACTION     cataract  . CORONARY ARTERY BYPASS GRAFT  2008  . EYE SURGERY    .  FEMUR FRACTURE SURGERY    . FEMUR IM NAIL Left 07/05/2016   Procedure: INTRAMEDULLARY (IM) RETROGRADE FEMORAL NAILING LEFT;  Surgeon: Marybelle Killings, MD;  Location: [facility];  Service: Orthopedics;  Laterality: Left;  . INSERTION OF DIALYSIS CATHETER Left 09/05/2013   Procedure: INSERTION OF DIALYSIS CATHETER;  Surgeon: Conrad Gillett, MD;  Location: [facility];  Service: Vascular;  Laterality: Left;    OB History    No data available       Home Medications    Prior to Admission medications   Medication Sig Start Date End Date Taking? Authorizing Provider    acetaminophen-codeine (TYLENOL #3) 300-30 MG tablet Take 1 tablet by mouth every 6 (six) hours as needed for moderate pain. 07/09/16   Rai, Vernelle Emerald, MD  amiodarone (PACERONE) 200 MG tablet Take 1 tablet (200 mg total) by mouth daily. 07/09/16   Rai, Vernelle Emerald, MD  calcitRIOL (ROCALTROL) 0.5 MCG capsule Take 1 capsule (0.5 mcg total) by mouth Every Tuesday,Thursday,and Saturday with dialysis. 07/10/16   Rai, Vernelle Emerald, MD  carvedilol (COREG) 6.25 MG tablet Take 1 tablet (6.25 mg total) by mouth 2 (two) times daily with a meal. 07/09/16   Rai, Ripudeep K, MD  donepezil (ARICEPT) 10 MG tablet Take 5 mg by mouth at bedtime. For MEMORY    [provider]  enoxaparin (LOVENOX) 80 MG/0.8ML injection Inject 0.7 mLs (70 mg total) into the skin daily. Please continue until INR above 2 then stop 07/10/16   Rai, Ripudeep K, MD  erythromycin ophthalmic ointment Place 1 application into both eyes at bedtime. For keratitis    [provider]  ferrous sulfate 325 (65 FE) MG tablet Take 325 mg by mouth 2 (two) times daily. As SUPPLEMENT    [provider]  folic acid-vitamin b complex-vitamin c-selenium-zinc (DIALYVITE) 3 MG TABS tablet Take 1 tablet by mouth daily.    [provider]  gabapentin (NEURONTIN) 300 MG capsule Take 300 mg by mouth at bedtime. For NEUROPATHY/NERVE PAIN    [provider]  insulin aspart (NOVOLOG) 100 UNIT/ML injection Inject 0-9 Units into the skin 3 (three) times daily with meals. Sliding scale CBG 70 - 120: 0 units CBG 121 - 150: 1 unit,  CBG 151 - 200: 2 units,  CBG 201 - 250: 3 units,  CBG 251 - 300: 5 units,  CBG 301 - 350: 7 units,  CBG 351 - 400: 9 units   CBG > 400: 9 units and notify your MD 07/09/16   Rai, Vernelle Emerald, MD  insulin glargine (LANTUS) 100 UNIT/ML injection Inject 0.05 mLs (5 Units total) into the skin at bedtime. 07/09/16   Rai, Vernelle Emerald, MD  ketoconazole (NIZORAL) 2 % cream Apply 1 application topically daily.    [provider]  lidocaine-prilocaine (EMLA) cream Apply 1 application topically as needed (On Tues, Thu,and Sat.).    [provider]  loperamide (IMODIUM A-D) 2 MG tablet Take 2 mg by mouth 4 (four) times daily as needed for diarrhea or loose stools.    [provider]  menthol-cetylpyridinium (CEPACOL) 3 MG lozenge Take 1 lozenge by mouth as needed for sore throat.    [provider]  multivitamin (RENA-VIT) TABS tablet Take 1 tablet by mouth at bedtime. 07/09/16   Rai, Vernelle Emerald, MD  Nutritional Supplements (FEEDING SUPPLEMENT, NEPRO CARB STEADY,) LIQD Take 237 mLs by mouth 2 (two) times daily between meals. 07/09/16   Mendel Corning, MD  ondansetron (ZOFRAN) 4 MG tablet Take 4 mg by mouth every 8 (eight) hours as needed for nausea or vomiting.    [provider]  Propylene Glycol (SYSTANE BALANCE) 0.6 % SOLN Apply 1 drop to eye 2 (two) times daily.    [provider]  rOPINIRole (REQUIP) 1 MG tablet Take 1 mg by mouth 2 (two) times daily.    [provider]  rosuvastatin (CRESTOR) 5 MG tablet Take 5 mg by mouth at bedtime. For CHOLESTEROL    [provider]  sevelamer carbonate (RENVELA) 800 MG tablet Take 1 tablet (800 mg total) by mouth 3 (three) times daily with meals. Patient not taking: Reported on 07/04/2016 08/27/13   Kathie Dike, MD  warfarin (COUMADIN) 4 MG tablet Take 1 tablet (4 mg total) by mouth daily at 6 PM. Please adjust dose according to PT/INR, continue for 3 months 07/10/16   Rai, Vernelle Emerald, MD    Family History Family History  Problem Relation Age of Onset  . Diabetes Father   . Cancer Sister   . Aneurysm Mother     Social History Social History  Substance Use Topics  . Smoking status: Never Smoker  . Smokeless tobacco: Never Used  . Alcohol use No     Allergies   Patient has no known allergies.   Review of Systems Review of Systems  Constitutional: Positive for fever.  All other systems  reviewed and are negative.    Physical Exam Updated Vital Signs BP (!) 138/55   Pulse 64   Temp 98.3 F (36.8 C) (Oral)   Resp (!) 27   SpO2 95%   Physical Exam  Constitutional: She is oriented to person, place, and time. She appears well-developed and well-nourished.  HENT:  Head: Normocephalic and atraumatic.  Eyes:  Cataracts bilaterally, left greater than right  Cardiovascular: Normal rate and regular rhythm.   Murmur heard. Systolic ejection murmur  Pulmonary/Chest: Effort normal. No respiratory distress.  Occasional rhonchi bilaterally  Abdominal: Soft. There is no tenderness. There is no rebound and no guarding.  Musculoskeletal:  Fistula in the left upper extremity with palpable thrill. Moderate swelling to the left thigh and knee without any significant erythema or tenderness. Surgical wounds to the left lower extremity are healing well.    Neurological: She is alert and oriented to person, place, and time.  Skin: Skin is warm and dry.  Psychiatric: She has a normal mood and affect. Her behavior is normal.  Nursing note and vitals reviewed.    ED Treatments / Results  Labs (all labs ordered are listed, but only abnormal results are displayed) Labs Reviewed  COMPREHENSIVE METABOLIC PANEL - Abnormal; Notable for the following:       Result Value   Sodium 129 (*)    Chloride 88 (*)    Glucose, Bld 147 (*)    BUN 48 (*)    Creatinine, Ser 6.43 (*)    Calcium 8.7 (*)    Total Protein 6.2 (*)    Albumin 2.9 (*)    ALT 10 (*)    Alkaline Phosphatase 135 (*)    GFR calc non Af Amer 5 (*)    GFR calc Af Amer 6 (*)    All other components within normal limits  CBC WITH DIFFERENTIAL/PLATELET - Abnormal; Notable for the following:    RBC 3.76 (*)    Hemoglobin 10.4 (*)    HCT 33.3 (*)    RDW 16.8 (*)    All  other components within normal limits  URINALYSIS, ROUTINE W REFLEX MICROSCOPIC - Abnormal; Notable for the following:    Color, Urine AMBER (*)     APPearance CLOUDY (*)    Hgb urine dipstick SMALL (*)    Protein, ur 30 (*)    Leukocytes, UA TRACE (*)    Bacteria, UA FEW (*)    Squamous Epithelial / LPF TOO NUMEROUS TO COUNT (*)    All other components within normal limits  PROTIME-INR - Abnormal; Notable for the following:    Prothrombin Time 26.1 (*)    All other components within normal limits  URINE CULTURE  I-STAT CG4 LACTIC ACID, ED  I-STAT CG4 LACTIC ACID, ED    EKG  EKG Interpretation  Date/Time:  Monday Aug 18 2016 19:37:44 EDT Ventricular Rate:  62 PR Interval:    QRS Duration: 84 QT Interval:  433 QTC Calculation: 440 R Axis:   -3 Text Interpretation:  Sinus rhythm Low voltage, precordial leads Confirmed by Hazle Coca [phone]) on 08/18/2016 7:52:14 PM       Radiology Dg Chest 2 View  Result Date: 08/18/2016 CLINICAL DATA:  83 y/o  F; shortness of breath. EXAM: CHEST  2 VIEW COMPARISON:  07/08/2016 chest radiograph FINDINGS: Stable mild cardiomegaly given projection and technique. Pulmonary vascular congestion. No focal consolidation. No pleural effusion or pneumothorax. Stable postsurgical changes related to CABG. Right upper quadrant cholecystectomy clips. Degenerative changes of the right shoulder joint. IMPRESSION: Stable cardiomegaly.  Pulmonary vascular congestion. Electronically Signed   By: Kristine Garbe M.D.   On: 08/18/2016 19:17    Procedures Procedures (including critical care time)  Medications Ordered in ED Medications - No data to display   Initial Impression / Assessment and Plan / ED Course  I have reviewed the triage vital signs and the nursing notes.  Pertinent labs & imaging results that were available during my care of the patient were reviewed by me and considered in my medical decision making (see chart for details).     Patient with hx/o CKD, recent orthopedic surgery here for evaluation of chills, report of possible hypoxia at her rehabilitation facility. She has no  symptoms in the emergency department. She has no hypoxia or respiratory distress. No evidence of acute pneumonia or acute infectious process. She is currently on Coumadin. Current clinical picture is not consistent with PE. Her leg appears to be healing well and there is no evidence of acute infection. Plan to DC home with outpatient follow-up and return precautions.  Final Clinical Impressions(s) / ED Diagnoses   Final diagnoses:  Nausea  Hyponatremia  ESRD (end stage renal disease) on dialysis [facility])    New Prescriptions Discharge Medication List as of 08/18/2016 11:31 PM       Quintella Reichert, MD 08/19/16 0109

## 2016-08-18 NOTE — ED Notes (Signed)
Pt reporting to EDP nausea x1 day with 1 episode of self-induced emesis. Pt also reports chills over past 24hrs.

## 2016-08-18 NOTE — ED Notes (Signed)
Attempted to gain IV access and blood draw, twice, but without success.

## 2016-08-20 LAB — URINE CULTURE

## 2016-09-10 ENCOUNTER — Ambulatory Visit (INDEPENDENT_AMBULATORY_CARE_PROVIDER_SITE_OTHER): Payer: Medicare (Managed Care) | Admitting: Podiatry

## 2016-09-10 NOTE — Progress Notes (Signed)
   Subjective:    Patient ID: [name], female    DOB: 06/07/1930, 83 y.o.   MRN: [id number]  HPI No show   Review of Systems     Objective:   Physical Exam        Assessment & Plan:

## 2016-09-12 ENCOUNTER — Ambulatory Visit (INDEPENDENT_AMBULATORY_CARE_PROVIDER_SITE_OTHER): Payer: Medicare (Managed Care)

## 2016-09-12 ENCOUNTER — Encounter (INDEPENDENT_AMBULATORY_CARE_PROVIDER_SITE_OTHER): Payer: Self-pay | Admitting: Orthopaedic Surgery

## 2016-09-12 ENCOUNTER — Ambulatory Visit (INDEPENDENT_AMBULATORY_CARE_PROVIDER_SITE_OTHER): Payer: Medicare (Managed Care) | Admitting: Orthopaedic Surgery

## 2016-09-12 ENCOUNTER — Other Ambulatory Visit (HOSPITAL_COMMUNITY): Payer: Self-pay | Admitting: Nephrology

## 2016-09-12 DIAGNOSIS — M79672 Pain in left foot: Secondary | ICD-10-CM

## 2016-09-12 DIAGNOSIS — S92325A Nondisplaced fracture of second metatarsal bone, left foot, initial encounter for closed fracture: Secondary | ICD-10-CM

## 2016-09-12 DIAGNOSIS — N186 End stage renal disease: Secondary | ICD-10-CM

## 2016-09-12 DIAGNOSIS — S72461A Displaced supracondylar fracture with intracondylar extension of lower end of right femur, initial encounter for closed fracture: Secondary | ICD-10-CM

## 2016-09-12 DIAGNOSIS — Z992 Dependence on renal dialysis: Principal | ICD-10-CM

## 2016-09-12 DIAGNOSIS — S92335G Nondisplaced fracture of third metatarsal bone, left foot, subsequent encounter for fracture with delayed healing: Secondary | ICD-10-CM

## 2016-09-12 NOTE — Progress Notes (Signed)
Office Visit Note   Patient: [name]           Date of Birth: 09/16/1930           MRN: [id number] Visit Date: 09/12/2016              Requested by: Jani Gravel, [address] [place] [address], [place] [id number] PCP: Jani Gravel, MD   Assessment & Plan: Visit Diagnoses:  1. Displaced supracondylar fracture with intracondylar extension of lower end of right femur, initial encounter for closed fracture ([place])   2. Closed nondisplaced fracture of third metatarsal bone of left foot with delayed healing, subsequent encounter   3. Nondisplaced fracture of second metatarsal bone, left foot, initial encounter for closed fracture   4. Pain in left foot     Plan: We will begin partial weightbearing left lower extremity and I will recheck her again in one month for repeat x-rays. Weightbearing status is 50% weightbearing left lower extremity.  Follow-Up Instructions: No Follow-up on file.   Orders:  Orders Placed This Encounter  Procedures  . XR Foot Complete Left  . XR FEMUR MIN 2 VIEWS LEFT   No orders of the defined types were placed in this encounter.     Procedures: No procedures performed   Clinical Data: No additional findings.   Subjective: Chief Complaint  Patient presents with  . Left Leg - Routine Post Op  . Left Foot - Fracture, Follow-up    HPI patient returns for follow-up now 2 months out from supracondylar femur fracture fixed with a retrograde nail and also multiple left foot fractures. She has a of skin breakdown on her sacrum also left heel left heel is improved. She can stop the postop shoe on the left foot and use a regular shoe. She'll be 50% weightbearing left lower extremity with physical therapy. She'll work on transfers and progressive mobility. Return one month for x-rays of left distal femur AP and lateral distal femur only. Hopefully we can increase her weightbearing to full weightbearing at that time.  Review of  Systems   Objective: Vital Signs: BP (!) 146/63   Pulse (!) 59   Physical Exam  Ortho Exam  Specialty Comments:  No specialty comments available.  Imaging: No results found.   PMFS History: Patient Active Problem List   Diagnosis Date Noted  . Displaced supracondylar fracture with intracondylar extension of lower end of right femur, initial encounter for closed fracture ([place]) 07/05/2016  . CAD (coronary artery disease), native coronary artery 07/05/2016  . Chest pain   . Closed fracture of left distal femur ([place])   . Closed nondisplaced fracture of fourth metatarsal bone of left foot   . Closed nondisplaced fracture of third metatarsal bone of left foot   . Multifocal atrial tachycardia (HCC)   . Nondisplaced fracture of second metatarsal bone, left foot, initial encounter for closed fracture   . Closed displaced fracture of distal phalanx of left great toe   . ESRD on hemodialysis ([place])   . Chronic diastolic heart failure ([place]) 05/23/2014  . Type 2 diabetes mellitus with renal manifestations ([place]) 02/01/2014  . Hypertensive heart disease without CHF 11/10/2013  . Dementia 08/24/2013  . Frequent falls 08/24/2013  . Osteoarthritis 01/01/2013  . Hyperlipidemia 01/01/2013  . Hx of CABG 01/01/2013  . Anemia of chronic renal failure 10/05/2010   Past Medical History:  Diagnosis Date  . Absent pedal pulses   . Anemia of chronic renal failure  10/05/2010   Nice response to Procrit   . Anemia, iron deficiency 08/18/2012  . Ataxia involving legs    has had several falls  . CAD (coronary artery disease), native coronary artery 07/05/2016   3VD by cath 2008  01/22/07 CABG with LIMA to LAD< SVG to dx, SVG to OM1-2, SVG to PDA Dr. Roxan Hockey  . Chronic diastolic heart failure ([place]) 05/23/2014  . Dementia    "mild" per MD note  . Diabetes mellitus   . ESRD on hemodialysis ([place])   . Family history of anesthesia complication    Nephew- had time waking  . Hyperlipidemia   .  Hypertensive heart disease without CHF 11/10/2013  . Neuropathy   . Osteoarthritis 01/01/2013  . Type 2 diabetes mellitus with renal manifestations ([place]) 02/01/2014    Family History  Problem Relation Age of Onset  . Diabetes Father   . Cancer Sister   . Aneurysm Mother     Past Surgical History:  Procedure Laterality Date  . CATARACT EXTRACTION     cataract  . CORONARY ARTERY BYPASS GRAFT  2008  . EYE SURGERY    . FEMUR FRACTURE SURGERY    . FEMUR IM NAIL Left 07/05/2016   Procedure: INTRAMEDULLARY (IM) RETROGRADE FEMORAL NAILING LEFT;  Surgeon: Marybelle Killings, MD;  Location: [facility];  Service: Orthopedics;  Laterality: Left;  . INSERTION OF DIALYSIS CATHETER Left 09/05/2013   Procedure: INSERTION OF DIALYSIS CATHETER;  Surgeon: Conrad Chester, MD;  Location: [facility];  Service: Vascular;  Laterality: Left;   Social History   Occupational History  . Not on file.   Social History Main Topics  . Smoking status: Never Smoker  . Smokeless tobacco: Never Used  . Alcohol use No  . Drug use: No  . Sexual activity: Not Currently    Birth control/ protection: Post-menopausal

## 2016-09-15 ENCOUNTER — Ambulatory Visit (HOSPITAL_COMMUNITY)
Admission: RE | Admit: 2016-09-15 | Discharge: 2016-09-15 | Disposition: A | Discharge status: Home / Self Care | Payer: Medicare (Managed Care) | Source: Ambulatory Visit | Attending: Nephrology | Admitting: Nephrology

## 2016-09-15 ENCOUNTER — Encounter (HOSPITAL_COMMUNITY): Payer: Self-pay | Admitting: Interventional Radiology

## 2016-09-15 DIAGNOSIS — Z4901 Encounter for fitting and adjustment of extracorporeal dialysis catheter: Secondary | ICD-10-CM | POA: Diagnosis not present

## 2016-09-15 DIAGNOSIS — Z992 Dependence on renal dialysis: Secondary | ICD-10-CM

## 2016-09-15 DIAGNOSIS — N186 End stage renal disease: Secondary | ICD-10-CM

## 2016-09-15 HISTORY — PX: IR DIALY SHUNT INTRO NEEDLE/INTRACATH INITIAL W/IMG LEFT: IMG6102

## 2016-09-15 MED ORDER — IOPAMIDOL (ISOVUE-300) INJECTION 61%
INTRAVENOUS | Status: AC
  Administered 2016-09-15: 22 mL
  Filled 2016-09-15: qty 100

## 2016-10-15 ENCOUNTER — Ambulatory Visit (INDEPENDENT_AMBULATORY_CARE_PROVIDER_SITE_OTHER): Payer: Medicare (Managed Care) | Admitting: Orthopaedic Surgery

## 2016-10-21 LAB — POCT INR: INR: 1.8 — AB (ref 0.9–1.1)

## 2016-10-21 LAB — PROTIME-INR: Protime: 20 — AB (ref 10.0–13.8)

## 2016-10-29 ENCOUNTER — Encounter (INDEPENDENT_AMBULATORY_CARE_PROVIDER_SITE_OTHER): Payer: Self-pay | Admitting: Orthopaedic Surgery

## 2016-10-29 ENCOUNTER — Ambulatory Visit (INDEPENDENT_AMBULATORY_CARE_PROVIDER_SITE_OTHER): Payer: Medicare (Managed Care)

## 2016-10-29 ENCOUNTER — Ambulatory Visit (INDEPENDENT_AMBULATORY_CARE_PROVIDER_SITE_OTHER): Payer: Medicare (Managed Care) | Admitting: Orthopaedic Surgery

## 2016-10-29 VITALS — BP 143/62 | HR 59 | Temp 97.8°F | Ht 66.0 in | Wt 164.0 lb

## 2016-10-29 DIAGNOSIS — S92335G Nondisplaced fracture of third metatarsal bone, left foot, subsequent encounter for fracture with delayed healing: Secondary | ICD-10-CM

## 2016-10-29 DIAGNOSIS — S72461A Displaced supracondylar fracture with intracondylar extension of lower end of right femur, initial encounter for closed fracture: Secondary | ICD-10-CM

## 2016-10-29 DIAGNOSIS — S92345G Nondisplaced fracture of fourth metatarsal bone, left foot, subsequent encounter for fracture with delayed healing: Secondary | ICD-10-CM | POA: Diagnosis not present

## 2016-10-29 DIAGNOSIS — M79671 Pain in right foot: Secondary | ICD-10-CM

## 2016-10-29 DIAGNOSIS — S92325A Nondisplaced fracture of second metatarsal bone, left foot, initial encounter for closed fracture: Secondary | ICD-10-CM

## 2016-10-29 NOTE — Progress Notes (Signed)
Office Visit Note   Patient: [name]           Date of Birth: 1930-06-01           MRN: [id number] Visit Date: 10/29/2016              Requested by: Jani Gravel, [place] [place] [place], [place] [address] PCP: Jani Gravel, MD   Assessment & Plan: Visit Diagnoses:  1. Nondisplaced fracture of second metatarsal bone, left foot, initial encounter for closed fracture   2. Displaced supracondylar fracture with intracondylar extension of lower end of right femur, initial encounter for closed fracture ([place])   3. Closed nondisplaced fracture of fourth metatarsal bone of left foot with delayed healing, subsequent encounter   4. Closed nondisplaced fracture of third metatarsal bone of left foot with delayed healing, subsequent encounter   5. Pain in right foot     Plan:  Okay for patient start weightbearing as tolerated with physical therapy. Patient will follow up in the office in 4 weeks for recheck.    Follow-Up Instructions: Return in about 4 weeks (around 11/26/2016).   Orders:  Orders Placed This Encounter  Procedures  . XR Foot Complete Left  . XR FEMUR MIN 2 VIEWS LEFT  . XR Foot Complete Right   No orders of the defined types were placed in this encounter.     Procedures: No procedures performed   Clinical Data: No additional findings.   Subjective: Chief Complaint  Patient presents with  . Left Foot - Follow-up  . Left Leg - Follow-up    07/05/16 IM retrograde femoral nailing    HPI  patient returns. States that left knee these have some soreness but improving. Left foot also improving. Patient complaining of some right midfoot and plantar pain. No injury. She is still at the skilled facility and 50% partial weightbearing left leg. Review of Systems  Respiratory: Negative.   Cardiovascular: Negative.   Musculoskeletal: Positive for gait problem and joint swelling.    no current cardiopulmonary GI GU issues.  Objective: Vital Signs:  BP (!) 143/62 (BP Location: Right Arm, Patient Position: Sitting, Cuff Size: Normal)   Pulse (!) 59   Temp 97.8 F (36.6 C) (Oral)   Ht 5\' 6"  (1.676 m)   Wt 164 lb (74.4 kg)   BMI 26.47 kg/m   Physical Exam  Constitutional: No distress.  HENT:  Head: Normocephalic.  Eyes: Pupils are equal, round, and reactive to light. EOM are normal.  Pulmonary/Chest: No respiratory distress.  Musculoskeletal:  Negative logroll left hip. Left knee joint line tender. Positive crepitus. Calf nontender. Does have bilateral midfoot tenderness.  Skin: Skin is warm and dry.    Ortho Exam  Specialty Comments:  No specialty comments available.  Imaging: Xr Femur Min 2 Views Left  Result Date: 10/29/2016 X-ray left femur shows that her IM nail and distal screws are intact. Good alignment of the fracture. Good healing of the fracture. X-rays reviewed with Dr. Lorin Mercy.  Xr Foot Complete Left  Result Date: 10/29/2016 X-ray left foot shows metatarsal fractures are healed. No acute findings. X-rays reviewed with Dr. Lorin Mercy.  Xr Foot Complete Right  Result Date: 10/29/2016 X-ray right foot shows ankle DJD and midfoot degenerative changes. No acute fracture. Patient does have old healed fracture of the fifth metatarsal.    PMFS History: Patient Active Problem List   Diagnosis Date Noted  . Displaced supracondylar fracture with intracondylar extension of lower end  of right femur, initial encounter for closed fracture ([place]) 07/05/2016  . CAD (coronary artery disease), native coronary artery 07/05/2016  . Chest pain   . Closed fracture of left distal femur ([place])   . Closed nondisplaced fracture of fourth metatarsal bone of left foot   . Closed nondisplaced fracture of third metatarsal bone of left foot   . Multifocal atrial tachycardia (HCC)   . Nondisplaced fracture of second metatarsal bone, left foot, initial encounter for closed fracture   . Closed displaced fracture of distal phalanx of left great toe    . ESRD on hemodialysis ([place])   . Chronic diastolic heart failure ([place]) 05/23/2014  . Type 2 diabetes mellitus with renal manifestations ([place]) 02/01/2014  . Hypertensive heart disease without CHF 11/10/2013  . Dementia 08/24/2013  . Frequent falls 08/24/2013  . Osteoarthritis 01/01/2013  . Hyperlipidemia 01/01/2013  . Hx of CABG 01/01/2013  . Anemia of chronic renal failure 10/05/2010   Past Medical History:  Diagnosis Date  . Absent pedal pulses   . Anemia of chronic renal failure 10/05/2010   Nice response to Procrit   . Anemia, iron deficiency 08/18/2012  . Ataxia involving legs    has had several falls  . CAD (coronary artery disease), native coronary artery 07/05/2016   3VD by cath 2008  01/22/07 CABG with LIMA to LAD< SVG to dx, SVG to OM1-2, SVG to PDA Dr. Roxan Hockey  . Chronic diastolic heart failure ([place]) 05/23/2014  . Dementia    "mild" per MD note  . Diabetes mellitus   . ESRD on hemodialysis ([place])   . Family history of anesthesia complication    Nephew- had time waking  . Hyperlipidemia   . Hypertensive heart disease without CHF 11/10/2013  . Neuropathy   . Osteoarthritis 01/01/2013  . Type 2 diabetes mellitus with renal manifestations ([place]) 02/01/2014    Family History  Problem Relation Age of Onset  . Diabetes Father   . Cancer Sister   . Aneurysm Mother     Past Surgical History:  Procedure Laterality Date  . CATARACT EXTRACTION     cataract  . CORONARY ARTERY BYPASS GRAFT  2008  . EYE SURGERY    . FEMUR FRACTURE SURGERY    . FEMUR IM NAIL Left 07/05/2016   Procedure: INTRAMEDULLARY (IM) RETROGRADE FEMORAL NAILING LEFT;  Surgeon: Marybelle Killings, MD;  Location: [facility];  Service: Orthopedics;  Laterality: Left;  . INSERTION OF DIALYSIS CATHETER Left 09/05/2013   Procedure: INSERTION OF DIALYSIS CATHETER;  Surgeon: Conrad Ethridge, MD;  Location: [facility];  Service: Vascular;  Laterality: Left;  . IR DIALY SHUNT INTRO [place] W/IMG LEFT Left 09/15/2016    Social History   Occupational History  . Not on file.   Social History Main Topics  . Smoking status: Never Smoker  . Smokeless tobacco: Never Used  . Alcohol use No  . Drug use: No  . Sexual activity: Not Currently    Birth control/ protection: Post-menopausal

## 2016-10-31 ENCOUNTER — Telehealth (INDEPENDENT_AMBULATORY_CARE_PROVIDER_SITE_OTHER): Payer: Self-pay | Admitting: Orthopaedic Surgery

## 2016-10-31 NOTE — Telephone Encounter (Signed)
Records faxed to [facility] [name]

## 2016-11-04 ENCOUNTER — Telehealth (INDEPENDENT_AMBULATORY_CARE_PROVIDER_SITE_OTHER): Payer: Self-pay | Admitting: Radiology

## 2016-11-04 NOTE — Telephone Encounter (Signed)
I called patient and discussed. Patient states that therapist at the skilled facility has told her she is not ready to be home by herself at this time. She staying at [facility]. She states he wants her to go [facility] on the day she doesn't go to dialysis. She states she pays money to [facility] on a monthly basis for insurance recommend that she call patient and discuss this with them since she says they were the ones that told her she is ready to go home tomorrow.FYI

## 2016-11-04 NOTE — Telephone Encounter (Signed)
Patient called yesterday at 4:40 pm, and stated that she is very upset that [facility] is telling her she will be going home from [place]. [facility] wants [facility] to go to PT there with them.  She says their PT is no good.  She says you gave her permission to weight bear 100% last week, but she is not able to walk unassisted, and is not comfortable to go home at this point and be by herself.  She says they will get in home care but she will still be by herself some.  PT at [facility] has not released her, it is [facility] that is telling her she will go home Wed. 11/05/16.  Is there anything we can do to help her in this situation?

## 2016-11-20 NOTE — Addendum Note (Signed)
Addendum  created 11/20/16 1244 by Roberts Gaudy, MD   Sign clinical note

## 2016-12-02 ENCOUNTER — Ambulatory Visit (INDEPENDENT_AMBULATORY_CARE_PROVIDER_SITE_OTHER): Payer: Medicare (Managed Care) | Admitting: Orthopaedic Surgery

## 2016-12-03 ENCOUNTER — Encounter (INDEPENDENT_AMBULATORY_CARE_PROVIDER_SITE_OTHER): Payer: Self-pay | Admitting: Surgery

## 2016-12-03 ENCOUNTER — Ambulatory Visit (INDEPENDENT_AMBULATORY_CARE_PROVIDER_SITE_OTHER): Payer: Medicare (Managed Care) | Admitting: Orthopaedic Surgery

## 2016-12-03 ENCOUNTER — Ambulatory Visit (INDEPENDENT_AMBULATORY_CARE_PROVIDER_SITE_OTHER): Payer: Medicare (Managed Care) | Admitting: Surgery

## 2016-12-03 DIAGNOSIS — S92325A Nondisplaced fracture of second metatarsal bone, left foot, initial encounter for closed fracture: Secondary | ICD-10-CM

## 2016-12-03 DIAGNOSIS — S92335G Nondisplaced fracture of third metatarsal bone, left foot, subsequent encounter for fracture with delayed healing: Secondary | ICD-10-CM

## 2016-12-03 DIAGNOSIS — S72461A Displaced supracondylar fracture with intracondylar extension of lower end of right femur, initial encounter for closed fracture: Secondary | ICD-10-CM | POA: Diagnosis not present

## 2016-12-03 DIAGNOSIS — S92345G Nondisplaced fracture of fourth metatarsal bone, left foot, subsequent encounter for fracture with delayed healing: Secondary | ICD-10-CM

## 2016-12-03 NOTE — Progress Notes (Signed)
Office Visit Note   Patient: [name]           Date of Birth: Apr 22, 1930           MRN: [id number] Visit Date: 12/03/2016              Requested by: Jani Gravel, [address] [place], [place] [address] PCP: Jani Gravel, MD   Assessment & Plan: Visit Diagnoses:  1. Nondisplaced fracture of second metatarsal bone, left foot, initial encounter for closed fracture   2. Displaced supracondylar fracture with intracondylar extension of lower end of right femur, initial encounter for closed fracture ([place])   3. Closed nondisplaced fracture of fourth metatarsal bone of left foot with delayed healing, subsequent encounter   4. Closed nondisplaced fracture of third metatarsal bone of left foot with delayed healing, subsequent encounter     Plan: I'm concerned about patient's safety issues living by herself. We'll see if we can arrange home health PT to go out and work with her. They can also assess home safety.follow up with Dr. Lorin Mercy in a few weeks for recheck.  Follow-Up Instructions: Return in about 6 weeks (around 01/14/2017).   Orders:  No orders of the defined types were placed in this encounter.  No orders of the defined types were placed in this encounter.     Procedures: No procedures performed   Clinical Data: No additional findings.   Subjective: Chief Complaint  Patient presents with  . Left Foot - Follow-up  . Right Foot - Follow-up  . Left Knee - Pain, Edema    HPI Patient returns. States that she is doing well. She was discharged from skilled facility and is back at home. States that she does live alone. Does not have any family members around to help her. Couple of friends come by. States that [facility] since somebody out for a few hours during the day but otherwise is at home by herself. States that [facility] to outpatient PT visits. Does not have home health therapy. States that she does not use her walker much due to  having rugs throughout the home and the walker that she has makes her feel unsafe. Uses a wheelchair. Review of Systems No current cardiac pulmonary GI GU issues.  Objective: Vital Signs: There were no vitals taken for this visit.  Physical Exam  HENT:  Head: Normocephalic.  Eyes: Pupils are equal, round, and reactive to light.  Neck: Normal range of motion.  Musculoskeletal:  Patient seated in a wheelchair. Good painless range of motion bilateral hips. Left knee good range of motion. Knee and left foot nontender.  Skin: Skin is warm and dry.    Ortho Exam  Specialty Comments:  No specialty comments available.  Imaging: No results found.   PMFS History: Patient Active Problem List   Diagnosis Date Noted  . Displaced supracondylar fracture with intracondylar extension of lower end of right femur, initial encounter for closed fracture ([place]) 07/05/2016  . CAD (coronary artery disease), native coronary artery 07/05/2016  . Chest pain   . Closed fracture of left distal femur ([place])   . Closed nondisplaced fracture of fourth metatarsal bone of left foot   . Closed nondisplaced fracture of third metatarsal bone of left foot   . Multifocal atrial tachycardia (HCC)   . Nondisplaced fracture of second metatarsal bone, left foot, initial encounter for closed fracture   . Closed displaced fracture of distal phalanx  of left great toe   . ESRD on hemodialysis ([place])   . Chronic diastolic heart failure ([place]) 05/23/2014  . Type 2 diabetes mellitus with renal manifestations ([place]) 02/01/2014  . Hypertensive heart disease without CHF 11/10/2013  . Dementia 08/24/2013  . Frequent falls 08/24/2013  . Osteoarthritis 01/01/2013  . Hyperlipidemia 01/01/2013  . Hx of CABG 01/01/2013  . Anemia of chronic renal failure 10/05/2010   Past Medical History:  Diagnosis Date  . Absent pedal pulses   . Anemia of chronic renal failure 10/05/2010   Nice response to Procrit   . Anemia, iron deficiency  08/18/2012  . Ataxia involving legs    has had several falls  . CAD (coronary artery disease), native coronary artery 07/05/2016   3VD by cath 2008  01/22/07 CABG with LIMA to LAD< SVG to dx, SVG to OM1-2, SVG to PDA Dr. Roxan Hockey  . Chronic diastolic heart failure ([place]) 05/23/2014  . Dementia    "mild" per MD note  . Diabetes mellitus   . ESRD on hemodialysis ([place])   . Family history of anesthesia complication    Nephew- had time waking  . Hyperlipidemia   . Hypertensive heart disease without CHF 11/10/2013  . Neuropathy   . Osteoarthritis 01/01/2013  . Type 2 diabetes mellitus with renal manifestations ([place]) 02/01/2014    Family History  Problem Relation Age of Onset  . Diabetes Father   . Cancer Sister   . Aneurysm Mother     Past Surgical History:  Procedure Laterality Date  . CATARACT EXTRACTION     cataract  . CORONARY ARTERY BYPASS GRAFT  2008  . EYE SURGERY    . FEMUR FRACTURE SURGERY    . FEMUR IM NAIL Left 07/05/2016   Procedure: INTRAMEDULLARY (IM) RETROGRADE FEMORAL NAILING LEFT;  Surgeon: Marybelle Killings, MD;  Location: [place];  Service: Orthopedics;  Laterality: Left;  . INSERTION OF DIALYSIS CATHETER Left 09/05/2013   Procedure: INSERTION OF DIALYSIS CATHETER;  Surgeon: Conrad Kobuk, MD;  Location: [facility];  Service: Vascular;  Laterality: Left;  . IR DIALY SHUNT INTRO [place] W/IMG LEFT Left 09/15/2016   Social History   Occupational History  . Not on file.   Social History Main Topics  . Smoking status: Never Smoker  . Smokeless tobacco: Never Used  . Alcohol use No  . Drug use: No  . Sexual activity: Not Currently    Birth control/ protection: Post-menopausal

## 2017-01-14 ENCOUNTER — Ambulatory Visit (INDEPENDENT_AMBULATORY_CARE_PROVIDER_SITE_OTHER): Payer: Medicare (Managed Care) | Admitting: Surgery

## 2017-01-14 ENCOUNTER — Encounter (INDEPENDENT_AMBULATORY_CARE_PROVIDER_SITE_OTHER): Payer: Self-pay | Admitting: Surgery

## 2017-01-14 ENCOUNTER — Telehealth (INDEPENDENT_AMBULATORY_CARE_PROVIDER_SITE_OTHER): Payer: Self-pay

## 2017-01-14 DIAGNOSIS — S72461A Displaced supracondylar fracture with intracondylar extension of lower end of right femur, initial encounter for closed fracture: Secondary | ICD-10-CM | POA: Diagnosis not present

## 2017-01-14 DIAGNOSIS — S92325A Nondisplaced fracture of second metatarsal bone, left foot, initial encounter for closed fracture: Secondary | ICD-10-CM | POA: Diagnosis not present

## 2017-01-14 NOTE — Progress Notes (Signed)
Office Visit Note   Patient: [name]           Date of Birth: 1931-01-28           MRN: [id number] Visit Date: 01/14/2017              Requested by: Jani Gravel, [address], [place] [address] PCP: Jani Gravel, MD   Assessment & Plan: Visit Diagnoses:  1. Nondisplaced fracture of second metatarsal bone, left foot, initial encounter for closed fracture   2. Displaced supracondylar fracture with intracondylar extension of lower end of right femur, initial encounter for closed fracture ([place])     Plan: I did give patient a prescription for the walker that she has wanted. Our clinical assistant's make contact with [facility] and left a message for them to call us back. I am wanting somewhat to go out and assess patient's home for safety. Advised patient that I'm concerned for her safety Patient has multiple medical issues that make her a fall risk. Does not have any family in the area. Follow-up with [place] in 6 recheck but will return sooner if needed.       Follow-Up Instructions: Return in about 6 months (around 07/15/2017).   Orders:  No orders of the defined types were placed in this encounter.  No orders of the defined types were placed in this encounter.     Procedures: No procedures performed   Clinical Data: No additional findings.   Subjective: Chief Complaint  Patient presents with  . Left Foot - Pain, Follow-up    HPI  Patient returns. States that she continues to do well. Right leg is doing better. Patient lives alone but states that she does have someone come in for a few hours during the day to help. She is alone in the evening. She states that she had been requesting a Rollator walker with a seat but her insurance provider [facility] has refused this per patient. Patient states that she also has not had any home health rehabilitation. I Discussed with her about possibly going to an assisted living facility but  she does not want that.  Review of Systems No current cardiac or pulmonary issues.  Objective: Vital Signs: There were no vitals taken for this visit.  Physical Exam  Constitutional: She is oriented to person, place, and time. No distress.  HENT:  Head: Normocephalic.  Eyes: Pupils are equal, round, and reactive to light. EOM are normal.  Abdominal: She exhibits no distension.  Musculoskeletal:  Negative logroll bilateral hips. Negative straight leg raise. Bilateral knee good range of motion. Bilateral calves nontender.  Neurological: She is alert and oriented to person, place, and time.  Skin: Skin is warm and dry.    Ortho Exam  Specialty Comments:  No specialty comments available.  Imaging: No results found.   PMFS History: Patient Active Problem List   Diagnosis Date Noted  . Displaced supracondylar fracture with intracondylar extension of lower end of right femur, initial encounter for closed fracture ([place]) 07/05/2016  . CAD (coronary artery disease), native coronary artery 07/05/2016  . Chest pain   . Closed fracture of left distal femur ([place])   . Closed nondisplaced fracture of fourth metatarsal bone of left foot   . Closed nondisplaced fracture of third metatarsal bone of left foot   . Multifocal atrial tachycardia (HCC)   . Nondisplaced fracture of second metatarsal bone, left foot, initial encounter  for closed fracture   . Closed displaced fracture of distal phalanx of left great toe   . ESRD on hemodialysis ([place])   . Chronic diastolic heart failure ([place]) 05/23/2014  . Type 2 diabetes mellitus with renal manifestations ([place]) 02/01/2014  . Hypertensive heart disease without CHF 11/10/2013  . Dementia 08/24/2013  . Frequent falls 08/24/2013  . Osteoarthritis 01/01/2013  . Hyperlipidemia 01/01/2013  . Hx of CABG 01/01/2013  . Anemia of chronic renal failure 10/05/2010   Past Medical History:  Diagnosis Date  . Absent pedal pulses   . Anemia of chronic  renal failure 10/05/2010   Nice response to Procrit   . Anemia, iron deficiency 08/18/2012  . Ataxia involving legs    has had several falls  . CAD (coronary artery disease), native coronary artery 07/05/2016   3VD by cath 2008  01/22/07 CABG with LIMA to LAD< SVG to dx, SVG to OM1-2, SVG to PDA Dr. Roxan Hockey  . Chronic diastolic heart failure ([place]) 05/23/2014  . Dementia    "mild" per MD note  . Diabetes mellitus   . ESRD on hemodialysis ([place])   . Family history of anesthesia complication    Nephew- had time waking  . Hyperlipidemia   . Hypertensive heart disease without CHF 11/10/2013  . Neuropathy   . Osteoarthritis 01/01/2013  . Type 2 diabetes mellitus with renal manifestations ([place]) 02/01/2014    Family History  Problem Relation Age of Onset  . Diabetes Father   . Cancer Sister   . Aneurysm Mother     Past Surgical History:  Procedure Laterality Date  . CATARACT EXTRACTION     cataract  . CORONARY ARTERY BYPASS GRAFT  2008  . EYE SURGERY    . FEMUR FRACTURE SURGERY    . FEMUR IM NAIL Left 07/05/2016   Procedure: INTRAMEDULLARY (IM) RETROGRADE FEMORAL NAILING LEFT;  Surgeon: Marybelle Killings, MD;  Location: [facility];  Service: Orthopedics;  Laterality: Left;  . INSERTION OF DIALYSIS CATHETER Left 09/05/2013   Procedure: INSERTION OF DIALYSIS CATHETER;  Surgeon: Conrad [place], MD;  Location: [facility];  Service: Vascular;  Laterality: Left;  . IR DIALY SHUNT INTRO [place] W/IMG LEFT Left 09/15/2016   Social History   Occupational History  . Not on file.   Social History Main Topics  . Smoking status: Never Smoker  . Smokeless tobacco: Never Used  . Alcohol use No  . Drug use: No  . Sexual activity: Not Currently    Birth control/ protection: Post-menopausal

## 2017-01-14 NOTE — Telephone Encounter (Signed)
Called [facility] and they transferred me to [place]. LMOM to return call.  Per Benjiman Core he would like for pt to have  1. HHPT 2. Rolling walker with seat( script made) 3. Someone to come out to her home and access safety in home   [place] [phone] FAX [phone]

## 2017-04-06 ENCOUNTER — Other Ambulatory Visit (HOSPITAL_COMMUNITY): Payer: Self-pay | Admitting: Family Medicine

## 2017-04-06 DIAGNOSIS — Z992 Dependence on renal dialysis: Principal | ICD-10-CM

## 2017-04-06 DIAGNOSIS — N186 End stage renal disease: Secondary | ICD-10-CM

## 2017-04-08 ENCOUNTER — Ambulatory Visit (HOSPITAL_COMMUNITY)
Admission: RE | Admit: 2017-04-08 | Discharge: 2017-04-08 | Disposition: A | Discharge status: Home / Self Care | Payer: Medicare (Managed Care) | Source: Ambulatory Visit | Attending: Family Medicine | Admitting: Family Medicine

## 2017-04-08 ENCOUNTER — Encounter (HOSPITAL_COMMUNITY): Payer: Self-pay | Admitting: Interventional Radiology

## 2017-04-08 DIAGNOSIS — Z992 Dependence on renal dialysis: Secondary | ICD-10-CM | POA: Diagnosis not present

## 2017-04-08 DIAGNOSIS — N186 End stage renal disease: Secondary | ICD-10-CM

## 2017-04-08 DIAGNOSIS — T82898A Other specified complication of vascular prosthetic devices, implants and grafts, initial encounter: Secondary | ICD-10-CM | POA: Insufficient documentation

## 2017-04-08 DIAGNOSIS — Y832 Surgical operation with anastomosis, bypass or graft as the cause of abnormal reaction of the patient, or of later complication, without mention of misadventure at the time of the procedure: Secondary | ICD-10-CM | POA: Insufficient documentation

## 2017-04-08 HISTORY — PX: IR DIALY SHUNT INTRO NEEDLE/INTRACATH INITIAL W/IMG LEFT: IMG6102

## 2017-04-08 MED ORDER — IOPAMIDOL (ISOVUE-300) INJECTION 61%
INTRAVENOUS | Status: AC
  Administered 2017-04-08: 25 mL
  Filled 2017-04-08: qty 100

## 2017-04-28 NOTE — Progress Notes (Signed)
mailed

## 2017-06-18 IMAGING — DX DG CHEST 2V
2 series · 2 of 2 positions shown · non-contrast
Comparison: 07/08/2016 chest radiograph

CLINICAL DATA: 85 y/o  F; shortness of breath.

EXAM:
CHEST  2 VIEW

[chest lat]
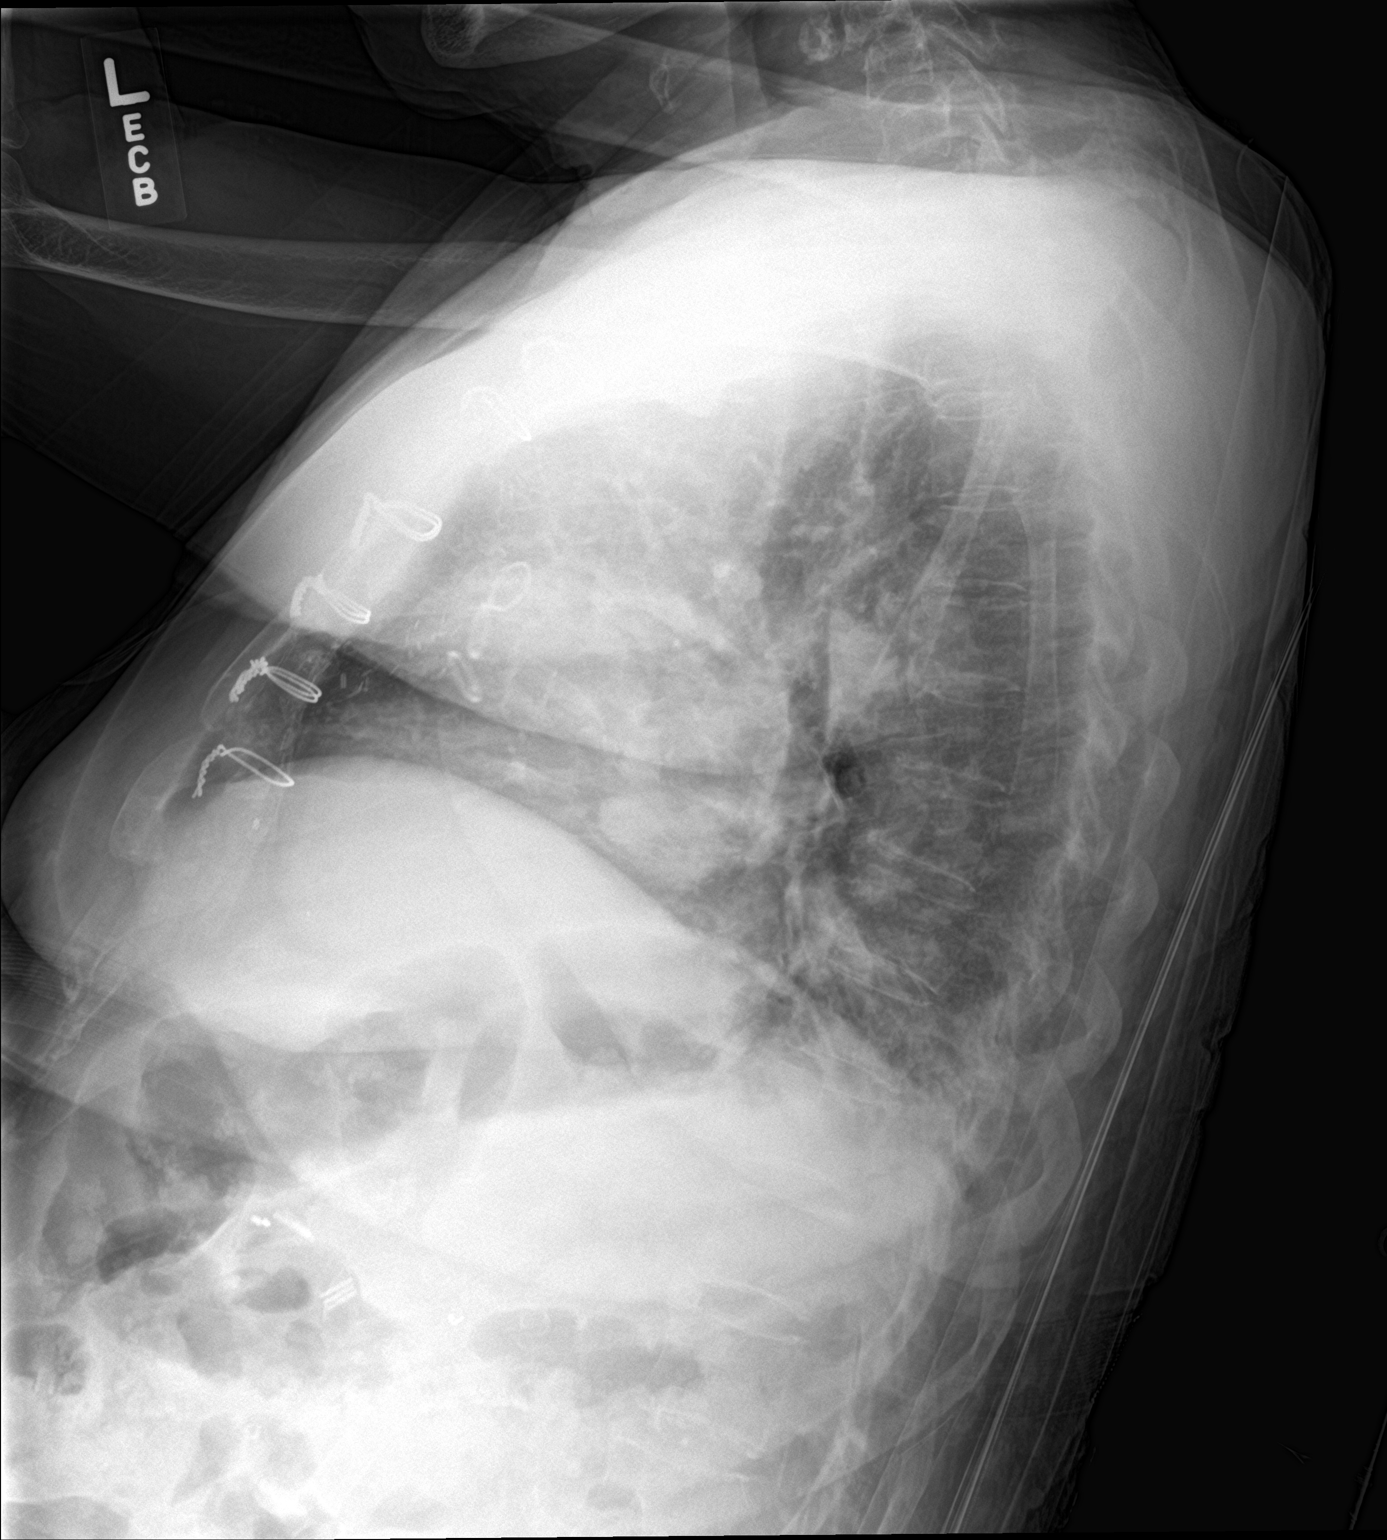

[chest ap]
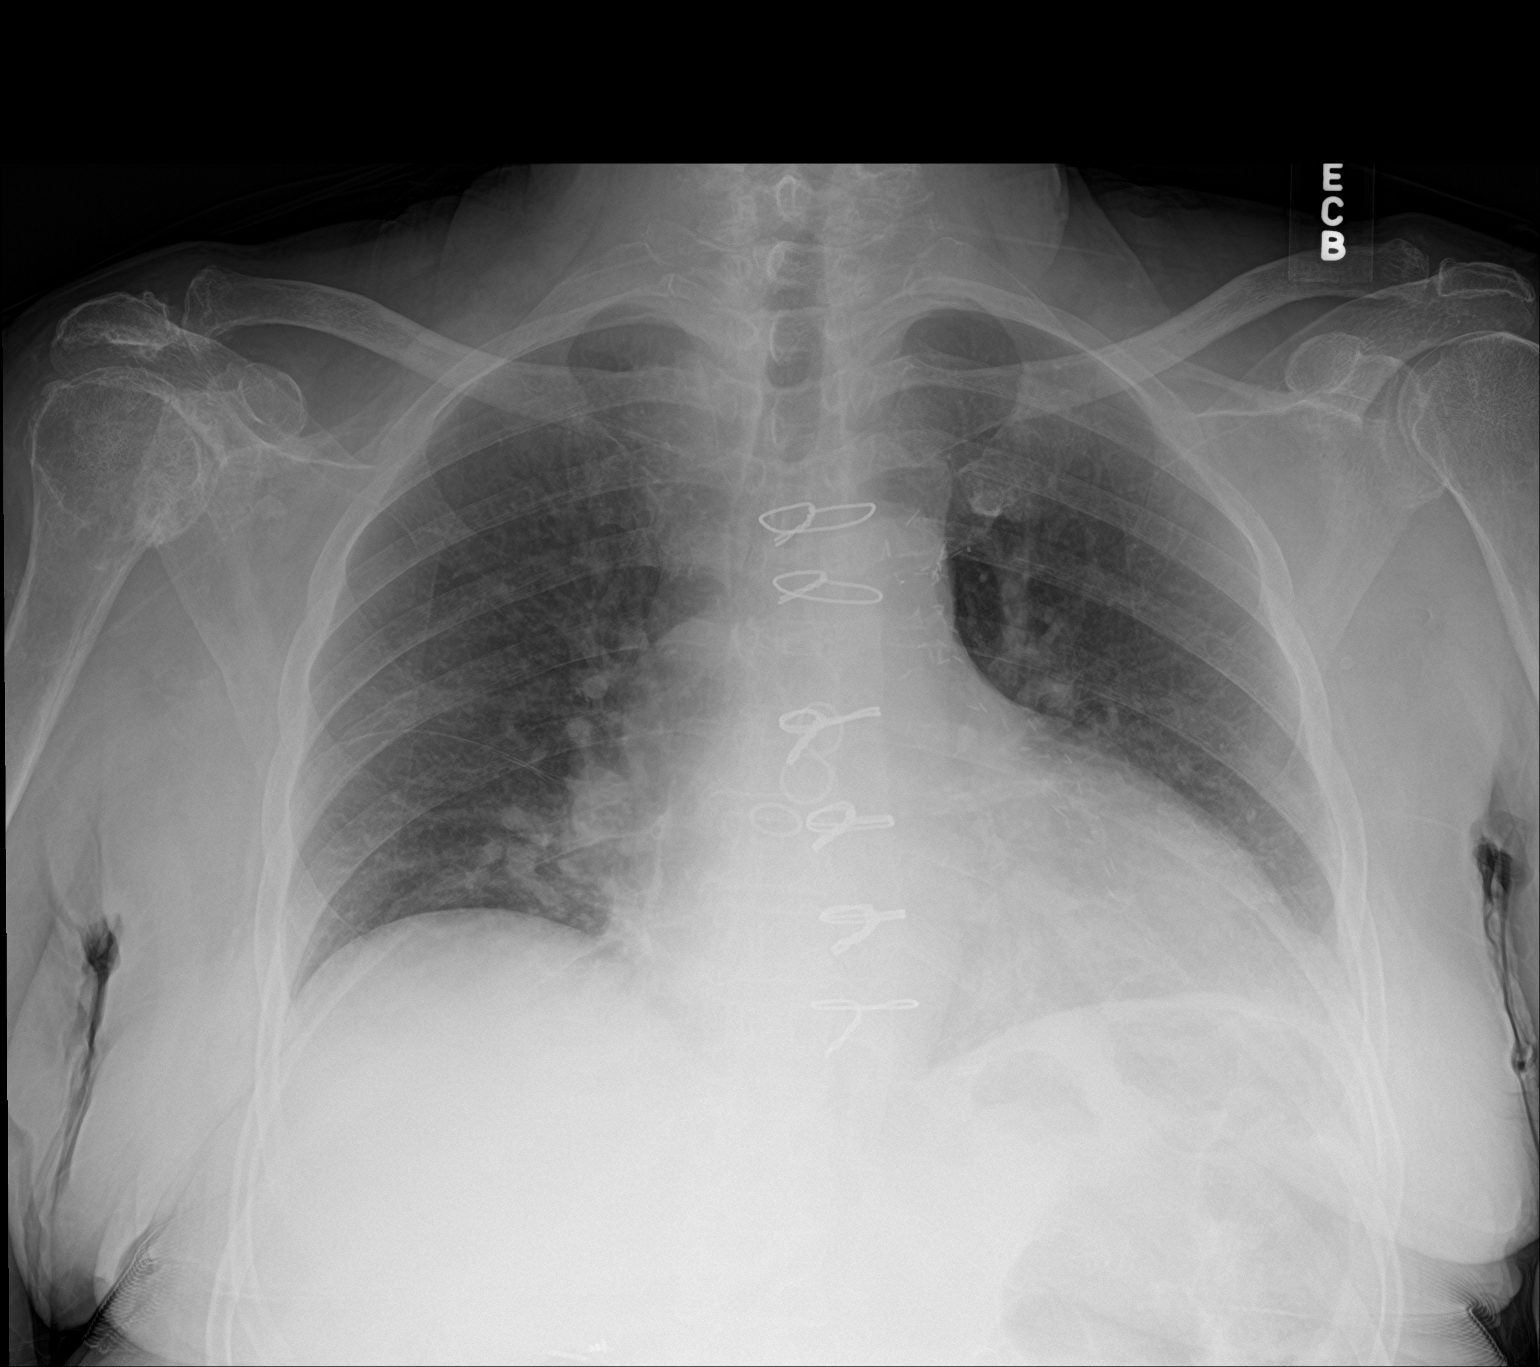

[2 of 2 positions shown; findings below may reference images not displayed]

FINDINGS: Stable mild cardiomegaly given projection and technique. Pulmonary
vascular congestion. No focal consolidation. No pleural effusion or
pneumothorax. Stable postsurgical changes related to CABG. Right
upper quadrant cholecystectomy clips. Degenerative changes of the
right shoulder joint.
IMPRESSION: Stable cardiomegaly.  Pulmonary vascular congestion.

By: Adilio Alice M.D.

## 2017-07-02 ENCOUNTER — Other Ambulatory Visit (HOSPITAL_COMMUNITY): Payer: Self-pay | Admitting: Family Medicine

## 2017-07-02 DIAGNOSIS — Z992 Dependence on renal dialysis: Principal | ICD-10-CM

## 2017-07-02 DIAGNOSIS — N186 End stage renal disease: Secondary | ICD-10-CM

## 2017-07-06 ENCOUNTER — Ambulatory Visit (HOSPITAL_COMMUNITY)
Admission: RE | Admit: 2017-07-06 | Discharge: 2017-07-06 | Disposition: A | Discharge status: Home / Self Care | Payer: Medicare (Managed Care) | Source: Ambulatory Visit | Attending: Family Medicine | Admitting: Family Medicine

## 2017-07-06 ENCOUNTER — Encounter (HOSPITAL_COMMUNITY): Payer: Self-pay

## 2017-07-15 ENCOUNTER — Ambulatory Visit (INDEPENDENT_AMBULATORY_CARE_PROVIDER_SITE_OTHER): Payer: Medicare (Managed Care) | Admitting: Surgery

## 2017-07-15 ENCOUNTER — Telehealth (INDEPENDENT_AMBULATORY_CARE_PROVIDER_SITE_OTHER): Payer: Self-pay

## 2017-07-15 ENCOUNTER — Encounter (INDEPENDENT_AMBULATORY_CARE_PROVIDER_SITE_OTHER): Payer: Self-pay

## 2017-07-15 NOTE — Telephone Encounter (Signed)
Called patient to check on her, since she missed her appt with Benjiman Core, PA today.  She said she had no transportation today and [facility] was closed.  She rescheduled for 07/29/17 at 10:45 with [name] (for recheck of her left leg).

## 2017-07-22 ENCOUNTER — Ambulatory Visit
Admission: RE | Admit: 2017-07-22 | Discharge: 2017-07-22 | Disposition: A | Discharge status: Home / Self Care | Payer: No Typology Code available for payment source | Source: Ambulatory Visit | Attending: Family Medicine | Admitting: Family Medicine

## 2017-07-22 ENCOUNTER — Other Ambulatory Visit: Payer: Self-pay | Admitting: Family Medicine

## 2017-07-22 DIAGNOSIS — I5030 Unspecified diastolic (congestive) heart failure: Secondary | ICD-10-CM

## 2017-07-22 DIAGNOSIS — I2510 Atherosclerotic heart disease of native coronary artery without angina pectoris: Secondary | ICD-10-CM

## 2017-07-22 DIAGNOSIS — E7800 Pure hypercholesterolemia, unspecified: Secondary | ICD-10-CM

## 2017-07-29 ENCOUNTER — Ambulatory Visit (INDEPENDENT_AMBULATORY_CARE_PROVIDER_SITE_OTHER): Payer: Medicare (Managed Care) | Admitting: Surgery

## 2017-08-04 ENCOUNTER — Ambulatory Visit: Payer: Self-pay | Admitting: Physician Assistant

## 2017-08-05 ENCOUNTER — Ambulatory Visit: Payer: Self-pay | Admitting: Physician Assistant

## 2018-03-08 ENCOUNTER — Other Ambulatory Visit: Payer: Self-pay | Admitting: Physician Assistant

## 2018-03-08 ENCOUNTER — Other Ambulatory Visit (HOSPITAL_COMMUNITY): Payer: Self-pay | Admitting: Gerontology

## 2018-03-08 DIAGNOSIS — Z992 Dependence on renal dialysis: Principal | ICD-10-CM

## 2018-03-08 DIAGNOSIS — N186 End stage renal disease: Secondary | ICD-10-CM

## 2018-03-09 ENCOUNTER — Ambulatory Visit (HOSPITAL_COMMUNITY)
Admission: RE | Admit: 2018-03-09 | Discharge: 2018-03-09 | Disposition: A | Discharge status: Home / Self Care | Payer: Medicare (Managed Care) | Source: Ambulatory Visit | Attending: Gerontology | Admitting: Gerontology

## 2018-03-09 ENCOUNTER — Other Ambulatory Visit: Payer: Self-pay

## 2018-03-09 ENCOUNTER — Other Ambulatory Visit (HOSPITAL_COMMUNITY): Payer: Self-pay | Admitting: Gerontology

## 2018-03-09 ENCOUNTER — Encounter (HOSPITAL_COMMUNITY): Payer: Self-pay

## 2018-03-09 DIAGNOSIS — T82898A Other specified complication of vascular prosthetic devices, implants and grafts, initial encounter: Secondary | ICD-10-CM | POA: Diagnosis present

## 2018-03-09 DIAGNOSIS — E785 Hyperlipidemia, unspecified: Secondary | ICD-10-CM | POA: Diagnosis not present

## 2018-03-09 DIAGNOSIS — E1140 Type 2 diabetes mellitus with diabetic neuropathy, unspecified: Secondary | ICD-10-CM | POA: Diagnosis not present

## 2018-03-09 DIAGNOSIS — F0390 Unspecified dementia without behavioral disturbance: Secondary | ICD-10-CM | POA: Diagnosis not present

## 2018-03-09 DIAGNOSIS — I5032 Chronic diastolic (congestive) heart failure: Secondary | ICD-10-CM | POA: Diagnosis not present

## 2018-03-09 DIAGNOSIS — M1990 Unspecified osteoarthritis, unspecified site: Secondary | ICD-10-CM | POA: Diagnosis not present

## 2018-03-09 DIAGNOSIS — N186 End stage renal disease: Secondary | ICD-10-CM | POA: Insufficient documentation

## 2018-03-09 DIAGNOSIS — Z992 Dependence on renal dialysis: Secondary | ICD-10-CM

## 2018-03-09 DIAGNOSIS — E1122 Type 2 diabetes mellitus with diabetic chronic kidney disease: Secondary | ICD-10-CM | POA: Diagnosis not present

## 2018-03-09 DIAGNOSIS — Z951 Presence of aortocoronary bypass graft: Secondary | ICD-10-CM | POA: Insufficient documentation

## 2018-03-09 DIAGNOSIS — Y832 Surgical operation with anastomosis, bypass or graft as the cause of abnormal reaction of the patient, or of later complication, without mention of misadventure at the time of the procedure: Secondary | ICD-10-CM | POA: Diagnosis not present

## 2018-03-09 DIAGNOSIS — I132 Hypertensive heart and chronic kidney disease with heart failure and with stage 5 chronic kidney disease, or end stage renal disease: Secondary | ICD-10-CM | POA: Insufficient documentation

## 2018-03-09 DIAGNOSIS — Z794 Long term (current) use of insulin: Secondary | ICD-10-CM | POA: Diagnosis not present

## 2018-03-09 DIAGNOSIS — Z7901 Long term (current) use of anticoagulants: Secondary | ICD-10-CM | POA: Insufficient documentation

## 2018-03-09 DIAGNOSIS — I2510 Atherosclerotic heart disease of native coronary artery without angina pectoris: Secondary | ICD-10-CM | POA: Diagnosis not present

## 2018-03-09 HISTORY — PX: IR US GUIDE VASC ACCESS LEFT: IMG2389

## 2018-03-09 HISTORY — PX: IR DIALY SHUNT INTRO NEEDLE/INTRACATH INITIAL W/IMG LEFT: IMG6102

## 2018-03-09 LAB — CBC
HCT: 34.4 % — ABNORMAL LOW (ref 36.0–46.0)
Hemoglobin: 10.7 g/dL — ABNORMAL LOW (ref 12.0–15.0)
MCH: 28.8 pg (ref 26.0–34.0)
MCHC: 31.1 g/dL (ref 30.0–36.0)
MCV: 92.5 fL (ref 80.0–100.0)
Platelets: 159 10*3/uL (ref 150–400)
RBC: 3.72 MIL/uL — ABNORMAL LOW (ref 3.87–5.11)
RDW: 15.3 % (ref 11.5–15.5)
WBC: 7.2 10*3/uL (ref 4.0–10.5)
nRBC: 0 % (ref 0.0–0.2)

## 2018-03-09 LAB — BASIC METABOLIC PANEL
Anion gap: 21 — ABNORMAL HIGH (ref 5–15)
BUN: 83 mg/dL — ABNORMAL HIGH (ref 8–23)
CO2: 24 mmol/L (ref 22–32)
CREATININE: 11.24 mg/dL — AB (ref 0.44–1.00)
Calcium: 9.1 mg/dL (ref 8.9–10.3)
Chloride: 91 mmol/L — ABNORMAL LOW (ref 98–111)
GFR calc Af Amer: 3 mL/min — ABNORMAL LOW (ref >60)
GFR calc non Af Amer: 3 mL/min — ABNORMAL LOW (ref >60)
Glucose, Bld: 152 mg/dL — ABNORMAL HIGH (ref 70–99)
Potassium: 4.3 mmol/L (ref 3.5–5.1)
Sodium: 136 mmol/L (ref 135–145)

## 2018-03-09 LAB — PROTIME-INR
INR: 1
Prothrombin Time: 13.1 seconds (ref 11.4–15.2)

## 2018-03-09 LAB — APTT: aPTT: 31 seconds (ref 24–36)

## 2018-03-09 LAB — GLUCOSE, CAPILLARY: Glucose-Capillary: 142 mg/dL — ABNORMAL HIGH (ref 70–99)

## 2018-03-09 MED ORDER — HYDROCODONE-ACETAMINOPHEN 5-325 MG PO TABS: 1.0000 | ORAL_TABLET | ORAL | Status: DC | PRN

## 2018-03-09 MED ORDER — HEPARIN SODIUM (PORCINE) 1000 UNIT/ML IJ SOLN
INTRAMUSCULAR | Status: AC
  Filled 2018-03-09: qty 1

## 2018-03-09 MED ORDER — MIDAZOLAM HCL 2 MG/2ML IJ SOLN
INTRAMUSCULAR | Status: AC | PRN
  Administered 2018-03-09: 0.5 mg via INTRAVENOUS

## 2018-03-09 MED ORDER — SODIUM CHLORIDE 0.9 % IV SOLN: INTRAVENOUS | Status: DC

## 2018-03-09 MED ORDER — LIDOCAINE HCL (PF) 1 % IJ SOLN
INTRAMUSCULAR | Status: AC | PRN
  Administered 2018-03-09: 5 mL

## 2018-03-09 MED ORDER — FENTANYL CITRATE (PF) 100 MCG/2ML IJ SOLN
INTRAMUSCULAR | Status: AC
  Filled 2018-03-09: qty 2

## 2018-03-09 MED ORDER — ALTEPLASE 2 MG IJ SOLR
INTRAMUSCULAR | Status: AC
  Filled 2018-03-09: qty 2

## 2018-03-09 MED ORDER — MIDAZOLAM HCL 2 MG/2ML IJ SOLN
INTRAMUSCULAR | Status: AC
  Filled 2018-03-09: qty 2

## 2018-03-09 MED ORDER — LIDOCAINE HCL 1 % IJ SOLN
INTRAMUSCULAR | Status: AC
  Filled 2018-03-09: qty 20

## 2018-03-09 MED ORDER — FENTANYL CITRATE (PF) 100 MCG/2ML IJ SOLN
INTRAMUSCULAR | Status: AC | PRN
  Administered 2018-03-09: 25 ug via INTRAVENOUS

## 2018-03-09 MED ORDER — IOPAMIDOL (ISOVUE-300) INJECTION 61%
INTRAVENOUS | Status: AC
  Administered 2018-03-09: 15 mL
  Filled 2018-03-09: qty 100

## 2018-03-09 NOTE — Discharge Instructions (Addendum)
Fistulogram, Care After °Refer to this sheet in the next few weeks. These instructions provide you with information on caring for yourself after your procedure. Your health care provider may also give you more specific instructions. Your treatment has been planned according to current medical practices, but problems sometimes occur. Call your health care provider if you have any problems or questions after your procedure. °What can I expect after the procedure? °After your procedure, it is typical to have the following: °· A small amount of discomfort in the area where the catheters were placed. °· A small amount of bruising around the fistula. °· Sleepiness and fatigue. ° °Follow these instructions at home: °· Rest at home for the day following your procedure. °· Do not drive or operate heavy machinery while taking pain medicine. °· Take medicines only as directed by your health care provider. °· Do not take baths, swim, or use a hot tub until your health care provider approves. You may shower 24 hours after the procedure or as directed by your health care provider. °· There are many different ways to close and cover an incision, including stitches, skin glue, and adhesive strips. Follow your health care provider's instructions on: °? Incision care. °? Bandage (dressing) changes and removal. °? Incision closure removal. °· Monitor your dialysis fistula carefully. °Contact a health care provider if: °· You have drainage, redness, swelling, or pain at your catheter site. °· You have a fever. °· You have chills. °Get help right away if: °· You feel weak. °· You have trouble balancing. °· You have trouble moving your arms or legs. °· You have problems with your speech or vision. °· You can no longer feel a vibration or buzz when you put your fingers over your dialysis fistula. °· The limb that was used for the procedure: °? Swells. °? Is painful. °? Is cold. °? Is discolored, such as blue or pale white. °This  information is not intended to replace advice given to you by your health care provider. Make sure you discuss any questions you have with your health care provider. °Document Released: 08/01/2013 Document Revised: 08/23/2015 Document Reviewed: 05/06/2013 °Elsevier Interactive Patient Education © 2018 Elsevier Inc. ° ° °Moderate Conscious Sedation, Adult, Care After °These instructions provide you with information about caring for yourself after your procedure. Your health care provider may also give you more specific instructions. Your treatment has been planned according to current medical practices, but problems sometimes occur. Call your health care provider if you have any problems or questions after your procedure. °What can I expect after the procedure? °After your procedure, it is common: °· To feel sleepy for several hours. °· To feel clumsy and have poor balance for several hours. °· To have poor judgment for several hours. °· To vomit if you eat too soon. ° °Follow these instructions at home: °For at least 24 hours after the procedure: ° °· Do not: °? Participate in activities where you could fall or become injured. °? Drive. °? Use heavy machinery. °? Drink alcohol. °? Take sleeping pills or medicines that cause drowsiness. °? Make important decisions or sign legal documents. °? Take care of children on your own. °· Rest. °Eating and drinking °· Follow the diet recommended by your health care provider. °· If you vomit: °? Drink water, juice, or soup when you can drink without vomiting. °? Make sure you have little or no nausea before eating solid foods. °General instructions °· Have a responsible adult stay   with you until you are awake and alert. °· Take over-the-counter and prescription medicines only as told by your health care provider. °· If you smoke, do not smoke without supervision. °· Keep all follow-up visits as told by your health care provider. This is important. °Contact a health care  provider if: °· You keep feeling nauseous or you keep vomiting. °· You feel light-headed. °· You develop a rash. °· You have a fever. °Get help right away if: °· You have trouble breathing. °This information is not intended to replace advice given to you by your health care provider. Make sure you discuss any questions you have with your health care provider. °Document Released: 01/05/2013 Document Revised: 08/20/2015 Document Reviewed: 07/07/2015 °Elsevier Interactive Patient Education © 2018 Elsevier Inc. ° °

## 2018-03-09 NOTE — Procedures (Signed)
  Procedure: Shuntogram   EBL:   minimal Complications:  none immediate  See full dictation in [facility].  Dillard Cannon MD Main # [phone] Pager  [phone]

## 2018-03-09 NOTE — Progress Notes (Signed)
Radiology called and informed pt will need to [facility] to transport her home.

## 2018-03-09 NOTE — H&P (Signed)
Chief Complaint: Patient was seen in consultation today for AVF declot  Referring Physician(s): Fernald,Carrie  Supervising Physician: Arne Cleveland  Patient Status: [facility] - Out-pt  History of Present Illness: [name] is a 84 y.o. female with a past medical history significant for anemia, CAD, CHF, ESRD on HD T/Th/S, HLD, HTN and DM II who presents for AVF declot today. There are no recent notes available for review. Patient is somewhat of a poor historian stating she does not know why she is here, just that she was told to come and [facility] brought [facility] here. She states that they tried to do dialysis on Saturday however they were not successful - she does not know why it was unsuccessful. She denies any previous complications with her fistula or dialysis, she has had left upper extremity AVF for several years. She denies any other complaints. She reports that she lives alone, does not have family or friends who help her out but she does have a [employer] system. She does not drive and requires transportation to all of her appointments. She does not know what medications she takes because they "come in a pack and I just take them." After explanation of procedure today she states understanding and agrees to proceed.   Past Medical History:  Diagnosis Date  . Absent pedal pulses   . Anemia of chronic renal failure 10/05/2010   Nice response to Procrit   . Anemia, iron deficiency 08/18/2012  . Ataxia involving legs    has had several falls  . CAD (coronary artery disease), native coronary artery 07/05/2016   3VD by cath 2008  01/22/07 CABG with LIMA to LAD< SVG to dx, SVG to OM1-2, SVG to PDA Dr. Roxan Hockey  . Chronic diastolic heart failure ([place]) 05/23/2014  . Dementia ([place])    "mild" per MD note  . Diabetes mellitus   . ESRD on hemodialysis ([place])   . Family history of anesthesia complication    Nephew- had time waking  . Hyperlipidemia   . Hypertensive heart disease without CHF  11/10/2013  . Neuropathy   . Osteoarthritis 01/01/2013  . Type 2 diabetes mellitus with renal manifestations ([place]) 02/01/2014    Past Surgical History:  Procedure Laterality Date  . CATARACT EXTRACTION     cataract  . CORONARY ARTERY BYPASS GRAFT  2008  . EYE SURGERY    . FEMUR FRACTURE SURGERY    . FEMUR IM NAIL Left 07/05/2016   Procedure: INTRAMEDULLARY (IM) RETROGRADE FEMORAL NAILING LEFT;  Surgeon: Marybelle Killings, MD;  Location: [place];  Service: Orthopedics;  Laterality: Left;  . INSERTION OF DIALYSIS CATHETER Left 09/05/2013   Procedure: INSERTION OF DIALYSIS CATHETER;  Surgeon: Conrad Bertsch-Oceanview, MD;  Location: [place];  Service: Vascular;  Laterality: Left;  . IR DIALY SHUNT INTRO [place] W/IMG LEFT Left 09/15/2016  . IR DIALY SHUNT INTRO NEEDLE/INTRACATH INITIAL W/IMG LEFT Left 04/08/2017    Allergies: Patient has no known allergies.  Medications: Prior to Admission medications   Medication Sig Start Date End Date Taking? Authorizing Provider  acetaminophen-codeine (TYLENOL #3) 300-30 MG tablet Take 1 tablet by mouth every 6 (six) hours as needed for moderate pain. 07/09/16  Yes Rai, Ripudeep K, MD  amiodarone (PACERONE) 200 MG tablet Take 1 tablet (200 mg total) by mouth daily. 07/09/16  Yes Rai, Ripudeep K, MD  calcitRIOL (ROCALTROL) 0.5 MCG capsule Take 1 capsule (0.5 mcg total) by mouth Every Tuesday,Thursday,and Saturday with dialysis. 07/10/16  Yes Rai,  Ripudeep K, MD  carvedilol (COREG) 6.25 MG tablet Take 1 tablet (6.25 mg total) by mouth 2 (two) times daily with a meal. 07/09/16  Yes Rai, Ripudeep K, MD  donepezil (ARICEPT) 10 MG tablet Take 5 mg by mouth at bedtime. For MEMORY   Yes [provider]  erythromycin ophthalmic ointment Place 1 application into both eyes at bedtime. For keratitis   Yes [provider]  ferrous sulfate 325 (65 FE) MG tablet Take 325 mg by mouth 2 (two) times daily. As SUPPLEMENT   Yes [provider]  folic  acid-vitamin b complex-vitamin c-selenium-zinc (DIALYVITE) 3 MG TABS tablet Take 1 tablet by mouth daily.   Yes [provider]  gabapentin (NEURONTIN) 300 MG capsule Take 300 mg by mouth at bedtime. For NEUROPATHY/NERVE PAIN   Yes [provider]  insulin glargine (LANTUS) 100 UNIT/ML injection Inject 0.05 mLs (5 Units total) into the skin at bedtime. 07/09/16  Yes Rai, Ripudeep K, MD  loperamide (IMODIUM A-D) 2 MG tablet Take 2 mg by mouth 4 (four) times daily as needed for diarrhea or loose stools.   Yes [provider]  multivitamin (RENA-VIT) TABS tablet Take 1 tablet by mouth at bedtime. 07/09/16  Yes Rai, Ripudeep K, MD  Nutritional Supplements (FEEDING SUPPLEMENT, NEPRO CARB STEADY,) LIQD Take 237 mLs by mouth 2 (two) times daily between meals. 07/09/16  Yes Rai, Ripudeep K, MD  Propylene Glycol (SYSTANE BALANCE) 0.6 % SOLN Apply 1 drop to eye 2 (two) times daily.   Yes [provider]  rOPINIRole (REQUIP) 1 MG tablet Take 1 mg by mouth 2 (two) times daily.   Yes [provider]  rosuvastatin (CRESTOR) 5 MG tablet Take 5 mg by mouth at bedtime. For CHOLESTEROL   Yes [provider]  sevelamer carbonate (RENVELA) 800 MG tablet Take 1 tablet (800 mg total) by mouth 3 (three) times daily with meals. 08/27/13  Yes Kathie Dike, MD  enoxaparin (LOVENOX) 80 MG/0.8ML injection Inject 0.7 mLs (70 mg total) into the skin daily. Please continue until INR above 2 then stop 07/10/16   Rai, Ripudeep K, MD  insulin aspart (NOVOLOG) 100 UNIT/ML injection Inject 0-9 Units into the skin 3 (three) times daily with meals. Sliding scale CBG 70 - 120: 0 units CBG 121 - 150: 1 unit,  CBG 151 - 200: 2 units,  CBG 201 - 250: 3 units,  CBG 251 - 300: 5 units,  CBG 301 - 350: 7 units,  CBG 351 - 400: 9 units   CBG > 400: 9 units and notify your MD 07/09/16   Rai, Vernelle Emerald, MD  ketoconazole (NIZORAL) 2 % cream Apply 1 application topically daily.    [provider]  lidocaine-prilocaine (EMLA) cream Apply 1 application topically as needed (On Tues, Thu,and Sat.).    [provider]  menthol-cetylpyridinium (CEPACOL) 3 MG lozenge Take 1 lozenge by mouth as needed for sore throat.    [provider]  ondansetron (ZOFRAN) 4 MG tablet Take 4 mg by mouth every 8 (eight) hours as needed for nausea or vomiting.    [provider]  warfarin (COUMADIN) 4 MG tablet Take 1 tablet (4 mg total) by mouth daily at 6 PM. Please adjust dose according to PT/INR, continue for 3 months 07/10/16   Rai, Vernelle Emerald, MD     Family History  Problem Relation Age of Onset  . Diabetes Father   . Cancer Sister   . Aneurysm Mother  Social History   Socioeconomic History  . Marital status: Single    Spouse name: Not on file  . Number of children: Not on file  . Years of education: Not on file  . Highest education level: Not on file  Occupational History  . Not on file  Social Needs  . Financial resource strain: Not on file  . Food insecurity:    Worry: Not on file    Inability: Not on file  . Transportation needs:    Medical: Not on file    Non-medical: Not on file  Tobacco Use  . Smoking status: Never Smoker  . Smokeless tobacco: Never Used  Substance and Sexual Activity  . Alcohol use: No  . Drug use: No  . Sexual activity: Not Currently    Birth control/protection: Post-menopausal  Lifestyle  . Physical activity:    Days per week: Not on file    Minutes per session: Not on file  . Stress: Not on file  Relationships  . Social connections:    Talks on phone: Not on file    Gets together: Not on file    Attends religious service: Not on file    Active member of club or organization: Not on file    Attends meetings of clubs or organizations: Not on file    Relationship status: Not on file  Other Topics Concern  . Not on file  Social History Narrative  . Not on file     Review of Systems: A 12 point ROS discussed and  pertinent positives are indicated in the HPI above.  All other systems are negative.  Review of Systems  Reason unable to perform ROS: Limited ROS - patient poor historian, answers "I don't know" to most questions.  Constitutional: Negative for chills and fever.  Respiratory: Negative for cough and shortness of breath.   Cardiovascular: Negative for chest pain.  Gastrointestinal: Negative for abdominal pain and vomiting.  Neurological: Positive for dizziness (sometimes at dialysis).    Vital Signs: BP (!) 151/78   Pulse 77   Temp 97.8 F (36.6 C)   Ht 5\' 6"  (1.676 m)   Wt 170 lb (77.1 kg)   SpO2 91%   BMI 27.44 kg/m   Physical Exam  Constitutional: She is oriented to person, place, and time. No distress.  HENT:  Head: Normocephalic.  Cardiovascular: Normal rate, regular rhythm and normal heart sounds.  LUE AVF (+) thrill, (+) bruit. No erythema, edema, open wounds, skin changes, pain on palpation.  Pulmonary/Chest: Effort normal and breath sounds normal.  Abdominal: Soft. She exhibits no distension. There is no tenderness.  Neurological: She is alert and oriented to person, place, and time.  Skin: Skin is warm and dry. She is not diaphoretic.  Psychiatric: She has a normal mood and affect. Her behavior is normal. Judgment and thought content normal.  Vitals reviewed.    MD Evaluation Airway: WNL Heart: WNL Abdomen: WNL Chest/ Lungs: WNL ASA  Classification: 3 Mallampati/Airway Score: Two   Imaging: No results found.  Labs:  CBC: Recent Labs    03/09/18 1300  WBC 7.2  HGB 10.7*  HCT 34.4*  PLT 159    COAGS: Recent Labs    03/09/18 1300  INR 1.00  APTT 31    BMP: Recent Labs    03/09/18 1300  NA 136  K 4.3  CL 91*  CO2 24  GLUCOSE 152*  BUN 83*  CALCIUM 9.1  CREATININE 11.24*  GFRNONAA 3*  GFRAA 3*    LIVER FUNCTION TESTS: No results for input(s): BILITOT, AST, ALT, ALKPHOS, PROT, ALBUMIN in the last 8760 hours.  TUMOR MARKERS: No  results for input(s): AFPTM, CEA, CA199, CHROMGRNA in the last 8760 hours.  Assessment and Plan:  Patient with self reported difficulty accessing left upper extremity AVF during dialysis Saturday 12/7 -- she is somewhat of a poor historian regarding her medical care, however she does deny any previous issues with AVF and has not had a shuntogram previously to her knowledge. She agrees to proceed with procedure after explanation today.   She has been NPO since last night, she does not take blood thinning medications. WBC 7.2, hgb 10.7, INR 1.00, creatinine 11.24.  Risks and benefits discussed with the patient including, but not limited to bleeding, infection, vascular injury, pulmonary embolism, need for tunneled HD catheter placement or even death.  All of the patient's questions were answered, patient is agreeable to proceed.  Consent signed and in chart.  Thank you for this interesting consult.  I greatly enjoyed meeting [name] and look forward to participating in their care.  A copy of this report was sent to the requesting provider on this date.  Electronically Signed: Joaquim Nam, PA-C 03/09/2018, 3:08 PM   I spent a total of  40 Minutes   in face to face in clinical consultation, greater than 50% of which was counseling/coordinating care for fistulogram with possible intervention.

## 2018-04-12 ENCOUNTER — Other Ambulatory Visit: Payer: Self-pay

## 2018-04-12 DIAGNOSIS — T82399A Other mechanical complication of unspecified vascular grafts, initial encounter: Secondary | ICD-10-CM

## 2018-04-15 ENCOUNTER — Encounter: Payer: Self-pay | Admitting: Family

## 2018-04-15 ENCOUNTER — Ambulatory Visit (HOSPITAL_COMMUNITY)
Admission: RE | Admit: 2018-04-15 | Discharge: 2018-04-15 | Disposition: A | Discharge status: Home / Self Care | Payer: Medicare (Managed Care) | Source: Ambulatory Visit | Attending: Family | Admitting: Family

## 2018-04-15 ENCOUNTER — Other Ambulatory Visit: Payer: Self-pay

## 2018-04-15 ENCOUNTER — Ambulatory Visit (INDEPENDENT_AMBULATORY_CARE_PROVIDER_SITE_OTHER): Payer: Medicare (Managed Care) | Admitting: Family

## 2018-04-15 VITALS — BP 116/59 | HR 75 | Temp 98.7°F | Resp 20 | Ht 66.0 in | Wt 170.0 lb

## 2018-04-15 DIAGNOSIS — T82399A Other mechanical complication of unspecified vascular grafts, initial encounter: Secondary | ICD-10-CM

## 2018-04-15 DIAGNOSIS — N186 End stage renal disease: Secondary | ICD-10-CM | POA: Diagnosis not present

## 2018-04-15 DIAGNOSIS — T82590A Other mechanical complication of surgically created arteriovenous fistula, initial encounter: Secondary | ICD-10-CM

## 2018-04-15 DIAGNOSIS — Z992 Dependence on renal dialysis: Secondary | ICD-10-CM | POA: Diagnosis not present

## 2018-04-15 NOTE — Progress Notes (Signed)
CC: difficulty cannulating left upper arm AVF a month ago, not in the last 2 weeks  History of Present Illness  [name] is a 85 y.o. (1930/07/11) female who is s/p Left internal jugular vein tunneled dialysis catheter placement on 09-05-13 by Dr. Bridgett Larsson. She has not been seen by our practice since then.  She returns today at the request of Dr. Marval Regal re difficulty cannulating left upper arm AV fistula about a month ago, but no problem cannulating in the last 2 weeks per pt.  She dialyzes T-TH-S via left upper arm AVF. She reports pain, stiffness, and loss of dexterity in left hand since the creation of the left upper arm AVF. She is right hand dominant.  Pt states that this is her first permanent HD access; this seems to have been placed by a surgeon at [facility]..   She states she does not walk since she fell about a year ago, and had left femoral nailing in April 2018.  She denies any open wounds in her feet or hands.    Past Medical History:  Diagnosis Date  . Absent pedal pulses   . Anemia of chronic renal failure 10/05/2010   Nice response to Procrit   . Anemia, iron deficiency 08/18/2012  . Ataxia involving legs    has had several falls  . CAD (coronary artery disease), native coronary artery 07/05/2016   3VD by cath 2008  01/22/07 CABG with LIMA to LAD< SVG to dx, SVG to OM1-2, SVG to PDA Dr. Roxan Hockey  . Chronic diastolic heart failure ([place]) 05/23/2014  . Dementia ([place])    "mild" per MD note  . Diabetes mellitus   . ESRD on hemodialysis ([place])   . Family history of anesthesia complication    Nephew- had time waking  . Hyperlipidemia   . Hypertensive heart disease without CHF 11/10/2013  . Neuropathy   . Osteoarthritis 01/01/2013  . Type 2 diabetes mellitus with renal manifestations ([place]) 02/01/2014    Social History Social History   Tobacco Use  . Smoking status: Never Smoker  . Smokeless tobacco: Never Used  Substance Use Topics  . Alcohol use: No  . Drug  use: No    Family History Family History  Problem Relation Age of Onset  . Diabetes Father   . Cancer Sister   . Aneurysm Mother     Surgical History Past Surgical History:  Procedure Laterality Date  . CATARACT EXTRACTION     cataract  . CORONARY ARTERY BYPASS GRAFT  2008  . EYE SURGERY    . FEMUR FRACTURE SURGERY    . FEMUR IM NAIL Left 07/05/2016   Procedure: INTRAMEDULLARY (IM) RETROGRADE FEMORAL NAILING LEFT;  Surgeon: Marybelle Killings, MD;  Location: [place];  Service: Orthopedics;  Laterality: Left;  . INSERTION OF DIALYSIS CATHETER Left 09/05/2013   Procedure: INSERTION OF DIALYSIS CATHETER;  Surgeon: Conrad Ali Chukson, MD;  Location: [place];  Service: Vascular;  Laterality: Left;  . IR DIALY SHUNT INTRO [place] W/IMG LEFT Left 09/15/2016  . IR DIALY SHUNT INTRO NEEDLE/INTRACATH INITIAL W/IMG LEFT Left 04/08/2017  . IR DIALY SHUNT INTRO NEEDLE/INTRACATH INITIAL W/IMG LEFT Left 03/09/2018  . IR US GUIDE VASC ACCESS LEFT  03/09/2018    No Known Allergies  Current Outpatient Medications  Medication Sig Dispense Refill  . acetaminophen-codeine (TYLENOL #3) 300-30 MG tablet Take 1 tablet by mouth every 6 (six) hours as needed for moderate pain. 30 tablet 0  . amiodarone (  PACERONE) 200 MG tablet Take 1 tablet (200 mg total) by mouth daily.    . calcitRIOL (ROCALTROL) 0.5 MCG capsule Take 1 capsule (0.5 mcg total) by mouth Every Tuesday,Thursday,and Saturday with dialysis.    Marland Kitchen carvedilol (COREG) 6.25 MG tablet Take 1 tablet (6.25 mg total) by mouth 2 (two) times daily with a meal.    . donepezil (ARICEPT) 10 MG tablet Take 5 mg by mouth at bedtime. For MEMORY    . enoxaparin (LOVENOX) 80 MG/0.8ML injection Inject 0.7 mLs (70 mg total) into the skin daily. Please continue until INR above 2 then stop 22.4 Syringe   . erythromycin ophthalmic ointment Place 1 application into both eyes at bedtime. For keratitis    . ferrous sulfate 325 (65 FE) MG tablet Take 325 mg by mouth 2  (two) times daily. As SUPPLEMENT    . folic acid-vitamin b complex-vitamin c-selenium-zinc (DIALYVITE) 3 MG TABS tablet Take 1 tablet by mouth daily.    Marland Kitchen gabapentin (NEURONTIN) 300 MG capsule Take 300 mg by mouth at bedtime. For NEUROPATHY/NERVE PAIN    . insulin aspart (NOVOLOG) 100 UNIT/ML injection Inject 0-9 Units into the skin 3 (three) times daily with meals. Sliding scale CBG 70 - 120: 0 units CBG 121 - 150: 1 unit,  CBG 151 - 200: 2 units,  CBG 201 - 250: 3 units,  CBG 251 - 300: 5 units,  CBG 301 - 350: 7 units,  CBG 351 - 400: 9 units   CBG > 400: 9 units and notify your MD 10 mL 11  . insulin glargine (LANTUS) 100 UNIT/ML injection Inject 0.05 mLs (5 Units total) into the skin at bedtime. 10 mL 11  . ketoconazole (NIZORAL) 2 % cream Apply 1 application topically daily.    Marland Kitchen lidocaine-prilocaine (EMLA) cream Apply 1 application topically as needed (On Tues, Thu,and Sat.).    Marland Kitchen loperamide (IMODIUM A-D) 2 MG tablet Take 2 mg by mouth 4 (four) times daily as needed for diarrhea or loose stools.    . menthol-cetylpyridinium (CEPACOL) 3 MG lozenge Take 1 lozenge by mouth as needed for sore throat.    . multivitamin (RENA-VIT) TABS tablet Take 1 tablet by mouth at bedtime.  0  . Nutritional Supplements (FEEDING SUPPLEMENT, NEPRO CARB STEADY,) LIQD Take 237 mLs by mouth 2 (two) times daily between meals.  0  . ondansetron (ZOFRAN) 4 MG tablet Take 4 mg by mouth every 8 (eight) hours as needed for nausea or vomiting.    Marland Kitchen Propylene Glycol (SYSTANE BALANCE) 0.6 % SOLN Apply 1 drop to eye 2 (two) times daily.    Marland Kitchen rOPINIRole (REQUIP) 1 MG tablet Take 1 mg by mouth 2 (two) times daily.    . rosuvastatin (CRESTOR) 5 MG tablet Take 5 mg by mouth at bedtime. For CHOLESTEROL    . sevelamer carbonate (RENVELA) 800 MG tablet Take 1 tablet (800 mg total) by mouth 3 (three) times daily with meals.    . warfarin (COUMADIN) 4 MG tablet Take 1 tablet (4 mg total) by mouth daily at 6 PM. Please adjust dose  according to PT/INR, continue for 3 months     No current facility-administered medications for this visit.      REVIEW OF SYSTEMS: see HPI for pertinent positives and negatives    PHYSICAL EXAMINATION:  Vitals:   04/15/18 1443  BP: (!) 116/59  Pulse: 75  Resp: 20  Temp: 98.7 F (37.1 C)  SpO2: 95%  Weight: 170 lb (77.1  kg)  Height: 5\' 6"  (1.676 m)   Body mass index is 27.44 kg/m.  General: Elderly female seated in a w/c. HEENT:  No gross abnormalities Pulmonary: Respirations are non-labored Abdomen: Soft and non-tender  Musculoskeletal: There are no major deformities.  Left hand is slightly smaller than right. Neurologic: Hand grip: right is 5/5, left is 4/5. Decreased sensation to touch in left hand compared to right hand. Skin: There are no ulcer or rashes noted. Psychiatric: The patient has normal affect. Cardiovascular: There is a regular rate and rhythm without significant murmur appreciated. Right radial pulse is 1+ palpable, left is not palpable. There is a palpable thrill and audible bruit at the left upper arm AVF.    Non-Invasive Vascular Imaging  Dialysis Access Duplex  (Date: 04/15/2018):  Findings: +--------------------+----------+-----------------+--------+ AVF                 PSV (cm/s)Flow Vol (mL/min)Comments +--------------------+----------+-----------------+--------+ Native artery inflow   323           512                +--------------------+----------+-----------------+--------+ AVF Anastomosis        398                              +--------------------+----------+-----------------+--------+    +------------+----------+-------------+----------+--------------+ OUTFLOW VEINPSV (cm/s)Diameter (cm)Depth (cm)   Describe    +------------+----------+-------------+----------+--------------+ Prox UA         70        0.82        1.11   mural  thrombus +------------+----------+-------------+----------+--------------+ Mid UA          50        1.88        0.34   mural thrombus +------------+----------+-------------+----------+--------------+ Dist UA        605        0.55        0.22                  +------------+----------+-------------+----------+--------------+ AC Fossa       189        0.84        0.22                  +------------+----------+-------------+----------+--------------+     +-----------------+-------------+----------+---------+----------+--------------+                  Diameter (cm)Depth (cm)BranchingPSV (cm/s) Flow Volume                                                                 (ml/min)    +-----------------+-------------+----------+---------+----------+--------------+ distal radial                                        49                   artery                                                                    +-----------------+-------------+----------+---------+----------+--------------+  dsital radial                                        58                   artery with                                                               partial fistula                                                           compression                                                               +-----------------+-------------+----------+---------+----------+--------------+ distal radial                                        80                   artery with                                                               fistula                                                                   compression                                                               +-----------------+-------------+----------+---------+----------+--------------+  Summary: Patent left brachiocephalic AVF with thrombus in the  outflow vein in the mid to upper arm. There is stenosis in the outflow vein in the distal upper arm due to change in vessel diameter. No thrombus or valve noted at this area. There is slight increase in distal radial artery velocity with fistula compression.   Medical Decision Making  TRISTON LISANTI is a 85 y.o. female who was referred by Dr. Marval Regal for evaluation of left upper arm AV fistula difficult cannulation.  Pt states the difficult cannulation was about a month  ago, but she reports that there has been no difficulty cannulation her left upper arm AVF in the last 2 weeks.  Pt had a fistulagram last month, 03-09-18 by another provider, Linda Hedges, NP; I reviewed the results of the fistula gram, no problems found with the AVF circuit. I discussed this with Dr. Scot Dock, and the results of fistula duplex from today. Pt states there has been no trouble accessing her AVF in the last 2 weeks. Since there has been no difficulty accessing her AVF in the last 2 weeks, there is no need to intervene at this time.  It appears that her AVF was created at [facility]. Follow up as needed.     Clemon Chambers, RN, MSN, FNP-C [facility] Office: [phone]  04/15/2018, 2:56 PM  Clinic MD: Scot Dock in vein clinic,

## 2018-05-22 IMAGING — DX DG CHEST 2V
2 series · 2 of 2 positions shown · non-contrast
Comparison: 08/18/2016

CLINICAL DATA: Cough and shortness-of-breath.

EXAM:
CHEST - 2 VIEW

[view not recorded]
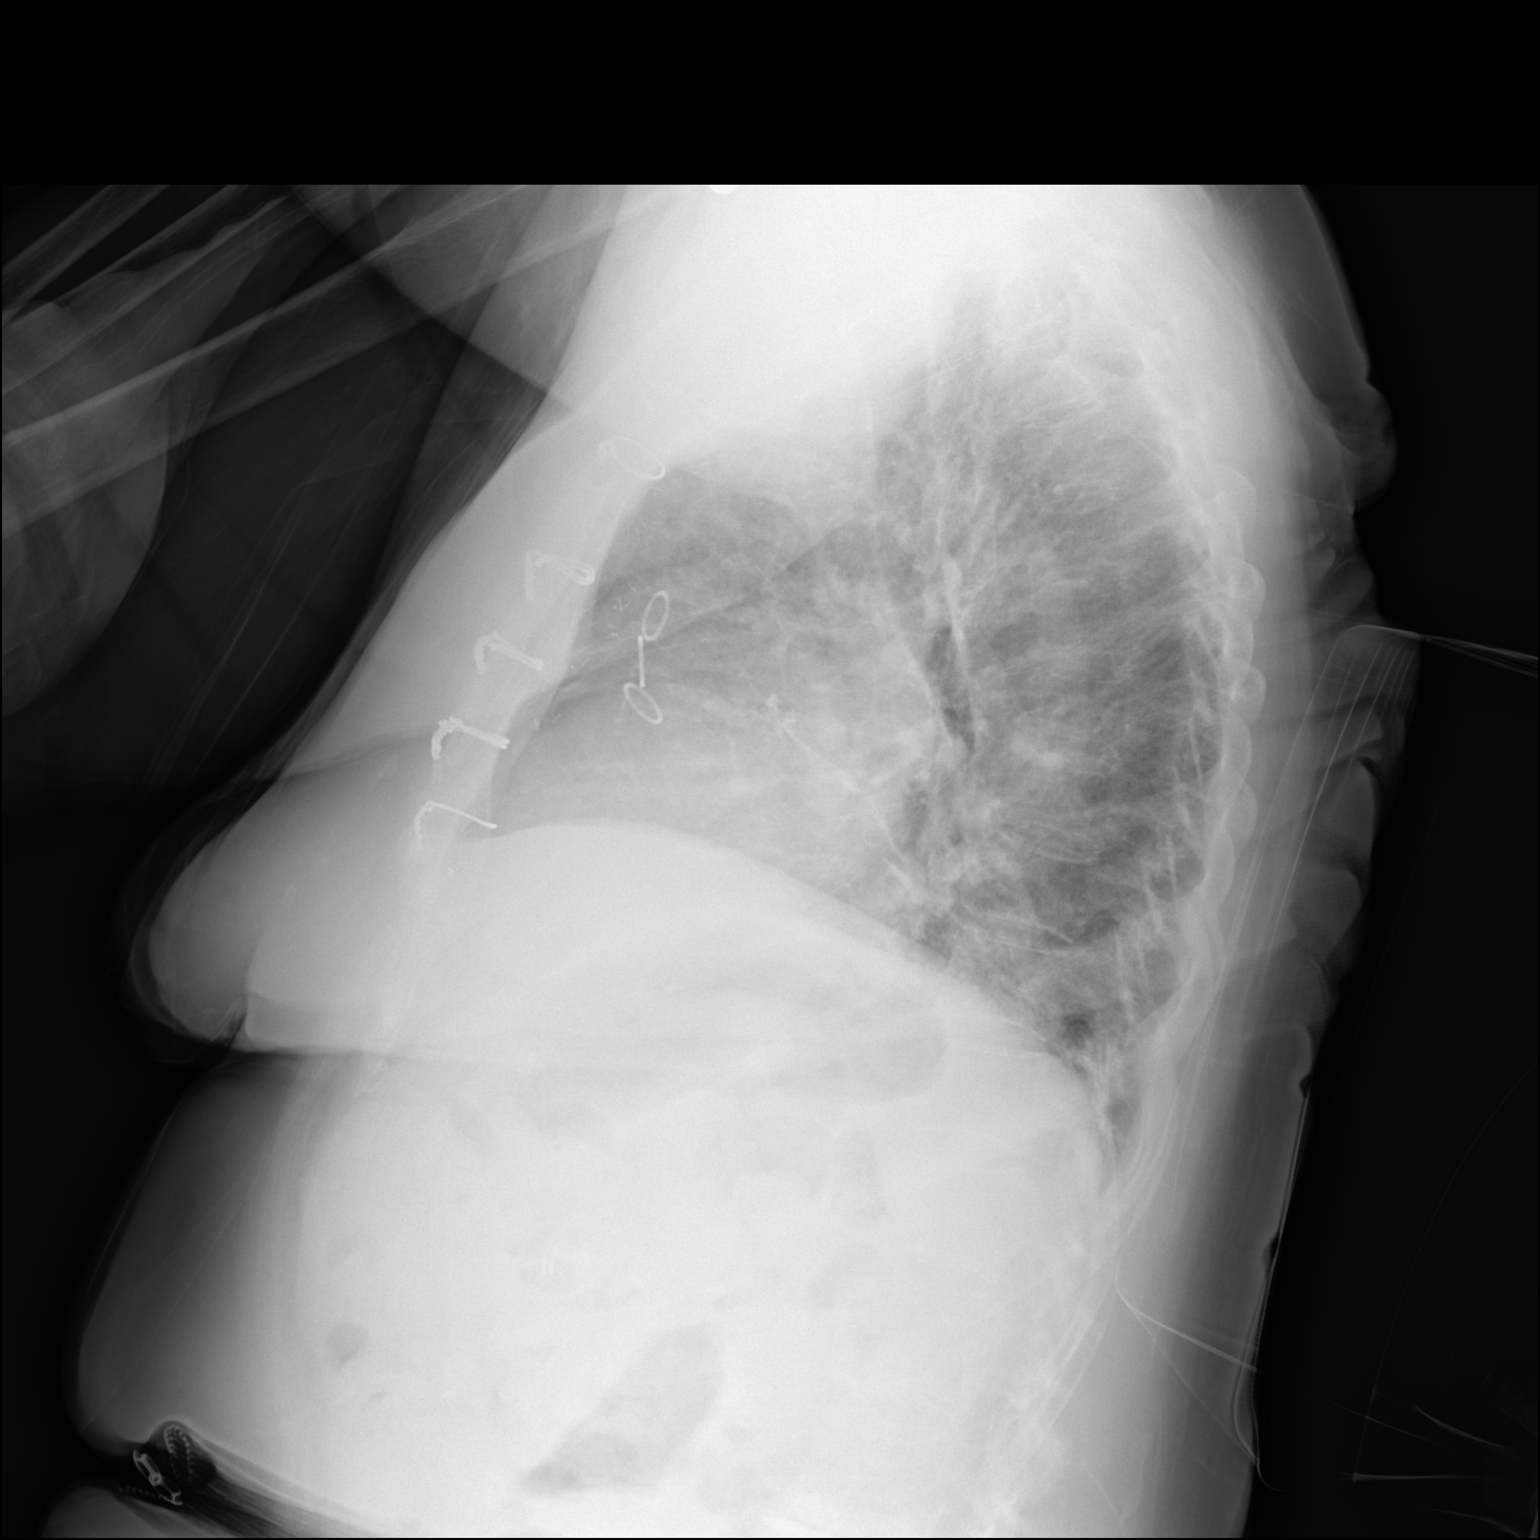

[dg chest 1 view]
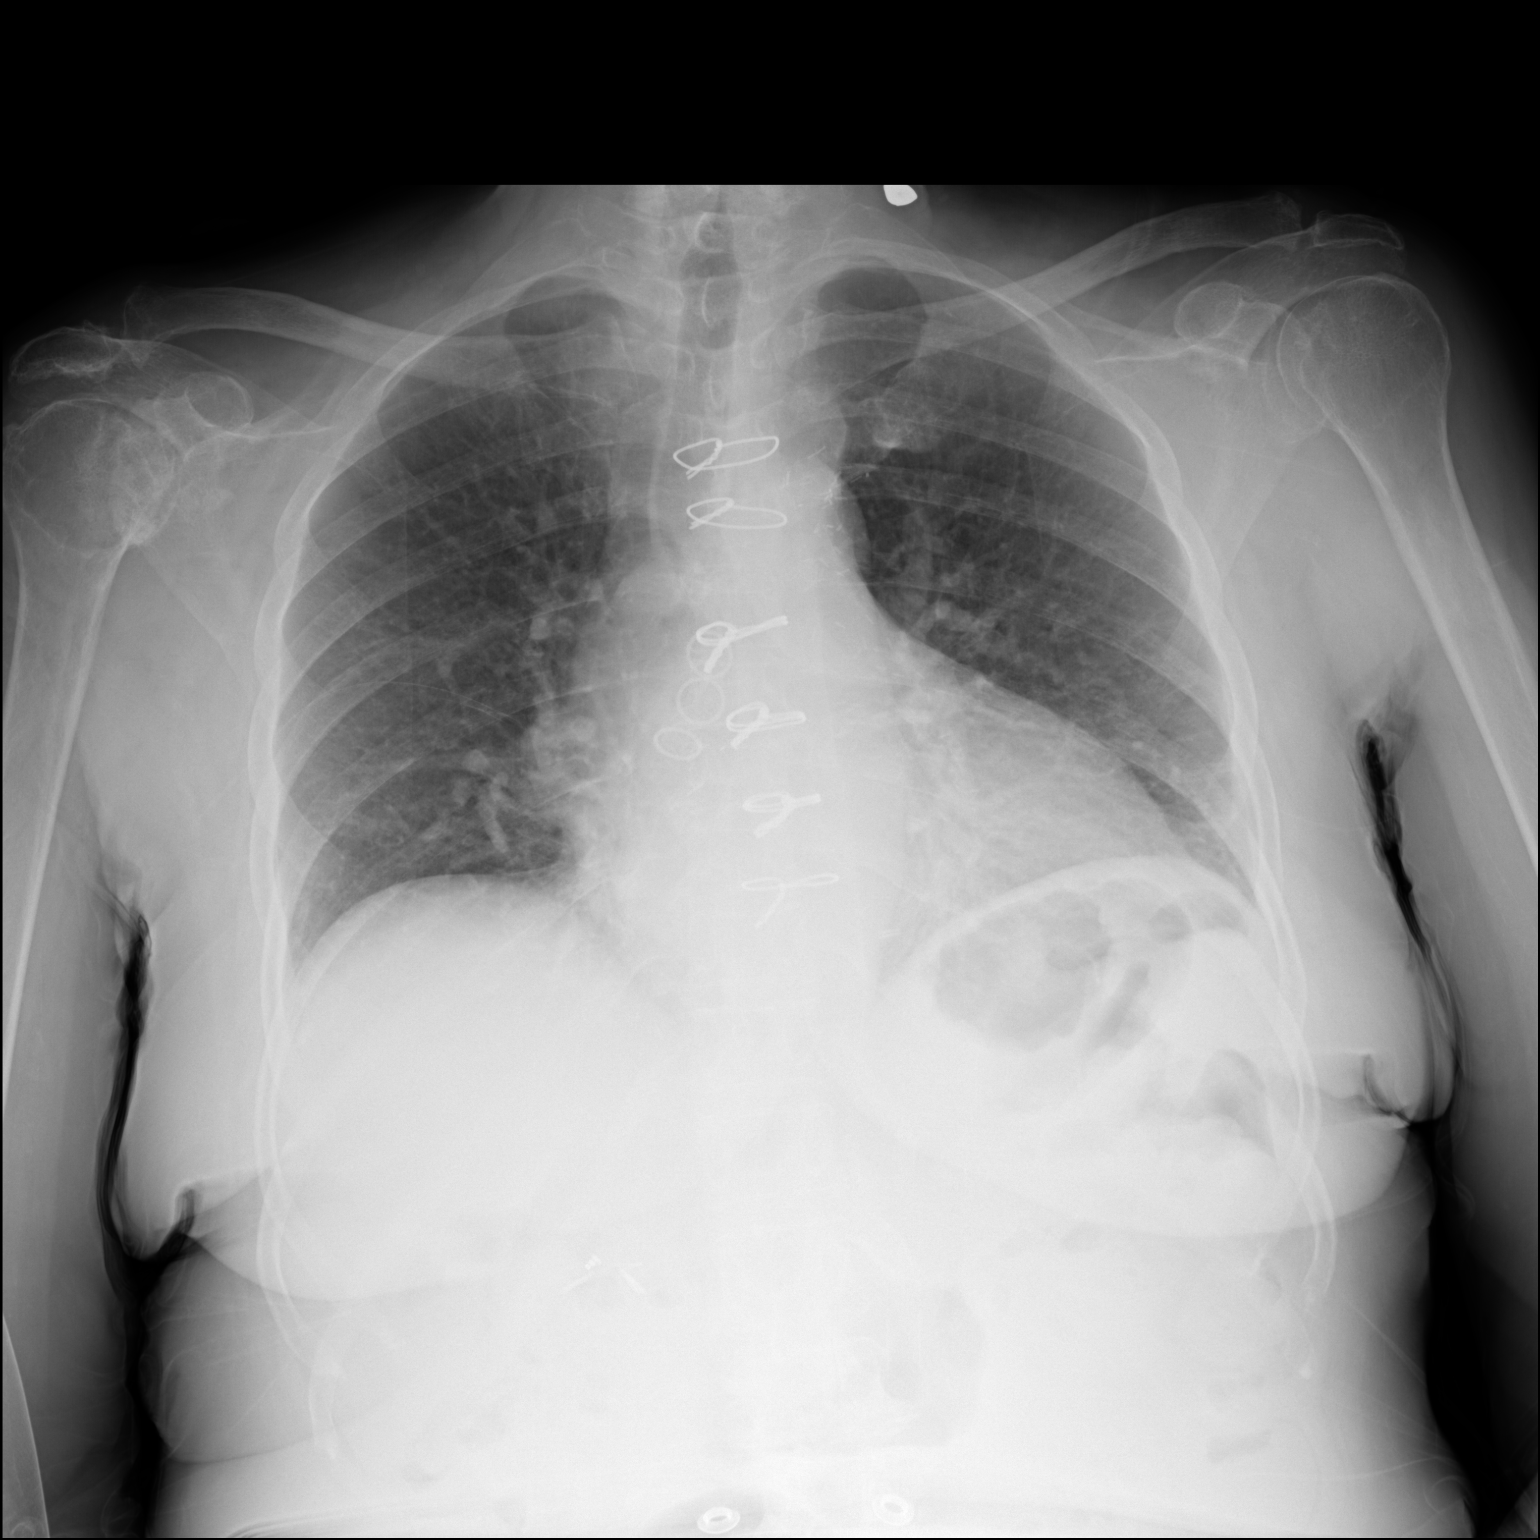

[2 of 2 positions shown; findings below may reference images not displayed]

FINDINGS: Lungs are hypoinflated without focal airspace consolidation or
effusion. Mild stable cardiomegaly. Remainder of the exam is
unchanged.
IMPRESSION: Hypoinflation without acute cardiopulmonary disease.

Mild stable cardiomegaly.

## 2018-05-25 ENCOUNTER — Other Ambulatory Visit: Payer: Self-pay

## 2018-05-25 DIAGNOSIS — T82590A Other mechanical complication of surgically created arteriovenous fistula, initial encounter: Secondary | ICD-10-CM

## 2018-05-28 ENCOUNTER — Encounter (HOSPITAL_COMMUNITY): Payer: Self-pay

## 2018-05-28 ENCOUNTER — Telehealth: Payer: Self-pay | Admitting: Vascular Surgery

## 2018-05-28 ENCOUNTER — Telehealth: Payer: Self-pay | Admitting: *Deleted

## 2018-05-28 ENCOUNTER — Other Ambulatory Visit: Payer: Self-pay | Admitting: *Deleted

## 2018-05-28 ENCOUNTER — Encounter: Payer: Self-pay | Admitting: Family

## 2018-05-28 ENCOUNTER — Encounter: Payer: Self-pay | Admitting: *Deleted

## 2018-05-28 ENCOUNTER — Ambulatory Visit (HOSPITAL_COMMUNITY)
Admission: RE | Admit: 2018-05-28 | Discharge: 2018-05-28 | Disposition: A | Discharge status: Home / Self Care | Payer: Medicare (Managed Care) | Source: Ambulatory Visit | Attending: Vascular Surgery | Admitting: Vascular Surgery

## 2018-05-28 ENCOUNTER — Ambulatory Visit (INDEPENDENT_AMBULATORY_CARE_PROVIDER_SITE_OTHER): Payer: Medicare (Managed Care) | Admitting: Physician Assistant

## 2018-05-28 ENCOUNTER — Other Ambulatory Visit: Payer: Self-pay

## 2018-05-28 VITALS — BP 123/59 | HR 69 | Temp 98.1°F | Resp 16 | Ht 66.0 in | Wt 176.0 lb

## 2018-05-28 DIAGNOSIS — T82590A Other mechanical complication of surgically created arteriovenous fistula, initial encounter: Secondary | ICD-10-CM

## 2018-05-28 NOTE — Progress Notes (Unsigned)
Procedure scheduled with LINDSAY AT [facility]. They will forward letter of instructions and arrange transportation for patient. Letter of instructions faxed to [facility] .

## 2018-05-28 NOTE — Progress Notes (Signed)
History of Present Illness:  Patient is a 85 y.o. year old female who presents for evaluation of there left AV fistula. She has HD T-TH-Sat.  She underwent a shuntogram with IR 03/09/2018 that determined patent flow.  Since then it was reported to have difficult cannulation and prolonged flow times on HD.    She denies pain, loss of sensation and motor in the left hand.  She has CAD history with CABG and is on Coumadin with history of multifocal atrial tachycardia.    Past Medical History:  Diagnosis Date  . Absent pedal pulses   . Anemia of chronic renal failure 10/05/2010   Nice response to Procrit   . Anemia, iron deficiency 08/18/2012  . Ataxia involving legs    has had several falls  . CAD (coronary artery disease), native coronary artery 07/05/2016   3VD by cath 2008  01/22/07 CABG with LIMA to LAD< SVG to dx, SVG to OM1-2, SVG to PDA Dr. Roxan Hockey  . Chronic diastolic heart failure ([place]) 05/23/2014  . Dementia ([place])    "mild" per MD note  . Diabetes mellitus   . ESRD on hemodialysis ([place])   . Family history of anesthesia complication    Nephew- had time waking  . Hyperlipidemia   . Hypertensive heart disease without CHF 11/10/2013  . Neuropathy   . Osteoarthritis 01/01/2013  . Type 2 diabetes mellitus with renal manifestations ([place]) 02/01/2014    Past Surgical History:  Procedure Laterality Date  . CATARACT EXTRACTION     cataract  . CORONARY ARTERY BYPASS GRAFT  2008  . EYE SURGERY    . FEMUR FRACTURE SURGERY    . FEMUR IM NAIL Left 07/05/2016   Procedure: INTRAMEDULLARY (IM) RETROGRADE FEMORAL NAILING LEFT;  Surgeon: Marybelle Killings, MD;  Location: [place];  Service: Orthopedics;  Laterality: Left;  . INSERTION OF DIALYSIS CATHETER Left 09/05/2013   Procedure: INSERTION OF DIALYSIS CATHETER;  Surgeon: Conrad [place], MD;  Location: [place];  Service: Vascular;  Laterality: Left;  . IR DIALY SHUNT INTRO [place] W/IMG LEFT Left 09/15/2016  . IR DIALY SHUNT INTRO  [place]/INTRACATH INITIAL W/IMG LEFT Left 04/08/2017  . IR DIALY SHUNT INTRO NEEDLE/INTRACATH INITIAL W/IMG LEFT Left 03/09/2018  . IR US GUIDE VASC ACCESS LEFT  03/09/2018     Social History Social History   Tobacco Use  . Smoking status: Never Smoker  . Smokeless tobacco: Never Used  Substance Use Topics  . Alcohol use: No  . Drug use: No    Family History Family History  Problem Relation Age of Onset  . Diabetes Father   . Cancer Sister   . Aneurysm Mother     Allergies  No Known Allergies   Current Outpatient Medications  Medication Sig Dispense Refill  . acetaminophen-codeine (TYLENOL #3) 300-30 MG tablet Take 1 tablet by mouth every 6 (six) hours as needed for moderate pain. 30 tablet 0  . aspirin 81 MG chewable tablet Chew by mouth daily.    . calcitRIOL (ROCALTROL) 0.5 MCG capsule Take 1 capsule (0.5 mcg total) by mouth Every Tuesday,Thursday,and Saturday with dialysis.    Marland Kitchen cilostazol (PLETAL) 100 MG tablet Take by mouth.    . donepezil (ARICEPT) 10 MG tablet Take 5 mg by mouth at bedtime. For MEMORY    . erythromycin ophthalmic ointment Place 1 application into both eyes at bedtime. For keratitis    . ferrous sulfate 325 (65 FE) MG tablet Take 325 mg by  mouth 2 (two) times daily. As SUPPLEMENT    . folic acid-vitamin b complex-vitamin c-selenium-zinc (DIALYVITE) 3 MG TABS tablet Take 1 tablet by mouth daily.    Marland Kitchen gabapentin (NEURONTIN) 300 MG capsule Take 300 mg by mouth at bedtime. For NEUROPATHY/NERVE PAIN    . insulin aspart (NOVOLOG) 100 UNIT/ML injection Inject 0-9 Units into the skin 3 (three) times daily with meals. Sliding scale CBG 70 - 120: 0 units CBG 121 - 150: 1 unit,  CBG 151 - 200: 2 units,  CBG 201 - 250: 3 units,  CBG 251 - 300: 5 units,  CBG 301 - 350: 7 units,  CBG 351 - 400: 9 units   CBG > 400: 9 units and notify your MD 10 mL 11  . insulin glargine (LANTUS) 100 UNIT/ML injection Inject 0.05 mLs (5 Units total) into the skin at bedtime. 10 mL 11    . ketoconazole (NIZORAL) 2 % cream Apply 1 application topically daily.    Marland Kitchen lidocaine-prilocaine (EMLA) cream Apply 1 application topically as needed (On Tues, Thu,and Sat.).    Marland Kitchen loperamide (IMODIUM A-D) 2 MG tablet Take 2 mg by mouth 4 (four) times daily as needed for diarrhea or loose stools.    . menthol-cetylpyridinium (CEPACOL) 3 MG lozenge Take 1 lozenge by mouth as needed for sore throat.    . multivitamin (RENA-VIT) TABS tablet Take 1 tablet by mouth at bedtime.  0  . Nutritional Supplements (FEEDING SUPPLEMENT, NEPRO CARB STEADY,) LIQD Take 237 mLs by mouth 2 (two) times daily between meals.  0  . ondansetron (ZOFRAN) 4 MG tablet Take 4 mg by mouth every 8 (eight) hours as needed for nausea or vomiting.    Marland Kitchen Propylene Glycol (SYSTANE BALANCE) 0.6 % SOLN Apply 1 drop to eye 2 (two) times daily.    Marland Kitchen rOPINIRole (REQUIP) 1 MG tablet Take 1 mg by mouth 2 (two) times daily.    . rosuvastatin (CRESTOR) 5 MG tablet Take 5 mg by mouth at bedtime. For CHOLESTEROL    . sevelamer carbonate (RENVELA) 800 MG tablet Take 1 tablet (800 mg total) by mouth 3 (three) times daily with meals.    Marland Kitchen amiodarone (PACERONE) 200 MG tablet Take 1 tablet (200 mg total) by mouth daily. (Patient not taking: Reported on 05/28/2018)    . carvedilol (COREG) 6.25 MG tablet Take 1 tablet (6.25 mg total) by mouth 2 (two) times daily with a meal.    . enoxaparin (LOVENOX) 80 MG/0.8ML injection Inject 0.7 mLs (70 mg total) into the skin daily. Please continue until INR above 2 then stop (Patient not taking: Reported on 05/28/2018) 22.4 Syringe   . warfarin (COUMADIN) 4 MG tablet Take 1 tablet (4 mg total) by mouth daily at 6 PM. Please adjust dose according to PT/INR, continue for 3 months (Patient not taking: Reported on 05/28/2018)     No current facility-administered medications for this visit.     ROS:   General:  No weight loss, Fever, chills  HEENT: No recent headaches, no nasal bleeding, no visual changes, no sore  throat  Neurologic: No dizziness, blackouts, seizures. No recent symptoms of stroke or mini- stroke. No recent episodes of slurred speech, or temporary blindness.  Cardiac: No recent episodes of chest pain/pressure, no shortness of breath at rest.  No shortness of breath with exertion.  + history of atrial fibrillation or irregular heartbeat  Vascular: No history of rest pain in feet.  No history of claudication.  No history  of non-healing ulcer, No history of DVT   Pulmonary: No home oxygen, no productive cough, no hemoptysis,  No asthma or wheezing  Musculoskeletal:  [x ] Arthritis, [ ]  Low back pain,  [ ]  Joint pain  Hematologic:No history of hypercoagulable state.  No history of easy bleeding.  No history of anemia  Gastrointestinal: No hematochezia or melena,  No gastroesophageal reflux, no trouble swallowing  Urinary: [ ]  chronic Kidney disease, [x ] on HD - [ ]  MWF or [x ] TTHS, [ ]  Burning with urination, [ ]  Frequent urination, [ ]  Difficulty urinating;   Skin: No rashes  Psychological: No history of anxiety,  No history of depression   Physical Examination  Vitals:   05/28/18 0826  BP: (!) 123/59  Pulse: 69  Resp: 16  Temp: 98.1 F (36.7 C)  TempSrc: Oral  SpO2: 99%  Weight: 176 lb (79.8 kg)  Height: 5\' 6"  (1.676 m)    Body mass index is 28.41 kg/m.  General:  Alert and oriented, no acute distress HEENT: Normal, normocephalic Neck: No bruit or JVD Pulmonary: Clear to auscultation bilaterally Cardiac: Regular Rate and Rhythm without murmur Gastrointestinal: Soft, non-tender, non-distended, no mass, no scars Skin: No rash Extremity Pulses:   radial, brachial pulses bilaterally.  Palpable pulse and weaker thrill in left AV fistula Musculoskeletal: No deformity or edema  Neurologic: Upper and lower extremity motor 5/5 and symmetric  DATA:  Duplex 04/15/2018 +------------+----------+-------------+----------+--------------+ OUTFLOW VEINPSV (cm/s)Diameter  (cm)Depth (cm)   Describe    +------------+----------+-------------+----------+--------------+ Prox UA         70        0.82        1.11   mural thrombus +------------+----------+-------------+----------+--------------+ Mid UA          50        1.88        0.34   mural thrombus +------------+----------+-------------+----------+--------------+ Dist UA        605        0.55        0.22                  +------------+----------+-------------+----------+--------------+ AC Fossa       189        0.84        0.22                  +------------+----------+-------------+----------+--------------+    ASSESSMENT:  Poorly functioning left AV fistula  PLAN: Flow changes in the out flow with a drop off in velocity  I have scheduled her for fistulogram.  She will have to hold her Coumadin. HD TTS.    Roxy Horseman PA-C [facility] Office: [phone]  MD in clinic [place]

## 2018-05-28 NOTE — Telephone Encounter (Signed)
Fax received by [name].

## 2018-05-28 NOTE — Telephone Encounter (Signed)
I called and spoke to Kennyth Lose, nurse at [facility], informing her of patient's procedure on 06/02/2018 with Dr. Carlis Abbott.  She verbalized understanding and will have the patient at [facility] hospital.  Thurston Hole., LPN

## 2018-06-02 ENCOUNTER — Encounter (HOSPITAL_COMMUNITY)
Admission: RE | Disposition: A | Discharge status: Home / Self Care | Payer: Self-pay | Source: Home / Self Care | Attending: Vascular Surgery

## 2018-06-02 ENCOUNTER — Encounter (HOSPITAL_COMMUNITY): Payer: Self-pay | Admitting: Vascular Surgery

## 2018-06-02 ENCOUNTER — Other Ambulatory Visit: Payer: Self-pay

## 2018-06-02 ENCOUNTER — Ambulatory Visit (HOSPITAL_COMMUNITY)
Admission: RE | Admit: 2018-06-02 | Discharge: 2018-06-02 | Disposition: A | Discharge status: Home / Self Care | Payer: Medicare (Managed Care) | Attending: Vascular Surgery | Admitting: Vascular Surgery

## 2018-06-02 DIAGNOSIS — Z7982 Long term (current) use of aspirin: Secondary | ICD-10-CM | POA: Insufficient documentation

## 2018-06-02 DIAGNOSIS — D631 Anemia in chronic kidney disease: Secondary | ICD-10-CM | POA: Diagnosis not present

## 2018-06-02 DIAGNOSIS — M1990 Unspecified osteoarthritis, unspecified site: Secondary | ICD-10-CM | POA: Insufficient documentation

## 2018-06-02 DIAGNOSIS — Z794 Long term (current) use of insulin: Secondary | ICD-10-CM | POA: Diagnosis not present

## 2018-06-02 DIAGNOSIS — I471 Supraventricular tachycardia: Secondary | ICD-10-CM | POA: Diagnosis not present

## 2018-06-02 DIAGNOSIS — Z951 Presence of aortocoronary bypass graft: Secondary | ICD-10-CM | POA: Diagnosis not present

## 2018-06-02 DIAGNOSIS — I5032 Chronic diastolic (congestive) heart failure: Secondary | ICD-10-CM | POA: Diagnosis not present

## 2018-06-02 DIAGNOSIS — Z992 Dependence on renal dialysis: Secondary | ICD-10-CM | POA: Insufficient documentation

## 2018-06-02 DIAGNOSIS — Y832 Surgical operation with anastomosis, bypass or graft as the cause of abnormal reaction of the patient, or of later complication, without mention of misadventure at the time of the procedure: Secondary | ICD-10-CM | POA: Insufficient documentation

## 2018-06-02 DIAGNOSIS — N186 End stage renal disease: Secondary | ICD-10-CM | POA: Diagnosis not present

## 2018-06-02 DIAGNOSIS — Z8249 Family history of ischemic heart disease and other diseases of the circulatory system: Secondary | ICD-10-CM | POA: Insufficient documentation

## 2018-06-02 DIAGNOSIS — T82858A Stenosis of vascular prosthetic devices, implants and grafts, initial encounter: Secondary | ICD-10-CM | POA: Diagnosis not present

## 2018-06-02 DIAGNOSIS — Z79899 Other long term (current) drug therapy: Secondary | ICD-10-CM | POA: Diagnosis not present

## 2018-06-02 DIAGNOSIS — F0390 Unspecified dementia without behavioral disturbance: Secondary | ICD-10-CM | POA: Insufficient documentation

## 2018-06-02 DIAGNOSIS — E1122 Type 2 diabetes mellitus with diabetic chronic kidney disease: Secondary | ICD-10-CM | POA: Diagnosis not present

## 2018-06-02 DIAGNOSIS — E785 Hyperlipidemia, unspecified: Secondary | ICD-10-CM | POA: Diagnosis not present

## 2018-06-02 DIAGNOSIS — I2510 Atherosclerotic heart disease of native coronary artery without angina pectoris: Secondary | ICD-10-CM | POA: Diagnosis not present

## 2018-06-02 DIAGNOSIS — E1140 Type 2 diabetes mellitus with diabetic neuropathy, unspecified: Secondary | ICD-10-CM | POA: Diagnosis not present

## 2018-06-02 DIAGNOSIS — Z7901 Long term (current) use of anticoagulants: Secondary | ICD-10-CM | POA: Diagnosis not present

## 2018-06-02 DIAGNOSIS — I132 Hypertensive heart and chronic kidney disease with heart failure and with stage 5 chronic kidney disease, or end stage renal disease: Secondary | ICD-10-CM | POA: Insufficient documentation

## 2018-06-02 DIAGNOSIS — Z833 Family history of diabetes mellitus: Secondary | ICD-10-CM | POA: Diagnosis not present

## 2018-06-02 DIAGNOSIS — T82898A Other specified complication of vascular prosthetic devices, implants and grafts, initial encounter: Secondary | ICD-10-CM

## 2018-06-02 HISTORY — PX: PERIPHERAL VASCULAR INTERVENTION: CATH118257

## 2018-06-02 HISTORY — PX: A/V FISTULAGRAM: CATH118298

## 2018-06-02 LAB — POCT I-STAT 4, (NA,K, GLUC, HGB,HCT)
Glucose, Bld: 251 mg/dL — ABNORMAL HIGH (ref 70–99)
HCT: 36 % (ref 36.0–46.0)
Hemoglobin: 12.2 g/dL (ref 12.0–15.0)
Potassium: 3.3 mmol/L — ABNORMAL LOW (ref 3.5–5.1)
SODIUM: 137 mmol/L (ref 135–145)

## 2018-06-02 LAB — PROTIME-INR
INR: 1 (ref 0.8–1.2)
Prothrombin Time: 12.9 seconds (ref 11.4–15.2)

## 2018-06-02 SURGERY — A/V FISTULAGRAM
Anesthesia: LOCAL | Laterality: Left

## 2018-06-02 MED ORDER — IODIXANOL 320 MG/ML IV SOLN
INTRAVENOUS | Status: DC | PRN
  Administered 2018-06-02: 60 mL

## 2018-06-02 MED ORDER — HEPARIN SODIUM (PORCINE) 1000 UNIT/ML IJ SOLN
INTRAMUSCULAR | Status: AC
  Filled 2018-06-02: qty 1

## 2018-06-02 MED ORDER — HEPARIN (PORCINE) IN NACL 1000-0.9 UT/500ML-% IV SOLN
INTRAVENOUS | Status: DC | PRN
  Administered 2018-06-02: 500 mL

## 2018-06-02 MED ORDER — HEPARIN SODIUM (PORCINE) 1000 UNIT/ML IJ SOLN
INTRAMUSCULAR | Status: DC | PRN
  Administered 2018-06-02: 3000 [IU] via INTRAVENOUS

## 2018-06-02 MED ORDER — LIDOCAINE HCL (PF) 1 % IJ SOLN
INTRAMUSCULAR | Status: DC | PRN
  Administered 2018-06-02: 2 mL via INTRADERMAL

## 2018-06-02 MED ORDER — LIDOCAINE HCL (PF) 1 % IJ SOLN
INTRAMUSCULAR | Status: AC
  Filled 2018-06-02: qty 30

## 2018-06-02 MED ORDER — SODIUM CHLORIDE 0.9% FLUSH: 3.0000 mL | INTRAVENOUS | Status: DC | PRN

## 2018-06-02 SURGICAL SUPPLY — 16 items
BAG SNAP BAND KOVER 36X36 (MISCELLANEOUS) ×2 IMPLANT
BALLN LUTONIX AV 7X40X75 (BALLOONS) ×2
BALLN MUSTANG 6.0X40 75 (BALLOONS) ×2
BALLOON LUTONIX AV 7X40X75 (BALLOONS) ×1 IMPLANT
BALLOON MUSTANG 6.0X40 75 (BALLOONS) ×1 IMPLANT
COVER DOME SNAP 22 D (MISCELLANEOUS) ×2 IMPLANT
KIT ENCORE 26 ADVANTAGE (KITS) ×2 IMPLANT
KIT MICROPUNCTURE NIT STIFF (SHEATH) ×2 IMPLANT
PROTECTION STATION PRESSURIZED (MISCELLANEOUS) ×2
SHEATH PINNACLE R/O II 6F 4CM (SHEATH) ×2 IMPLANT
SHEATH PROBE COVER 6X72 (BAG) ×2 IMPLANT
STATION PROTECTION PRESSURIZED (MISCELLANEOUS) ×1 IMPLANT
STOPCOCK MORSE 400PSI 3WAY (MISCELLANEOUS) ×2 IMPLANT
TRAY PV CATH (CUSTOM PROCEDURE TRAY) ×2 IMPLANT
TUBING CIL FLEX 10 FLL-RA (TUBING) ×2 IMPLANT
WIRE BENTSON .035X145CM (WIRE) ×2 IMPLANT

## 2018-06-02 NOTE — H&P (Signed)
History and Physical Interval Note:  06/02/2018 8:22 AM  [name]  has presented today for surgery, with the diagnosis of Compication  The various methods of treatment have been discussed with the patient and family. After consideration of risks, benefits and other options for treatment, the patient has consented to  Procedure(s): A/V FISTULAGRAM (Left) as a surgical intervention .  The patient's history has been reviewed, patient examined, no change in status, stable for surgery.  I have reviewed the patient's chart and labs.  Questions were answered to the patient's satisfaction.     Left arm fistulogram.    [name]  History of Present Illness:  Patient is a 85 y.o. year old female who presents for evaluation of there left AV fistula. She has HD T-TH-Sat.  She underwent a shuntogram with IR 03/09/2018 that determined patent flow.  Since then it was reported to have difficult cannulation and prolonged flow times on HD.               She denies pain, loss of sensation and motor in the left hand.  She has CAD history with CABG and is on Coumadin with history of multifocal atrial tachycardia.        Past Medical History:  Diagnosis Date  . Absent pedal pulses   . Anemia of chronic renal failure 10/05/2010   Nice response to Procrit   . Anemia, iron deficiency 08/18/2012  . Ataxia involving legs    has had several falls  . CAD (coronary artery disease), native coronary artery 07/05/2016   3VD by cath 2008  01/22/07 CABG with LIMA to LAD< SVG to dx, SVG to OM1-2, SVG to PDA Dr. Roxan Hockey  . Chronic diastolic heart failure ([place]) 05/23/2014  . Dementia ([place])    "mild" per MD note  . Diabetes mellitus   . ESRD on hemodialysis ([place])   . Family history of anesthesia complication    Nephew- had time waking  . Hyperlipidemia   . Hypertensive heart disease without CHF 11/10/2013  . Neuropathy   . Osteoarthritis 01/01/2013  . Type 2 diabetes mellitus with renal  manifestations ([place]) 02/01/2014         Past Surgical History:  Procedure Laterality Date  . CATARACT EXTRACTION     cataract  . CORONARY ARTERY BYPASS GRAFT  2008  . EYE SURGERY    . FEMUR FRACTURE SURGERY    . FEMUR IM NAIL Left 07/05/2016   Procedure: INTRAMEDULLARY (IM) RETROGRADE FEMORAL NAILING LEFT;  Surgeon: Marybelle Killings, MD;  Location: [place];  Service: Orthopedics;  Laterality: Left;  . INSERTION OF DIALYSIS CATHETER Left 09/05/2013   Procedure: INSERTION OF DIALYSIS CATHETER;  Surgeon: Conrad [place], MD;  Location: [place];  Service: Vascular;  Laterality: Left;  . IR DIALY SHUNT INTRO [place] W/IMG LEFT Left 09/15/2016  . IR DIALY SHUNT INTRO NEEDLE/INTRACATH INITIAL W/IMG LEFT Left 04/08/2017  . IR DIALY SHUNT INTRO NEEDLE/INTRACATH INITIAL W/IMG LEFT Left 03/09/2018  . IR US GUIDE VASC ACCESS LEFT  03/09/2018     Social History Social History       Tobacco Use  . Smoking status: Never Smoker  . Smokeless tobacco: Never Used  Substance Use Topics  . Alcohol use: No  . Drug use: No    Family History      Family History  Problem Relation Age of Onset  . Diabetes Father   . Cancer Sister   . Aneurysm Mother  Allergies  No Known Allergies         Current Outpatient Medications  Medication Sig Dispense Refill  . acetaminophen-codeine (TYLENOL #3) 300-30 MG tablet Take 1 tablet by mouth every 6 (six) hours as needed for moderate pain. 30 tablet 0  . aspirin 81 MG chewable tablet Chew by mouth daily.    . calcitRIOL (ROCALTROL) 0.5 MCG capsule Take 1 capsule (0.5 mcg total) by mouth Every Tuesday,Thursday,and Saturday with dialysis.    Marland Kitchen cilostazol (PLETAL) 100 MG tablet Take by mouth.    . donepezil (ARICEPT) 10 MG tablet Take 5 mg by mouth at bedtime. For MEMORY    . erythromycin ophthalmic ointment Place 1 application into both eyes at bedtime. For keratitis    . ferrous sulfate 325 (65 FE) MG tablet Take  325 mg by mouth 2 (two) times daily. As SUPPLEMENT    . folic acid-vitamin b complex-vitamin c-selenium-zinc (DIALYVITE) 3 MG TABS tablet Take 1 tablet by mouth daily.    Marland Kitchen gabapentin (NEURONTIN) 300 MG capsule Take 300 mg by mouth at bedtime. For NEUROPATHY/NERVE PAIN    . insulin aspart (NOVOLOG) 100 UNIT/ML injection Inject 0-9 Units into the skin 3 (three) times daily with meals. Sliding scale CBG 70 - 120: 0 units CBG 121 - 150: 1 unit,  CBG 151 - 200: 2 units,  CBG 201 - 250: 3 units,  CBG 251 - 300: 5 units,  CBG 301 - 350: 7 units,  CBG 351 - 400: 9 units   CBG > 400: 9 units and notify your MD 10 mL 11  . insulin glargine (LANTUS) 100 UNIT/ML injection Inject 0.05 mLs (5 Units total) into the skin at bedtime. 10 mL 11  . ketoconazole (NIZORAL) 2 % cream Apply 1 application topically daily.    Marland Kitchen lidocaine-prilocaine (EMLA) cream Apply 1 application topically as needed (On Tues, Thu,and Sat.).    Marland Kitchen loperamide (IMODIUM A-D) 2 MG tablet Take 2 mg by mouth 4 (four) times daily as needed for diarrhea or loose stools.    . menthol-cetylpyridinium (CEPACOL) 3 MG lozenge Take 1 lozenge by mouth as needed for sore throat.    . multivitamin (RENA-VIT) TABS tablet Take 1 tablet by mouth at bedtime.  0  . Nutritional Supplements (FEEDING SUPPLEMENT, NEPRO CARB STEADY,) LIQD Take 237 mLs by mouth 2 (two) times daily between meals.  0  . ondansetron (ZOFRAN) 4 MG tablet Take 4 mg by mouth every 8 (eight) hours as needed for nausea or vomiting.    Marland Kitchen Propylene Glycol (SYSTANE BALANCE) 0.6 % SOLN Apply 1 drop to eye 2 (two) times daily.    Marland Kitchen rOPINIRole (REQUIP) 1 MG tablet Take 1 mg by mouth 2 (two) times daily.    . rosuvastatin (CRESTOR) 5 MG tablet Take 5 mg by mouth at bedtime. For CHOLESTEROL    . sevelamer carbonate (RENVELA) 800 MG tablet Take 1 tablet (800 mg total) by mouth 3 (three) times daily with meals.    Marland Kitchen amiodarone (PACERONE) 200 MG tablet Take 1 tablet (200 mg  total) by mouth daily. (Patient not taking: Reported on 05/28/2018)    . carvedilol (COREG) 6.25 MG tablet Take 1 tablet (6.25 mg total) by mouth 2 (two) times daily with a meal.    . enoxaparin (LOVENOX) 80 MG/0.8ML injection Inject 0.7 mLs (70 mg total) into the skin daily. Please continue until INR above 2 then stop (Patient not taking: Reported on 05/28/2018) 22.4 Syringe   . warfarin (COUMADIN)  4 MG tablet Take 1 tablet (4 mg total) by mouth daily at 6 PM. Please adjust dose according to PT/INR, continue for 3 months (Patient not taking: Reported on 05/28/2018)     No current facility-administered medications for this visit.     ROS:   General:  No weight loss, Fever, chills  HEENT: No recent headaches, no nasal bleeding, no visual changes, no sore throat  Neurologic: No dizziness, blackouts, seizures. No recent symptoms of stroke or mini- stroke. No recent episodes of slurred speech, or temporary blindness.  Cardiac: No recent episodes of chest pain/pressure, no shortness of breath at rest.  No shortness of breath with exertion.  + history of atrial fibrillation or irregular heartbeat  Vascular: No history of rest pain in feet.  No history of claudication.  No history of non-healing ulcer, No history of DVT   Pulmonary: No home oxygen, no productive cough, no hemoptysis,  No asthma or wheezing  Musculoskeletal:  [x ] Arthritis, [ ]  Low back pain,  [ ]  Joint pain  Hematologic:No history of hypercoagulable state.  No history of easy bleeding.  No history of anemia  Gastrointestinal: No hematochezia or melena,  No gastroesophageal reflux, no trouble swallowing  Urinary: [ ]  chronic Kidney disease, [x ] on HD - [ ]  MWF or [x ] TTHS, [ ]  Burning with urination, [ ]  Frequent urination, [ ]  Difficulty urinating;   Skin: No rashes  Psychological: No history of anxiety,  No history of depression   Physical Examination     Vitals:   05/28/18 0826  BP: (!)  123/59  Pulse: 69  Resp: 16  Temp: 98.1 F (36.7 C)  TempSrc: Oral  SpO2: 99%  Weight: 176 lb (79.8 kg)  Height: 5\' 6"  (1.676 m)    Body mass index is 28.41 kg/m.  General:  Alert and oriented, no acute distress HEENT: Normal, normocephalic Neck: No bruit or JVD Pulmonary: Clear to auscultation bilaterally Cardiac: Regular Rate and Rhythm without murmur Gastrointestinal: Soft, non-tender, non-distended, no mass, no scars Skin: No rash Extremity Pulses:   radial, brachial pulses bilaterally.  Palpable pulse and weaker thrill in left AV fistula Musculoskeletal: No deformity or edema      Neurologic: Upper and lower extremity motor 5/5 and symmetric  DATA:  Duplex 04/15/2018 +------------+----------+-------------+----------+--------------+ OUTFLOW VEINPSV (cm/s)Diameter (cm)Depth (cm) Describe  +------------+----------+-------------+----------+--------------+ Prox UA  70  0.82  1.11 mural thrombus +------------+----------+-------------+----------+--------------+ Mid UA  50  1.88  0.34 mural thrombus +------------+----------+-------------+----------+--------------+ Dist UA  605  0.55  0.22   +------------+----------+-------------+----------+--------------+ AC Fossa  189  0.84  0.22   +------------+----------+-------------+----------+--------------+    ASSESSMENT:  Poorly functioning left AV fistula  PLAN: Flow changes in the out flow with a drop off in velocity  I have scheduled her for fistulogram.  She will have to hold her Coumadin. HD TTS.    Roxy Horseman PA-C [facility] [place] Office: [phone]  MD in clinic [place]

## 2018-06-02 NOTE — Op Note (Signed)
OPERATIVE NOTE   PROCEDURE: 1. Left arm fistulogram including central venogram 2. Left cephalic vein distal arm venoplasty (6 mm x 40 mm Mustang and 7 mm x 40 mm Lutonix) and upper arm cepahlic vein venoplasty (7 mm x 40 mm Lutonix)  PRE-OPERATIVE DIAGNOSIS: Malfunctioning left brachiocephalic arteriovenous fistula  POST-OPERATIVE DIAGNOSIS: same as above   SURGEON: [name], MD  ANESTHESIA: local  ESTIMATED BLOOD LOSS: 5 cc  FINDING(S): The cephalic vein in the distal arm just past the arterial anastomosis had an approximate 60 to 70% stenosis that was angioplastied with a 6 mm Mustang and then 7 mm Lutonix with 0% residual stenosis.  There was a second lesion in the upper arm cephalic vein where mural thrombus was identified on duplex ultrasound and this was angioplastied with a 7 mm Lutonix with no evidence of residual thrombus.  There was a great thrill in the fistula at the completion of the case.  SPECIMEN(S):  None  CONTRAST: 60 cc  INDICATIONS: [name] is a 85 y.o. female who  presents with malfunctioning left brachiocepahlic arteriovenous fistula.  The patient is scheduled for left arm fistulogram.  The patient is aware the risks include but are not limited to: bleeding, infection, thrombosis of the cannulated access, and possible anaphylactic reaction to the contrast.  The patient is aware of the risks of the procedure and elects to proceed forward.  DESCRIPTION: After full informed written consent was obtained, the patient was brought back to the angiography suite and placed supine upon the angiography table.  The patient was connected to monitoring equipment.  The left arm was prepped and draped in the standard fashion for a left arm fistulogram.  The left brachiocepahlic arteriovenous fistula was cannulated with a micropuncture needle.  The microwire was advanced into the fistula and the needle was exchanged for the a microsheath, which was lodged 2  cm into the access.  The wire was removed and the sheath was connected to the IV extension tubing.  Hand injections were completed to image the access from the antecubitum up to the level of axilla.  The central venous structures were also imaged by hand injections.  We identified a focal 60-70% stenosis in the cephalic vein in the distal arm and a second lesion in the upper arm cephalic vein where there was mural thrombus.  At that point in time we inserted a Bentson wire and exchanged for a short 6 [place] sheath.  Patient was given 3000 units of IV heparin.  The distal arm cephalic vein stenosis was then angioplastied with a 6 mm x 40 mm Mustang and then a 7 mm x 40 mm drug coated Lutonix with 0% residual stenosis.  I then took the Lutonix balloon to the upper arm and angioplastied the segment of cephalic vein where mural thrombus was identified on ultrasound.  There was no residual thrombus at completion of this portion of the procedure.  There was a great thrill in the fistula.  At that point in time the sheath was removed after a pursestring suture was placed with a 4-0 Monocryl.  There was a little hematoma around her access site from some bleeding around the sheath but manual pressure was held for 10 minutes and this significantly improved with no pain and a great thrill at the completion of the case.  COMPLICATIONS: None  CONDITION: Stable  [name], MD [facility] [place] Office: [phone] Pager: [phone]  06/02/2018 10:00 AM

## 2018-06-02 NOTE — Discharge Instructions (Signed)

## 2018-06-03 ENCOUNTER — Encounter (HOSPITAL_COMMUNITY): Payer: Self-pay | Admitting: Vascular Surgery

## 2018-09-28 ENCOUNTER — Other Ambulatory Visit: Payer: Self-pay

## 2018-09-28 ENCOUNTER — Ambulatory Visit (INDEPENDENT_AMBULATORY_CARE_PROVIDER_SITE_OTHER): Payer: Medicare (Managed Care) | Admitting: Vascular Surgery

## 2018-09-28 VITALS — BP 119/67 | HR 76 | Temp 98.3°F | Resp 16 | Ht 64.0 in | Wt 169.0 lb

## 2018-09-28 DIAGNOSIS — Z992 Dependence on renal dialysis: Secondary | ICD-10-CM

## 2018-09-28 DIAGNOSIS — N186 End stage renal disease: Secondary | ICD-10-CM

## 2018-09-28 NOTE — Progress Notes (Signed)
Patient name: [name] MRN: [id number] DOB: 13-Nov-1930 Sex: female  REASON FOR VISIT: Pain and swelling in left arm after remote fistulogram in March  HPI: [name] is a 85 y.o. female with multiple medical problems including end-stage renal disease and currently dialyzes via left arm brachiocephalic fistula that has been referred here for evaluation of swelling in her left arm as well as pain.  Patient states the fistula "has not been running right."  She states she has one particular tech that has trouble accessing her fistula.  She last underwent cephalic vein venoplasty and fistulogram with me on 06/02/2018.  In addition to the fistula not running right patient states she also has pain in her index and middle finger in the left hand.  She states this has been ongoing for years.  No tissue loss in the right hand.  Past Medical History:  Diagnosis Date  . Absent pedal pulses   . Anemia of chronic renal failure 10/05/2010   Nice response to Procrit   . Anemia, iron deficiency 08/18/2012  . Ataxia involving legs    has had several falls  . CAD (coronary artery disease), native coronary artery 07/05/2016   3VD by cath 2008  01/22/07 CABG with LIMA to LAD< SVG to dx, SVG to OM1-2, SVG to PDA Dr. Roxan Hockey  . Chronic diastolic heart failure ([place]) 05/23/2014  . Dementia ([place])    "mild" per MD note  . Diabetes mellitus   . ESRD on hemodialysis ([place])   . Family history of anesthesia complication    Nephew- had time waking  . Hyperlipidemia   . Hypertensive heart disease without CHF 11/10/2013  . Neuropathy   . Osteoarthritis 01/01/2013  . Type 2 diabetes mellitus with renal manifestations ([place]) 02/01/2014    Past Surgical History:  Procedure Laterality Date  . A/V FISTULAGRAM Left 06/02/2018   Procedure: A/V FISTULAGRAM;  Surgeon: [name], MD;  Location: [facility];  Service: Cardiovascular;  Laterality: Left;  . CATARACT EXTRACTION     cataract  .  CORONARY ARTERY BYPASS GRAFT  2008  . EYE SURGERY    . FEMUR FRACTURE SURGERY    . FEMUR IM NAIL Left 07/05/2016   Procedure: INTRAMEDULLARY (IM) RETROGRADE FEMORAL NAILING LEFT;  Surgeon: Marybelle Killings, MD;  Location: [facility];  Service: Orthopedics;  Laterality: Left;  . INSERTION OF DIALYSIS CATHETER Left 09/05/2013   Procedure: INSERTION OF DIALYSIS CATHETER;  Surgeon: Conrad Montreat, MD;  Location: [facility];  Service: Vascular;  Laterality: Left;  . IR DIALY SHUNT INTRO Echo W/IMG LEFT Left 09/15/2016  . IR DIALY SHUNT INTRO NEEDLE/INTRACATH INITIAL W/IMG LEFT Left 04/08/2017  . IR DIALY SHUNT INTRO NEEDLE/INTRACATH INITIAL W/IMG LEFT Left 03/09/2018  . IR US GUIDE VASC ACCESS LEFT  03/09/2018  . PERIPHERAL VASCULAR INTERVENTION Left 06/02/2018   Procedure: PERIPHERAL VASCULAR INTERVENTION;  Surgeon: [name], MD;  Location: [facility];  Service: Cardiovascular;  Laterality: Left;    Family History  Problem Relation Age of Onset  . Diabetes Father   . Cancer Sister   . Aneurysm Mother     SOCIAL HISTORY: Social History   Tobacco Use  . Smoking status: Never Smoker  . Smokeless tobacco: Never Used  Substance Use Topics  . Alcohol use: No    No Known Allergies  Current Outpatient Medications  Medication Sig Dispense Refill  . acetaminophen (TYLENOL) 325 MG tablet Take 650 mg by mouth  3 (three) times daily as needed for moderate pain.    Marland Kitchen aspirin 81 MG chewable tablet Chew 81 mg by mouth daily.     Marland Kitchen donepezil (ARICEPT) 10 MG tablet Take 5 mg by mouth at bedtime. For MEMORY    . Emollient (EUCERIN EX) Apply 1 application topically 2 (two) times daily.    Marland Kitchen Emollient (MINERIN) LOTN Apply 1 application topically 4 (four) times daily as needed (dry skin).    . fluticasone (FLONASE) 50 MCG/ACT nasal spray Place 1 spray into both nostrils daily.    . folic acid-vitamin b complex-vitamin c-selenium-zinc (DIALYVITE) 3 MG TABS tablet Take 1 tablet by mouth  daily.    Marland Kitchen glipiZIDE (GLUCOTROL XL) 2.5 MG 24 hr tablet Take 2.5 mg by mouth daily.    . Lidocaine 4 % PTCH Apply 1 patch topically daily. Apply to left knee    . lidocaine-prilocaine (EMLA) cream Apply 1 application topically as needed (port access).     Marland Kitchen loperamide (IMODIUM A-D) 2 MG tablet Take 2 mg by mouth daily as needed for diarrhea or loose stools.     . midodrine (PROAMATINE) 5 MG tablet Take 5 mg by mouth daily.    . Nutritional Supplements (FEEDING SUPPLEMENT, NEPRO CARB STEADY,) LIQD Take 237 mLs by mouth 2 (two) times daily between meals.  0  . pantoprazole (PROTONIX) 40 MG tablet Take 40 mg by mouth daily.    Marland Kitchen Propylene Glycol (SYSTANE BALANCE) 0.6 % SOLN Place 1 drop into both eyes 2 (two) times daily.     . repaglinide (PRANDIN) 1 MG tablet Take 0.5 mg by mouth 3 (three) times daily before meals.    . rosuvastatin (CRESTOR) 5 MG tablet Take 5 mg by mouth at bedtime. For CHOLESTEROL    . sevelamer carbonate (RENVELA) 0.8 g PACK packet Take 0.8 g by mouth 3 (three) times daily with meals.     No current facility-administered medications for this visit.     REVIEW OF SYSTEMS:  [X]  denotes positive finding, [ ]  denotes negative finding Cardiac  Comments:  Chest pain or chest pressure:    Shortness of breath upon exertion:    Short of breath when lying flat:    Irregular heart rhythm:        Vascular    Pain in calf, thigh, or hip brought on by ambulation:    Pain in feet at night that wakes you up from your sleep:     Blood clot in your veins:    Leg swelling:         Pulmonary    Oxygen at home:    Productive cough:     Wheezing:         Neurologic    Sudden weakness in arms or legs:     Sudden numbness in arms or legs:     Sudden onset of difficulty speaking or slurred speech:    Temporary loss of vision in one eye:     Problems with dizziness:         Gastrointestinal    Blood in stool:     Vomited blood:         Genitourinary    Burning when  urinating:     Blood in urine:        Psychiatric    Major depression:         Hematologic    Bleeding problems:    Problems with blood clotting too easily:  Skin    Rashes or ulcers:        Constitutional    Fever or chills:      PHYSICAL EXAM: Vitals:   09/28/18 1415  BP: 119/67  Pulse: 76  Resp: 16  Temp: 98.3 F (36.8 C)  TempSrc: Temporal  SpO2: (!) 1%  Weight: 169 lb (76.7 kg)  Height: 5\' 4"  (1.626 m)    GENERAL: The patient is a well-nourished female, in no acute distress. The vital signs are documented above. CARDIAC: There is a regular rate and rhythm.  VASCULAR:  Excellent thrill in left arm brachiocephalic AVF Unable to palpable radial pulse at wrist Brisk radial signal at wrist, monophasic ulnar signal - augments with graft compression Palmar arch signal PULMONARY: There is good air exchange bilaterally without wheezing or rales. ABDOMEN: Soft and non-tender with normal pitched bowel sounds.  MUSCULOSKELETAL: There are no major deformities or cyanosis. NEUROLOGIC: No focal weakness or paresthesias are detected.   DATA:   None  Assessment/Plan:  85 year old female referred back for evaluation of her left arm AV fistula.  The referral note in the chart indicates left arm swelling and pain.  Patient states the fistula has not been running right.  She is already scheduled for a fistulogram with [facility] tomorrow which would be the correct intervention to evaluate for recurrent cephalic vein stenosis.  I think she has a separate problem with her finger pain.  This could be a component of steal syndrome.  When I compress her fistula her signals at the wrist certainly augment by Doppler.  Discussed in detail that the next step would be potentially fistula ligation vs banding.  Patient wants to avoid any additional surgery right now and feels the symptoms are manageable.     [name], MD [facility]  Office: [phone] Pager: [phone]

## 2018-10-15 ENCOUNTER — Encounter (HOSPITAL_COMMUNITY): Payer: Self-pay | Admitting: *Deleted

## 2018-10-15 ENCOUNTER — Other Ambulatory Visit: Payer: Self-pay

## 2018-10-15 ENCOUNTER — Other Ambulatory Visit: Payer: Self-pay | Admitting: *Deleted

## 2018-10-15 NOTE — Progress Notes (Addendum)
Spoke with pt for pre-op call. Pt has hx of CAD with CABG in 2008. Pt denies any recent chest pain or sob. Pt denies CHF hx. Pt is a type 2 diabetic. She states she does not check her blood sugar at home. I have called and left a message for the nurse at the [facility] to find out pt's most recent A1C. Pt gets her medications through [facility] and it comes in a daily packet. She states she doesn't know which pills are what. I instructed her not to take any the morning of surgery. She voiced understanding. Pt will be arriving via [facility]'s transportation. Her nephew or her caretaker will be with her at home after surgery. I have also called [facility] and spoke with Daeja, RN to find out if pt sees a cardiologist or if Dr. Bradd Burner at [facility] is now taking care of her cardiac issues. She states she will find out and let me know. She did have an A1C of 6.7 in May.   Pt will need Covid 19 testing on arrival Monday AM.   Coronavirus Screening  Have you experienced the following symptoms:  Cough NO Fever (>100.64F)  NO Runny nose NO Sore throat NO Difficulty breathing/shortness of breath  NO  Have you or a family member traveled in the last 14 days and where? NO   Patient reminded that hospital visitation restrictions are in effect and the importance of the restrictions.

## 2018-10-15 NOTE — Progress Notes (Signed)
Daeja, RN from [facility] called me back and stated pt no longer sees a cardiologist, her PCP takes care of her cardiac needs.

## 2018-10-16 ENCOUNTER — Encounter (HOSPITAL_COMMUNITY): Payer: Self-pay | Admitting: *Deleted

## 2018-10-16 ENCOUNTER — Other Ambulatory Visit: Payer: Self-pay

## 2018-10-16 ENCOUNTER — Inpatient Hospital Stay (HOSPITAL_COMMUNITY)
Admission: EM | Admit: 2018-10-16 | Discharge: 2018-10-30 | DRG: 177 | Disposition: E | Payer: Medicare (Managed Care) | Attending: Internal Medicine | Admitting: Internal Medicine

## 2018-10-16 ENCOUNTER — Emergency Department (HOSPITAL_COMMUNITY): Payer: Medicare (Managed Care)

## 2018-10-16 DIAGNOSIS — Z7951 Long term (current) use of inhaled steroids: Secondary | ICD-10-CM

## 2018-10-16 DIAGNOSIS — U071 COVID-19: Secondary | ICD-10-CM | POA: Diagnosis not present

## 2018-10-16 DIAGNOSIS — I2510 Atherosclerotic heart disease of native coronary artery without angina pectoris: Secondary | ICD-10-CM | POA: Diagnosis present

## 2018-10-16 DIAGNOSIS — E1140 Type 2 diabetes mellitus with diabetic neuropathy, unspecified: Secondary | ICD-10-CM | POA: Diagnosis present

## 2018-10-16 DIAGNOSIS — J069 Acute upper respiratory infection, unspecified: Secondary | ICD-10-CM | POA: Diagnosis present

## 2018-10-16 DIAGNOSIS — Z951 Presence of aortocoronary bypass graft: Secondary | ICD-10-CM

## 2018-10-16 DIAGNOSIS — J9691 Respiratory failure, unspecified with hypoxia: Secondary | ICD-10-CM | POA: Diagnosis present

## 2018-10-16 DIAGNOSIS — F419 Anxiety disorder, unspecified: Secondary | ICD-10-CM | POA: Diagnosis not present

## 2018-10-16 DIAGNOSIS — E1122 Type 2 diabetes mellitus with diabetic chronic kidney disease: Secondary | ICD-10-CM | POA: Diagnosis present

## 2018-10-16 DIAGNOSIS — J1289 Other viral pneumonia: Secondary | ICD-10-CM | POA: Diagnosis present

## 2018-10-16 DIAGNOSIS — I132 Hypertensive heart and chronic kidney disease with heart failure and with stage 5 chronic kidney disease, or end stage renal disease: Secondary | ICD-10-CM | POA: Diagnosis present

## 2018-10-16 DIAGNOSIS — E1136 Type 2 diabetes mellitus with diabetic cataract: Secondary | ICD-10-CM | POA: Diagnosis present

## 2018-10-16 DIAGNOSIS — F0390 Unspecified dementia without behavioral disturbance: Secondary | ICD-10-CM | POA: Diagnosis present

## 2018-10-16 DIAGNOSIS — D631 Anemia in chronic kidney disease: Secondary | ICD-10-CM | POA: Diagnosis present

## 2018-10-16 DIAGNOSIS — N186 End stage renal disease: Secondary | ICD-10-CM

## 2018-10-16 DIAGNOSIS — E785 Hyperlipidemia, unspecified: Secondary | ICD-10-CM | POA: Diagnosis present

## 2018-10-16 DIAGNOSIS — J1282 Pneumonia due to coronavirus disease 2019: Secondary | ICD-10-CM | POA: Diagnosis present

## 2018-10-16 DIAGNOSIS — M1990 Unspecified osteoarthritis, unspecified site: Secondary | ICD-10-CM | POA: Diagnosis present

## 2018-10-16 DIAGNOSIS — E1129 Type 2 diabetes mellitus with other diabetic kidney complication: Secondary | ICD-10-CM | POA: Diagnosis present

## 2018-10-16 DIAGNOSIS — M898X9 Other specified disorders of bone, unspecified site: Secondary | ICD-10-CM | POA: Diagnosis present

## 2018-10-16 DIAGNOSIS — Z809 Family history of malignant neoplasm, unspecified: Secondary | ICD-10-CM

## 2018-10-16 DIAGNOSIS — I249 Acute ischemic heart disease, unspecified: Secondary | ICD-10-CM

## 2018-10-16 DIAGNOSIS — Z7982 Long term (current) use of aspirin: Secondary | ICD-10-CM

## 2018-10-16 DIAGNOSIS — Z66 Do not resuscitate: Secondary | ICD-10-CM | POA: Diagnosis not present

## 2018-10-16 DIAGNOSIS — I5032 Chronic diastolic (congestive) heart failure: Secondary | ICD-10-CM | POA: Diagnosis present

## 2018-10-16 DIAGNOSIS — Z515 Encounter for palliative care: Secondary | ICD-10-CM | POA: Diagnosis not present

## 2018-10-16 DIAGNOSIS — R0603 Acute respiratory distress: Secondary | ICD-10-CM

## 2018-10-16 DIAGNOSIS — Z833 Family history of diabetes mellitus: Secondary | ICD-10-CM

## 2018-10-16 DIAGNOSIS — J9601 Acute respiratory failure with hypoxia: Secondary | ICD-10-CM | POA: Diagnosis present

## 2018-10-16 DIAGNOSIS — I248 Other forms of acute ischemic heart disease: Secondary | ICD-10-CM | POA: Diagnosis present

## 2018-10-16 DIAGNOSIS — T380X5A Adverse effect of glucocorticoids and synthetic analogues, initial encounter: Secondary | ICD-10-CM | POA: Diagnosis not present

## 2018-10-16 DIAGNOSIS — Z7984 Long term (current) use of oral hypoglycemic drugs: Secondary | ICD-10-CM

## 2018-10-16 LAB — CBC WITH DIFFERENTIAL/PLATELET
Abs Immature Granulocytes: 0.03 10*3/uL (ref 0.00–0.07)
Basophils Absolute: 0 10*3/uL (ref 0.0–0.1)
Basophils Relative: 0 %
Eosinophils Absolute: 0 10*3/uL (ref 0.0–0.5)
Eosinophils Relative: 0 %
HCT: 29.3 % — ABNORMAL LOW (ref 36.0–46.0)
Hemoglobin: 9.1 g/dL — ABNORMAL LOW (ref 12.0–15.0)
Immature Granulocytes: 0 %
Lymphocytes Relative: 4 %
Lymphs Abs: 0.3 10*3/uL — ABNORMAL LOW (ref 0.7–4.0)
MCH: 29.8 pg (ref 26.0–34.0)
MCHC: 31.1 g/dL (ref 30.0–36.0)
MCV: 96.1 fL (ref 80.0–100.0)
Monocytes Absolute: 0.4 10*3/uL (ref 0.1–1.0)
Monocytes Relative: 5 %
Neutro Abs: 6.1 10*3/uL (ref 1.7–7.7)
Neutrophils Relative %: 91 %
Platelets: 144 10*3/uL — ABNORMAL LOW (ref 150–400)
RBC: 3.05 MIL/uL — ABNORMAL LOW (ref 3.87–5.11)
RDW: 17.2 % — ABNORMAL HIGH (ref 11.5–15.5)
WBC: 6.8 10*3/uL (ref 4.0–10.5)
nRBC: 0 % (ref 0.0–0.2)

## 2018-10-16 LAB — COMPREHENSIVE METABOLIC PANEL WITH GFR
ALT: 19 U/L (ref 0–44)
AST: 30 U/L (ref 15–41)
Albumin: 2.7 g/dL — ABNORMAL LOW (ref 3.5–5.0)
Alkaline Phosphatase: 99 U/L (ref 38–126)
Anion gap: 14 (ref 5–15)
BUN: 34 mg/dL — ABNORMAL HIGH (ref 8–23)
CO2: 26 mmol/L (ref 22–32)
Calcium: 8.1 mg/dL — ABNORMAL LOW (ref 8.9–10.3)
Chloride: 92 mmol/L — ABNORMAL LOW (ref 98–111)
Creatinine, Ser: 4.27 mg/dL — ABNORMAL HIGH (ref 0.44–1.00)
GFR calc Af Amer: 10 mL/min — ABNORMAL LOW
GFR calc non Af Amer: 9 mL/min — ABNORMAL LOW
Glucose, Bld: 569 mg/dL (ref 70–99)
Potassium: 3.6 mmol/L (ref 3.5–5.1)
Sodium: 132 mmol/L — ABNORMAL LOW (ref 135–145)
Total Bilirubin: 1 mg/dL (ref 0.3–1.2)
Total Protein: 6.7 g/dL (ref 6.5–8.1)

## 2018-10-16 LAB — LIPASE, BLOOD: Lipase: 157 U/L — ABNORMAL HIGH (ref 11–51)

## 2018-10-16 LAB — LACTIC ACID, PLASMA: Lactic Acid, Venous: 3 mmol/L (ref 0.5–1.9)

## 2018-10-16 LAB — APTT: aPTT: 43 s — ABNORMAL HIGH (ref 24–36)

## 2018-10-16 LAB — PROTIME-INR
INR: 1.1 (ref 0.8–1.2)
Prothrombin Time: 13.8 s (ref 11.4–15.2)

## 2018-10-16 LAB — TROPONIN I (HIGH SENSITIVITY): Troponin I (High Sensitivity): 39 ng/L — ABNORMAL HIGH

## 2018-10-16 LAB — BRAIN NATRIURETIC PEPTIDE: B Natriuretic Peptide: 301 pg/mL — ABNORMAL HIGH (ref 0.0–100.0)

## 2018-10-16 MED ORDER — SODIUM CHLORIDE 0.9 % IV BOLUS: 500.0000 mL | Freq: Once | INTRAVENOUS | Status: DC

## 2018-10-16 MED ORDER — METRONIDAZOLE IN NACL 5-0.79 MG/ML-% IV SOLN
500.0000 mg | Freq: Once | INTRAVENOUS | Status: AC
  Administered 2018-10-17: 500 mg via INTRAVENOUS
  Filled 2018-10-16: qty 100

## 2018-10-16 MED ORDER — INSULIN ASPART 100 UNIT/ML ~~LOC~~ SOLN
10.0000 [IU] | Freq: Once | SUBCUTANEOUS | Status: AC
  Administered 2018-10-17: 10 [IU] via SUBCUTANEOUS
  Filled 2018-10-16: qty 1

## 2018-10-16 MED ORDER — SODIUM CHLORIDE 0.9 % IV SOLN
2.0000 g | Freq: Once | INTRAVENOUS | Status: AC
  Administered 2018-10-16: 2 g via INTRAVENOUS
  Filled 2018-10-16: qty 2

## 2018-10-16 MED ORDER — VANCOMYCIN HCL IN DEXTROSE 1-5 GM/200ML-% IV SOLN
1000.0000 mg | Freq: Once | INTRAVENOUS | Status: AC
  Administered 2018-10-17: 1000 mg via INTRAVENOUS
  Filled 2018-10-16: qty 200

## 2018-10-16 NOTE — ED Provider Notes (Signed)
[facility] EMERGENCY DEPARTMENT Provider Note   CSN: [id number] Arrival date & time: 09/29/2018  2230     History   Chief Complaint Chief Complaint  Patient presents with  . Shortness of Breath    HPI [name] is a 85 y.o. female.     Level 5 caveat due to the condition.  Patient came home by EMS with shortness of breath.  States she had a shortness of breath for the past several days but acutely worsened tonight.  She is found to be hypoxic on room air and placed on a nonrebreather.  She is saturating mid 90s on 2 L and tachypneic.  States she is dialysis Tuesday Thursday Saturday and did go today did not miss any sessions.  She makes minimal amount of urine.  Denies chest pain, cough, fever, nausea, vomiting, diarrhea.  No abdominal pain.  She states she normally does not have any breathing issues and does not wear oxygen at home.  No history of COPD or CHF.  Denies any sick contacts.  No leg swelling.  She is due to have surgery on her finger next week for chronic ischemia.  She reports no change in his chronic pain pattern.  He denies any headache, chills, fever, chest pain or abdominal pain.  She feels like she is breathing better with the oxygen on.  The history is provided by the patient and the EMS personnel. The history is limited by the condition of the patient.  Shortness of Breath Associated symptoms: no abdominal pain, no chest pain, no cough, no fever, no headaches, no rash and no vomiting     Past Medical History:  Diagnosis Date  . Absent pedal pulses   . Anemia of chronic renal failure 10/05/2010   Nice response to Procrit   . Anemia, iron deficiency 08/18/2012  . Ataxia involving legs    has had several falls  . CAD (coronary artery disease), native coronary artery 07/05/2016   3VD by cath 2008  01/22/07 CABG with LIMA to LAD< SVG to dx, SVG to OM1-2, SVG to PDA Dr. Roxan Hockey  . Chronic diastolic heart failure ([place]) 05/23/2014  . Coronary artery disease    . Dementia ([place])    "mild" per MD note  . Diabetes mellitus   . Dyspnea   . ESRD on hemodialysis ([place])   . Family history of anesthesia complication    Nephew- had time waking  . Hyperlipidemia   . Hypertension   . Hypertensive heart disease without CHF 11/10/2013  . Neuropathy   . Osteoarthritis 01/01/2013  . Type 2 diabetes mellitus with renal manifestations ([place]) 02/01/2014    Patient Active Problem List   Diagnosis Date Noted  . Displaced supracondylar fracture with intracondylar extension of lower end of right femur, initial encounter for closed fracture ([place]) 07/05/2016  . CAD (coronary artery disease), native coronary artery 07/05/2016  . Chest pain   . Closed fracture of left distal femur ([place])   . Closed nondisplaced fracture of fourth metatarsal bone of left foot   . Closed nondisplaced fracture of third metatarsal bone of left foot   . Multifocal atrial tachycardia (H[facility])   . Nondisplaced fracture of second metatarsal bone, left foot, initial encounter for closed fracture   . Closed displaced fracture of distal phalanx of left great toe   . ESRD on hemodialysis ([place])   . Chronic diastolic heart failure ([place]) 05/23/2014  . Type 2 diabetes mellitus with renal manifestations ([place]) 02/01/2014  .  Hypertensive heart disease without CHF 11/10/2013  . Dementia ([place]) 08/24/2013  . Frequent falls 08/24/2013  . Osteoarthritis 01/01/2013  . Hyperlipidemia 01/01/2013  . Hx of CABG 01/01/2013  . Anemia of chronic renal failure 10/05/2010    Past Surgical History:  Procedure Laterality Date  . A/V FISTULAGRAM Left 06/02/2018   Procedure: A/V FISTULAGRAM;  Surgeon: [name], MD;  Location: [place] [facility];  Service: Cardiovascular;  Laterality: Left;  . CATARACT EXTRACTION     cataract  . CORONARY ARTERY BYPASS GRAFT  2008  . EYE SURGERY    . FEMUR FRACTURE SURGERY    . FEMUR IM NAIL Left 07/05/2016   Procedure: INTRAMEDULLARY (IM) RETROGRADE FEMORAL NAILING LEFT;   Surgeon: Marybelle Killings, MD;  Location: [facility];  Service: Orthopedics;  Laterality: Left;  . INSERTION OF DIALYSIS CATHETER Left 09/05/2013   Procedure: INSERTION OF DIALYSIS CATHETER;  Surgeon: Conrad Annapolis, MD;  Location: [facility];  Service: Vascular;  Laterality: Left;  . IR DIALY SHUNT INTRO [place] W/IMG LEFT Left 09/15/2016  . IR DIALY SHUNT INTRO NEEDLE/INTRACATH INITIAL W/IMG LEFT Left 04/08/2017  . IR DIALY SHUNT INTRO NEEDLE/INTRACATH INITIAL W/IMG LEFT Left 03/09/2018  . IR US GUIDE VASC ACCESS LEFT  03/09/2018  . PERIPHERAL VASCULAR INTERVENTION Left 06/02/2018   Procedure: PERIPHERAL VASCULAR INTERVENTION;  Surgeon: [name], MD;  Location: [place] [facility];  Service: Cardiovascular;  Laterality: Left;     OB History   No obstetric history on file.      Home Medications    Prior to Admission medications   Medication Sig Start Date End Date Taking? Authorizing Provider  acetaminophen (TYLENOL) 650 MG CR tablet Take 1,300 mg by mouth 2 (two) times a day.    [provider]  antiseptic oral rinse (BIOTENE) LIQD 15 mLs by Mouth Rinse route 6 (six) times daily.    [provider]  aspirin 81 MG chewable tablet Chew 81 mg by mouth daily.     [provider]  donepezil (ARICEPT) 10 MG tablet Take 10 mg by mouth at bedtime. For MEMORY     [provider]  doxycycline (VIBRAMYCIN) 100 MG capsule Take 100 mg by mouth 2 (two) times daily. For 7 days - completes on 10/21/2018    [provider]  Emollient [facility]) LOTN Apply 1 application topically 4 (four) times daily as needed (dry skin).    [provider]  fluticasone (FLONASE) 50 MCG/ACT nasal spray Place 1 spray into both nostrils daily.    [provider]  folic acid-vitamin b complex-vitamin c-selenium-zinc (DIALYVITE) 3 MG TABS tablet Take 1 tablet by mouth daily.    [provider]  glipiZIDE (GLUCOTROL XL) 2.5 MG 24 hr tablet Take 2.5 mg  by mouth daily.    [provider]  Lidocaine 4 % PTCH Apply 1 patch topically daily. Apply to left knee    [provider]  lidocaine-prilocaine (EMLA) cream Apply 1 application topically as needed (port access).     [provider]  loperamide (IMODIUM A-D) 2 MG tablet Take 2 mg by mouth daily as needed for diarrhea or loose stools.     [provider]  Melatonin ER 5 MG TBCR Take 5 mg by mouth at bedtime.    [provider]  midodrine (PROAMATINE) 5 MG tablet Take 5 mg by mouth daily.    [provider]  Nutritional Supplements (FEEDING SUPPLEMENT, NEPRO CARB STEADY,) LIQD Take  237 mLs by mouth 2 (two) times daily between meals. 07/09/16   Rai, Ripudeep K, MD  pantoprazole (PROTONIX) 40 MG tablet Take 40 mg by mouth daily.    [provider]  Propylene Glycol (SYSTANE BALANCE) 0.6 % SOLN Place 1 drop into both eyes 2 (two) times daily.     [provider]  repaglinide (PRANDIN) 1 MG tablet Take 0.5 mg by mouth 3 (three) times daily before meals.    [provider]  rosuvastatin (CRESTOR) 5 MG tablet Take 5 mg by mouth at bedtime. For CHOLESTEROL    [provider]  sevelamer carbonate (RENVELA) 0.8 g PACK packet Take 0.8 g by mouth 3 (three) times daily with meals.    [provider]    Family History Family History  Problem Relation Age of Onset  . Diabetes Father   . Cancer Sister   . Aneurysm Mother     Social History Social History   Tobacco Use  . Smoking status: Never Smoker  . Smokeless tobacco: Never Used  Substance Use Topics  . Alcohol use: Not Currently  . Drug use: No     Allergies   Patient has no known allergies.   Review of Systems Review of Systems  Constitutional: Positive for activity change and appetite change. Negative for fatigue and fever.  HENT: Negative for congestion and rhinorrhea.   Eyes: Negative for visual disturbance.  Respiratory: Positive for  shortness of breath. Negative for cough and chest tightness.   Cardiovascular: Negative for chest pain.  Gastrointestinal: Negative for abdominal pain, nausea and vomiting.  Genitourinary: Negative for dysuria and hematuria.  Musculoskeletal: Positive for arthralgias and myalgias.  Skin: Negative for rash.  Neurological: Positive for weakness. Negative for dizziness and headaches.   all other systems are negative except as noted in the HPI and PMH.     Physical Exam Updated Vital Signs BP 109/61   Pulse (!) 101   Temp 99.1 F (37.3 C)   Resp (!) 33   Ht 5\' 4"  (1.626 m)   Wt 76.6 kg   SpO2 92%   BMI 28.99 kg/m   Physical Exam Vitals signs and nursing note reviewed.  Constitutional:      General: She is in acute distress.     Appearance: She is well-developed. She is obese. She is ill-appearing.     Comments: Mild increased work of breathing, diminished breath sounds at the bases, tachypneic to the 30s  HENT:     Head: Normocephalic and atraumatic.     Mouth/Throat:     Pharynx: No oropharyngeal exudate.  Eyes:     Conjunctiva/sclera: Conjunctivae normal.     Pupils: Pupils are equal, round, and reactive to light.     Comments: Opacification of left cornea  Neck:     Musculoskeletal: Normal range of motion and neck supple.     Comments: No meningismus. Cardiovascular:     Rate and Rhythm: Normal rate and regular rhythm.     Heart sounds: Normal heart sounds. No murmur.  Pulmonary:     Effort: Pulmonary effort is normal. No respiratory distress.     Breath sounds: Rhonchi and rales present.     Comments: Mildly increased work of breathing, speaking in short phrases, diminished breath sounds at the bases Abdominal:     Palpations: Abdomen is soft.     Tenderness: There is no abdominal tenderness. There is no guarding or rebound.  Musculoskeletal: Normal range of motion.  General: No tenderness.     Comments: Dialysis fistula LUE with thrill and bruit   Induration to left middle finger and palmar surface  Skin:    General: Skin is warm.     Capillary Refill: Capillary refill takes less than 2 seconds.  Neurological:     General: No focal deficit present.     Mental Status: She is alert and oriented to person, place, and time. Mental status is at baseline.     Cranial Nerves: No cranial nerve deficit.     Motor: No abnormal muscle tone.     Coordination: Coordination normal.     Comments: No ataxia on finger to nose bilaterally. No pronator drift. 5/5 strength throughout. CN 2-12 intact.Equal grip strength. Sensation intact.   Psychiatric:        Behavior: Behavior normal.      ED Treatments / Results  Labs (all labs ordered are listed, but only abnormal results are displayed) Labs Reviewed  SARS CORONAVIRUS 2 ([place] [place] [facility]) - Abnormal; Notable for the following components:      Result Value   SARS Coronavirus 2 POSITIVE (*)    All other components within normal limits  CBC WITH DIFFERENTIAL/PLATELET - Abnormal; Notable for the following components:   RBC 3.05 (*)    Hemoglobin 9.1 (*)    HCT 29.3 (*)    RDW 17.2 (*)    Platelets 144 (*)    Lymphs Abs 0.3 (*)    All other components within normal limits  COMPREHENSIVE METABOLIC PANEL - Abnormal; Notable for the following components:   Sodium 132 (*)    Chloride 92 (*)    Glucose, Bld 569 (*)    BUN 34 (*)    Creatinine, Ser 4.27 (*)    Calcium 8.1 (*)    Albumin 2.7 (*)    GFR calc non Af Amer 9 (*)    GFR calc Af Amer 10 (*)    All other components within normal limits  LACTIC ACID, PLASMA - Abnormal; Notable for the following components:   Lactic Acid, Venous 3.0 (*)    All other components within normal limits  LACTIC ACID, PLASMA - Abnormal; Notable for the following components:   Lactic Acid, Venous 2.4 (*)    All other components within normal limits  BRAIN NATRIURETIC PEPTIDE - Abnormal; Notable for the following  components:   B Natriuretic Peptide 301.0 (*)    All other components within normal limits  LIPASE, BLOOD - Abnormal; Notable for the following components:   Lipase 157 (*)    All other components within normal limits  BLOOD GAS, ARTERIAL - Abnormal; Notable for the following components:   pCO2 arterial 49.5 (*)    pO2, Arterial 57.1 (*)    Bicarbonate 28.3 (*)    Acid-Base Excess 5.1 (*)    Allens test (pass/fail) NOT INDICATED (*)    All other components within normal limits  APTT - Abnormal; Notable for the following components:   aPTT 43 (*)    All other components within normal limits  C-REACTIVE PROTEIN - Abnormal; Notable for the following components:   CRP 20.1 (*)    All other components within normal limits  D-DIMER, QUANTITATIVE (NOT AT [facility]) - Abnormal; Notable for the following components:   D-Dimer, Quant 1.12 (*)    All other components within normal limits  FERRITIN - Abnormal; Notable for the following components:   Ferritin 7,412 (*)  All other components within normal limits  FIBRINOGEN - Abnormal; Notable for the following components:   Fibrinogen 650 (*)    All other components within normal limits  LACTATE DEHYDROGENASE - Abnormal; Notable for the following components:   LDH 209 (*)    All other components within normal limits  SEDIMENTATION RATE - Abnormal; Notable for the following components:   Sed Rate 120 (*)    All other components within normal limits  CBG MONITORING, ED - Abnormal; Notable for the following components:   Glucose-Capillary 327 (*)    All other components within normal limits  TROPONIN I (HIGH SENSITIVITY) - Abnormal; Notable for the following components:   Troponin I (High Sensitivity) 39.00 (*)    All other components within normal limits  TROPONIN I (HIGH SENSITIVITY) - Abnormal; Notable for the following components:   Troponin I (High Sensitivity) 93.00 (*)    All other components within normal limits  CULTURE, BLOOD  (ROUTINE X 2)  CULTURE, BLOOD (ROUTINE X 2)  URINE CULTURE  PROTIME-INR  PROCALCITONIN  URINALYSIS, ROUTINE W REFLEX MICROSCOPIC  ABO/RH    EKG EKG Interpretation  Date/Time:  Saturday October 16 2018 22:44:11 EDT Ventricular Rate:  96 PR Interval:    QRS Duration: 78 QT Interval:  364 QTC Calculation: 460 R Axis:   176 Text Interpretation:  Right and left arm electrode reversal, interpretation assumes no reversal Sinus or ectopic atrial rhythm Ventricular premature complex Right axis deviation Low voltage, precordial leads Nonspecific T abnormalities, lateral leads Confirmed by Noemi Chapel [phone]) on 10/17/2018 7:19:10 AM   Radiology Dg Chest Portable 1 View  Result Date: 10/27/2018 CLINICAL DATA:  Shortness of breath EXAM: PORTABLE CHEST 1 VIEW COMPARISON:  07/22/2017 FINDINGS: The lung volumes are low. The patient is status post prior median sternotomy. The heart size is enlarged. There are multifocal airspace opacities bilaterally, greatest within the left lower and left mid lung zone. There is no pneumothorax. No large pleural effusion. IMPRESSION: 1. Multifocal airspace opacities as detailed above which is nonspecific and can be seen in patients with multifocal pneumonia (viral or bacterial) or developing pulmonary edema. 2. Cardiomegaly. 3. Low lung volumes. Electronically Signed   By: Constance Holster M.D.   On: 10/23/2018 23:41    Procedures Procedures (including critical care time)  Medications Ordered in ED Medications  ceFEPIme (MAXIPIME) 2 g in sodium chloride 0.9 % 100 mL IVPB (has no administration in time range)  metroNIDAZOLE (FLAGYL) IVPB 500 mg (has no administration in time range)  vancomycin (VANCOCIN) IVPB 1000 mg/200 mL premix (has no administration in time range)     Initial Impression / Assessment and Plan / ED Course  I have reviewed the triage vital signs and the nursing notes.  Pertinent labs & imaging results that were available during my care of  the patient were reviewed by me and considered in my medical decision making (see chart for details).       Dialysis patient with shortness of breath and hypoxia.  Denies any missed dialysis sessions.  EKG shows nonspecific T wave changes.  Patient tachypneic but not febrile.  She is given antibiotics And code sepsis activated.  ESRD and maintaining BP so will hold on aggressive fluids at this time.  CXR with bilateral infiltrates concerning for COVID-19.  HCAP antibiotics given after cultures obtained. ABG with hypoxemia. WOB improved on nasal cannula though tachypneic. Hyperglycemia without DKA. Insulin given.  Patient states she is a full code and would want to  be intubated if necessary.  COVID-19 positive.  Denies chest pain. Breathing comfortably on nasal cannula.   Will need admission to [facility] given her dialysis needs and new O2 requirement.   Admission d/w Dr. Denton Brick.  CRITICAL CARE Performed by: Ezequiel Essex Total critical care time: 60 minutes Critical care time was exclusive of separately billable procedures and treating other patients. Critical care was necessary to treat or prevent imminent or life-threatening deterioration. Critical care was time spent personally by me on the following activities: development of treatment plan with patient and/or surrogate as well as nursing, discussions with consultants, evaluation of patient's response to treatment, examination of patient, obtaining history from patient or surrogate, ordering and performing treatments and interventions, ordering and review of laboratory studies, ordering and review of radiographic studies, pulse oximetry and re-evaluation of patient's condition.  [name] was evaluated in Emergency Department on 10/17/2018 for the symptoms described in the history of present illness. She was evaluated in the context of the global COVID-19 pandemic, which necessitated consideration that the patient might be at  risk for infection with the SARS-CoV-2 virus that causes COVID-19. Institutional protocols and algorithms that pertain to the evaluation of patients at risk for COVID-19 are in a state of rapid change based on information released by regulatory bodies including the CDC and federal and state organizations. These policies and algorithms were followed during the patient's care in the ED.   Final Clinical Impressions(s) / ED Diagnoses   Final diagnoses:  Pneumonia due to COVID-19 virus    ED Discharge Orders    None       Patricia Fargo, Annie Main, MD 10/17/18 [phone]

## 2018-10-16 NOTE — ED Notes (Signed)
CRITICAL VALUE ALERT  Critical Value:  Glucose 563  Date & Time Notied:  10/02/2018 2348  Provider Notified: Dr. Wyvonnia Dusky  Orders Received/Actions taken: see chart

## 2018-10-16 NOTE — ED Notes (Signed)
Date and time results received: 10/10/2018 2345 (use smartphrase ".now" to insert current time)  Test: lactic acid Critical Value: 3.0  Name of Provider Notified: Dr Wyvonnia Dusky  Orders Received? Or Actions Taken?:

## 2018-10-16 NOTE — ED Triage Notes (Signed)
Pt arrived to er by ems with c/o feeling bad for over a week, sob that started tonight, room air pulse ox upon ems arrival was 75%, pt placed on NRB, upon arrival to er oxygen was removed for pt to be moved over and room air pulse ox was 74%, pt placed on oxygen at 4lp[m via [place] with increase to 94%, pt c/o pain to left hand which is chronic for her, scheduled to have surgery on Monday, pt is dialysis pt, days are TUes, Thursday and Saturday,

## 2018-10-17 DIAGNOSIS — Z809 Family history of malignant neoplasm, unspecified: Secondary | ICD-10-CM | POA: Diagnosis not present

## 2018-10-17 DIAGNOSIS — I248 Other forms of acute ischemic heart disease: Secondary | ICD-10-CM | POA: Diagnosis present

## 2018-10-17 DIAGNOSIS — E1140 Type 2 diabetes mellitus with diabetic neuropathy, unspecified: Secondary | ICD-10-CM | POA: Diagnosis present

## 2018-10-17 DIAGNOSIS — N186 End stage renal disease: Secondary | ICD-10-CM | POA: Diagnosis present

## 2018-10-17 DIAGNOSIS — Z7984 Long term (current) use of oral hypoglycemic drugs: Secondary | ICD-10-CM | POA: Diagnosis not present

## 2018-10-17 DIAGNOSIS — Z7982 Long term (current) use of aspirin: Secondary | ICD-10-CM | POA: Diagnosis not present

## 2018-10-17 DIAGNOSIS — J1289 Other viral pneumonia: Secondary | ICD-10-CM | POA: Diagnosis present

## 2018-10-17 DIAGNOSIS — E785 Hyperlipidemia, unspecified: Secondary | ICD-10-CM | POA: Diagnosis present

## 2018-10-17 DIAGNOSIS — Z833 Family history of diabetes mellitus: Secondary | ICD-10-CM | POA: Diagnosis not present

## 2018-10-17 DIAGNOSIS — I132 Hypertensive heart and chronic kidney disease with heart failure and with stage 5 chronic kidney disease, or end stage renal disease: Secondary | ICD-10-CM | POA: Diagnosis present

## 2018-10-17 DIAGNOSIS — E1122 Type 2 diabetes mellitus with diabetic chronic kidney disease: Secondary | ICD-10-CM | POA: Diagnosis not present

## 2018-10-17 DIAGNOSIS — M1990 Unspecified osteoarthritis, unspecified site: Secondary | ICD-10-CM | POA: Diagnosis not present

## 2018-10-17 DIAGNOSIS — T380X5A Adverse effect of glucocorticoids and synthetic analogues, initial encounter: Secondary | ICD-10-CM | POA: Diagnosis not present

## 2018-10-17 DIAGNOSIS — D631 Anemia in chronic kidney disease: Secondary | ICD-10-CM | POA: Diagnosis not present

## 2018-10-17 DIAGNOSIS — G9340 Encephalopathy, unspecified: Secondary | ICD-10-CM | POA: Diagnosis not present

## 2018-10-17 DIAGNOSIS — R451 Restlessness and agitation: Secondary | ICD-10-CM | POA: Diagnosis not present

## 2018-10-17 DIAGNOSIS — E1136 Type 2 diabetes mellitus with diabetic cataract: Secondary | ICD-10-CM | POA: Diagnosis present

## 2018-10-17 DIAGNOSIS — J069 Acute upper respiratory infection, unspecified: Secondary | ICD-10-CM | POA: Diagnosis not present

## 2018-10-17 DIAGNOSIS — Z951 Presence of aortocoronary bypass graft: Secondary | ICD-10-CM | POA: Diagnosis not present

## 2018-10-17 DIAGNOSIS — U071 COVID-19: Secondary | ICD-10-CM | POA: Diagnosis present

## 2018-10-17 DIAGNOSIS — I2510 Atherosclerotic heart disease of native coronary artery without angina pectoris: Secondary | ICD-10-CM | POA: Diagnosis not present

## 2018-10-17 DIAGNOSIS — I249 Acute ischemic heart disease, unspecified: Secondary | ICD-10-CM | POA: Diagnosis not present

## 2018-10-17 DIAGNOSIS — Z515 Encounter for palliative care: Secondary | ICD-10-CM | POA: Diagnosis not present

## 2018-10-17 DIAGNOSIS — Z7951 Long term (current) use of inhaled steroids: Secondary | ICD-10-CM | POA: Diagnosis not present

## 2018-10-17 DIAGNOSIS — J1282 Pneumonia due to coronavirus disease 2019: Secondary | ICD-10-CM | POA: Diagnosis present

## 2018-10-17 DIAGNOSIS — J9691 Respiratory failure, unspecified with hypoxia: Secondary | ICD-10-CM | POA: Diagnosis present

## 2018-10-17 DIAGNOSIS — Z66 Do not resuscitate: Secondary | ICD-10-CM | POA: Diagnosis not present

## 2018-10-17 DIAGNOSIS — F0390 Unspecified dementia without behavioral disturbance: Secondary | ICD-10-CM | POA: Diagnosis present

## 2018-10-17 DIAGNOSIS — J9601 Acute respiratory failure with hypoxia: Secondary | ICD-10-CM | POA: Diagnosis present

## 2018-10-17 DIAGNOSIS — I5032 Chronic diastolic (congestive) heart failure: Secondary | ICD-10-CM | POA: Diagnosis not present

## 2018-10-17 DIAGNOSIS — Z992 Dependence on renal dialysis: Secondary | ICD-10-CM | POA: Diagnosis not present

## 2018-10-17 LAB — FERRITIN: Ferritin: 6889 ng/mL — ABNORMAL HIGH (ref 11–307)

## 2018-10-17 LAB — TROPONIN I (HIGH SENSITIVITY): Troponin I (High Sensitivity): 93 ng/L — ABNORMAL HIGH (ref <18)

## 2018-10-17 LAB — CBG MONITORING, ED
Glucose-Capillary: 327 mg/dL — ABNORMAL HIGH (ref 70–99)
Glucose-Capillary: 327 mg/dL — ABNORMAL HIGH (ref 70–99)
Glucose-Capillary: 330 mg/dL — ABNORMAL HIGH (ref 70–99)
Glucose-Capillary: 165 mg/dL — ABNORMAL HIGH (ref 70–99)

## 2018-10-17 LAB — LACTATE DEHYDROGENASE: LDH: 209 U/L — ABNORMAL HIGH (ref 98–192)

## 2018-10-17 LAB — PROCALCITONIN: Procalcitonin: 5.57 ng/mL

## 2018-10-17 LAB — SEDIMENTATION RATE: Sed Rate: 120 mm/hr — ABNORMAL HIGH (ref 0–22)

## 2018-10-17 LAB — LACTIC ACID, PLASMA: Lactic Acid, Venous: 2.4 mmol/L (ref 0.5–1.9)

## 2018-10-17 LAB — BLOOD GAS, ARTERIAL
Acid-Base Excess: 5.1 mmol/L — ABNORMAL HIGH (ref 0.0–2.0)
Bicarbonate: 28.3 mmol/L — ABNORMAL HIGH (ref 20.0–28.0)
FIO2: 36
O2 Saturation: 87.2 %
Patient temperature: 37
pCO2 arterial: 49.5 mmHg — ABNORMAL HIGH (ref 32.0–48.0)
pH, Arterial: 7.397 (ref 7.350–7.450)
pO2, Arterial: 57.1 mmHg — ABNORMAL LOW (ref 83.0–108.0)

## 2018-10-17 LAB — GLUCOSE, CAPILLARY: Glucose-Capillary: 131 mg/dL — ABNORMAL HIGH (ref 70–99)

## 2018-10-17 LAB — D-DIMER, QUANTITATIVE: D-Dimer, Quant: 1.12 ug/mL-FEU — ABNORMAL HIGH (ref 0.00–0.50)

## 2018-10-17 LAB — SARS CORONAVIRUS 2 BY RT PCR (HOSPITAL ORDER, PERFORMED IN ~~LOC~~ HOSPITAL LAB): SARS Coronavirus 2: POSITIVE — AB

## 2018-10-17 LAB — C-REACTIVE PROTEIN: CRP: 20.1 mg/dL — ABNORMAL HIGH (ref <1.0)

## 2018-10-17 LAB — FIBRINOGEN: Fibrinogen: 650 mg/dL — ABNORMAL HIGH (ref 210–475)

## 2018-10-17 MED ORDER — SENNOSIDES-DOCUSATE SODIUM 8.6-50 MG PO TABS
2.0000 | ORAL_TABLET | Freq: Every day | ORAL | Status: DC
  Filled 2018-10-17 (×2): qty 2

## 2018-10-17 MED ORDER — SODIUM CHLORIDE 0.9% FLUSH: 3.0000 mL | INTRAVENOUS | Status: DC | PRN

## 2018-10-17 MED ORDER — GUAIFENESIN-DM 100-10 MG/5ML PO SYRP: 10.0000 mL | ORAL_SOLUTION | ORAL | Status: DC | PRN

## 2018-10-17 MED ORDER — POLYETHYLENE GLYCOL 3350 17 G PO PACK: 17.0000 g | PACK | Freq: Every day | ORAL | Status: DC | PRN

## 2018-10-17 MED ORDER — LOPERAMIDE HCL 2 MG PO CAPS: 2.0000 mg | ORAL_CAPSULE | Freq: Every day | ORAL | Status: DC | PRN

## 2018-10-17 MED ORDER — DEXAMETHASONE SODIUM PHOSPHATE 10 MG/ML IJ SOLN
6.0000 mg | Freq: Every day | INTRAMUSCULAR | Status: DC
  Administered 2018-10-17: 6 mg via INTRAVENOUS
  Filled 2018-10-17: qty 1

## 2018-10-17 MED ORDER — ALBUTEROL SULFATE (2.5 MG/3ML) 0.083% IN NEBU: 3.0000 mL | INHALATION_SOLUTION | Freq: Four times a day (QID) | RESPIRATORY_TRACT | Status: DC

## 2018-10-17 MED ORDER — MELATONIN 3 MG PO TABS
5.0000 mg | ORAL_TABLET | Freq: Every day | ORAL | Status: DC
  Filled 2018-10-17: qty 1.5
  Filled 2018-10-17: qty 1

## 2018-10-17 MED ORDER — ZINC SULFATE 220 (50 ZN) MG PO CAPS
220.0000 mg | ORAL_CAPSULE | Freq: Every day | ORAL | Status: DC
  Administered 2018-10-17: 220 mg via ORAL
  Filled 2018-10-17 (×2): qty 1

## 2018-10-17 MED ORDER — HEPARIN SODIUM (PORCINE) 5000 UNIT/ML IJ SOLN
5000.0000 [IU] | Freq: Three times a day (TID) | INTRAMUSCULAR | Status: DC
  Administered 2018-10-17 – 2018-10-18 (×3): 5000 [IU] via SUBCUTANEOUS
  Filled 2018-10-17 (×3): qty 1

## 2018-10-17 MED ORDER — DONEPEZIL HCL 5 MG PO TABS
10.0000 mg | ORAL_TABLET | Freq: Every day | ORAL | Status: DC
  Administered 2018-10-17: 10 mg via ORAL
  Filled 2018-10-17: qty 1
  Filled 2018-10-17: qty 2
  Filled 2018-10-17: qty 1

## 2018-10-17 MED ORDER — VITAMIN C 500 MG PO TABS
500.0000 mg | ORAL_TABLET | Freq: Every day | ORAL | Status: DC
  Administered 2018-10-17: 500 mg via ORAL
  Filled 2018-10-17 (×2): qty 1

## 2018-10-17 MED ORDER — ONDANSETRON HCL 4 MG PO TABS: 4.0000 mg | ORAL_TABLET | Freq: Four times a day (QID) | ORAL | Status: DC | PRN

## 2018-10-17 MED ORDER — MIDODRINE HCL 5 MG PO TABS
5.0000 mg | ORAL_TABLET | Freq: Every day | ORAL | Status: DC
  Administered 2018-10-17: 5 mg via ORAL
  Filled 2018-10-17 (×2): qty 1

## 2018-10-17 MED ORDER — ACETAMINOPHEN 650 MG RE SUPP: 650.0000 mg | Freq: Four times a day (QID) | RECTAL | Status: DC | PRN

## 2018-10-17 MED ORDER — ONDANSETRON HCL 4 MG/2ML IJ SOLN: 4.0000 mg | Freq: Four times a day (QID) | INTRAMUSCULAR | Status: DC | PRN

## 2018-10-17 MED ORDER — SEVELAMER CARBONATE 0.8 G PO PACK
0.8000 g | PACK | Freq: Three times a day (TID) | ORAL | Status: DC
  Administered 2018-10-17 (×3): 0.8 g via ORAL
  Filled 2018-10-17 (×7): qty 1

## 2018-10-17 MED ORDER — ASPIRIN 81 MG PO CHEW
81.0000 mg | CHEWABLE_TABLET | Freq: Every day | ORAL | Status: DC
  Administered 2018-10-17: 81 mg via ORAL
  Filled 2018-10-17: qty 1

## 2018-10-17 MED ORDER — GLIPIZIDE ER 2.5 MG PO TB24: 2.5000 mg | ORAL_TABLET | Freq: Every day | ORAL | Status: DC

## 2018-10-17 MED ORDER — BISACODYL 10 MG RE SUPP: 10.0000 mg | Freq: Every day | RECTAL | Status: DC | PRN

## 2018-10-17 MED ORDER — SODIUM CHLORIDE 0.9 % IV SOLN
100.0000 mg | INTRAVENOUS | Status: DC
  Filled 2018-10-17: qty 20

## 2018-10-17 MED ORDER — HYDROCOD POLST-CPM POLST ER 10-8 MG/5ML PO SUER: 5.0000 mL | Freq: Two times a day (BID) | ORAL | Status: DC | PRN

## 2018-10-17 MED ORDER — CETAPHIL MOISTURIZING EX LOTN
1.0000 "application " | TOPICAL_LOTION | Freq: Four times a day (QID) | CUTANEOUS | Status: DC | PRN
  Filled 2018-10-17: qty 118

## 2018-10-17 MED ORDER — LIDOCAINE-PRILOCAINE 2.5-2.5 % EX CREA
1.0000 "application " | TOPICAL_CREAM | CUTANEOUS | Status: DC | PRN
  Filled 2018-10-17: qty 5

## 2018-10-17 MED ORDER — POLYVINYL ALCOHOL 1.4 % OP SOLN
1.0000 [drp] | Freq: Two times a day (BID) | OPHTHALMIC | Status: DC
  Administered 2018-10-17 – 2018-10-18 (×3): 1 [drp] via OPHTHALMIC
  Filled 2018-10-17 (×2): qty 15

## 2018-10-17 MED ORDER — SODIUM CHLORIDE 0.9 % IV SOLN: 250.0000 mL | INTRAVENOUS | Status: DC | PRN

## 2018-10-17 MED ORDER — INSULIN ASPART 100 UNIT/ML ~~LOC~~ SOLN
4.0000 [IU] | Freq: Three times a day (TID) | SUBCUTANEOUS | Status: DC
  Administered 2018-10-17 (×3): 4 [IU] via SUBCUTANEOUS
  Filled 2018-10-17 (×3): qty 1

## 2018-10-17 MED ORDER — ROSUVASTATIN CALCIUM 5 MG PO TABS
5.0000 mg | ORAL_TABLET | Freq: Every day | ORAL | Status: DC
  Administered 2018-10-17: 5 mg via ORAL
  Filled 2018-10-17 (×3): qty 1

## 2018-10-17 MED ORDER — TRAZODONE HCL 50 MG PO TABS: 50.0000 mg | ORAL_TABLET | Freq: Every evening | ORAL | Status: DC | PRN

## 2018-10-17 MED ORDER — INSULIN ASPART 100 UNIT/ML ~~LOC~~ SOLN
0.0000 [IU] | Freq: Every day | SUBCUTANEOUS | Status: DC
  Administered 2018-10-18: 4 [IU] via SUBCUTANEOUS

## 2018-10-17 MED ORDER — NEPRO/CARBSTEADY PO LIQD
237.0000 mL | Freq: Two times a day (BID) | ORAL | Status: DC
  Administered 2018-10-17 – 2018-10-18 (×2): 237 mL via ORAL
  Filled 2018-10-17 (×4): qty 237

## 2018-10-17 MED ORDER — ACETAMINOPHEN 325 MG PO TABS
650.0000 mg | ORAL_TABLET | Freq: Four times a day (QID) | ORAL | Status: DC | PRN
  Administered 2018-10-18: 650 mg via ORAL
  Filled 2018-10-17: qty 2

## 2018-10-17 MED ORDER — PANTOPRAZOLE SODIUM 40 MG PO TBEC
40.0000 mg | DELAYED_RELEASE_TABLET | Freq: Every day | ORAL | Status: DC
  Administered 2018-10-17: 40 mg via ORAL
  Filled 2018-10-17: qty 1

## 2018-10-17 MED ORDER — OXYCODONE HCL 5 MG PO TABS: 5.0000 mg | ORAL_TABLET | ORAL | Status: DC | PRN

## 2018-10-17 MED ORDER — INSULIN ASPART 100 UNIT/ML ~~LOC~~ SOLN
0.0000 [IU] | Freq: Three times a day (TID) | SUBCUTANEOUS | Status: DC
  Administered 2018-10-17: 4 [IU] via SUBCUTANEOUS
  Administered 2018-10-17 (×2): 15 [IU] via SUBCUTANEOUS
  Administered 2018-10-18: 7 [IU] via SUBCUTANEOUS
  Administered 2018-10-18: 3 [IU] via SUBCUTANEOUS
  Administered 2018-10-18: 4 [IU] via SUBCUTANEOUS
  Filled 2018-10-17 (×3): qty 1

## 2018-10-17 MED ORDER — SODIUM CHLORIDE 0.9 % IV SOLN
200.0000 mg | Freq: Once | INTRAVENOUS | Status: AC
  Administered 2018-10-17: 200 mg via INTRAVENOUS
  Filled 2018-10-17: qty 40

## 2018-10-17 MED ORDER — SODIUM CHLORIDE 0.9% FLUSH
3.0000 mL | Freq: Two times a day (BID) | INTRAVENOUS | Status: DC
  Administered 2018-10-17 – 2018-10-18 (×3): 3 mL via INTRAVENOUS

## 2018-10-17 MED ORDER — GUAIFENESIN ER 600 MG PO TB12
600.0000 mg | ORAL_TABLET | Freq: Two times a day (BID) | ORAL | Status: DC
  Administered 2018-10-17 (×2): 600 mg via ORAL
  Filled 2018-10-17 (×2): qty 1

## 2018-10-17 MED ORDER — LIDOCAINE 4 % EX PTCH
1.0000 | MEDICATED_PATCH | Freq: Every day | CUTANEOUS | Status: DC
  Administered 2018-10-17: 1 via CUTANEOUS
  Filled 2018-10-17 (×2): qty 1

## 2018-10-17 MED ORDER — BIOTENE DRY MOUTH MT LIQD
15.0000 mL | Freq: Every day | OROMUCOSAL | Status: DC
  Administered 2018-10-17 – 2018-10-19 (×5): 15 mL via OROMUCOSAL

## 2018-10-17 MED ORDER — INSULIN GLARGINE 100 UNIT/ML ~~LOC~~ SOLN
20.0000 [IU] | Freq: Once | SUBCUTANEOUS | Status: AC
  Administered 2018-10-17: 20 [IU] via SUBCUTANEOUS
  Filled 2018-10-17: qty 0.2

## 2018-10-17 MED ORDER — DIALYVITE 3000 3 MG PO TABS: 1.0000 | ORAL_TABLET | Freq: Every day | ORAL | Status: DC

## 2018-10-17 MED ORDER — MELATONIN 3 MG PO TABS
3.0000 mg | ORAL_TABLET | Freq: Every day | ORAL | Status: DC
  Administered 2018-10-17: 3 mg via ORAL
  Filled 2018-10-17 (×3): qty 1

## 2018-10-17 MED ORDER — ADULT MULTIVITAMIN W/MINERALS CH
1.0000 | ORAL_TABLET | Freq: Every day | ORAL | Status: DC
  Administered 2018-10-17: 1 via ORAL
  Filled 2018-10-17: qty 1

## 2018-10-17 MED ORDER — ALBUTEROL SULFATE HFA 108 (90 BASE) MCG/ACT IN AERS
2.0000 | INHALATION_SPRAY | Freq: Four times a day (QID) | RESPIRATORY_TRACT | Status: DC
  Administered 2018-10-17 – 2018-10-18 (×6): 2 via RESPIRATORY_TRACT
  Filled 2018-10-17 (×2): qty 6.7

## 2018-10-17 MED ORDER — FLUTICASONE PROPIONATE 50 MCG/ACT NA SUSP
1.0000 | Freq: Every day | NASAL | Status: DC
  Administered 2018-10-17: 1 via NASAL
  Filled 2018-10-17: qty 16

## 2018-10-17 NOTE — ED Notes (Signed)
CRITICAL VALUE ALERT  Critical Value: SARS Coronavirus 2 Positive  Date & Time Notied: 10/17/2018 0037  Provider Notified: Dr. Wyvonnia Dusky  Orders Received/Actions taken: n/a

## 2018-10-17 NOTE — Progress Notes (Signed)
Pharmacy Note - Remdesivir Dosing  O: Admitted to [facility] for COVID + and ESRD.  Pharmacy asked to start Remdesivir.  ALT: 19 CXR:  There are multifocal airspace opacities bilaterally, greatest within the left lower and left mid lung zone SpO2: 81% - on 6L O2   A/P:  Patient meets criteria for remdesivir.  Begin remdesivir 200 mg IV x 1, followed by 100 mg IV daily x 4 days  Monitor ALT, clinical progress  Marguerite Olea, [facility] Clinical Pharmacist Phone [phone]  10/17/2018 9:19 PM

## 2018-10-17 NOTE — ED Notes (Signed)
[name] (poa) [phone]  Nephew updated on plan of care,

## 2018-10-17 NOTE — ED Notes (Signed)
CRITICAL VALUE ALERT  Critical Value: Lactic Acid 2.4  Date & Time Notied: 10/17/2018 0204  Provider Notified: Roxan Hockey, MD  Orders Received/Actions taken: n/a

## 2018-10-17 NOTE — Progress Notes (Signed)
85yo female found to be Covid+ with ESRD. Due to ESRD she is being admitted to [facility] via [facility]. Spoke with Dr. Denton Brick with [facility]. IMTS service will take over care tomorrow 7/20 at [place], Alliene Klugh A, DO 10/17/2018, 6:55 AM Pager: [phone]

## 2018-10-17 NOTE — H&P (Signed)
Patient Demographics:    [name], is a 85 y.o. female  MRN: [id number]   DOB - 10/25/1930  Admit Date - 10/05/2018  Outpatient Primary MD for the patient is Stacey Adie, MD   Assessment & Plan:    Principal Problem:   Acute Respiratory failure with hypoxia due to COVID -19 Infection Active Problems:   ESRD on hemodialysis ([place])   Acute respiratory disease due to COVID-19 virus   Multifocal Pneumonia due to COVID-19 virus   Anemia of chronic renal failure   CAD/Hx of CABG   Chronic diastolic heart failure (HCC)   Type 2 diabetes mellitus with renal manifestations ([place])   1)Acute Hypoxic Respiratory Failure secondary to COVID-19 infection/Pneumonia--- The treatment plan and use of medications  for treatment of COVID-19 infection and possible side effects were discussed with patient/family,  explained that there is No proven definitive treatment for COVID-19 infection, any medications used here are based on published clinical articles/anecdotal data which at times and not yet peer-reviewed or randomized control trials. Complete risks and long-term side effects are unknown, however in the best clinical judgment they seem to be of some clinical benefit . --Potential side effects of Remdesivir including, but not limited to allergic reaction, nausea, vomiting, elevated LFTs discussed with Patient and POA (Stacey Nemesis, Long discussed potential steroid side effect including elevated blood sugars, elevated blood pressure, psychosis/anxiety,  insomnia --Patient and POA ([name]) verbalizes understanding and agrees to treatment protocols   --Patient is positive for COVID-19 infection, chest x-ray with findings of infiltrates/opacities,  patient is hypoxic and requiring continuous supplemental  oxygen---patient meets criteria for initiation of Remdesivir AND Decadron/Steroid therapy per protocol  --Check and trend fibrinogen, CRP, pro calcitonin, CBC, BMP, d-dimer, LDH, ferritin and LFTs -ABG with Pa02 of 57 (on 36 % Fi02) --Supplemental oxygen to keep O2 sats above 93% -Follow serial chest x-rays and ABGs as indicated --- Encourage prone positioning for More than 16 hours/day in increments of 2 to 3 hours at a time if able to tolerate --Attempt to maintain euvolemic state --Zinc and vitamin C as ordered -Albuterol inhaler as needed  2)ESRD --- T/T/S, last HD session on 10/19/2018 she was scheduled to have vascular surgery intervention onto her left AV fistula on 10/02/2018... Nephrology consult for ongoing HD treatments while inpatient --- Continue midodrine for pressure support especially on hemodialysis days  3)Social/Ethics----- Plan of care and code status Discussed with Pt and  [name] (poa) [phone], [facility]  She is a full code with no limitations to treatment at this time  4)DM2--- blood sugar was 569 on admission, bicarb 26 , anion gap 14 , anticipate worsening hyperglycemia with steroid use, .  No recent A1c available, initiate insulin therapy while on steroids... Okay to hold glipizide and Prandin  5)Anemia of CKD --- stable, hemoglobin is currently 9.1, EPO agent per nephrology team  6)H/o CAD---  status post prior CABG, stable at this time, no ACS type symptoms, troponin is 39, continue aspirin and Crestor  With History of - Reviewed by me  Past Medical History:  Diagnosis Date   Absent pedal pulses    Anemia of chronic renal failure 10/05/2010   Nice response to Procrit    Anemia, iron deficiency 08/18/2012   Ataxia involving legs    has had several falls   CAD (coronary artery disease), native coronary artery 07/05/2016   3VD by cath 2008  01/22/07 CABG with LIMA to LAD< SVG to dx, SVG to OM1-2, SVG to PDA Dr.  Roxan Hockey   Chronic diastolic heart failure ([place]) 05/23/2014   Coronary artery disease    Dementia ([place])    "mild" per MD note   Diabetes mellitus    Dyspnea    ESRD on hemodialysis ([place])    Family history of anesthesia complication    Nephew- had time waking   Hyperlipidemia    Hypertension    Hypertensive heart disease without CHF 11/10/2013   Neuropathy    Osteoarthritis 01/01/2013   Type 2 diabetes mellitus with renal manifestations ([place]) 02/01/2014      Past Surgical History:  Procedure Laterality Date   A/V FISTULAGRAM Left 06/02/2018   Procedure: A/V FISTULAGRAM;  Surgeon: [name], MD;  Location: [facility];  Service: Cardiovascular;  Laterality: Left;   CATARACT EXTRACTION     cataract   CORONARY ARTERY BYPASS GRAFT  2008   EYE SURGERY     FEMUR FRACTURE SURGERY     FEMUR IM NAIL Left 07/05/2016   Procedure: INTRAMEDULLARY (IM) RETROGRADE FEMORAL NAILING LEFT;  Surgeon: Marybelle Killings, MD;  Location: [facility];  Service: Orthopedics;  Laterality: Left;   INSERTION OF DIALYSIS CATHETER Left 09/05/2013   Procedure: INSERTION OF DIALYSIS CATHETER;  Surgeon: Conrad McDuffie, MD;  Location: [facility];  Service: Vascular;  Laterality: Left;   IR DIALY SHUNT INTRO NEEDLE/INTRACATH INITIAL W/IMG LEFT Left 09/15/2016   IR DIALY SHUNT INTRO NEEDLE/INTRACATH INITIAL W/IMG LEFT Left 04/08/2017   IR DIALY SHUNT INTRO NEEDLE/INTRACATH INITIAL W/IMG LEFT Left 03/09/2018   IR US GUIDE VASC ACCESS LEFT  03/09/2018   PERIPHERAL VASCULAR INTERVENTION Left 06/02/2018   Procedure: PERIPHERAL VASCULAR INTERVENTION;  Surgeon: [name], MD;  Location: [facility];  Service: Cardiovascular;  Laterality: Left;      Chief Complaint  Patient presents with   Shortness of Breath      HPI:    [name]  is a 85 y.o. female end-stage renal disease and currently dialyzes via left arm brachiocephalic, last HD session 10/06/2018, DM2 (not on insulin),  Dementia, HLD, chronic anemia of CKD, h/o CAD with prior CABG, HTN, HFpEF who presents to the ED with complaints of shortness of breath that got worse within the last 24 hours, in the ED patient was found to be very hypoxic with O2 sats of 75% on room air, she was initially placed on nonrebreather bag-- --she also denies fevers or chills --Apparently patient has had fatigue and malaise for at least a week --She lives alone and has home health services  --- She is a patient of [facility]  --No productive cough, no chest pains, no vomiting, no diarrhea  In ED--- she is found to have blood sugars over 569,  bicarb 26 , anion gap 14 , Lactic acid 3.0, repeat 2.4  - Hgb 9.1 , WBC-6.8, Platelet 144,  Creatinine--- 4.27, INR 1.1, PTT 43  COVID -19  Positive  Troponin 39, BNP--- 301,   Lipase 157  In ED--- chest x-ray with multifocal pneumonia left more than right  I called and discussed patient's care with POA/nephew- Chrissie Noa Riffel (poa) [phone]   In addition to the HPI above,   A full Review of  Systems was done, all other systems reviewed are negative except as noted above in HPI , .    Social History:  Reviewed by me    Social History   Tobacco Use   Smoking status: Never Smoker   Smokeless tobacco: Never Used  Substance Use Topics   Alcohol use: Not Currently     Family History :  Reviewed by me    Family History  Problem Relation Age of Onset   Diabetes Father    Cancer Sister    Aneurysm Mother      Home Medications:   Prior to Admission medications   Medication Sig Start Date End Date Taking? Authorizing Provider  acetaminophen (TYLENOL) 650 MG CR tablet Take 1,300 mg by mouth 2 (two) times a day.    [provider]  antiseptic oral rinse (BIOTENE) LIQD 15 mLs by Mouth Rinse route 6 (six) times daily.    [provider]  aspirin 81 MG chewable tablet Chew 81 mg by mouth daily.     [provider]  donepezil  (ARICEPT) 10 MG tablet Take 10 mg by mouth at bedtime. For MEMORY     [provider]  doxycycline (VIBRAMYCIN) 100 MG capsule Take 100 mg by mouth 2 (two) times daily. For 7 days - completes on 10/21/2018    [provider]  Emollient [facility]) LOTN Apply 1 application topically 4 (four) times daily as needed (dry skin).    [provider]  fluticasone (FLONASE) 50 MCG/ACT nasal spray Place 1 spray into both nostrils daily.    [provider]  folic acid-vitamin b complex-vitamin c-selenium-zinc (DIALYVITE) 3 MG TABS tablet Take 1 tablet by mouth daily.    [provider]  glipiZIDE (GLUCOTROL XL) 2.5 MG 24 hr tablet Take 2.5 mg by mouth daily.    [provider]  Lidocaine 4 % PTCH Apply 1 patch topically daily. Apply to left knee    [provider]  lidocaine-prilocaine (EMLA) cream Apply 1 application topically as needed (port access).     [provider]  loperamide (IMODIUM A-D) 2 MG tablet Take 2 mg by mouth daily as needed for diarrhea or loose stools.     [provider]  Melatonin ER 5 MG TBCR Take 5 mg by mouth at bedtime.    [provider]  midodrine (PROAMATINE) 5 MG tablet Take 5 mg by mouth daily.    [provider]  Nutritional Supplements (FEEDING SUPPLEMENT, NEPRO CARB STEADY,) LIQD Take 237 mLs by mouth 2 (two) times daily between meals. 07/09/16   Rai, Ripudeep K, MD  pantoprazole (PROTONIX) 40 MG tablet Take 40 mg by mouth daily.    [provider]  Propylene Glycol (SYSTANE BALANCE) 0.6 % SOLN Place 1 drop into both eyes 2 (two) times daily.     [provider]  repaglinide (PRANDIN) 1 MG tablet Take 0.5 mg by mouth 3 (three) times daily before meals.    [provider]  rosuvastatin (CRESTOR) 5 MG tablet Take 5 mg by mouth at bedtime. For CHOLESTEROL    [provider]  sevelamer carbonate (RENVELA) 0.8  g PACK packet Take 0.8 g by mouth 3 (three)  times daily with meals.    [provider]     Allergies:    No Known Allergies   Physical Exam:   Vitals  Blood pressure 134/65, pulse 100, temperature 99.1 F (37.3 C), resp. rate (!) 29, height 5\' 4"  (1.626 m), weight 76.6 kg, SpO2 93 %.  Physical Examination: General appearance - alert, conversational dyspnea noted Mental status - alert, oriented to person, place, and time,  Nose- [place] 4 L/min Eyes - sclera anicteric Neck - supple, no JVD elevation , Chest -tachypneic, diminished breath sounds bilaterally with scattered rhonchi , left more than right  heart - S1 and S2 normal, regular , prior sternotomy scar Abdomen - soft, nontender, nondistended, no masses or organomegaly, no CVA area tenderness Neurological - screening mental status exam normal, neck supple without rigidity, cranial nerves II through XII intact, DTR's normal and symmetric Extremities - no pedal edema noted, intact peripheral pulses  Skin - warm, dry MSK--left antecubital fossa area with AV fistula with positive thrill and bruit    Data Review:    CBC Recent Labs  Lab 10/23/2018 2300  WBC 6.8  HGB 9.1*  HCT 29.3*  PLT 144*  MCV 96.1  MCH 29.8  MCHC 31.1  RDW 17.2*  LYMPHSABS 0.3*  MONOABS 0.4  EOSABS 0.0  BASOSABS 0.0   ------------------------------------------------------------------------------------------------------------------  Chemistries  Recent Labs  Lab 10/01/2018 2300  NA 132*  K 3.6  CL 92*  CO2 26  GLUCOSE 569*  BUN 34*  CREATININE 4.27*  CALCIUM 8.1*  AST 30  ALT 19  ALKPHOS 99  BILITOT 1.0   ------------------------------------------------------------------------------------------------------------------ estimated creatinine clearance is 9.7 mL/min (A) (by C-G formula based on SCr of 4.27 mg/dL (H)). ------------------------------------------------------------------------------------------------------------------ No results for input(s): TSH, T4TOTAL,  T3FREE, THYROIDAB in the last 72 hours.  Invalid input(s): FREET3   Coagulation profile Recent Labs  Lab 10/11/2018 2300  INR 1.1   ------------------------------------------------------------------------------------------------------------------- Recent Labs    10/17/18 0127  DDIMER 1.12*   -------------------------------------------------------------------------------------------------------------------  Cardiac Enzymes No results for input(s): CKMB, TROPONINI, MYOGLOBIN in the last 168 hours.  Invalid input(s): CK ------------------------------------------------------------------------------------------------------------------    Component Value Date/Time   BNP 301.0 (H) 10/25/2018 2310     ---------------------------------------------------------------------------------------------------------------  Urinalysis    Component Value Date/Time   COLORURINE AMBER (A) 08/18/2016 2210   APPEARANCEUR CLOUDY (A) 08/18/2016 2210   APPEARANCEUR Clear 10/12/2013 1254   LABSPEC 1.018 08/18/2016 2210   LABSPEC 1.004 10/12/2013 1254   PHURINE 7.0 08/18/2016 2210   GLUCOSEU NEGATIVE 08/18/2016 2210   GLUCOSEU Negative 10/12/2013 1254   HGBUR SMALL (A) 08/18/2016 2210   BILIRUBINUR NEGATIVE 08/18/2016 2210   BILIRUBINUR Negative 10/12/2013 1254   KETONESUR NEGATIVE 08/18/2016 2210   PROTEINUR 30 (A) 08/18/2016 2210   UROBILINOGEN 0.2 08/24/2013 1454   NITRITE NEGATIVE 08/18/2016 2210   LEUKOCYTESUR TRACE (A) 08/18/2016 2210   LEUKOCYTESUR Negative 10/12/2013 1254    ----------------------------------------------------------------------------------------------------------------   Imaging Results:    Dg Chest Portable 1 View  Result Date: 10/12/2018 CLINICAL DATA:  Shortness of breath EXAM: PORTABLE CHEST 1 VIEW COMPARISON:  07/22/2017 FINDINGS: The lung volumes are low. The patient is status post prior median sternotomy. The heart size is enlarged. There are multifocal  airspace opacities bilaterally, greatest within the left lower and left mid lung zone. There is no pneumothorax. No large pleural effusion. IMPRESSION: 1. Multifocal airspace opacities as detailed above which is nonspecific and can be seen  in patients with multifocal pneumonia (viral or bacterial) or developing pulmonary edema. 2. Cardiomegaly. 3. Low lung volumes. Electronically Signed   By: Constance Holster M.D.   On: 10/22/2018 23:41    Radiological Exams on Admission: Dg Chest Portable 1 View  Result Date: 10/05/2018 CLINICAL DATA:  Shortness of breath EXAM: PORTABLE CHEST 1 VIEW COMPARISON:  07/22/2017 FINDINGS: The lung volumes are low. The patient is status post prior median sternotomy. The heart size is enlarged. There are multifocal airspace opacities bilaterally, greatest within the left lower and left mid lung zone. There is no pneumothorax. No large pleural effusion. IMPRESSION: 1. Multifocal airspace opacities as detailed above which is nonspecific and can be seen in patients with multifocal pneumonia (viral or bacterial) or developing pulmonary edema. 2. Cardiomegaly. 3. Low lung volumes. Electronically Signed   By: Constance Holster M.D.   On: 10/08/2018 23:41   DVT Prophylaxis -SCD/Heparin AM Labs Ordered, also please review Full Orders  Family Communication: Admission, patients condition and plan of care including tests being ordered have been discussed with the patient and POA- Mercie Eon who indicate understanding and agree with the plan   Code Status - Full Code  Likely DC to  TBD  Condition   Rogue Jury M.D on 10/17/2018 at 2:02 AM Go to [email or web address] -  for contact info  [facility] - Office  [phone]

## 2018-10-17 NOTE — Progress Notes (Addendum)
PROGRESS NOTE  [name] AOZ:[id number] DOB: [birth year] DOA: 10/03/2018 PCP: Janifer Adie, MD  Brief History:  85 year old female with a history of ESRD ([place]), coronary disease, diastolic CHF, diabetes mellitus type 2, hypertension, hyperlipidemia, and cognitive impairment presenting with shortness of breath and malaise for the last 2 to 3 days with significant worsening in the last 24 hours prior to admission.  The patient states that other than going to dialysis on her usual days, the patient did visit the beautician on 10/11/2018.  Otherwise, the patient denies any COVID positive contacts and lives by herself.  She has not had any other travels.  She endorses compliance with all her medications.  She denied any fevers, chills, chest pain, headache, neck pain, sore throat, nausea, vomiting, diarrhea, abdominal pain.  Because of worsening shortness of breath, the patient presented for further care.  In the emergency department, the patient had a temperature of 9 9.1 F.  Oxygen saturation was 75% on room air.  She was placed on 4 L nasal cannula with oxygen saturation 92%.  BMP showed a sodium 132 with serum creatinine 4.27.  LFTs were essentially unremarkable.  WBC 6.8, hemoglobin 9.1, platelets 144,000. SARS-CoV2 NAA positive.  Chest x-ray showed multifocal infiltrates, regarding the left.  ABG showed 7.3 9/49/57/28 on 4 L.  She was started on dexamethasone, and remdesivir was ordered.  In the emergency department, the patient received vancomycin, cefepime, and metronidazole prior to admission.  Assessment/Plan: Acute respiratory failure with hypoxia due to COVID-19 infection -Presently stable on 4.5 L -Wean oxygen as tolerated -The patient appears clinically euvolemic -Continue dexamethasone -start remdesivir -Personally view chest x-ray--bilateral infiltrates, recurrent left -continue vitamin C, zinc -maintain euvolemia -CRP 20.1 -Ferritin 6889 -Procalcitonin 5.07  -D-dimer 1.12 -ESR 120 -Repeat inflammatory markers a.m. 10/01/2018  Elevated troponin -Secondary to ESRD and demand ischemia -No chest pain presently -Personally reviewed EKG--sinus rhythm, nonspecific T wave changes -troponin 39>>>93  Elevated lipase -Secondary to ESRD status -Patient does not have pancreatitis  AVF dysfunction -pt likely has steal syndrome -suppose to have had surgery on 10/05/2018 (Dr. Monica Martinez) -please notify/consult vascular if indicated  Diabetes mellitus type 2 -07/08/2016 hemoglobin A1c 5.6 -Anticipate elevated CBGs secondary to steroids -NovoLog sliding scale -Patient presented with serum glucose 569 -Recheck hemoglobin A1c -Holding Prandin and glipizide  ESRD -Patient is compliant with dialysis -last HD 10/02/2018 -clinically euvolemic -nephrology notified of transfer--spoke with Dr. Grayland Ormond  Hyperlipidemia -continue statin  Chronic diastolic CHF -12/05/2950 echo EF 65-70%, grade 1 DD, PASP 55   Total time spent 35 minutes in addition to time spent by Dr. Denton Brick.  Greater than 50% spent face to face counseling and coordinating care.     Disposition Plan:   Transfer to [facility]  Family Communication:   Son updated on phone 7/19  Consultants:  renal  Code Status:  FULL   DVT Prophylaxis:  [place] Heparin   Procedures: As Listed in Progress Note Above  Antibiotics: Vanc/cefepime/metronidazole    PPE: bouffant cap, N95 mask, face shield, gown, gloves   Subjective: Pt states she is breathing a little better than yesterday, but still sob.  Denies f/c, cp, headache, n/v/d, abd pain,   Objective: Vitals:   10/17/18 0500 10/17/18 0530 10/17/18 0600 10/17/18 0630  BP: (!) 118/56 116/62 (!) 115/57 (!) 120/55  Pulse: 86 87 85 79  Resp: (!) 25 (!) 24 (!) 26 17  Temp:      SpO2:  91% 91% 92% 94%  Weight:      Height:       No intake or output data in the 24 hours ending 10/17/18 0729 Weight change:  Exam:   General:  Pt is  alert, follows commands appropriately, not in acute distress  HEENT: No icterus, No thrush, No neck mass, [place]/AT  Cardiovascular: RRR, S1/S2, no rubs, no gallops  Respiratory: bilateral rales R>L, no wheeze  Abdomen: Soft/+BS, non tender, non distended, no guarding  Extremities: No edema, No lymphangitis, No petechiae, No rashes, no synovitis   Data Reviewed: I have personally reviewed following labs and imaging studies Basic Metabolic Panel: Recent Labs  Lab 10/12/2018 2300  NA 132*  K 3.6  CL 92*  CO2 26  GLUCOSE 569*  BUN 34*  CREATININE 4.27*  CALCIUM 8.1*   Liver Function Tests: Recent Labs  Lab 10/10/2018 2300  AST 30  ALT 19  ALKPHOS 99  BILITOT 1.0  PROT 6.7  ALBUMIN 2.7*   Recent Labs  Lab 10/09/2018 2310  LIPASE 157*   No results for input(s): AMMONIA in the last 168 hours. Coagulation Profile: Recent Labs  Lab 10/28/2018 2300  INR 1.1   CBC: Recent Labs  Lab 10/29/2018 2300  WBC 6.8  NEUTROABS 6.1  HGB 9.1*  HCT 29.3*  MCV 96.1  PLT 144*   Cardiac Enzymes: No results for input(s): CKTOTAL, CKMB, CKMBINDEX, TROPONINI in the last 168 hours. BNP: Invalid input(s): POCBNP CBG: Recent Labs  Lab 10/17/18 0327  GLUCAP 327*   HbA1C: No results for input(s): HGBA1C in the last 72 hours. Urine analysis:    Component Value Date/Time   COLORURINE AMBER (A) 08/18/2016 2210   APPEARANCEUR CLOUDY (A) 08/18/2016 2210   APPEARANCEUR Clear 10/12/2013 1254   LABSPEC 1.018 08/18/2016 2210   LABSPEC 1.004 10/12/2013 1254   PHURINE 7.0 08/18/2016 2210   GLUCOSEU NEGATIVE 08/18/2016 2210   GLUCOSEU Negative 10/12/2013 1254   HGBUR SMALL (A) 08/18/2016 2210   BILIRUBINUR NEGATIVE 08/18/2016 2210   BILIRUBINUR Negative 10/12/2013 1254   KETONESUR NEGATIVE 08/18/2016 2210   PROTEINUR 30 (A) 08/18/2016 2210   UROBILINOGEN 0.2 08/24/2013 1454   NITRITE NEGATIVE 08/18/2016 2210   LEUKOCYTESUR TRACE (A) 08/18/2016 2210   LEUKOCYTESUR Negative 10/12/2013  1254   Sepsis Labs: '@LABRCNTIP' (procalcitonin:4,lacticidven:4) ) Recent Results (from the past 240 hour(s))  Blood culture (routine x 2)     Status: None (Preliminary result)   Collection Time: 10/17/2018 11:00 PM   Specimen: BLOOD RIGHT ARM  Result Value Ref Range Status   Specimen Description BLOOD RIGHT ARM  Final   Special Requests   Final    BOTTLES DRAWN AEROBIC AND ANAEROBIC Blood Culture adequate volume Performed at [facility], [address] [place], [place] [address]    Culture PENDING  Incomplete   Report Status PENDING  Incomplete  SARS Coronavirus 2 (CEPHEID- Performed in [facility] hospital lab), Hosp Order     Status: Abnormal   Collection Time: 10/09/2018 11:01 PM   Specimen: Nasopharyngeal Swab  Result Value Ref Range Status   SARS Coronavirus 2 POSITIVE (A) NEGATIVE Final    Comment: RESULT CALLED TO, READ BACK BY AND VERIFIED WITH: DOSS,M @ 0037 ON 10/17/18 BY JUW Performed at [facility], [address], [place], [place] [address]   Blood culture (routine x 2)     Status: None (Preliminary result)   Collection Time: 09/30/2018 11:05 PM   Specimen: BLOOD RIGHT WRIST  Result Value Ref Range  Status   Specimen Description BLOOD RIGHT WRIST  Final   Special Requests   Final    BOTTLES DRAWN AEROBIC AND ANAEROBIC Blood Culture adequate volume Performed at [facility], [address], [place], [place] [address]    Culture PENDING  Incomplete   Report Status PENDING  Incomplete     Scheduled Meds: . albuterol  2 puff Inhalation Q6H  . antiseptic oral rinse  15 mL Mouth Rinse 6 X Daily  . aspirin  81 mg Oral Daily  . dexamethasone (DECADRON) injection  6 mg Intravenous Daily  . donepezil  10 mg Oral QHS  . feeding supplement (NEPRO CARB STEADY)  237 mL Oral BID BM  . fluticasone  1 spray Each Nare Daily  . guaiFENesin  600 mg Oral BID  . heparin  5,000 Units Subcutaneous Q8H  . insulin aspart  0-20 Units Subcutaneous TID WC  . insulin aspart  0-5 Units  Subcutaneous QHS  . insulin aspart  4 Units Subcutaneous TID WC  . Lidocaine  1 patch Apply externally Daily  . Melatonin  5 mg Oral QHS  . midodrine  5 mg Oral Daily  . multivitamin with minerals  1 tablet Oral Daily  . pantoprazole  40 mg Oral Daily  . polyvinyl alcohol  1 drop Both Eyes BID  . rosuvastatin  5 mg Oral QHS  . senna-docusate  2 tablet Oral QHS  . sevelamer carbonate  0.8 g Oral TID WC  . sodium chloride flush  3 mL Intravenous Q12H  . vitamin C  500 mg Oral Daily  . zinc sulfate  220 mg Oral Daily   Continuous Infusions: . sodium chloride      Procedures/Studies: Dg Chest Portable 1 View  Result Date: 10/06/2018 CLINICAL DATA:  Shortness of breath EXAM: PORTABLE CHEST 1 VIEW COMPARISON:  07/22/2017 FINDINGS: The lung volumes are low. The patient is status post prior median sternotomy. The heart size is enlarged. There are multifocal airspace opacities bilaterally, greatest within the left lower and left mid lung zone. There is no pneumothorax. No large pleural effusion. IMPRESSION: 1. Multifocal airspace opacities as detailed above which is nonspecific and can be seen in patients with multifocal pneumonia (viral or bacterial) or developing pulmonary edema. 2. Cardiomegaly. 3. Low lung volumes. Electronically Signed   By: Constance Holster M.D.   On: 10/29/2018 23:41    Orson Eva, DO  [facility] Pager [phone]  If 7PM-7AM, please contact night-coverage [email or web address] Password [email or web address] 10/17/2018, 7:29 AM   LOS: 0 days

## 2018-10-17 NOTE — Progress Notes (Signed)
Inpatient Diabetes Program Recommendations  AACE/ADA: New Consensus Statement on Inpatient Glycemic Control (2015)  Target Ranges:  Prepandial:   less than 140 mg/dL      Peak postprandial:   less than 180 mg/dL (1-2 hours)      Critically ill patients:  140 - 180 mg/dL   Lab Results  Component Value Date   GLUCAP 327 (H) 10/17/2018   HGBA1C 5.6 07/08/2016    Review of Glycemic Control  Diabetes history: DM2 Outpatient Diabetes medications: glipizide 2.5 mg QD, Prandin 0.5 mg tidwc Current orders for Inpatient glycemic control: Novolog 0-20 units tidwc and hs + 4 units tidwc. Received Lantus 20 units (1x dose) this am @ 0328  HgbA1C - will be drawn in am. COVID 19+. On Decadron 6 mg QD COVID 19 frequently increases insulin needs.  Inpatient Diabetes Program Recommendations:     Reduce Novolog to 0-9 units tidwc and 0-5 units QHS (pt is sensitive to insulin with ESRD. Increase Novolog to 6 units tidwc for meal coverage insulin - do not give if NPO or eating < 50% meals May need less Lantus than 20 units. 76.6kg x 0.2 = 15 units.  Will follow up on 7/20.  Thank you. Lorenda Peck, RD, LDN, CDE Inpatient Diabetes Coordinator [phone]

## 2018-10-18 ENCOUNTER — Encounter (HOSPITAL_COMMUNITY): Payer: Self-pay | Admitting: Certified Registered"

## 2018-10-18 ENCOUNTER — Encounter (HOSPITAL_COMMUNITY): Admission: EM | Disposition: E | Payer: Self-pay | Source: Home / Self Care | Attending: Internal Medicine

## 2018-10-18 ENCOUNTER — Ambulatory Visit (HOSPITAL_COMMUNITY)
Admission: RE | Admit: 2018-10-18 | Payer: Medicare (Managed Care) | Source: Home / Self Care | Admitting: Vascular Surgery

## 2018-10-18 ENCOUNTER — Inpatient Hospital Stay (HOSPITAL_COMMUNITY): Payer: Medicare (Managed Care)

## 2018-10-18 DIAGNOSIS — I5032 Chronic diastolic (congestive) heart failure: Secondary | ICD-10-CM

## 2018-10-18 DIAGNOSIS — Z951 Presence of aortocoronary bypass graft: Secondary | ICD-10-CM

## 2018-10-18 DIAGNOSIS — J069 Acute upper respiratory infection, unspecified: Secondary | ICD-10-CM

## 2018-10-18 DIAGNOSIS — Z7984 Long term (current) use of oral hypoglycemic drugs: Secondary | ICD-10-CM

## 2018-10-18 DIAGNOSIS — Z7982 Long term (current) use of aspirin: Secondary | ICD-10-CM

## 2018-10-18 DIAGNOSIS — J1289 Other viral pneumonia: Secondary | ICD-10-CM

## 2018-10-18 DIAGNOSIS — R451 Restlessness and agitation: Secondary | ICD-10-CM

## 2018-10-18 DIAGNOSIS — D631 Anemia in chronic kidney disease: Secondary | ICD-10-CM

## 2018-10-18 DIAGNOSIS — U071 COVID-19: Principal | ICD-10-CM

## 2018-10-18 DIAGNOSIS — Z79899 Other long term (current) drug therapy: Secondary | ICD-10-CM

## 2018-10-18 DIAGNOSIS — I249 Acute ischemic heart disease, unspecified: Secondary | ICD-10-CM

## 2018-10-18 DIAGNOSIS — I2510 Atherosclerotic heart disease of native coronary artery without angina pectoris: Secondary | ICD-10-CM

## 2018-10-18 DIAGNOSIS — E1122 Type 2 diabetes mellitus with diabetic chronic kidney disease: Secondary | ICD-10-CM

## 2018-10-18 DIAGNOSIS — J9601 Acute respiratory failure with hypoxia: Secondary | ICD-10-CM

## 2018-10-18 DIAGNOSIS — Z66 Do not resuscitate: Secondary | ICD-10-CM

## 2018-10-18 HISTORY — DX: Atherosclerotic heart disease of native coronary artery without angina pectoris: I25.10

## 2018-10-18 HISTORY — DX: Dyspnea, unspecified: R06.00

## 2018-10-18 LAB — C-REACTIVE PROTEIN: CRP: 15.3 mg/dL — ABNORMAL HIGH (ref <1.0)

## 2018-10-18 LAB — RENAL FUNCTION PANEL
Albumin: 2.3 g/dL — ABNORMAL LOW (ref 3.5–5.0)
Anion gap: 21 — ABNORMAL HIGH (ref 5–15)
BUN: 84 mg/dL — ABNORMAL HIGH (ref 8–23)
CO2: 22 mmol/L (ref 22–32)
Calcium: 8.8 mg/dL — ABNORMAL LOW (ref 8.9–10.3)
Chloride: 90 mmol/L — ABNORMAL LOW (ref 98–111)
Creatinine, Ser: 7.57 mg/dL — ABNORMAL HIGH (ref 0.44–1.00)
GFR calc Af Amer: 5 mL/min — ABNORMAL LOW (ref >60)
GFR calc non Af Amer: 4 mL/min — ABNORMAL LOW (ref >60)
Glucose, Bld: 305 mg/dL — ABNORMAL HIGH (ref 70–99)
Phosphorus: 4.3 mg/dL (ref 2.5–4.6)
Potassium: 4.6 mmol/L (ref 3.5–5.1)
Sodium: 133 mmol/L — ABNORMAL LOW (ref 135–145)

## 2018-10-18 LAB — LACTATE DEHYDROGENASE: LDH: 253 U/L — ABNORMAL HIGH (ref 98–192)

## 2018-10-18 LAB — BLOOD GAS, ARTERIAL
Acid-base deficit: 0.4 mmol/L (ref 0.0–2.0)
Bicarbonate: 24 mmol/L (ref 20.0–28.0)
Drawn by: 34560
FIO2: 100
O2 Content: 15 L/min
O2 Saturation: 81 %
Patient temperature: 98.6
pCO2 arterial: 41.2 mmHg (ref 32.0–48.0)
pH, Arterial: 7.382 (ref 7.350–7.450)
pO2, Arterial: 51.7 mmHg — ABNORMAL LOW (ref 83.0–108.0)

## 2018-10-18 LAB — D-DIMER, QUANTITATIVE: D-Dimer, Quant: 1.31 ug/mL-FEU — ABNORMAL HIGH (ref 0.00–0.50)

## 2018-10-18 LAB — GLUCOSE, CAPILLARY
Glucose-Capillary: 219 mg/dL — ABNORMAL HIGH (ref 70–99)
Glucose-Capillary: 185 mg/dL — ABNORMAL HIGH (ref 70–99)
Glucose-Capillary: 128 mg/dL — ABNORMAL HIGH (ref 70–99)
Glucose-Capillary: 318 mg/dL — ABNORMAL HIGH (ref 70–99)

## 2018-10-18 LAB — MRSA PCR SCREENING: MRSA by PCR: POSITIVE — AB

## 2018-10-18 LAB — FIBRINOGEN: Fibrinogen: 676 mg/dL — ABNORMAL HIGH (ref 210–475)

## 2018-10-18 LAB — FERRITIN: Ferritin: >7500 ng/mL — ABNORMAL HIGH (ref 11–307)

## 2018-10-18 LAB — LACTIC ACID, PLASMA: Lactic Acid, Venous: 2.6 mmol/L (ref 0.5–1.9)

## 2018-10-18 LAB — TROPONIN I (HIGH SENSITIVITY): Troponin I (High Sensitivity): 382 ng/L (ref <18)

## 2018-10-18 LAB — SEDIMENTATION RATE: Sed Rate: 84 mm/hr — ABNORMAL HIGH (ref 0–22)

## 2018-10-18 SURGERY — LIGATION OF ARTERIOVENOUS  FISTULA
Anesthesia: Choice | Laterality: Left

## 2018-10-18 MED ORDER — HALOPERIDOL LACTATE 5 MG/ML IJ SOLN
0.5000 mg | INTRAMUSCULAR | Status: DC | PRN
  Administered 2018-10-19 (×2): 0.5 mg via INTRAVENOUS
  Filled 2018-10-18 (×2): qty 1

## 2018-10-18 MED ORDER — MORPHINE BOLUS VIA INFUSION
2.0000 mg | INTRAVENOUS | Status: DC | PRN
  Administered 2018-10-18 – 2018-10-19 (×5): 2 mg via INTRAVENOUS
  Filled 2018-10-18: qty 2

## 2018-10-18 MED ORDER — LORAZEPAM 2 MG/ML IJ SOLN
1.0000 mg | INTRAMUSCULAR | Status: DC | PRN
  Administered 2018-10-18 – 2018-10-19 (×3): 1 mg via INTRAVENOUS
  Filled 2018-10-18 (×3): qty 1

## 2018-10-18 MED ORDER — HALOPERIDOL 0.5 MG PO TABS: 0.5000 mg | ORAL_TABLET | ORAL | Status: DC | PRN

## 2018-10-18 MED ORDER — LIDOCAINE 5 % EX PTCH
1.0000 | MEDICATED_PATCH | CUTANEOUS | Status: DC
  Administered 2018-10-19: 1 via TRANSDERMAL
  Filled 2018-10-18: qty 1

## 2018-10-18 MED ORDER — HYDROXYZINE HCL 25 MG PO TABS
25.0000 mg | ORAL_TABLET | Freq: Three times a day (TID) | ORAL | Status: DC | PRN
  Administered 2018-10-18: 25 mg via ORAL
  Filled 2018-10-18: qty 1

## 2018-10-18 MED ORDER — MORPHINE 100MG IN NS 100ML (1MG/ML) PREMIX INFUSION
1.0000 mg/h | INTRAVENOUS | Status: DC
  Administered 2018-10-18: 1 mg/h via INTRAVENOUS
  Filled 2018-10-18: qty 100

## 2018-10-18 MED ORDER — LORAZEPAM 2 MG/ML PO CONC: 1.0000 mg | ORAL | Status: DC | PRN

## 2018-10-18 MED ORDER — HALOPERIDOL LACTATE 2 MG/ML PO CONC
0.5000 mg | ORAL | Status: DC | PRN
  Filled 2018-10-18: qty 0.3

## 2018-10-18 MED ORDER — LORAZEPAM 1 MG PO TABS: 1.0000 mg | ORAL_TABLET | ORAL | Status: DC | PRN

## 2018-10-18 MED FILL — Lidocaine Patch 5%: CUTANEOUS | Qty: 1 | Status: AC

## 2018-10-18 NOTE — Progress Notes (Signed)
Renal Navigator notified OP HD clinics/[facility] and [facility] of patient's admission and positive COVID 19 test result in order to provide continuity of care and facilitate transfer from home clinic to isolation shift for positive COVID 19 patients. Upon discharge, patient will need to received OP HD treatment at [facility] clinic on a MWF schedule arriving at 5:45pm. She will be instructed to wait at the side door of the clinic until she is called in to the facility to ensure that all second shift patients have vacated the building. Renal Navigator will follow closely to help coordinate start date at isolation shift.  Alphonzo Cruise, [place] Renal Navigator [phone]

## 2018-10-18 NOTE — Progress Notes (Signed)
Paged by RN regarding worsening agitation. Evaluated [name] at bedside. Noted to be yelling out 'oh lord' loudly but unable to describe any pain or specific complaint. O2 sat in low 90s on 15 L re-breather. Disoriented and agitated. Had prolonged discussion with nephew, Raneem Mendolia, regarding [name]' worsening status and inability to provide more aggressive comfort measure while on full scope of care. Discussed transition to full comfort care to provide palliative support. Mr.Mcnorton requested that he has a chance to speak with [name] prior to transition. Was able to provide private moment for conversation via bedside phone before [name] started to become more agitated and altered. Mr.Todisco confirmed desire to proceed to full comfort care.  - Comfort care orders placed

## 2018-10-18 NOTE — Progress Notes (Signed)
   Patient was admitted with positive cover test and was planned for low left cephalic vein fistula ligation for steal.  Patient does have left hand pain per report.  Will need dialysis access while here.  Plan for tunneled dialysis catheter and ligation of fistula in the operating room tomorrow.  If there are acute issues I will see her prior otherwise to limit exposure I will evaluate her in the operating room.  Bryer Gottsch C. Donzetta Matters, MD [facility] Office: [phone] Pager: [phone]

## 2018-10-18 NOTE — Progress Notes (Signed)
Renal Navigator spoke with patient's home OP HD clinic/[facility] Worker/K. Parker to discuss how patient typically transports to and from HD clinic. Renal Navigator was informed that patient is a [facility] patient and they provide transportation.  Renal Navigator contacted [facility] at [phone] and was instructed to leave a message for Inter[employer] Transportation Director/Lucillia Marrow. Renal Navigator left message and requests call back as soon as possible to discuss situation and transportation need to [facility] for treatment at OP HD isolation shift due to positive COVID 19 status. Renal Navigator left message for CSW/D. Lovena Le to discuss patient's situation also.  Alphonzo Cruise, [place] Renal Navigator [phone]

## 2018-10-18 NOTE — Consult Note (Signed)
[facility]    NEPHROLOGY CONSULTATION NOTE  PATIENT ID:  [name], DOB:  [birth year]  HPI: The patient is a 85 y.o. year old female patient with a past medical history significant for end-stage renal disease on hemodialysis Tuesday, Thursday, Saturday [facility] who presents with acute hypoxic respiratory failure secondary to COVID.  She dialyzes via a left brachiocephalic AV fistula.  Her last treatment was on 10/08/2018.  She presented with complaints of shortness of breath that had worsened for the past 24 hours.  She had oxygen saturations of 75 on room air on admission.  Renal consultation has been called for end-stage renal disease management.  Past Medical History:  Diagnosis Date  . Absent pedal pulses   . Anemia of chronic renal failure 10/05/2010   Nice response to Procrit   . Anemia, iron deficiency 08/18/2012  . Ataxia involving legs    has had several falls  . CAD (coronary artery disease), native coronary artery 07/05/2016   3VD by cath 2008  01/22/07 CABG with LIMA to LAD< SVG to dx, SVG to OM1-2, SVG to PDA Dr. Roxan Hockey  . Chronic diastolic heart failure ([place]) 05/23/2014  . Coronary artery disease   . Dementia ([place])    "mild" per MD note  . Diabetes mellitus   . Dyspnea   . ESRD on hemodialysis ([place])   . Family history of anesthesia complication    Nephew- had time waking  . Hyperlipidemia   . Hypertension   . Hypertensive heart disease without CHF 11/10/2013  . Neuropathy   . Osteoarthritis 01/01/2013  . Type 2 diabetes mellitus with renal manifestations ([place]) 02/01/2014    Past Surgical History:  Procedure Laterality Date  . A/V FISTULAGRAM Left 06/02/2018   Procedure: A/V FISTULAGRAM;  Surgeon: [name], MD;  Location: [place] [facility];  Service: Cardiovascular;  Laterality: Left;  . CATARACT EXTRACTION     cataract  . CORONARY ARTERY BYPASS GRAFT  2008  . EYE SURGERY    . FEMUR FRACTURE SURGERY    . FEMUR  IM NAIL Left 07/05/2016   Procedure: INTRAMEDULLARY (IM) RETROGRADE FEMORAL NAILING LEFT;  Surgeon: Marybelle Killings, MD;  Location: [place];  Service: Orthopedics;  Laterality: Left;  . INSERTION OF DIALYSIS CATHETER Left 09/05/2013   Procedure: INSERTION OF DIALYSIS CATHETER;  Surgeon: Conrad Butte, MD;  Location: [place];  Service: Vascular;  Laterality: Left;  . IR DIALY SHUNT INTRO [place] W/IMG LEFT Left 09/15/2016  . IR DIALY SHUNT INTRO NEEDLE/INTRACATH INITIAL W/IMG LEFT Left 04/08/2017  . IR DIALY SHUNT INTRO NEEDLE/INTRACATH INITIAL W/IMG LEFT Left 03/09/2018  . IR US GUIDE VASC ACCESS LEFT  03/09/2018  . PERIPHERAL VASCULAR INTERVENTION Left 06/02/2018   Procedure: PERIPHERAL VASCULAR INTERVENTION;  Surgeon: [name], MD;  Location: [place] [facility];  Service: Cardiovascular;  Laterality: Left;    Family History  Problem Relation Age of Onset  . Diabetes Father   . Cancer Sister   . Aneurysm Mother     Social History   Tobacco Use  . Smoking status: Never Smoker  . Smokeless tobacco: Never Used  Substance Use Topics  . Alcohol use: Not Currently  . Drug use: No    REVIEW OF SYSTEMS: Face-to-face physical examination was not performed d/t COVID-19 pandemic in effort to preserve PPE and avoid infecting other providers. Detailed review of the medical record, direct communication with primary team, and thorough accounting of tests  and studies has been performed.    PHYSICAL EXAM:  Vitals:   10/27/2018 0841 10/01/2018 1153  BP: 140/61 (!) 148/79  Pulse: 82 94  Resp: (!) 26 (!) 25  Temp: 97.7 F (36.5 C) 97.8 F (36.6 C)  SpO2: 100% 91%   I/O last 3 completed shifts: In: 6 [P.O.:237; IV Piggyback:250] Out: 0    Face-to-face physical examination was not performed d/t COVID-19 pandemic in effort to preserve PPE and avoid infecting other providers.  Detailed review of the medical record, direct communication with primary team, and thorough accounting of  tests and studies has been performed.  Per Dr. Venia Minks note, decreased pulse in the left radial, fingers and hands are swollen, tender and cold to the touch.    CURRENT MEDICATIONS:  . albuterol  2 puff Inhalation Q6H  . antiseptic oral rinse  15 mL Mouth Rinse 6 X Daily  . aspirin  81 mg Oral Daily  . dexamethasone (DECADRON) injection  6 mg Intravenous Daily  . donepezil  10 mg Oral QHS  . feeding supplement (NEPRO CARB STEADY)  237 mL Oral BID BM  . fluticasone  1 spray Each Nare Daily  . guaiFENesin  600 mg Oral BID  . heparin  5,000 Units Subcutaneous Q8H  . insulin aspart  0-20 Units Subcutaneous TID WC  . insulin aspart  0-5 Units Subcutaneous QHS  . insulin aspart  4 Units Subcutaneous TID WC  . Lidocaine  1 patch Apply externally Daily  . Melatonin  3 mg Oral QHS  . midodrine  5 mg Oral Daily  . multivitamin with minerals  1 tablet Oral Daily  . pantoprazole  40 mg Oral Daily  . polyvinyl alcohol  1 drop Both Eyes BID  . rosuvastatin  5 mg Oral QHS  . senna-docusate  2 tablet Oral QHS  . sevelamer carbonate  0.8 g Oral TID WC  . sodium chloride flush  3 mL Intravenous Q12H  . vitamin C  500 mg Oral Daily  . zinc sulfate  220 mg Oral Daily     HOME MEDICATIONS:  Prior to Admission medications   Medication Sig Start Date End Date Taking? Authorizing Provider  acetaminophen (TYLENOL) 650 MG CR tablet Take 1,300 mg by mouth 2 (two) times a day.   Yes [provider]  antiseptic oral rinse (BIOTENE) LIQD 15 mLs by Mouth Rinse route 6 (six) times daily.   Yes [provider]  aspirin 81 MG chewable tablet Chew 81 mg by mouth daily.    Yes [provider]  donepezil (ARICEPT) 10 MG tablet Take 10 mg by mouth at bedtime. For MEMORY    Yes [provider]  doxycycline (VIBRAMYCIN) 100 MG capsule Take 100 mg by mouth 2 (two) times daily. For 7 days - completes on 10/21/2018   Yes [provider]  Emollient Letitia Caul) LOTN Apply 1  application topically 4 (four) times daily as needed (dry skin).   Yes [provider]  fluticasone (FLONASE) 50 MCG/ACT nasal spray Place 1 spray into both nostrils daily.   Yes [provider]  folic acid-vitamin b complex-vitamin c-selenium-zinc (DIALYVITE) 3 MG TABS tablet Take 1 tablet by mouth daily.   Yes [provider]  glipiZIDE (GLUCOTROL XL) 2.5 MG 24 hr tablet Take 2.5 mg by mouth daily.   Yes [provider]  Lidocaine 4 % PTCH Apply 1 patch topically daily. Apply to left knee   Yes [provider]  lidocaine-prilocaine (EMLA)  cream Apply 1 application topically as needed (port access on dialysis days (T-TH-SAT)).    Yes [provider]  loperamide (IMODIUM A-D) 2 MG tablet Take 2 mg by mouth daily as needed for diarrhea or loose stools.    Yes [provider]  Melatonin ER 5 MG TBCR Take 5 mg by mouth at bedtime.   Yes [provider]  midodrine (PROAMATINE) 5 MG tablet Take 5 mg by mouth daily.   Yes [provider]  Nutritional Supplements (FEEDING SUPPLEMENT, NEPRO CARB STEADY,) LIQD Take 237 mLs by mouth 2 (two) times daily between meals. Patient taking differently: Take 237 mLs by mouth 3 (three) times daily between meals.  07/09/16  Yes Rai, Ripudeep K, MD  pantoprazole (PROTONIX) 40 MG tablet Take 40 mg by mouth daily.   Yes [provider]  Propylene Glycol (SYSTANE BALANCE) 0.6 % SOLN Place 1 drop into both eyes 2 (two) times daily.    Yes [provider]  repaglinide (PRANDIN) 1 MG tablet Take 0.5 mg by mouth daily.    Yes [provider]  rosuvastatin (CRESTOR) 5 MG tablet Take 5 mg by mouth at bedtime. For CHOLESTEROL   Yes [provider]  sevelamer carbonate (RENVELA) 0.8 g PACK packet Take 0.8 g by mouth 3 (three) times daily with meals.   Yes [provider]       LABS:  CBC Latest Ref Rng & Units 10/24/2018 06/02/2018 03/09/2018  WBC 4.0 -  10.5 K/uL 6.8 - 7.2  Hemoglobin 12.0 - 15.0 g/dL 9.1(L) 12.2 10.7(L)  Hematocrit 36.0 - 46.0 % 29.3(L) 36.0 34.4(L)  Platelets 150 - 400 K/uL 144(L) - 159    CMP Latest Ref Rng & Units 10/19/2018 06/02/2018 03/09/2018  Glucose 70 - 99 mg/dL 569(HH) 251(H) 152(H)  BUN 8 - 23 mg/dL 34(H) - 83(H)  Creatinine 0.44 - 1.00 mg/dL 4.27(H) - 11.24(H)  Sodium 135 - 145 mmol/L 132(L) 137 136  Potassium 3.5 - 5.1 mmol/L 3.6 3.3(L) 4.3  Chloride 98 - 111 mmol/L 92(L) - 91(L)  CO2 22 - 32 mmol/L 26 - 24  Calcium 8.9 - 10.3 mg/dL 8.1(L) - 9.1  Total Protein 6.5 - 8.1 g/dL 6.7 - -  Total Bilirubin 0.3 - 1.2 mg/dL 1.0 - -  Alkaline Phos 38 - 126 U/L 99 - -  AST 15 - 41 U/L 30 - -  ALT 0 - 44 U/L 19 - -    Lab Results  Component Value Date   PTH 355.9 (H) 08/24/2013   CALCIUM 8.1 (L) 10/03/2018   PHOS 4.1 07/10/2016       Component Value Date/Time   COLORURINE AMBER (A) 08/18/2016 2210   APPEARANCEUR CLOUDY (A) 08/18/2016 2210   APPEARANCEUR Clear 10/12/2013 1254   LABSPEC 1.018 08/18/2016 2210   LABSPEC 1.004 10/12/2013 1254   PHURINE 7.0 08/18/2016 2210   GLUCOSEU NEGATIVE 08/18/2016 2210   GLUCOSEU Negative 10/12/2013 1254   HGBUR SMALL (A) 08/18/2016 2210   BILIRUBINUR NEGATIVE 08/18/2016 2210   BILIRUBINUR Negative 10/12/2013 1254   KETONESUR NEGATIVE 08/18/2016 2210   PROTEINUR 30 (A) 08/18/2016 2210   UROBILINOGEN 0.2 08/24/2013 1454   NITRITE NEGATIVE 08/18/2016 2210   LEUKOCYTESUR TRACE (A) 08/18/2016 2210   LEUKOCYTESUR Negative 10/12/2013 1254      Component Value Date/Time   PHART 7.397 10/17/2018 0004   PCO2ART 49.5 (H) 10/17/2018 0004   PO2ART 57.1 (L) 10/17/2018 0004   HCO3 28.3 (H) 10/17/2018 0004   TCO2  23 01/23/2007 1801   ACIDBASEDEF 3.0 (H) 01/23/2007 1801   O2SAT 87.2 10/17/2018 0004       Component Value Date/Time   IRON 31 (L) 08/26/2013 0521   TIBC 242 (L) 08/26/2013 0521   FERRITIN >7,500 (H) 10/28/2018 0759   IRONPCTSAT 13 (L) 08/26/2013 0521        ASSESSMENT/PLAN:     1.  End-stage renal disease on hemodialysis.  We will continue her usual hemodialysis schedule on Tuesday, Thursday, and Saturday.  Euvolemic per hospitalist note.  Has a functioning left upper extremity AV fistula.  Will need to transition to a Monday, Wednesday, Friday dialysis seat as an outpatient at a different dialysis unit due to COVID status.  2.  Acute hypoxemic respiratory failure due to COVID-19.  Continues on dexamethasone.  Remdesivir started.  Continues with an oxygen requirement.  3.  Vascular access.  Patient was scheduled to have surgery on fistula for possible steal syndrome.  Will ask vascular to evaluate.  4.  Metabolic Bone Disease.  Continue outpatient management.  Check phosphorus tomorrow with dialysis.  5.  Anemia of chronic kidney disease.  Ferritin markedly elevated likely secondary to acute infection.  ESA will not likely be effective in the setting of acute infection.  Will monitor hemoglobin.      [place], DO, [place]

## 2018-10-18 NOTE — Significant Event (Addendum)
Rapid Response Event Note  Overview: Time Called: 1905 Arrival Time: 1910 Event Type: Respiratory(Acute Desat to 55s)  Initial Focused Assessment: Called by Nursing staff regarding Covid + pt in acute respiratory distress with sats in the 60s on 6L HFNC. Pt placed on NRB mask at 15L. Upon my arrival, [name] is awake, anxious and moaning.  HR 120s ST, 178/80 (108), RR 26 with sats now 96% on 15L NRB mask. She is moaning and using abdominal accessory muscles. BBS coarse rales on the R throughout and rhonchi on the Left. Skin is pink, warm and diaphoretic. She is oriented to situation. When I asked her if she was to stop breathing would she want a tube placed into her throat and placed on a machine to help her breathe, she said "no". I also asked in the event that her heart was to stop would she want [place] to perform CPR, she said "No, I've been through enough of that stuff." Dr. Truman Hayward and Dr. Gilford Rile came to bedside to evaluate patient and consulted PCCM.   Interventions: -NRB mask -ABG (after redraw-7.38/41.2/51.7/25.2) -Stat PCXR  Plan of Care (if not transferred): -IMTS at bedside and consulting PCCM -May transfer to ICU for intubation -May re-evaluate [place] and code status  Event Summary: Name of Physician Notified: Dr. Truman Hayward at [place]  Name of Consulting Physician Notified: PCCM at 1948  Outcome: Stayed in room and stabalized  Event End Time: 1950  Madelynn Done

## 2018-10-18 NOTE — Progress Notes (Signed)
Discussed with critical care and with internal medicine teaching service resident.  Worsening respiratory status  Stable but now on NRB   covid pneumonia  Seen by rapid response   CXR  Interval worsening of bilateral airspace densities  Lactate 2.6   Ph 7.38   pCO 2   41.2  p O 2   51.7      Previous  p O 2   57 . 1   7/19  Increased oxygen demand could conceivably be secondary BP 128/64   Pulse 96   Temp 98    Last sats  Increased to 95%  No dialysis access   Patient now comfortably   DNR    Family informed and do not wish to excalate care to ICU and emergent  placement of dialysis catheter .   Will continue to monitor and plan for catheter placement in morning with regular intermittent dialysis

## 2018-10-18 NOTE — Progress Notes (Signed)
  Date: 10/25/2018  Patient name: [name]  Medical record number: [id number]  Date of birth: 03/22/1931   Subjective: Ms. Welden reports that she feels better than yesterday.  She notes that her SOB is better, but her hand pain continues to be an issue.  She has tenderness to palpation and swelling of that hand.  She is hopeful her surgery will not be delayed.    Objective:  Vital signs in last 24 hours: Vitals:   10/14/2018 0500 09/29/2018 0600 10/16/2018 0841 10/25/2018 1153  BP:   140/61 (!) 148/79  Pulse: 63 85 82 94  Resp: 14 (!) 21 (!) 26 (!) 25  Temp:   97.7 F (36.5 C) 97.8 F (36.6 C)  TempSrc:   Oral Oral  SpO2: 93% 99% 100% 91%  Weight:      Height:       General: Elderly woman, lying in bed, mildly tachypneic Eyes: Anicteric sclerae, no injection HENT: Neck is supple, [place] in place at 4L CV: RR, NR, no murmur Pulm: Difficult to appreciate due to ambient noise in the room, no wheezing, L sided crackles > right at the bases Abd: Soft, +BS, NT MSK: Normal muscle tone for age Vascular: Decreased pulse in the left radial, fingers and hand are swollen, tender and cold to the touch.  Fistula in Left upper arm has a palpable thrill Psych: Appears fatigued, alert, normal mood and behavior  Inflammatory markers remain elevated, but are relatively stable  Assessment/Plan:  Acute Respiratory failure with hypoxia due to COVID -19 Infection Multifocal Pneumonia due to COVID-19 virus - Stable on 4L [place], wean as tolerated - On airborne and contact precautions - Inflammatory markers elevated, will check these again tomorrow 10/18/2018 - Continue dexamethasone 6 mg/day - Continue Remdesivir for 5 days total (day 2 of 5 Monday 7/20)  ESRD on hemodialysis Suspect Steal Syndrome, AVF dysfunction - Was scheduled for outpatient procedure today, unclear by chart review if still scheduled - She is having decreased pulses and would benefit from surgery  - Discuss with vascular  surgery - Complicated by [id number] status - Last ESRD Saturday, due tomorrow - Consult Renal if not done already (renal SW has seen) - Tylenol and oxycodone for pain - Continue midodrine - Continue sevelamer   Anemia of chronic renal failure - Stable on admission around 9, monitor    CAD/Hx of CABG - Troponin elevated, likely demand, she has no chest pain - Trend - Continue aspirin    Type 2 diabetes mellitus with renal manifestations  - Hold oral medications at this time - SSI and 4 units with meals  Chronic diastolic heart failure  - Appears euvolemic - ESRD, continue HD   Dispo: Anticipated discharge in approximately 2-4 day(s).     Sid Falcon, MD 10/04/2018, 12:58 PM

## 2018-10-18 NOTE — Progress Notes (Addendum)
Paged by RN regarding acute respiratory distress. Evaluated Mrs.Calvario at bedside with rapid response nurse. She was on 15L rebreather and was noted to be significantly agitated, yelling out in distress stating she could not breath. She denied any chest pain, palpitations, sputum. Nursing at bedside also mentions that his change was acute.  Gen: Chronically ill-appearing, In distress CV: Tachycardic, S1, S2 normal Pulm: Coarse breath sounds, diminished on LLL, Bibasilar rales Extm: ROM intact, Peripheral pulses intact, Trace peripheral edema Skin: Dry, Warm  Worsening Respiratory Distress 2/2 worsening COVID-19 infection vs pulmonary edema Oxygen requirement at 15L, Agitated but oriented. Currently not wishing intubation but full scope of care. Discussed case with on-call attending. Concerning for pulmonary edema due to ESRD (last session 7/18). Currently does not have working access due to AVF dysfunction. Would need central line placement and transfer to ICU for CRRT if she needs dialysis. - ABG, Chest X-ray - Pulm consult - Will reach out to nephro for evaluation for urgent dialysis   Addendum: Re-evaluated pt at bedside with PCCM. Discussion with patient and family resulted in decision to not pursue aggressive intervention and changing code status to DNR, DNI. Nephro recommends supportive care overnight and routine HD in the morning.

## 2018-10-18 NOTE — Progress Notes (Signed)
Inpatient Diabetes Program Recommendations  AACE/ADA: New Consensus Statement on Inpatient Glycemic Control  Target Ranges:  Prepandial:   less than 140 mg/dL      Peak postprandial:   less than 180 mg/dL (1-2 hours)      Critically ill patients:  140 - 180 mg/dL  Results for [name], [name] (MRN [id number]) as of 10/03/2018 09:56  Ref. Range 10/17/2018 08:41 10/17/2018 12:25 10/17/2018 17:48 10/17/2018 23:39 10/15/2018 08:35  Glucose-Capillary Latest Ref Range: 70 - 99 mg/dL 327 (H) 330 (H) 165 (H) 131 (H) 219 (H)    Review of Glycemic Control  Diabetes history: DM2 Outpatient Diabetes medications: Glipizide XL 2.5 mg daily, Prandin 0.5 mg daily Current orders for Inpatient glycemic control: Novolog 0-20 units TID with meals, Novolog 0-5 units QHS, Novolog 4 units TID with meals for meal coverage; Decadron 6 mg daily  Inpatient Diabetes Program Recommendations:  Insulin - Basal: Noted patient received Lantus 20 units at 3:28 am on 10/17/18 and fasting glucose 217 mg/dl today. If Decadron is continued, please consider ordering Lantus 15 units Q24H.  Thanks, Barnie Alderman, RN, MSN, CDE Diabetes Coordinator Inpatient Diabetes Program [phone] (Team Pager from 8am to 5pm)

## 2018-10-18 NOTE — Plan of Care (Signed)
Received from [facility] via EMS, transport did not receive verbal report from nurse Ginger. Abstract report received from EMS transport. Phoned nurse at [facility] ED to attempt to obtain a more thorough report from Nurse [place], report received still very vague , even with Probation officer asking specific questions. Pt stable upon arrival alert and oriented x4, no c/o pain or discomfort. O2 sats >92% with 5-6 L of O2 via HFNC. Will continue to monitor. VSS, afebrile at this time.

## 2018-10-18 NOTE — Progress Notes (Signed)
1850 Patient's saturations in the 60s and was then placed on a NRB. Patient continue to fidget in bed and moaning. Patient positioned upright in bed and was noticed to be diaphoretic on her back. Unable to get oral temp on patient and obtained a axillary temp of 98.0. PRN tylenol given at 1902. Rapid Response called at 1905 to come assist and assess patient.

## 2018-10-18 NOTE — Progress Notes (Signed)
Paged started to get restless and agitated  PRN  Vistaril given at 25 mg tab at 1709. Pt restless continued Pt started to yell "Let me go" and becoming increasingly restless at 1830 Attempted to reach out to IMTS multiple pages sent Have yet to received a call back

## 2018-10-18 NOTE — Consult Note (Signed)
NAME:  [name], MRN:  [id number], DOB:  07/14/1930, LOS: 1 ADMISSION DATE:  10/22/2018, CONSULTATION DATE:  10/09/2018 REFERRING MD:  Daryll Drown  CHIEF COMPLAINT:  SOB   Brief History   [name] is a 85 y.o. female who was admitted 7/19 for dyspnea and hypoxic respiratory failure secondary to COVID-19 PNA.  During the evening of 7/20, she had desaturations to the 60's and required NRB.  PCCM asked to evaluate.  During our evaluation, code status changed to DNR and confirmed with pt and nephew.  History of present illness   [name] is a 85 y.o. female who has a PMH including but not limited to ESRD on HD TTS - last full treatment 10/28/2018 (see "past medical history" for rest). She resides at [facility].  She presented to [facility] 7/18 with SOB and hypoxia and was found to be COVID positive.  She was transferred to [facility] for ongoing care and was to be evaluated by vascular surgery for possible steal syndrome of LUE AVF.  Evening of 7/20, had desaturations to the 60's; therefore, PCCM consulted.  Prior to our evaluation, rapid response had seen pt and pt informed them that she did not wish for any heroic measures including wishes to not ever be placed on a ventilator or have chest compressions.  During my evaluation, I confirmed these wishes with pt and confirmed full DNR status.  Discussed with nephew over the phone who was in agreement.  Past Medical History  ESRD on HD TTS, DM, HTN, HLD, CAD, Dementia.  Significant Hospital Events   7/19 > admitted. 7/20 > PCCM consult.  Consults:  Nephrology. Vascular. PCCM.  Procedures:  None.  Significant Diagnostic Tests:  CXR 7/20 > bilateral airspace opacities L > R.  Micro Data:  Blood 7/18 >  SARS CoV2 7/18 > positive.  Antimicrobials:  None.   Interim history/subjective:  On NRB, sats ranging from mid 80's to mid 90's.  Speaking in full sentences, no accessory muscle use. States "I'm hanging on, but lord I've been  through a lot".  Objective:  Blood pressure (!) 162/84, pulse 94, temperature 98 F (36.7 C), temperature source Axillary, resp. rate (!) 28, height 5\' 6"  (1.676 m), weight 80.9 kg, SpO2 91 %.        Intake/Output Summary (Last 24 hours) at 10/09/2018 2115 Last data filed at 09/30/2018 0100 Gross per 24 hour  Intake 487 ml  Output 0 ml  Net 487 ml   Filed Weights   10/11/2018 2251 10/17/18 2100  Weight: 76.6 kg 80.9 kg    Examination: General: Elderly female, in NAD. Neuro: A&O x 3, no deficits. HEENT: [place]/AT. Sclerae anicteric.  Arcus senilis. Cardiovascular: RRR, no M/R/G.  Lungs: Respirations even and unlabored.  Coarse in bases. Abdomen: BS x 4, soft, NT/ND.  Musculoskeletal: No gross deformities, no edema.  Skin: Intact, warm, no rashes.  Assessment & Plan:   Acute hypoxic respiratory failure - 2/2 COVID-19 PNA. - Continue supplemental O2 as needed to maintain SpO2 > 92%. - Continue decadron, vitamin C, zinc. - Consider d/c remdesivir given ESRD status. - Self proning if she will tolerate. - Volume removal per HD. - If deteriorates or develops distress, would recommend morphine for comfort and transition to full comfort care.  ESRD on HD TTS. - Discussed with nephrology, no role for CRRT overnight.  If stable tomorrow AM, then can plan for routine HD (might need temp catheter).  ? Steal syndrome. -  Per vascular.  DM. - Continue SSI.  Rest per primary team.  Nothing further to add.  PCCM will sign off.  Please do not hesitate to call us back if we can be of any further assistance.   Best Practice:  Diet: NPO. Pain/Anxiety/Delirium protocol (if indicated): N/A. VAP protocol (if indicated): N/A. DVT prophylaxis: SCD's / Heparin. GI prophylaxis: None. Glucose control: SSI. Mobility: Bedrest. Code Status: DNR. Family Communication: Updated nephew over the phone.  Confirmed DNR status. Disposition: Progressive.  Labs   CBC: Recent Labs  Lab 10/24/2018 2300   WBC 6.8  NEUTROABS 6.1  HGB 9.1*  HCT 29.3*  MCV 96.1  PLT 213*   Basic Metabolic Panel: Recent Labs  Lab 10/29/2018 2300  NA 132*  K 3.6  CL 92*  CO2 26  GLUCOSE 569*  BUN 34*  CREATININE 4.27*  CALCIUM 8.1*   GFR: Estimated Creatinine Clearance: 10.3 mL/min (A) (by C-G formula based on SCr of 4.27 mg/dL (H)). Recent Labs  Lab 10/24/2018 2300 10/17/18 0127  PROCALCITON  --  5.57  WBC 6.8  --   LATICACIDVEN 3.0* 2.4*   Liver Function Tests: Recent Labs  Lab 10/26/2018 2300  AST 30  ALT 19  ALKPHOS 99  BILITOT 1.0  PROT 6.7  ALBUMIN 2.7*   Recent Labs  Lab 10/28/2018 2310  LIPASE 157*   No results for input(s): AMMONIA in the last 168 hours. ABG    Component Value Date/Time   PHART 7.382 10/04/2018 2016   PCO2ART 41.2 10/17/2018 2016   PO2ART 51.7 (L) 10/06/2018 2016   HCO3 24.0 10/07/2018 2016   TCO2 23 01/23/2007 1801   ACIDBASEDEF 0.4 10/09/2018 2016   O2SAT 81.0 10/28/2018 2016    Coagulation Profile: Recent Labs  Lab 10/08/2018 2300  INR 1.1   Cardiac Enzymes: No results for input(s): CKTOTAL, CKMB, CKMBINDEX, TROPONINI in the last 168 hours. HbA1C: Hgb A1c MFr Bld  Date/Time Value Ref Range Status  07/08/2016 06:38 AM 5.6 4.8 - 5.6 % Final    Comment:    (NOTE)         Pre-diabetes: 5.7 - 6.4         Diabetes: >6.4         Glycemic control for adults with diabetes: <7.0   08/26/2013 05:21 AM 6.4 (H) <5.7 % Final    Comment:    (NOTE)                                                                       According to the ADA Clinical Practice Recommendations for 2011, when HbA1c is used as a screening test:  >=6.5%   Diagnostic of Diabetes Mellitus           (if abnormal result is confirmed) 5.7-6.4%   Increased risk of developing Diabetes Mellitus References:Diagnosis and Classification of Diabetes Mellitus,Diabetes YQMV,7846,96(EXBMW 1):S62-S69 and Standards of Medical Care in         Diabetes - 2011,Diabetes UXLK,4401,02 (Suppl  1):S11-S61.   CBG: Recent Labs  Lab 10/17/18 1748 10/17/18 2339 10/21/2018 0835 09/29/2018 1119 10/18/18 1704  GLUCAP 165* 131* 219* 185* 128*    Review of Systems:   All negative; except for those that are bolded, which indicate  positives.  Constitutional: weight loss, weight gain, night sweats, fevers, chills, fatigue, weakness.  HEENT: headaches, sore throat, sneezing, nasal congestion, post nasal drip, difficulty swallowing, tooth/dental problems, visual complaints, visual changes, ear aches. Neuro: difficulty with speech, weakness, numbness, ataxia. CV:  chest pain, orthopnea, PND, swelling in lower extremities, dizziness, palpitations, syncope.  Resp: cough, hemoptysis, dyspnea, wheezing. GI: heartburn, indigestion, abdominal pain, nausea, vomiting, diarrhea, constipation, change in bowel habits, loss of appetite, hematemesis, melena, hematochezia.  GU: dysuria, change in color of urine, urgency or frequency, flank pain, hematuria. MSK: joint pain or swelling, decreased range of motion. Psych: change in mood or affect, depression, anxiety, suicidal ideations, homicidal ideations. Skin: rash, itching, bruising.   Past medical history  She,  has a past medical history of Absent pedal pulses, Anemia of chronic renal failure (10/05/2010), Anemia, iron deficiency (08/18/2012), Ataxia involving legs, CAD (coronary artery disease), native coronary artery (07/05/2016), Chronic diastolic heart failure ([place]) (05/23/2014), Coronary artery disease, Dementia ([place]), Diabetes mellitus, Dyspnea, ESRD on hemodialysis ([place]), Family history of anesthesia complication, Hyperlipidemia, Hypertension, Hypertensive heart disease without CHF (11/10/2013), Neuropathy, Osteoarthritis (01/01/2013), and Type 2 diabetes mellitus with renal manifestations ([place]) (02/01/2014).   Surgical History    Past Surgical History:  Procedure Laterality Date  . A/V FISTULAGRAM Left 06/02/2018   Procedure: A/V FISTULAGRAM;  Surgeon:  [name], MD;  Location: [facility];  Service: Cardiovascular;  Laterality: Left;  . CATARACT EXTRACTION     cataract  . CORONARY ARTERY BYPASS GRAFT  2008  . EYE SURGERY    . FEMUR FRACTURE SURGERY    . FEMUR IM NAIL Left 07/05/2016   Procedure: INTRAMEDULLARY (IM) RETROGRADE FEMORAL NAILING LEFT;  Surgeon: Marybelle Killings, MD;  Location: [facility];  Service: Orthopedics;  Laterality: Left;  . INSERTION OF DIALYSIS CATHETER Left 09/05/2013   Procedure: INSERTION OF DIALYSIS CATHETER;  Surgeon: [employer], MD;  Location: [facility];  Service: Vascular;  Laterality: Left;  . IR DIALY SHUNT INTRO [place] W/IMG LEFT Left 09/15/2016  . IR DIALY SHUNT INTRO NEEDLE/INTRACATH INITIAL W/IMG LEFT Left 04/08/2017  . IR DIALY SHUNT INTRO NEEDLE/INTRACATH INITIAL W/IMG LEFT Left 03/09/2018  . IR US GUIDE VASC ACCESS LEFT  03/09/2018  . PERIPHERAL VASCULAR INTERVENTION Left 06/02/2018   Procedure: PERIPHERAL VASCULAR INTERVENTION;  Surgeon: [name], MD;  Location: [facility];  Service: Cardiovascular;  Laterality: Left;     Social History   reports that she has never smoked. She has never used smokeless tobacco. She reports previous alcohol use. She reports that she does not use drugs.   Family history   Her family history includes Aneurysm in her mother; Cancer in her sister; Diabetes in her father.   Allergies No Known Allergies   Home meds  Prior to Admission medications   Medication Sig Start Date End Date Taking? Authorizing Provider  acetaminophen (TYLENOL) 650 MG CR tablet Take 1,300 mg by mouth 2 (two) times a day.   Yes [provider]  antiseptic oral rinse (BIOTENE) LIQD 15 mLs by Mouth Rinse route 6 (six) times daily.   Yes [provider]  aspirin 81 MG chewable tablet Chew 81 mg by mouth daily.    Yes [provider]  donepezil (ARICEPT) 10 MG tablet Take 10 mg by mouth at bedtime. For MEMORY    Yes [provider]  doxycycline (VIBRAMYCIN) 100 MG capsule Take 100 mg by mouth 2 (two) times daily. For 7 days -  completes on 10/21/2018   Yes [provider]  Emollient Letitia Caul) LOTN Apply 1 application topically 4 (four) times daily as needed (dry skin).   Yes [provider]  fluticasone (FLONASE) 50 MCG/ACT nasal spray Place 1 spray into both nostrils daily.   Yes [provider]  folic acid-vitamin b complex-vitamin c-selenium-zinc (DIALYVITE) 3 MG TABS tablet Take 1 tablet by mouth daily.   Yes [provider]  glipiZIDE (GLUCOTROL XL) 2.5 MG 24 hr tablet Take 2.5 mg by mouth daily.   Yes [provider]  Lidocaine 4 % PTCH Apply 1 patch topically daily. Apply to left knee   Yes [provider]  lidocaine-prilocaine (EMLA) cream Apply 1 application topically as needed (port access on dialysis days (T-TH-SAT)).    Yes [provider]  loperamide (IMODIUM A-D) 2 MG tablet Take 2 mg by mouth daily as needed for diarrhea or loose stools.    Yes [provider]  Melatonin ER 5 MG TBCR Take 5 mg by mouth at bedtime.   Yes [provider]  midodrine (PROAMATINE) 5 MG tablet Take 5 mg by mouth daily.   Yes [provider]  Nutritional Supplements (FEEDING SUPPLEMENT, NEPRO CARB STEADY,) LIQD Take 237 mLs by mouth 2 (two) times daily between meals. Patient taking differently: Take 237 mLs by mouth 3 (three) times daily between meals.  07/09/16  Yes Rai, Ripudeep K, MD  pantoprazole (PROTONIX) 40 MG tablet Take 40 mg by mouth daily.   Yes [provider]  Propylene Glycol (SYSTANE BALANCE) 0.6 % SOLN Place 1 drop into both eyes 2 (two) times daily.    Yes [provider]  repaglinide (PRANDIN) 1 MG tablet Take 0.5 mg by mouth daily.    Yes [provider]  rosuvastatin (CRESTOR) 5 MG tablet Take 5 mg by mouth at bedtime. For CHOLESTEROL   Yes [provider]  sevelamer carbonate  (RENVELA) 0.8 g PACK packet Take 0.8 g by mouth 3 (three) times daily with meals.   Yes [provider]    Montey Hora, [place] Pager: [phone].  If no answer, [phone] 10/04/2018, 9:15 PM

## 2018-10-19 ENCOUNTER — Encounter (HOSPITAL_COMMUNITY): Admission: EM | Disposition: E | Payer: Self-pay | Source: Home / Self Care | Attending: Internal Medicine

## 2018-10-19 DIAGNOSIS — N186 End stage renal disease: Secondary | ICD-10-CM

## 2018-10-19 DIAGNOSIS — G9340 Encephalopathy, unspecified: Secondary | ICD-10-CM

## 2018-10-19 DIAGNOSIS — Z992 Dependence on renal dialysis: Secondary | ICD-10-CM

## 2018-10-19 SURGERY — LIGATION OF ARTERIOVENOUS  FISTULA
Anesthesia: Choice

## 2018-10-19 MED ORDER — GLYCOPYRROLATE 0.2 MG/ML IJ SOLN: 0.2000 mg | INTRAMUSCULAR | Status: DC | PRN

## 2018-10-19 MED ORDER — GLYCOPYRROLATE 0.2 MG/ML IJ SOLN
0.2000 mg | INTRAMUSCULAR | Status: DC | PRN
  Administered 2018-10-19 (×2): 0.2 mg via INTRAVENOUS
  Filled 2018-10-19 (×2): qty 1

## 2018-10-19 MED ORDER — POLYVINYL ALCOHOL 1.4 % OP SOLN
1.0000 [drp] | Freq: Four times a day (QID) | OPHTHALMIC | Status: DC | PRN
  Filled 2018-10-19: qty 15

## 2018-10-19 MED ORDER — GLYCOPYRROLATE 1 MG PO TABS: 1.0000 mg | ORAL_TABLET | ORAL | Status: DC | PRN

## 2018-10-19 MED ORDER — BIOTENE DRY MOUTH MT LIQD: 15.0000 mL | OROMUCOSAL | Status: DC | PRN

## 2018-10-19 NOTE — Progress Notes (Signed)
Renal Navigator received call back from [facility] stating that they will not be able to provide transportation for patient to isolation shift at [facility] clinic because they do not have staff at that time of night. She reports that she has spoken with patient's nephew to see if he will be able to provide transportation and is awaiting a returned call. She states he asked if patient will still have COVID 19 at discharge from hospital. Renal Navigator explained that he can speak to a doctor about his medical questions, but that it is assumed that she will still be positive for COVID 19, hence needing OP HD treatment at isolation shift at discharge until she tests negative. Renal Navigator asked that [name] call Renal Navigator back after following up with patient's nephew regarding transportation. Ms. Casper Harrison agreed. Renal Navigator will follow up with nephew directly if no call is returned by this afternoon. Renal Navigator spoke with CSW/D. Lovena Le to update her on patient's situation. Patient is not cleared for discharge until transportation to OP HD clinic has been arranged.  Alphonzo Cruise, [place] Renal Navigator [phone]

## 2018-10-19 NOTE — Progress Notes (Signed)
Renal Navigator notified by Dr. Jonnie Finner that patient has been transitioned to comfort care. Renal Navigator notified OP HD clinic/[facility] and COVID shift clinic/[facility] to provide continuity of care. Renal Navigator also notified [facility] to notify that patient no longer needs transportation arranged for OP HD treatment.   Alphonzo Cruise, [place] Renal Navigator [phone]

## 2018-10-19 NOTE — Progress Notes (Signed)
Pt pulling at facemask and groning, pt placed on 6L HFNC for comfort. Since transition, pt visually is resting more comfortably.

## 2018-10-19 NOTE — Progress Notes (Signed)
   Discussed with medicine resident patient's transition to comfort care.  At this time will not proceed with any further procedures to establish dialysis access.  If this changes please contact vascular surgery on-call.  Brandon C. Donzetta Matters, MD [facility] Office: [phone] Pager: [phone]

## 2018-10-19 NOTE — Progress Notes (Signed)
  Date: 10/26/2018  Patient name: [name]  Medical record number: [id number]  Date of birth: [birth year]   Subjective: Ms. Denio was resting in bed this morning.  She is no longer being monitored, but her RR is decreasing.  She did open her eyes and look at me when speaking to her, but she did not respond to questions.   Objective:  Vital signs in last 24 hours: Vitals:   10/14/2018 0500 10/27/2018 0600 10/24/2018 0821 10/19/2018 1400  BP:   (!) 96/52   Pulse: 86 96 83 74  Resp:  13 12 11   Temp:   98.6 F (37 C)   TempSrc:   Oral   SpO2: 96% 92% 90% (!) 60%  Weight:      Height:       General: Encephalopathic, face mask in place Eyes: Anicteric sclerae CV: RR, NR, decreased pulse in the left radial artery, extremities cool Pulm: Course upper airway sounds throughout, no wheezing Abd: NT, ND, +BS MSK: Decreased muscle tone.   Assessment/Plan:  Acute Respiratory failure with hypoxia due to COVID -19 Infection - Patient has transitioned to comfort care only, confirmed with Nephew by our night team - Morphine drip - PRN morphine for air hunger - PRN anxiolytic for anxiety - Anticipate hospital death  ESRD on hemodialysis - Patient will not undergo planned fistula procedure - Pain control with robinul  Will update family about progress.  Palliative care will be consulted if needed for further family discussions.   Dispo: Anticipated discharge in approximately 1-2 day(s).     Sid Falcon, MD 10/19/2018, 4:48 PM

## 2018-10-19 NOTE — Progress Notes (Signed)
Note patient deteriorated overnight as was made DNR and comfort care. Will sign off.    Kelly Splinter, MD 09/30/2018, 11:44 AM

## 2018-10-21 LAB — CULTURE, BLOOD (ROUTINE X 2)
Culture: NO GROWTH
Culture: NO GROWTH
Special Requests: ADEQUATE
Special Requests: ADEQUATE

## 2018-10-30 NOTE — Discharge Summary (Signed)
  Name: [name] MRN: [id number] DOB: 1931-02-09 85 y.o.  Date of Admission: 27-Oct-2018 10:33 PM Date of Death: 31-Oct-2018 Attending Physician: Gilles Chiquito, MD  Discharge Diagnosis: Acute Respiratory failure with hypoxia due to COVID -19 Infection Multifocal Pneumonia due to COVID-19 virus - [name] was admitted for hypoxic respiratory failure due to COVID 19 and multifocal pneumonia.  Unfortunately, during her hospital stay, she had increasing oxygen demands and became disoriented and agitated during the course of treatment.  Family was contacted and opted to transition to full comfort care and comfort measures were initiated.    ESRD on hemodialysis - Patient was scheduled for a ligation procedure, but this was not able to be done.  She had no way to continue receiving HD and was transitioned to comfort care given the above.       Cause of death: Multifocal pneumonia, hypoxia, COVID 19 Time of death: 0126  Disposition and follow-up:   StaceyGarrie D Long was discharged from [facility] in expired condition.    Hospital Course: [name] was admitted on 10/17/18 for worsening respiratory symptoms, hypoxia and was found to have COVID 19 related multifocal pneumonia.  She was monitored for symptoms and treated with oxygen.  She underwent HD prior to admission.  She was due to have a procedure to ligate her fistula and place another HD catheter on Monday 7/20 due to presumed steal syndrome, hand pain, etc.  This was delayed until 7/21 due to her COVID-19 positive status.  Unfortunately, she did decline in the evening of 7/20, requiring increased oxygen and developing confusion and agitation.  Family was contacted and opted to transition to comfort care for this patient.  [name] passed ~24 hours later.    Signed: Sid Falcon, MD 10/21/2018, 11:06 AM

## 2018-10-30 NOTE — Progress Notes (Addendum)
When entering room, found patient to be pulseless and not breathing. Verified no pulse and patient not breathing with second RN Annia Belt. Dr. Gilford Rile notified and asked to fill out death certificate. Time of confirmed death 0126  Dr.Winters notified me that Dr. Daryll Drown will fill out death certificate at [facility] during day. He also notified next of kin.   Body transported to morgue along with belongings that were at bedside (cell phone and charger, slippers, t-shirt).   Remainder of Morphine gtt (25ml) wasted with second RN [employer].

## 2018-10-30 DEATH — deceased

## 2019-05-20 LAB — BLOOD GAS, VENOUS
Acid-base deficit: 0.7 mmol/L (ref 0.0–2.0)
Bicarbonate: 25.3 mmol/L (ref 20.0–28.0)
FIO2: 100
O2 Saturation: 26 %
Patient temperature: 98
pCO2, Ven: 55.2 mmHg (ref 44.0–60.0)
pH, Ven: 7.281 (ref 7.250–7.430)
